# Patient Record
Sex: Female | Born: 1955 | Race: White | Hispanic: No | Marital: Married | Smoking: Never smoker
Health system: Southern US, Community
[De-identification: ages and names within clinical notes are randomized; demographics above are authoritative.]

## PROBLEM LIST (undated history)

## (undated) DIAGNOSIS — N390 Urinary tract infection, site not specified: Secondary | ICD-10-CM

## (undated) DIAGNOSIS — G629 Polyneuropathy, unspecified: Secondary | ICD-10-CM

## (undated) DIAGNOSIS — Z9889 Other specified postprocedural states: Secondary | ICD-10-CM

## (undated) DIAGNOSIS — I499 Cardiac arrhythmia, unspecified: Secondary | ICD-10-CM

## (undated) DIAGNOSIS — N1370 Vesicoureteral-reflux, unspecified: Secondary | ICD-10-CM

## (undated) DIAGNOSIS — R5383 Other fatigue: Secondary | ICD-10-CM

## (undated) DIAGNOSIS — M1990 Unspecified osteoarthritis, unspecified site: Secondary | ICD-10-CM

## (undated) DIAGNOSIS — R000 Tachycardia, unspecified: Secondary | ICD-10-CM

## (undated) DIAGNOSIS — E039 Hypothyroidism, unspecified: Secondary | ICD-10-CM

## (undated) DIAGNOSIS — R0602 Shortness of breath: Secondary | ICD-10-CM

## (undated) DIAGNOSIS — E663 Overweight: Secondary | ICD-10-CM

## (undated) DIAGNOSIS — I10 Essential (primary) hypertension: Secondary | ICD-10-CM

## (undated) DIAGNOSIS — T4145XA Adverse effect of unspecified anesthetic, initial encounter: Secondary | ICD-10-CM

## (undated) DIAGNOSIS — M79606 Pain in leg, unspecified: Secondary | ICD-10-CM

## (undated) DIAGNOSIS — K219 Gastro-esophageal reflux disease without esophagitis: Secondary | ICD-10-CM

## (undated) DIAGNOSIS — T8859XA Other complications of anesthesia, initial encounter: Secondary | ICD-10-CM

## (undated) DIAGNOSIS — R079 Chest pain, unspecified: Secondary | ICD-10-CM

## (undated) DIAGNOSIS — R112 Nausea with vomiting, unspecified: Secondary | ICD-10-CM

## (undated) DIAGNOSIS — R05 Cough: Secondary | ICD-10-CM

## (undated) DIAGNOSIS — E079 Disorder of thyroid, unspecified: Secondary | ICD-10-CM

## (undated) DIAGNOSIS — G479 Sleep disorder, unspecified: Secondary | ICD-10-CM

## (undated) DIAGNOSIS — Z87442 Personal history of urinary calculi: Secondary | ICD-10-CM

## (undated) DIAGNOSIS — G8929 Other chronic pain: Secondary | ICD-10-CM

## (undated) HISTORY — DX: Polyneuropathy, unspecified: G62.9

## (undated) HISTORY — DX: Tachycardia, unspecified: R00.0

## (undated) HISTORY — PX: KNEE SURGERY: SHX244

## (undated) HISTORY — DX: Overweight: E66.3

## (undated) HISTORY — DX: Vesicoureteral-reflux, unspecified: N13.70

## (undated) HISTORY — DX: Cough: R05

## (undated) HISTORY — DX: Chest pain, unspecified: R07.9

## (undated) HISTORY — DX: Other chronic pain: G89.29

## (undated) HISTORY — PX: TONSILLECTOMY: SUR1361

## (undated) HISTORY — DX: Shortness of breath: R06.02

## (undated) HISTORY — DX: Unspecified osteoarthritis, unspecified site: M19.90

## (undated) HISTORY — DX: Urinary tract infection, site not specified: N39.0

## (undated) HISTORY — DX: Other fatigue: R53.83

## (undated) HISTORY — DX: Pain in leg, unspecified: M79.606

## (undated) HISTORY — DX: Sleep disorder, unspecified: G47.9

---

## 1991-01-07 HISTORY — PX: KIDNEY SURGERY: SHX687

## 1992-01-07 HISTORY — PX: URETEROSCOPY VIA URETEROSTOMY: SUR447

## 1992-01-07 HISTORY — PX: OOPHORECTOMY: SHX86

## 1992-01-07 HISTORY — PX: ABDOMINAL SURGERY: SHX537

## 2007-01-07 HISTORY — PX: FOOT SURGERY: SHX648

## 2012-09-30 ENCOUNTER — Ambulatory Visit: Payer: Self-pay | Admitting: Internal Medicine

## 2012-11-09 ENCOUNTER — Ambulatory Visit: Payer: Self-pay | Admitting: Internal Medicine

## 2013-06-06 DIAGNOSIS — R000 Tachycardia, unspecified: Secondary | ICD-10-CM

## 2013-06-06 DIAGNOSIS — R0602 Shortness of breath: Secondary | ICD-10-CM

## 2013-06-06 DIAGNOSIS — G8929 Other chronic pain: Secondary | ICD-10-CM

## 2013-06-06 DIAGNOSIS — R079 Chest pain, unspecified: Secondary | ICD-10-CM

## 2013-06-06 DIAGNOSIS — R058 Other specified cough: Secondary | ICD-10-CM

## 2013-06-06 HISTORY — DX: Other chronic pain: G89.29

## 2013-06-06 HISTORY — DX: Other specified cough: R05.8

## 2013-06-06 HISTORY — DX: Tachycardia, unspecified: R00.0

## 2013-06-06 HISTORY — DX: Shortness of breath: R06.02

## 2013-06-06 HISTORY — DX: Chest pain, unspecified: R07.9

## 2013-06-17 ENCOUNTER — Emergency Department (HOSPITAL_COMMUNITY): Payer: BC Managed Care – PPO

## 2013-06-17 ENCOUNTER — Encounter (HOSPITAL_COMMUNITY): Payer: Self-pay | Admitting: Emergency Medicine

## 2013-06-17 ENCOUNTER — Emergency Department (HOSPITAL_COMMUNITY)
Admission: EM | Admit: 2013-06-17 | Discharge: 2013-06-17 | Disposition: A | Discharge status: Home / Self Care | Payer: BC Managed Care – PPO | Attending: Emergency Medicine | Admitting: Emergency Medicine

## 2013-06-17 DIAGNOSIS — R002 Palpitations: Secondary | ICD-10-CM | POA: Insufficient documentation

## 2013-06-17 DIAGNOSIS — R000 Tachycardia, unspecified: Secondary | ICD-10-CM

## 2013-06-17 DIAGNOSIS — E039 Hypothyroidism, unspecified: Secondary | ICD-10-CM | POA: Insufficient documentation

## 2013-06-17 DIAGNOSIS — Z880 Allergy status to penicillin: Secondary | ICD-10-CM | POA: Insufficient documentation

## 2013-06-17 DIAGNOSIS — Z79899 Other long term (current) drug therapy: Secondary | ICD-10-CM | POA: Insufficient documentation

## 2013-06-17 HISTORY — DX: Disorder of thyroid, unspecified: E07.9

## 2013-06-17 LAB — CBC
HEMATOCRIT: 43 % (ref 36.0–46.0)
HEMOGLOBIN: 14.6 g/dL (ref 12.0–15.0)
MCH: 31.5 pg (ref 26.0–34.0)
MCHC: 34 g/dL (ref 30.0–36.0)
MCV: 92.7 fL (ref 78.0–100.0)
Platelets: 254 10*3/uL (ref 150–400)
RBC: 4.64 MIL/uL (ref 3.87–5.11)
RDW: 12.9 % (ref 11.5–15.5)
WBC: 9.4 10*3/uL (ref 4.0–10.5)

## 2013-06-17 LAB — COMPREHENSIVE METABOLIC PANEL
ALBUMIN: 3.8 g/dL (ref 3.5–5.2)
ALK PHOS: 82 U/L (ref 39–117)
ALT: 26 U/L (ref 0–35)
AST: 25 U/L (ref 0–37)
BUN: 16 mg/dL (ref 6–23)
CHLORIDE: 104 meq/L (ref 96–112)
CO2: 26 mEq/L (ref 19–32)
Calcium: 9.9 mg/dL (ref 8.4–10.5)
Creatinine, Ser: 0.55 mg/dL (ref 0.50–1.10)
GFR calc non Af Amer: >90 mL/min (ref >90)
Glucose, Bld: 99 mg/dL (ref 70–99)
Potassium: 4.2 mEq/L (ref 3.7–5.3)
Sodium: 144 mEq/L (ref 137–147)
Total Bilirubin: 0.3 mg/dL (ref 0.3–1.2)
Total Protein: 7 g/dL (ref 6.0–8.3)

## 2013-06-17 LAB — I-STAT TROPONIN, ED
TROPONIN I, POC: 0 ng/mL (ref 0.00–0.08)
Troponin i, poc: 0 ng/mL (ref 0.00–0.08)

## 2013-06-17 LAB — D-DIMER, QUANTITATIVE: D-Dimer, Quant: <0.27 ug/mL-FEU (ref 0.00–0.48)

## 2013-06-17 MED ORDER — ASPIRIN 81 MG PO CHEW
324.0000 mg | CHEWABLE_TABLET | Freq: Once | ORAL | Status: AC
  Administered 2013-06-17: 324 mg via ORAL
  Filled 2013-06-17: qty 4

## 2013-06-17 NOTE — ED Provider Notes (Addendum)
CSN: [id number]     Arrival date & time 06/17/13  0800 History   First MD Initiated Contact with Patient 06/17/13 [phone]     Chief Complaint  Patient presents with  . Chest Pain     (Consider location/radiation/quality/duration/timing/severity/associated sxs/prior Treatment) HPI  Patient presents to ED for left sided chest pain.  She states she awoke this morning, got up to feed her dog, then went back to sleep around 5:30 am.  After 10-30 minutes of rest, she felt her heart beat very fast with palpitations.  She states it felt uncomfortable, like she just did vigorous exercise.  She then had sharp pain in her left chest lasting 5-10 minutes.  After the pain resolved, she felt continued chest pressure, and shooting left arm pain.  She had some shortness of breath, and non-productive cough.  No diaphoresis or dizziness.  No pain with inspiration.   She has been chronically on estrogen therapy x 6 years, and states at one point her levels were 3 x normal.  She is still currently on treatment.  She notes no unilateral leg swelling, but has chronic leg pain and does not exercise often.  She has no history of HTN, hyperlipidemia.  Her family history is significant for heart disease in her mother and maternal grandfather.  She also notes multiple cousins that had heart attacks before 56 years old.   Past Medical History  Diagnosis Date  . Thyroid disease     hypo   Past Surgical History  Procedure Laterality Date  . Knee surgery    . Foot surgery    . Oophorectomy      left   No family history on file. History  Substance Use Topics  . Smoking status: Never Smoker   . Smokeless tobacco: Not on file  . Alcohol Use: 1.8 oz/week    3 Glasses of wine per week   OB History   Grav Para Term Preterm Abortions TAB SAB Ect Mult Living                 Review of Systems  Ten systems reviewed and are negative for acute change, except as noted in the HPI.    Allergies  Penicillins and Sulfa  antibiotics  Home Medications   Prior to Admission medications   Medication Sig Start Date End Date Taking? Authorizing Provider  cholecalciferol (VITAMIN D) 1000 UNITS tablet Take 2,000 Units by mouth daily.   Yes Historical Provider, MD  ergocalciferol (VITAMIN D2) 50000 UNITS capsule Take 50,000 Units by mouth once a week.   Yes Historical Provider, MD  Estradiol-Estriol-Progesterone (BIEST/PROGESTERONE) CREA Place 1 application onto the skin 2 (two) times daily.   Yes Historical Provider, MD  Flaxseed, Linseed, (FLAXSEED OIL PO) Take 3 capsules by mouth daily.   Yes Historical Provider, MD  ibuprofen (ADVIL,MOTRIN) 200 MG tablet Take 400 mg by mouth every 6 (six) hours as needed for moderate pain.   Yes Historical Provider, MD  levothyroxine (SYNTHROID, LEVOTHROID) 112 MCG tablet Take 112 mcg by mouth daily before breakfast.   Yes Historical Provider, MD  magnesium gluconate (MAGONATE) 500 MG tablet Take 1,000 mg by mouth daily.   Yes Historical Provider, MD  Omega-3 Fatty Acids (FISH OIL PO) Take 2 capsules by mouth daily.   Yes Historical Provider, MD  Progesterone Micronized (PROGESTERONE PO) Take 150 mg by mouth daily.   Yes Historical Provider, MD  Progesterone Micronized 10 % CREA Place 1 application onto the skin daily.  Yes Historical Provider, MD   BP 158/82  Pulse 84  Temp(Src) 97.8 F (36.6 C) (Oral)  Resp 13  SpO2 99% Physical Exam Physical Exam  Nursing note and vitals reviewed. Constitutional: She is oriented to person, place, and time. She appears well-developed and well-nourished. No distress.  HENT:  Head: Normocephalic and atraumatic.  Eyes: Conjunctivae normal and EOM are normal. Pupils are equal, round, and reactive to light. No scleral icterus.  Neck: Normal range of motion.  Cardiovascular: Normal rate, regular rhythm and normal heart sounds.  Exam reveals no gallop and no friction rub. DP/PT pulses intact. No calf swelling or tenderness. Negative  Homan's. No murmur heard. Pulmonary/Chest: Effort normal and breath sounds normal. No respiratory distress.  Abdominal: Soft. Bowel sounds are normal. She exhibits no distension and no mass. There is no tenderness. There is no guarding.  Neurological: She is alert and oriented to person, place, and time.  Skin: Skin is warm and dry. She is not diaphoretic.    ED Course  Procedures (including critical care time) Labs Review Labs Reviewed  CBC  COMPREHENSIVE METABOLIC PANEL  Rosezena SensorI-STAT TROPOININ, ED    Imaging Review Dg Chest 2 View  06/17/2013   CLINICAL DATA:  Left-sided chest pain radiating into the left upper extremity. Tachycardia. Former smoker who quit approximately 30 years ago.  EXAM: CHEST  2 VIEW  COMPARISON:  None.  FINDINGS: Cardiac silhouette normal in size. Thoracic aorta mildly tortuous and elongated. Hilar and mediastinal contours otherwise unremarkable. Mild to moderate central peribronchial thickening. Lungs otherwise clear. No localized airspace consolidation. No pleural effusions. No pneumothorax. Normal pulmonary vascularity. Mild degenerative changes involving the thoracic spine.  IMPRESSION: Mild to moderate changes of bronchitis and/or asthma (which could be acute or chronic) without localized airspace pneumonia.   Electronically Signed   By: Hulan Saashomas  Lawrence M.D.   On: 06/17/2013 09:11     EKG Interpretation   Date/Time:  Friday June 17 2013 08:09:42 EDT Ventricular Rate:  82 PR Interval:  190 QRS Duration: 79 QT Interval:  348 QTC Calculation: 406 R Axis:     Text Interpretation:  Sinus rhythm Baseline wander in lead(s) II III aVR  aVF V6 Confirmed by WARD,  DO, KRISTEN ([id number]) on 06/17/2013 8:23:39 AM      MDM   Final diagnoses:  Racing heart beat    10:15 AM Patient troponin negative. Labs unremarkable. ? SVT. Wells low risk but cannot PERC. D-dimer is pending.   12:12 PM BP 153/52  Pulse 73  Temp(Src) 97.8 F (36.6 C) (Oral)  Resp 11   SpO2 97% Patient with negative d-dimer. 2 negative troponins. Feel that patient may have had a run of svt or other tachyarrhythmia.   Patient is to be discharged with recommendation to follow up with PCP in regards to today's hospital visit. D-dimer negative, VSS, no tracheal deviation, no JVD or new murmur, RRR, breath sounds equal bilaterally, EKG without acute abnormalities, negative troponin, and negative CXR. Return to the ED if CP becomes exertional, associated with diaphoresis or nausea, radiates to left jaw/arm, worsens or becomes concerning in any way. Pt appears reliable for follow up and is agreeable to discharge.   Case has been discussed with and seen by Dr. Elesa MassedWard who agrees with the above plan to discharge.       Arthor Captainbigail Cheyanne Lamison, PA-C 06/17/13 2128  Arthor CaptainAbigail Duron Meister, PA-C 07/12/13 [phone]

## 2013-06-17 NOTE — Discharge Instructions (Signed)
Nonspecific Tachycardia Tachycardia is a faster than normal heartbeat (more than 100 beats per minute). In adults, the heart normally beats between 60 and 100 times a minute. A fast heartbeat may be a normal response to exercise or stress. It does not necessarily mean that something is wrong. However, sometimes when your heart beats too fast it may not be able to pump enough blood to the rest of your body. This can result in chest pain, shortness of breath, dizziness, and even fainting. Nonspecific tachycardia means that the specific cause or pattern of your tachycardia is unknown. CAUSES  Tachycardia may be harmless or it may be due to a more serious underlying cause. Possible causes of tachycardia include:  Exercise or exertion.  Fever.  Pain or injury.  Infection.  Loss of body fluids (dehydration).  Overactive thyroid.  Lack of red blood cells (anemia).  Anxiety and stress.  Alcohol.  Caffeine.  Tobacco products.  Diet pills.  Illegal drugs.  Heart disease. SYMPTOMS  Rapid or irregular heartbeat (palpitations).  Suddenly feeling your heart beating (cardiac awareness).  Dizziness.  Tiredness (fatigue).  Shortness of breath.  Chest pain.  Nausea.  Fainting. DIAGNOSIS  Your caregiver will perform a physical exam and take your medical history. In some cases, a heart specialist (cardiologist) may be consulted. Your caregiver may also order:  Blood tests.  Electrocardiography. This test records the electrical activity of your heart.  A heart monitoring test. TREATMENT  Treatment will depend on the likely cause of your tachycardia. The goal is to treat the underlying cause of your tachycardia. Treatment methods may include:  Replacement of fluids or blood through an intravenous (IV) tube for moderate to severe dehydration or anemia.  New medicines or changes in your current medicines.  Diet and lifestyle changes.  Treatment for certain  infections.  Stress relief or relaxation methods. HOME CARE INSTRUCTIONS   Rest.  Drink enough fluids to keep your urine clear or pale yellow.  Do not smoke.  Avoid:  Caffeine.  Tobacco.  Alcohol.  Chocolate.  Stimulants such as over-the-counter diet pills or pills that help you stay awake.  Situations that cause anxiety or stress.  Illegal drugs such as marijuana, phencyclidine (PCP), and cocaine.  Only take medicine as directed by your caregiver.  Keep all follow-up appointments as directed by your caregiver. SEEK IMMEDIATE MEDICAL CARE IF:   You have pain in your chest, upper arms, jaw, or neck.  You become weak, dizzy, or feel faint.  You have palpitations that will not go away.  You vomit, have diarrhea, or pass blood in your stool.  Your skin is cool, pale, and wet.  You have a fever that will not go away with rest, fluids, and medicine. MAKE SURE YOU:   Understand these instructions.  Will watch your condition.  Will get help right away if you are not doing well or get worse. Document Released: 01/31/2004 Document Revised: 03/17/2011 Document Reviewed: 12/03/2010 [facility] Patient Information 2014 [place]ExitCare, [place].   Holter Monitoring A Holter monitor is a small device with electrodes (small sticky patches) that attach to your chest. It records the electrical activity of your heart and is worn continuously for 24-48 hours.  A HOLTER MONITOR IS USED TO  Detect heart problems such as:  Heart arrhythmia. Is an abnormal or irregular heartbeat. With some heart arrhythmias, you may not feel or know that you have an irregular heart rhythm.  Palpitations, such as feeling your heart racing or fluttering. It is  possible to have heart palpitations and not have a heart arrhythmia.  A heart rhythm that is too slow or too fast.  If you have problems fainting, near fainting or feeling light-headed, a Holter monitor may be worn to see if your heart is the  cause. HOLTER MONITOR PREPARATION   Electrodes will be attached to the skin on your chest.  If you have hair on your chest, small areas may have to be shaved. This is done to help the patches stick better and make the recording more accurate.  The electrodes are attached by wires to the Holter monitor. The Holter monitor clips to your clothing. You will wear the monitor at all times, even while exercising and sleeping. HOME CARE INSTRUCTIONS   Wear your monitor at all times.  The wires and the monitor must stay dry. Do not get the monitor wet.  Do not bathe, swim or use a hot tub with it on.  You may do a "sponge" bath while you have the monitor on.  Keep your skin clean, do not put body lotion or moisturizer on your chest.  It's possible that your skin under the electrodes could become irritated. To keep this from happening, you may put the electrodes in slightly different places on your chest.  Your caregiver will also ask you to keep a diary of your activities, such as walking or doing chores. Be sure to note what you are doing if you experience heart symptoms such as palpitations. This will help your caregiver determine what might be contributing to your symptoms. The information stored in your monitor will be reviewed by your caregiver alongside your diary entries.  Make sure the monitor is safely clipped to your clothing or in a location close to your body that your caregiver recommends.  The monitor and electrodes are removed when the test is over. Return the monitor as directed.  Be sure to follow up with your caregiver and discuss your Holter monitor results. SEEK IMMEDIATE MEDICAL CARE IF:  You faint or feel lightheaded.  You have trouble breathing.  You get pain in your chest, upper arm or jaw.  You feel sick to your stomach and your skin is pale, cool, or damp.  You think something is wrong with the way your heart is beating. MAKE SURE YOU:   Understand these  instructions.  Will watch your condition.  Will get help right away if you are not doing well or get worse. Document Released: 09/21/2003 Document Revised: 03/17/2011 Document Reviewed: 02/03/2008 [facility] Patient Information 2014 [place], [place].

## 2013-06-17 NOTE — ED Notes (Signed)
Pt states that she began having an "arhythmia" while laying in bed this morning at approx 6am. Pt states that she also had sharp pains under her left breast and felt like she could not catch her breath. . Pt states that she was feeling nauseated yesterday. Pt states that symptoms have decreased and now just feels " a presence" of the pain left. Pt also reports having a dry cough since yesterday.

## 2013-06-17 NOTE — ED Notes (Signed)
X-Ray notified pt available for transport.

## 2013-06-20 NOTE — ED Provider Notes (Signed)
Medical screening examination/treatment/procedure(s) were performed by non-physician practitioner and as supervising physician I was immediately available for consultation/collaboration.   EKG Interpretation   Date/Time:  Friday June 17 2013 08:09:42 EDT Ventricular Rate:  82 PR Interval:  190 QRS Duration: 79 QT Interval:  348 QTC Calculation: 406 R Axis:     Text Interpretation:  Sinus rhythm Baseline wander in lead(s) II III aVR  aVF V6 Confirmed by WARD,  DO, KRISTEN ([id number]) on 06/17/2013 8:23:39 AM        Layla MawKristen N Ward, DO 06/20/13 1507

## 2013-07-12 NOTE — ED Provider Notes (Signed)
Medical screening examination/treatment/procedure(s) were performed by non-physician practitioner and as supervising physician I was immediately available for consultation/collaboration.   EKG Interpretation   Date/Time:  Friday June 17 2013 08:09:42 EDT Ventricular Rate:  82 PR Interval:  190 QRS Duration: 79 QT Interval:  348 QTC Calculation: 406 R Axis:     Text Interpretation:  Sinus rhythm Baseline wander in lead(s) II III aVR  aVF V6 Confirmed by WARD,  DO, KRISTEN ([id number]) on 06/17/2013 8:23:39 AM        Layla MawKristen N Ward, DO 07/12/13 [phone]

## 2013-07-19 ENCOUNTER — Ambulatory Visit (INDEPENDENT_AMBULATORY_CARE_PROVIDER_SITE_OTHER): Payer: BC Managed Care – PPO | Admitting: Internal Medicine

## 2013-07-19 ENCOUNTER — Encounter: Payer: Self-pay | Admitting: Internal Medicine

## 2013-07-19 VITALS — BP 150/74 | HR 94 | Ht 67.0 in | Wt 200.0 lb

## 2013-07-19 DIAGNOSIS — R079 Chest pain, unspecified: Secondary | ICD-10-CM

## 2013-07-19 DIAGNOSIS — R0602 Shortness of breath: Secondary | ICD-10-CM

## 2013-07-19 DIAGNOSIS — R002 Palpitations: Secondary | ICD-10-CM

## 2013-07-19 NOTE — Assessment & Plan Note (Signed)
The most likely etiology of her symptoms is PAF. We will have her undergo a 30 day monitor and hopefully will be able to diagnose the mechanism of her symptoms. Because of associated c/p and sob with her symptoms, she will be scheduled for a 2D echo. I will see her back in 6-8 weeks. I have outlined the possible etiology of her symptoms.

## 2013-07-19 NOTE — Patient Instructions (Signed)
Your physician recommends that you schedule a follow-up appointment in: 6-8 weeks with Dr Ladona Ridgelaylor  Your physician has requested that you have an echocardiogram. Echocardiography is a painless test that uses sound waves to create images of your heart. It provides your doctor with information about the size and shape of your heart and how well your heart's chambers and valves are working. This procedure takes approximately one hour. There are no restrictions for this procedure.   Your physician has recommended that you wear an event monitor. Event monitors are medical devices that record the heart's electrical activity. Doctors most often us these monitors to diagnose arrhythmias. Arrhythmias are problems with the speed or rhythm of the heartbeat. The monitor is a small, portable device. You can wear one while you do your normal daily activities. This is usually used to diagnose what is causing palpitations/syncope (passing out).

## 2013-07-19 NOTE — Progress Notes (Signed)
HPI Marissa Larson is referred today for ongoing evaluation of palpitations. She is a pleasant 58 yo woman with a h/o thyroid dysfunction and obesity who has developed symptomatic tachypalpitations. The patient states that she has had a couple of episodes remotely but was in her usual state of health when she awoke with palpitations which she describes as being very irregular, associated with chest pressure and sob. Also arm pain was present. She presented to the ER where her palpitations but not her chest pain were resolved. She was discharge home. Since then she has had a few very brief episodes. Her initial evaluation was negative. She has had no cardiac evaluation recently Her family history is notable for atrial fibrillation and strokes. No syncope. No edema. Her health has otherwise been good. Allergies  Allergen Reactions  . Penicillins Hives and Itching  . Sulfa Antibiotics Hives and Itching    Bactrim     Current Outpatient Prescriptions  Medication Sig Dispense Refill  . cholecalciferol (VITAMIN D) 1000 UNITS tablet Take 2,000 Units by mouth daily.      . ergocalciferol (VITAMIN D2) 50000 UNITS capsule Take 50,000 Units by mouth once a week.      . Estradiol-Estriol-Progesterone (BIEST/PROGESTERONE) CREA Place 1 application onto the skin 2 (two) times daily.      . eszopiclone (LUNESTA) 2 MG TABS tablet as directed.      . Flaxseed, Linseed, (FLAXSEED OIL PO) Take 3 capsules by mouth daily.      Marland Kitchen ibuprofen (ADVIL,MOTRIN) 200 MG tablet Take 400 mg by mouth every 6 (six) hours as needed for moderate pain.      Marland Kitchen levothyroxine (SYNTHROID, LEVOTHROID) 112 MCG tablet Take 112 mcg by mouth daily before breakfast.      . magnesium gluconate (MAGONATE) 500 MG tablet Take 1,000 mg by mouth daily.      . Omega-3 Fatty Acids (FISH OIL PO) Take 2 capsules by mouth daily.      . Progesterone Micronized (PROGESTERONE PO) Take 150 mg by mouth daily.      . Progesterone Micronized 10 % CREA  Place 1 application onto the skin daily.       No current facility-administered medications for this visit.     Past Medical History  Diagnosis Date  . Thyroid disease     hypo  . Hiatal hernia   . UTI (lower urinary tract infection)   . Arthritis   . Fatigue   . Sleep difficulties     Change in sleep patterns  . Tachycardia 06/2013  . Chest pain 06/2013    Left sided  . SOB (shortness of breath) 06/2013  . Chronic leg pain 06/2013  . Nonproductive cough 06/2013    ROS:   All systems reviewed and negative except as noted in the HPI.   Past Surgical History  Procedure Laterality Date  . Knee surgery      Multiple knee surgeries  . Foot surgery Right 2009  . Oophorectomy Left 1994  . Abdominal surgery  1994  . Kidney surgery Left 1993     Family History  Problem Relation Age of Onset  . Migraines Other     Fam Hx   . Heart disease Other     Fam Hx  . Thyroid disease Other     Fam Hx - She also has thyroid disease  . Diabetes Other     Fam Hx  . Arthritis Other     Fam Hx  .  Gallbladder disease Other     Fam Hx  . Cancer Other     Fam Hx - Pancreatic  . Hypertension Other     Fam Hx  . Heart attack Cousin     Multiple cousins with MI's before 56 years of age  . Heart disease Mother   . Heart disease Maternal Grandmother      History   Social History  . Marital Status: Married    Spouse Name: N/A    Number of Children: N/A  . Years of Education: N/A   Occupational History  . Not on file.   Social History Main Topics  . Smoking status: Never Smoker   . Smokeless tobacco: Not on file  . Alcohol Use: 1.8 oz/week    3 Glasses of wine per week  . Drug Use: No  . Sexual Activity: Not on file   Other Topics Concern  . Not on file   Social History Narrative  . No narrative on file     BP 150/74  Pulse 94  Ht 5\' 7"  (1.702 m)  Wt 200 lb (90.719 kg)  BMI 31.32 kg/m2  Physical Exam:  Well appearing obese, middle aged woman, NAD HEENT:  Unremarkable Neck:  No JVD, no thyromegally Back:  No CVA tenderness Lungs:  Clear with no wheezes, rales, or rhonchi. HEART:  Regular rate rhythm, no murmurs, no rubs, no clicks Abd:  soft, positive bowel sounds, no organomegally, no rebound, no guarding Ext:  2 plus pulses, no edema, no cyanosis, no clubbing Skin:  No rashes no nodules Neuro:  CN II through XII intact, motor grossly intact  EKG - nsr  Assess/Plan:

## 2013-07-20 ENCOUNTER — Telehealth: Payer: Self-pay | Admitting: Internal Medicine

## 2013-07-20 ENCOUNTER — Encounter: Payer: Self-pay | Admitting: *Deleted

## 2013-07-20 ENCOUNTER — Encounter (INDEPENDENT_AMBULATORY_CARE_PROVIDER_SITE_OTHER): Payer: BC Managed Care – PPO

## 2013-07-20 DIAGNOSIS — R002 Palpitations: Secondary | ICD-10-CM

## 2013-07-20 NOTE — Telephone Encounter (Signed)
Walk In pt Form " Sealed Envelope" [facility]

## 2013-07-20 NOTE — Progress Notes (Signed)
Patient ID: Marissa Larson, female   DOB: 12-13-1955, 58 y.o.   MRN: [id number] E-Cardio verite 30 day cardiac event monitor applied to patient.

## 2013-07-21 ENCOUNTER — Ambulatory Visit (HOSPITAL_COMMUNITY)
Admission: RE | Admit: 2013-07-21 | Discharge: 2013-07-21 | Disposition: A | Discharge status: Home / Self Care | Payer: BC Managed Care – PPO | Source: Ambulatory Visit | Attending: Cardiology | Admitting: Cardiology

## 2013-07-21 DIAGNOSIS — R002 Palpitations: Secondary | ICD-10-CM | POA: Insufficient documentation

## 2013-07-21 DIAGNOSIS — I059 Rheumatic mitral valve disease, unspecified: Secondary | ICD-10-CM

## 2013-07-21 DIAGNOSIS — R079 Chest pain, unspecified: Secondary | ICD-10-CM

## 2013-07-21 DIAGNOSIS — R0602 Shortness of breath: Secondary | ICD-10-CM

## 2013-07-21 NOTE — Progress Notes (Signed)
2D Echo Performed 07/21/2013    Loyce Klasen, RCS  

## 2013-08-12 ENCOUNTER — Encounter: Payer: BC Managed Care – PPO | Admitting: Cardiology

## 2013-08-30 ENCOUNTER — Encounter: Payer: Self-pay | Admitting: Internal Medicine

## 2013-08-30 ENCOUNTER — Ambulatory Visit (INDEPENDENT_AMBULATORY_CARE_PROVIDER_SITE_OTHER): Payer: BC Managed Care – PPO | Admitting: Internal Medicine

## 2013-08-30 VITALS — BP 132/78 | HR 81 | Ht 67.0 in | Wt 200.4 lb

## 2013-08-30 DIAGNOSIS — R002 Palpitations: Secondary | ICD-10-CM

## 2013-08-30 NOTE — Patient Instructions (Signed)
Your physician recommends that you schedule a follow-up appointment in: 3 months with Dr Ladona Ridgel  Will call us back at [phone] in 4 weeks to report how you are doing  Will start Flecainide  twice daily if symptoms are still persistent

## 2013-08-30 NOTE — Assessment & Plan Note (Signed)
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 We discussed the treatment options in detail. She does note a lack of sleep, and increase stress. We discussed initiation of calcium channel blockers, beta blockers, or flecainide. After careful review of the side effects of each medication, she will undergo a period of watchful waiting while she is trying to adjust first child life, and if she has recurrent palpitations, she will initiate flecainide therapy, initially at 50 mg twice daily, with up titration thereafter.

## 2013-08-30 NOTE — Progress Notes (Signed)
HPI Marissa Larson returns today for followup. She is a very pleasant 58 year old woman with a history of fatigue and malaise and palpitations. When I saw the patient several weeks ago, I recommended that she undergo 2-D echocardiography, as well as cardiac monitoring. She was found to have preserved left ventricular systolic function. On cardiac monitoring, she was found to have frequent PVCs, as well as nonsustained ventricular tachycardia. Her symptoms persist. We discussed the possible contributing factors, she notes that she has poor sleep hygiene, and will frequently have very little if any sleep. She has frequent palpitations. She denies anginal symptoms. She states that her thyroid functions have been evaluated, and found to be unremarkable. Allergies  Allergen Reactions  . Penicillins Hives and Itching  . Sulfa Antibiotics Hives and Itching    Bactrim     Current Outpatient Prescriptions  Medication Sig Dispense Refill  . cholecalciferol (VITAMIN D) 1000 UNITS tablet Take 2,000 Units by mouth daily.      . ergocalciferol (VITAMIN D2) 50000 UNITS capsule Take 50,000 Units by mouth once a week. Takes on Sunday      . Estradiol-Estriol-Progesterone (BIEST/PROGESTERONE) CREA Place 1 application onto the skin 2 (two) times daily.      . eszopiclone (LUNESTA) 2 MG TABS tablet Take 2 mg by mouth at bedtime as needed for sleep.       . Flaxseed, Linseed, (FLAXSEED OIL PO) Take 3 capsules by mouth daily.      Marland Kitchen ibuprofen (ADVIL,MOTRIN) 200 MG tablet Take 400 mg by mouth every 6 (six) hours as needed for moderate pain.      Marland Kitchen levothyroxine (SYNTHROID, LEVOTHROID) 112 MCG tablet Take 112 mcg by mouth daily before breakfast.      . magnesium gluconate (MAGONATE) 500 MG tablet Take 1,000 mg by mouth daily.      . Omega-3 Fatty Acids (FISH OIL PO) Take 2 capsules by mouth daily.      . Progesterone Micronized (PROGESTERONE PO) Take 150 mg by mouth daily.      . Progesterone Micronized 10 %  CREA Place 1 application onto the skin daily.       No current facility-administered medications for this visit.     Past Medical History  Diagnosis Date  . Thyroid disease     hypo  . Hiatal hernia   . UTI (lower urinary tract infection)   . Arthritis   . Fatigue   . Sleep difficulties     Change in sleep patterns  . Tachycardia 06/2013  . Chest pain 06/2013    Left sided  . SOB (shortness of breath) 06/2013  . Chronic leg pain 06/2013  . Nonproductive cough 06/2013    ROS:   All systems reviewed and negative except as noted in the HPI.   Past Surgical History  Procedure Laterality Date  . Knee surgery      Multiple knee surgeries  . Foot surgery Right 2009  . Oophorectomy Left 1994  . Abdominal surgery  1994  . Kidney surgery Left 1993     Family History  Problem Relation Age of Onset  . Migraines Other     Fam Hx   . Heart disease Other     Fam Hx  . Thyroid disease Other     Fam Hx - She also has thyroid disease  . Diabetes Other     Fam Hx  . Arthritis Other     Fam Hx  . Gallbladder disease  Other     Fam Hx  . Cancer Other     Fam Hx - Pancreatic  . Hypertension Other     Fam Hx  . Heart attack Cousin     Multiple cousins with MI's before 37 years of age  . Heart disease Mother   . Heart disease Maternal Grandmother      History   Social History  . Marital Status: Married    Spouse Name: N/A    Number of Children: N/A  . Years of Education: N/A   Occupational History  . Not on file.   Social History Main Topics  . Smoking status: Never Smoker   . Smokeless tobacco: Not on file  . Alcohol Use: 1.8 oz/week    3 Glasses of wine per week  . Drug Use: No  . Sexual Activity: Not on file   Other Topics Concern  . Not on file   Social History Narrative  . No narrative on file     BP 132/78  Pulse 81  Ht  (1.702 m)  Wt 200 lb 6.4 oz (90.901 kg)  BMI 31.38 kg/m2  Physical Exam:  Well appearing 58 year old woman,  NAD HEENT: Unremarkable Neck:  No JVD, no thyromegally Back:  No CVA tenderness Lungs:  Clear with no wheezes, rales, or rhonchi. HEART:  Regular rate rhythm, no murmurs, no rubs, no clicks Abd:  soft, positive bowel sounds, no organomegally, no rebound, no guarding Ext:  2 plus pulses, no edema, no cyanosis, no clubbing Skin:  No rashes no nodules Neuro:  CN II through XII intact, motor grossly intact  Cardiac monitoring - normal sinus rhythm with PVCs, and nonsustained VT.  DEVICE  Normal device function.  See PaceArt for details.   Assess/Plan:

## 2013-08-31 ENCOUNTER — Telehealth: Payer: Self-pay | Admitting: Internal Medicine

## 2013-08-31 NOTE — Telephone Encounter (Signed)
New message     Talk to a nurse regarding yesterday's appointment

## 2013-08-31 NOTE — Telephone Encounter (Addendum)
Spoke with patient and she still has a lot of questions for Dr Ladona Ridgel.  I will set up an appointment with Dr Ladona Ridgel so she and her husband can come and have their questions answered

## 2013-09-15 ENCOUNTER — Ambulatory Visit: Payer: BC Managed Care – PPO | Admitting: Internal Medicine

## 2013-09-20 ENCOUNTER — Encounter: Payer: Self-pay | Admitting: Internal Medicine

## 2013-09-20 ENCOUNTER — Ambulatory Visit: Payer: BC Managed Care – PPO | Admitting: Internal Medicine

## 2013-09-20 ENCOUNTER — Ambulatory Visit (INDEPENDENT_AMBULATORY_CARE_PROVIDER_SITE_OTHER): Payer: BC Managed Care – PPO | Admitting: Internal Medicine

## 2013-09-20 VITALS — BP 148/84 | HR 70 | Ht 67.0 in | Wt 202.4 lb

## 2013-09-20 DIAGNOSIS — R002 Palpitations: Secondary | ICD-10-CM

## 2013-09-20 MED ORDER — METOPROLOL TARTRATE 25 MG PO TABS: 25.0000 mg | ORAL_TABLET | Freq: Two times a day (BID) | ORAL | Status: DC

## 2013-09-20 NOTE — Progress Notes (Signed)
HPI Marissa Larson returns today to discuss treatment of her palpitations and medical therapy. She has symptomatic PVC's and obesity in the setting of normal LV function. She is quite anxious and has had trouble sleeping.   Allergies  Allergen Reactions  . Penicillins Hives and Itching  . Sulfa Antibiotics Hives and Itching    Bactrim     Current Outpatient Prescriptions  Medication Sig Dispense Refill  . cholecalciferol (VITAMIN D) 1000 UNITS tablet Take 2,000 Units by mouth daily.      . ergocalciferol (VITAMIN D2) 50000 UNITS capsule Take 50,000 Units by mouth once a week. Takes on Sunday      . Estradiol-Estriol-Progesterone (BIEST/PROGESTERONE) CREA Place 1 application onto the skin 2 (two) times daily.      . eszopiclone (LUNESTA) 2 MG TABS tablet Take 2 mg by mouth at bedtime as needed for sleep.       . Flaxseed, Linseed, (FLAXSEED OIL PO) Take 3 capsules by mouth daily.      Marland Kitchen ibuprofen (ADVIL,MOTRIN) 200 MG tablet Take 400 mg by mouth every 6 (six) hours as needed for moderate pain.      Marland Kitchen levothyroxine (SYNTHROID, LEVOTHROID) 112 MCG tablet Take 112 mcg by mouth daily before breakfast.      . magnesium gluconate (MAGONATE) 500 MG tablet Take 1,000 mg by mouth daily.      . Melatonin 10 MG CAPS Take 1 capsule by mouth at bedtime.      . Omega-3 Fatty Acids (FISH OIL PO) Take 2 capsules by mouth daily.      . Progesterone Micronized (PROGESTERONE PO) Take 150 mg by mouth daily.      . Progesterone Micronized 10 % CREA Place 1 application onto the skin daily.       No current facility-administered medications for this visit.     Past Medical History  Diagnosis Date  . Thyroid disease     hypo  . Hiatal hernia   . UTI (lower urinary tract infection)   . Arthritis   . Fatigue   . Sleep difficulties     Change in sleep patterns  . Tachycardia 06/2013  . Chest pain 06/2013    Left sided  . SOB (shortness of breath) 06/2013  . Chronic leg pain 06/2013  .  Nonproductive cough 06/2013    ROS:   All systems reviewed and negative except as noted in the HPI.   Past Surgical History  Procedure Laterality Date  . Knee surgery      Multiple knee surgeries  . Foot surgery Right 2009  . Oophorectomy Left 1994  . Abdominal surgery  1994  . Kidney surgery Left 1993     Family History  Problem Relation Age of Onset  . Migraines Other     Fam Hx   . Heart disease Other     Fam Hx  . Thyroid disease Other     Fam Hx - She also has thyroid disease  . Diabetes Other     Fam Hx  . Arthritis Other     Fam Hx  . Gallbladder disease Other     Fam Hx  . Cancer Other     Fam Hx - Pancreatic  . Hypertension Other     Fam Hx  . Heart attack Cousin     Multiple cousins with MI's before 21 years of age  . Heart disease Mother   . Heart disease Maternal Grandmother  History   Social History  . Marital Status: Married    Spouse Name: N/A    Number of Children: N/A  . Years of Education: N/A   Occupational History  . Not on file.   Social History Main Topics  . Smoking status: Never Smoker   . Smokeless tobacco: Not on file  . Alcohol Use: 1.8 oz/week    3 Glasses of wine per week  . Drug Use: No  . Sexual Activity: Not on file   Other Topics Concern  . Not on file   Social History Narrative  . No narrative on file     BP 148/84  Pulse 70  Ht  (1.702 m)  Wt 202 lb 6.4 oz (91.808 kg)  BMI 31.69 kg/m2  Physical Exam:  Well appearing but anxious, NAD HEENT: Unremarkable Neck:  No JVD, no thyromegally Back:  No CVA tenderness Lungs:  Clear with no wheezes HEART:  Regular rate rhythm, no murmurs, no rubs, no clicks Abd:  soft, positive bowel sounds, no organomegally, no rebound, no guarding Ext:  2 plus pulses, no edema, no cyanosis, no clubbing Skin:  No rashes no nodules Neuro:  CN II through XII intact, motor grossly intact  EKG - nsr   Assess/Plan:

## 2013-09-20 NOTE — Patient Instructions (Signed)
Your physician recommends that you schedule a follow-up appointment in: 6 weeks with Dr Ladona Ridgel  Your physician has recommended you make the following change in your medication:  1) Start Metoprolol  twice daily

## 2013-09-21 ENCOUNTER — Encounter: Payer: Self-pay | Admitting: Internal Medicine

## 2013-09-21 NOTE — Assessment & Plan Note (Addendum)
The patient and her husband have had many questions regarding the treatment of her palpitations. I have carefully outlined the options. One, do nothing. Two, start a beta blocker, and three anti-arrhythmic drug therapy with flecainide. When I saw the patient last, I recommended flecainide but she was unsure. At some point, we will need to quantify her total number of PVC's in a 24 hour period. After much reflection, she would like to try flecainide.

## 2013-11-07 ENCOUNTER — Ambulatory Visit: Payer: BC Managed Care – PPO | Admitting: Internal Medicine

## 2013-11-30 ENCOUNTER — Ambulatory Visit: Payer: BC Managed Care – PPO | Admitting: Internal Medicine

## 2013-12-05 ENCOUNTER — Ambulatory Visit: Payer: BC Managed Care – PPO | Admitting: Internal Medicine

## 2014-01-18 ENCOUNTER — Encounter: Payer: Self-pay | Admitting: Internal Medicine

## 2014-01-18 ENCOUNTER — Ambulatory Visit (INDEPENDENT_AMBULATORY_CARE_PROVIDER_SITE_OTHER): Payer: BLUE CROSS/BLUE SHIELD | Admitting: Internal Medicine

## 2014-01-18 VITALS — BP 136/90 | HR 76 | Ht 67.0 in | Wt 198.2 lb

## 2014-01-18 DIAGNOSIS — R002 Palpitations: Secondary | ICD-10-CM

## 2014-01-18 DIAGNOSIS — E663 Overweight: Secondary | ICD-10-CM

## 2014-01-18 HISTORY — DX: Overweight: E66.3

## 2014-01-18 NOTE — Progress Notes (Signed)
HPI Mrs. Marissa Larson returns today to discuss treatment of her palpitations and medical therapy. She has symptomatic PVC's and obesity in the setting of normal LV function. She is quite anxious and has had trouble sleeping.  Since I last saw her she has done well. She denies chest pain or sob. Her palpitations have nearly resolved. Allergies  Allergen Reactions  . Penicillins Hives and Itching  . Sulfa Antibiotics Hives and Itching    Bactrim     Current Outpatient Prescriptions  Medication Sig Dispense Refill  . cholecalciferol (VITAMIN D) 1000 UNITS tablet Take 2,000 Units by mouth daily.    . ergocalciferol (VITAMIN D2) 50000 UNITS capsule Take 50,000 Units by mouth once a week. Takes on Sunday    . Estradiol-Estriol-Progesterone (BIEST/PROGESTERONE) CREA Place 1 application onto the skin 2 (two) times daily.    . eszopiclone (LUNESTA) 2 MG TABS tablet Take 2 mg by mouth at bedtime as needed for sleep.     . Flaxseed, Linseed, (FLAXSEED OIL PO) Take 3 capsules by mouth daily.    Marland Kitchen ibuprofen (ADVIL,MOTRIN) 200 MG tablet Take 400 mg by mouth every 6 (six) hours as needed for moderate pain.    Marland Kitchen levothyroxine (SYNTHROID, LEVOTHROID) 112 MCG tablet Take 112 mcg by mouth daily before breakfast.    . magnesium gluconate (MAGONATE) 500 MG tablet Take 1,000 mg by mouth daily.    . Melatonin 10 MG CAPS Take 1 capsule by mouth at bedtime.    . metoprolol tartrate (LOPRESSOR) 25 MG tablet Take 1 tablet (25 mg total) by mouth 2 (two) times daily. 180 tablet 3  . Omega-3 Fatty Acids (FISH OIL PO) Take 2 capsules by mouth daily.    . Progesterone Micronized (PROGESTERONE PO) Take 150 mg by mouth daily.    . Progesterone Micronized 10 % CREA Place 1 application onto the skin daily.     No current facility-administered medications for this visit.     Past Medical History  Diagnosis Date  . Thyroid disease     hypo  . Hiatal hernia   . UTI (lower urinary tract infection)   . Arthritis   .  Fatigue   . Sleep difficulties     Change in sleep patterns  . Tachycardia 06/2013  . Chest pain 06/2013    Left sided  . SOB (shortness of breath) 06/2013  . Chronic leg pain 06/2013  . Nonproductive cough 06/2013    ROS:   All systems reviewed and negative except as noted in the HPI.   Past Surgical History  Procedure Laterality Date  . Knee surgery      Multiple knee surgeries  . Foot surgery Right 2009  . Oophorectomy Left 1994  . Abdominal surgery  1994  . Kidney surgery Left 1993     Family History  Problem Relation Age of Onset  . Migraines Other     Fam Hx   . Heart disease Other     Fam Hx  . Thyroid disease Other     Fam Hx - She also has thyroid disease  . Diabetes Other     Fam Hx  . Arthritis Other     Fam Hx  . Gallbladder disease Other     Fam Hx  . Cancer Other     Fam Hx - Pancreatic  . Hypertension Other     Fam Hx  . Heart attack Cousin     Multiple cousins with MI's before 67  years of age  . Heart disease Mother   . Heart disease Maternal Grandmother      History   Social History  . Marital Status: Married    Spouse Name: N/A    Number of Children: N/A  . Years of Education: N/A   Occupational History  . Not on file.   Social History Main Topics  . Smoking status: Never Smoker   . Smokeless tobacco: Not on file  . Alcohol Use: 1.8 oz/week    3 Glasses of wine per week  . Drug Use: No  . Sexual Activity: Not on file   Other Topics Concern  . Not on file   Social History Narrative  . No narrative on file     There were no vitals taken for this visit.  Physical Exam:  Well appearing but less anxious, middle aged woman, NAD HEENT: Unremarkable Neck:  No JVD, no thyromegally Back:  No CVA tenderness Lungs:  Clear with no wheezes HEART:  Regular rate rhythm, no murmurs, no rubs, no clicks Abd:  soft, positive bowel sounds, no organomegally, no rebound, no guarding Ext:  2 plus pulses, no edema, no cyanosis, no  clubbing Skin:  No rashes no nodules Neuro:  CN II through XII intact, motor grossly intact  EKG - nsr   Assess/Plan:

## 2014-01-18 NOTE — Patient Instructions (Signed)
Your physician recommends that you continue on your current medications as directed. Please refer to the Current Medication list given to you today.  Follow up with Dr. Taylor as needed. 

## 2014-01-18 NOTE — Assessment & Plan Note (Signed)
She is encouraged to exercise and eat less.

## 2014-01-18 NOTE — Assessment & Plan Note (Signed)
She is much improved despite not taking any medications. She has tried to reduce her stress and consume less caffeine. Will plan to see her back on an as needed basis. We discussed the indications for taking beta blockers or flecainide.

## 2014-06-06 ENCOUNTER — Ambulatory Visit: Payer: Self-pay | Admitting: Podiatry

## 2014-06-15 ENCOUNTER — Ambulatory Visit: Payer: Self-pay | Admitting: Podiatry

## 2014-12-06 LAB — HM PAP SMEAR: HM PAP: NEGATIVE

## 2014-12-12 LAB — HM MAMMOGRAPHY

## 2014-12-29 ENCOUNTER — Ambulatory Visit: Payer: BLUE CROSS/BLUE SHIELD | Admitting: Internal Medicine

## 2015-01-25 DIAGNOSIS — H2513 Age-related nuclear cataract, bilateral: Secondary | ICD-10-CM | POA: Insufficient documentation

## 2015-01-25 DIAGNOSIS — H43812 Vitreous degeneration, left eye: Secondary | ICD-10-CM | POA: Insufficient documentation

## 2015-02-08 ENCOUNTER — Other Ambulatory Visit: Payer: Self-pay | Admitting: Gastroenterology

## 2015-02-08 DIAGNOSIS — R1011 Right upper quadrant pain: Secondary | ICD-10-CM

## 2015-02-08 DIAGNOSIS — R1012 Left upper quadrant pain: Secondary | ICD-10-CM

## 2015-02-12 LAB — HM COLONOSCOPY

## 2015-02-13 ENCOUNTER — Ambulatory Visit
Admission: RE | Admit: 2015-02-13 | Discharge: 2015-02-13 | Disposition: A | Discharge status: Home / Self Care | Payer: BLUE CROSS/BLUE SHIELD | Source: Ambulatory Visit | Attending: Gastroenterology | Admitting: Gastroenterology

## 2015-02-13 DIAGNOSIS — R1011 Right upper quadrant pain: Secondary | ICD-10-CM

## 2015-02-13 DIAGNOSIS — R1012 Left upper quadrant pain: Secondary | ICD-10-CM

## 2015-04-27 LAB — TSH: TSH: 0.75 u[IU]/mL (ref <=5.90)

## 2015-04-27 LAB — T4, FREE: Free T4: 1.55

## 2015-05-04 LAB — HEPATIC FUNCTION PANEL
ALT: 29 U/L (ref 7–35)
AST: 30 U/L (ref 13–35)
Alkaline Phosphatase: 88 U/L (ref 25–125)
Bilirubin, Total: 0.3 mg/dL

## 2015-05-04 LAB — BILIRUBIN, DIRECT: Bilirubin, Direct: 0.09 mg/dL (ref 0.01–0.4)

## 2015-05-04 LAB — PROTEIN, TOTAL: Total Protein: 6.5 g/dL

## 2015-05-04 LAB — ALBUMIN: Albumin: 4.3

## 2015-09-19 ENCOUNTER — Ambulatory Visit (HOSPITAL_COMMUNITY)
Admission: EM | Admit: 2015-09-19 | Discharge: 2015-09-19 | Disposition: A | Discharge status: Home / Self Care | Payer: BLUE CROSS/BLUE SHIELD | Attending: Internal Medicine | Admitting: Internal Medicine

## 2015-09-19 ENCOUNTER — Encounter (HOSPITAL_COMMUNITY): Payer: Self-pay | Admitting: Emergency Medicine

## 2015-09-19 DIAGNOSIS — J069 Acute upper respiratory infection, unspecified: Secondary | ICD-10-CM | POA: Diagnosis not present

## 2015-09-19 MED ORDER — AZITHROMYCIN 250 MG PO TABS: ORAL_TABLET | ORAL | 0 refills | Status: DC

## 2015-09-19 MED ORDER — HYDROCODONE-ACETAMINOPHEN 7.5-325 MG/15ML PO SOLN
10.0000 mL | Freq: Four times a day (QID) | ORAL | 0 refills | Status: DC | PRN
Start: 2015-09-19 — End: 2015-10-09

## 2015-09-19 NOTE — ED Provider Notes (Signed)
CSN: [id number]     Arrival date & time 09/19/15  1451 History   First MD Initiated Contact with Patient 09/19/15 1642     No chief complaint on file.  (Consider location/radiation/quality/duration/timing/severity/associated sxs/prior Treatment) HPI 60 y/o female with 10 day hx of dry hacking cough without improvement. Keeping her awake at night despite using OTC cough meds.  Past Medical History:  Diagnosis Date  . Arthritis   . Chest pain 06/2013   Left sided  . Chronic leg pain 06/2013  . Fatigue   . Hiatal hernia   . Nonproductive cough 06/2013  . Sleep difficulties    Change in sleep patterns  . SOB (shortness of breath) 06/2013  . Tachycardia 06/2013  . Thyroid disease    hypo  . UTI (lower urinary tract infection)    Past Surgical History:  Procedure Laterality Date  . ABDOMINAL SURGERY  1994  . FOOT SURGERY Right 2009  . KIDNEY SURGERY Left 1993  . KNEE SURGERY     Multiple knee surgeries  . OOPHORECTOMY Left 1994   Family History  Problem Relation Age of Onset  . Migraines Other     Fam Hx   . Heart disease Other     Fam Hx  . Thyroid disease Other     Fam Hx - She also has thyroid disease  . Diabetes Other     Fam Hx  . Arthritis Other     Fam Hx  . Gallbladder disease Other     Fam Hx  . Cancer Other     Fam Hx - Pancreatic  . Hypertension Other     Fam Hx  . Heart attack Cousin     Multiple cousins with MI's before 54 years of age  . Heart disease Mother   . Heart disease Maternal Grandmother    Social History  Substance Use Topics  . Smoking status: Never Smoker  . Smokeless tobacco: Not on file  . Alcohol use 1.8 oz/week    3 Glasses of wine per week   OB History    No data available     Review of Systems  Denies: HEADACHE, NAUSEA, ABDOMINAL PAIN, CHEST PAIN, CONGESTION, DYSURIA, SHORTNESS OF BREATH  Allergies  Penicillins and Sulfa antibiotics  Home Medications   Prior to Admission medications   Medication Sig Start Date End Date  Taking? Authorizing Provider  allopurinol (ZYLOPRIM) 100 MG tablet Take 1 tablet by mouth daily. 12/13/13   Historical Provider, MD  ergocalciferol (VITAMIN D2) 50000 UNITS capsule Take 50,000 Units by mouth once a week. Takes on Sunday    Historical Provider, MD  Estradiol-Estriol-Progesterone (BIEST/PROGESTERONE) CREA Place 1 application onto the skin 2 (two) times daily.    Historical Provider, MD  eszopiclone (LUNESTA) 2 MG TABS tablet Take 2 mg by mouth at bedtime as needed for sleep.  06/09/13   Historical Provider, MD  Flaxseed, Linseed, (FLAXSEED OIL PO) Take 3 capsules by mouth daily.    Historical Provider, MD  hydrochlorothiazide (HYDRODIURIL) 25 MG tablet Take 12.5 mg by mouth daily. 12/13/13   Historical Provider, MD  ibuprofen (ADVIL,MOTRIN) 200 MG tablet Take 400 mg by mouth every 6 (six) hours as needed for moderate pain.    Historical Provider, MD  levothyroxine (SYNTHROID, LEVOTHROID) 112 MCG tablet Take 112 mcg by mouth daily before breakfast.    Historical Provider, MD  magnesium gluconate (MAGONATE) 500 MG tablet Take 1,000 mg by mouth daily.    Historical Provider, MD  Melatonin 10 MG CAPS Take 1 capsule by mouth at bedtime.    Historical Provider, MD  metoprolol tartrate (LOPRESSOR) 25 MG tablet Take 1 tablet (25 mg total) by mouth 2 (two) times daily. Patient not taking: Reported on 01/18/2014 09/20/13   Marinus MawGregg W Taylor, MD  Omega-3 Fatty Acids (FISH OIL PO) Take 2 capsules by mouth daily.    Historical Provider, MD  Progesterone Micronized (PROGESTERONE PO) Take 150 mg by mouth daily.    Historical Provider, MD  Progesterone Micronized 10 % CREA Place 1 application onto the skin daily.    Historical Provider, MD   Meds Ordered and Administered this Visit  Medications - No data to display  BP 159/79 (BP Location: Right Arm)   Pulse 77   Temp 98.7 F (37.1 C) (Oral)   Resp 18   Ht 5\' 6"  (1.676 m)   Wt 195 lb (88.5 kg)   SpO2 98%   BMI 31.47 kg/m  No data  found.   Physical Exam NURSES NOTES AND VITAL SIGNS REVIEWED. CONSTITUTIONAL: Well developed, well nourished, no acute distress HEENT: normocephalic, atraumatic EYES: Conjunctiva normal NECK:normal ROM, supple, no adenopathy PULMONARY:No respiratory distress, normal effort ABDOMINAL: Soft, ND, NT BS+, No CVAT MUSCULOSKELETAL: Normal ROM of all extremities,  SKIN: warm and dry without rash PSYCHIATRIC: Mood and affect, behavior are normal  Urgent Care Course   Clinical Course    Procedures (including critical care time)  Labs Review Labs Reviewed - No data to display  Imaging Review No results found.   Visual Acuity Review  Right Eye Distance:   Left Eye Distance:   Bilateral Distance:    Right Eye Near:   Left Eye Near:    Bilateral Near:         MDM   1. Acute upper respiratory infection    NURSES NOTES AND VITAL SIGNS REVIEWED. CONSTITUTIONAL: Well developed, well nourished, no acute distress HEENT: normocephalic, atraumatic, THROAT IS CLEAR EYES: Conjunctiva normal NECK:normal ROM, supple, no adenopathy PULMONARY:No respiratory distress, normal effort, NO WHEEZES NOTED, NO CONSOLIDATION ABDOMINAL: Soft, ND, NT BS+, No CVAT MUSCULOSKELETAL: Normal ROM of all extremities,  SKIN: warm and dry without rash PSYCHIATRIC: Mood and affect, behavior are normal     Tharon AquasFrank C Artis Beggs, [place] 09/19/15 1722

## 2015-09-19 NOTE — ED Triage Notes (Signed)
[facility] a [facility] with chest soreness and fatigue x 10 days.

## 2015-10-09 ENCOUNTER — Ambulatory Visit (INDEPENDENT_AMBULATORY_CARE_PROVIDER_SITE_OTHER): Payer: BLUE CROSS/BLUE SHIELD | Admitting: Family Medicine

## 2015-10-09 ENCOUNTER — Encounter: Payer: Self-pay | Admitting: Family Medicine

## 2015-10-09 VITALS — BP 144/84 | HR 73 | Ht 67.0 in | Wt 192.4 lb

## 2015-10-09 DIAGNOSIS — E559 Vitamin D deficiency, unspecified: Secondary | ICD-10-CM

## 2015-10-09 DIAGNOSIS — I10 Essential (primary) hypertension: Secondary | ICD-10-CM

## 2015-10-09 DIAGNOSIS — E663 Overweight: Secondary | ICD-10-CM

## 2015-10-09 DIAGNOSIS — Z7189 Other specified counseling: Secondary | ICD-10-CM | POA: Diagnosis not present

## 2015-10-09 DIAGNOSIS — G479 Sleep disorder, unspecified: Secondary | ICD-10-CM

## 2015-10-09 DIAGNOSIS — K259 Gastric ulcer, unspecified as acute or chronic, without hemorrhage or perforation: Secondary | ICD-10-CM | POA: Insufficient documentation

## 2015-10-09 DIAGNOSIS — M159 Polyosteoarthritis, unspecified: Secondary | ICD-10-CM | POA: Diagnosis not present

## 2015-10-09 DIAGNOSIS — R5383 Other fatigue: Secondary | ICD-10-CM | POA: Diagnosis not present

## 2015-10-09 DIAGNOSIS — M1189 Other specified crystal arthropathies, multiple sites: Secondary | ICD-10-CM

## 2015-10-09 DIAGNOSIS — E038 Other specified hypothyroidism: Secondary | ICD-10-CM

## 2015-10-09 NOTE — Progress Notes (Signed)
New patient office visit note:  Impression and Recommendations:    1. Overweight   2. Counseling on health promotion and disease prevention   3. Generalized OA   4. Other fatigue   5. Other specified hypothyroidism   6. Pseudogout involving multiple joints   7. Vitamin D deficiency   8. Essential hypertension   9. Sleep difficulties    Explained to patient what BMI refers to, and what it means medically.    Told patient to think about it as a "medical risk stratification measurement" and how increasing BMI is associated with increasing risk/ or worsening state of various diseases such as hypertension, hyperlipidemia, diabetes, premature OA, depression etc.   American Heart Association guidelines for healthy diet, basically Mediterranean diet, and exercise guidelines of 30 minutes 5 days per week or more discussed in detail.  Mediterranean diet in good healthy clean diets discussed. Patient is interested in weight loss; low salt diet, increase water intake.  HTN:  Blood pressure suboptimally controlled today.  GOAL: < 140/90.  Patient denies that she is on hydrochlorothiazide for blood pressure, and only for a little "swelling in her legs.  Education provided re: dash diet/ lowering BP  Sleep hygiene briefly discussed  Health counseling performed.  All questions answered.    Orders Placed This Encounter  Procedures  . CBC with Differential/Platelet  . COMPLETE METABOLIC PANEL WITH GFR  . Hemoglobin A1c  . Lipid panel  . T3, free  . T4, free  . TSH  . VITAMIN D 25 Hydroxy (Vit-D Deficiency, Fractures)  . Vitamin B12    New Prescriptions   No medications on file    Modified Medications   No medications on file    Discontinued Medications   AZITHROMYCIN (ZITHROMAX) 250 MG TABLET    Take first 2 tablets together, then 1 every day until finished.   ESZOPICLONE (LUNESTA) 2 MG TABS TABLET    Take 2 mg by mouth at bedtime as needed for sleep.    HYDROCODONE-ACETAMINOPHEN (HYCET) 7.5-325 MG/15 ML SOLUTION    Take 10 mLs by mouth 4 (four) times daily as needed for moderate pain.   IBUPROFEN (ADVIL,MOTRIN) 200 MG TABLET    Take 400 mg by mouth every 6 (six) hours as needed for moderate pain.   METOPROLOL TARTRATE (LOPRESSOR) 25 MG TABLET    Take 1 tablet (25 mg total) by mouth 2 (two) times daily.    Return in about 4 weeks (around 11/06/2015) for Fasting blood work- near future thern OV with Dr Val Eagle couple weeks later.  The patient was counseled, risk factors were discussed, anticipatory guidance given.  Gross side effects, risk and benefits, and alternatives of medications discussed with patient.  Patient is aware that all medications have potential side effects and we are unable to predict every side effect or drug-drug interaction that may occur.  Expresses verbal understanding and consents to current therapy plan and treatment regimen.  Please see AVS handed out to patient at the end of our visit for further patient instructions/ counseling done pertaining to today's office visit.    Note: This document was prepared using Dragon voice recognition software and may include unintentional dictation errors.  ----------------------------------------------------------------------------------------------------------------------    Subjective:    Chief Complaint  Patient presents with  . Establish Care    HPI: Marissa Larson is a pleasant 60 y.o. female who presents to [facility] today to review their medical history  with me and establish care.   I asked the patient to review their chronic problem list with me to ensure everything was updated and accurate.     Prior PCP Dr Mertha Finders.  Last bldwrk from her was months ago--> last year.     Came from [place]- moved here 4 yrs ago.  Had DO doc in [place], per patient, she saught me out.  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  She is too busy to exercise, and usually too tired  Married- Marissa Larson-  60yrs-   her husband is retired; no children  GYn- Marissa Larson. Is who she gets her yearly examinations and female care from- they are not on the Epic system however     Wt Readings from Last 3 Encounters:  10/09/15 192 lb 6.4 oz (87.3 kg)  09/19/15 195 lb (88.5 kg)  01/18/14 198 lb 3.2 oz (89.9 kg)   BP Readings from Last 3 Encounters:  10/09/15 (!) 144/84  09/19/15 159/79  01/18/14 136/90   Pulse Readings from Last 3 Encounters:  10/09/15 73  09/19/15 77  01/18/14 76   BMI Readings from Last 3 Encounters:  10/09/15 30.13 kg/m  09/19/15 31.47 kg/m  01/18/14 31.04 kg/m    Patient Active Problem List   Diagnosis Date Noted  . Hypothyroidism 10/14/2015  . Pseudogout involving multiple joints 10/14/2015  . Vitamin D deficiency 10/14/2015  . Hormone replacement therapy- per GYN 10/14/2015  . HTN (hypertension) 10/14/2015  . Fatigue 10/14/2015  . h/o Hiatal hernia 10/14/2015  . Sleep difficulties 10/14/2015  . Generalized OA 10/14/2015  . Counseling on health promotion and disease prevention 10/14/2015  . 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 Overweight 01/18/2014  . chronic Palpitations 07/19/2013     Past Medical History:  Diagnosis Date  . Arthritis   . Chest pain 06/2013   Left sided  . Chronic leg pain 06/2013  . Fatigue   . Hiatal hernia   . Nonproductive cough 06/2013  . Sleep difficulties    Change in sleep patterns  . SOB (shortness of breath) 06/2013  . Tachycardia 06/2013  . Thyroid disease    hypo  . UTI (lower urinary tract infection)      Past Surgical History:  Procedure Laterality Date  . ABDOMINAL SURGERY  1994  . FOOT SURGERY Right 2009  . KIDNEY SURGERY Left 1993  . KNEE SURGERY     Multiple knee surgeries  . OOPHORECTOMY Left 1994  . URETEROSCOPY VIA URETEROSTOMY Left 1994     Family History  Problem Relation Age of Onset  . Migraines Other      Fam Hx   . Heart disease Other     Fam Hx  . Thyroid disease Other     Fam Hx - She also has thyroid disease  . Diabetes Other     Fam Hx  . Arthritis Other     Fam Hx  . Gallbladder disease Other     Fam Hx  . Cancer Other     Fam Hx - Pancreatic  . Hypertension Other     Fam Hx  . Heart attack Cousin     Multiple cousins with MI's before 34 years of age  . Heart disease Mother 57  . Hyperlipidemia Mother 57  . Hypertension Mother 66  . Stroke Mother 48  . Heart disease Maternal Grandmother   . Cancer Father 84    pancreatic  .  Diabetes Brother 50  . Heart attack Maternal Uncle   . Heart attack Paternal Aunt      History  Drug Use No    History  Alcohol Use  . 1.2 oz/week  . 2 Glasses of wine per week    History  Smoking Status  . Never Smoker  Smokeless Tobacco  . Never Used    Patient's Medications  New Prescriptions   No medications on file  Previous Medications   ALLOPURINOL (ZYLOPRIM) 100 MG TABLET    Take 1 tablet by mouth daily.   ERGOCALCIFEROL (VITAMIN D2) 50000 UNITS CAPSULE    Take 50,000 Units by mouth once a week. Takes on Sunday   ESTRADIOL-ESTRIOL-PROGESTERONE (BIEST/PROGESTERONE) CREA    Place 1 application onto the skin 2 (two) times daily.   FLAXSEED, LINSEED, (FLAXSEED OIL PO)    Take 3 capsules by mouth daily.   HYDROCHLOROTHIAZIDE (HYDRODIURIL) 25 MG TABLET    Take 12.5 mg by mouth every other day.    LEVOTHYROXINE (SYNTHROID, LEVOTHROID) 112 MCG TABLET    Take 112 mcg by mouth daily before breakfast.   MAGNESIUM GLUCONATE (MAGONATE) 500 MG TABLET    Take 1,000 mg by mouth daily.   MELATONIN 10 MG CAPS    Take 1 capsule by mouth at bedtime.   OMEGA-3 FATTY ACIDS (FISH OIL PO)    Take 2 capsules by mouth daily.   PROGESTERONE MICRONIZED (PROGESTERONE PO)    Take 150 mg by mouth daily.   PROGESTERONE MICRONIZED 10 % CREA    Place 1 application onto the skin daily.   TURMERIC PO    Take 100 mg by mouth daily.  Modified Medications    No medications on file  Discontinued Medications   AZITHROMYCIN (ZITHROMAX) 250 MG TABLET    Take first 2 tablets together, then 1 every day until finished.   ESZOPICLONE (LUNESTA) 2 MG TABS TABLET    Take 2 mg by mouth at bedtime as needed for sleep.    HYDROCODONE-ACETAMINOPHEN (HYCET) 7.5-325 MG/15 ML SOLUTION    Take 10 mLs by mouth 4 (four) times daily as needed for moderate pain.   IBUPROFEN (ADVIL,MOTRIN) 200 MG TABLET    Take 400 mg by mouth every 6 (six) hours as needed for moderate pain.   METOPROLOL TARTRATE (LOPRESSOR) 25 MG TABLET    Take 1 tablet (25 mg total) by mouth 2 (two) times daily.    Allergies: Advil [ibuprofen]; Penicillins; and Sulfa antibiotics  Review of Systems  Constitutional: Negative.  Negative for chills, diaphoresis, fever, malaise/fatigue and weight loss.  HENT: Negative.  Negative for congestion, sore throat and tinnitus.   Eyes: Negative.  Negative for blurred vision, double vision and photophobia.  Respiratory: Positive for cough. Negative for wheezing.        Recently had bronchitis, on the mend now  Cardiovascular: Positive for palpitations. Negative for chest pain.       Chronic, related to stress and anxiety. Negative workup. Much improved past 2 years.  Gastrointestinal: Positive for nausea. Negative for blood in stool, diarrhea and vomiting.       Only when she uses Advil  Genitourinary: Negative.  Negative for dysuria, frequency and urgency.  Musculoskeletal: Positive for joint pain and myalgias.       OA multiple joints History pseudogout  Skin: Negative.  Negative for itching and rash.  Neurological: Negative.  Negative for dizziness, focal weakness, weakness and headaches.  Endo/Heme/Allergies: Negative.  Negative for environmental allergies and polydipsia. Does  not bruise/bleed easily.  Psychiatric/Behavioral: Negative.  Negative for depression and memory loss. The patient is not nervous/anxious and does not have insomnia.       Objective:   Blood pressure (!) 144/84, pulse 73, height 5\' 7"  (1.702 m), weight 192 lb 6.4 oz (87.3 kg). Body mass index is 30.13 kg/m. General: Well Developed, well nourished, and in no acute distress.  Neuro: Alert and oriented x3, extra-ocular muscles intact, sensation grossly intact.  HEENT: Normocephalic, atraumatic, pupils equal round reactive to light, neck supple Skin: no gross suspicious lesions or rashes  Cardiac: Regular rate and rhythm, no murmurs rubs or gallops.  Respiratory: Essentially clear to auscultation bilaterally. Not using accessory muscles, speaking in full sentences.  Abdominal: Soft, not grossly distended Musculoskeletal: Ambulates w/o diff, FROM * 4 ext.  Vasc: less 2 sec cap RF, warm and pink  Psych:  No HI/SI, judgement and insight good, Euthymic mood. Full Affect.

## 2015-10-09 NOTE — Patient Instructions (Addendum)
Please realize, EXERCISE IS MEDICINE!  -  American Heart Association ( AHA) guidelines for exercise : If you are in good health, without any medical conditions, you should engage in 150 minutes of moderate intensity aerobic activity per week.  This means you should be huffing and puffing throughout your workout.   Engaging in regular exercise will improve brain function and memory, as well as improve mood, boost immune system and help with weight management.  As well as the other, more well-known effects of exercise such as decreasing blood sugar levels, decreasing blood pressure,  and decreasing bad cholesterol levels/ increasing good cholesterol levels.     -  The AHA strongly endorses consumption of a diet that contains a variety of foods from all the food categories with an emphasis on fruits and vegetables; fat-free and low-fat dairy products; cereal and grain products; legumes and nuts; and fish, poultry, and/or extra lean meats.    Excessive food intake, especially of foods high in saturated and trans fats, sugar, and salt, should be avoided.    Adequate water intake of roughly 1/2 of your weight in pounds, should equal the ounces of water per day you should drink.  So for instance, if you're 200 pounds, that would be 100 ounces of water per day.         Mediterranean Diet  Why follow it? Research shows. . Those who follow the Mediterranean diet have a reduced risk of heart disease  . The diet is associated with a reduced incidence of Parkinson's and Alzheimer's diseases . People following the diet may have longer life expectancies and lower rates of chronic diseases  . The Dietary Guidelines for Americans recommends the Mediterranean diet as an eating plan to promote health and prevent disease  What Is the Mediterranean Diet?  . Healthy eating plan based on typical foods and recipes of Mediterranean-style cooking . The diet is primarily a plant based diet; these foods should make up a  majority of meals   Starches - Plant based foods should make up a majority of meals - They are an important sources of vitamins, minerals, energy, antioxidants, and fiber - Choose whole grains, foods high in fiber and minimally processed items  - Typical grain sources include wheat, oats, barley, corn, brown rice, bulgar, farro, millet, polenta, couscous  - Various types of beans include chickpeas, lentils, fava beans, black beans, white beans   Fruits  Veggies - Large quantities of antioxidant rich fruits & veggies; 6 or more servings  - Vegetables can be eaten raw or lightly drizzled with oil and cooked  - Vegetables common to the traditional Mediterranean Diet include: artichokes, arugula, beets, broccoli, brussel sprouts, cabbage, carrots, celery, collard greens, cucumbers, eggplant, kale, leeks, lemons, lettuce, mushrooms, okra, onions, peas, peppers, potatoes, pumpkin, radishes, rutabaga, shallots, spinach, sweet potatoes, turnips, zucchini - Fruits common to the Mediterranean Diet include: apples, apricots, avocados, cherries, clementines, dates, figs, grapefruits, grapes, melons, nectarines, oranges, peaches, pears, pomegranates, strawberries, tangerines  Fats - Replace butter and margarine with healthy oils, such as olive oil, canola oil, and tahini  - Limit nuts to no more than a handful a day  - Nuts include walnuts, almonds, pecans, pistachios, pine nuts  - Limit or avoid candied, honey roasted or heavily salted nuts - Olives are central to the Mediterranean diet - can be eaten whole or used in a variety of dishes   Meats Protein - Limiting red meat: no more than a few times a month -   When eating red meat: choose lean cuts and keep the portion to the size of deck of cards - Eggs: approx. 0 to 4 times a week  - Fish and lean poultry: at least 2 a week  - Healthy protein sources include, chicken, turkey, lean beef, lamb - Increase intake of seafood such as tuna, salmon, trout,  mackerel, shrimp, scallops - Avoid or limit high fat processed meats such as sausage and bacon  Dairy - Include moderate amounts of low fat dairy products  - Focus on healthy dairy such as fat free yogurt, skim milk, low or reduced fat cheese - Limit dairy products higher in fat such as whole or 2% milk, cheese, ice cream  Alcohol - Moderate amounts of red wine is ok  - No more than 5 oz daily for women (all ages) and men older than age 65  - No more than 10 oz of wine daily for men younger than 65  Other - Limit sweets and other desserts  - Use herbs and spices instead of salt to flavor foods  - Herbs and spices common to the traditional Mediterranean Diet include: basil, bay leaves, chives, cloves, cumin, fennel, garlic, lavender, marjoram, mint, oregano, parsley, pepper, rosemary, sage, savory, sumac, tarragon, thyme   It's not just a diet, it's a lifestyle:  . The Mediterranean diet includes lifestyle factors typical of those in the region  . Foods, drinks and meals are best eaten with others and savored . Daily physical activity is important for overall good health . This could be strenuous exercise like running and aerobics . This could also be more leisurely activities such as walking, housework, yard-work, or taking the stairs . Moderation is the key; a balanced and healthy diet accommodates most foods and drinks . Consider portion sizes and frequency of consumption of certain foods   Meal Ideas & Options:  . Breakfast:  o Whole wheat toast or whole wheat English muffins with peanut butter & hard boiled egg o Steel cut oats topped with apples & cinnamon and skim milk  o Fresh fruit: banana, strawberries, melon, berries, peaches  o Smoothies: strawberries, bananas, greek yogurt, peanut butter o Low fat greek yogurt with blueberries and granola  o Egg white omelet with spinach and mushrooms o Breakfast couscous: whole wheat couscous, apricots, skim milk, cranberries  . Sandwiches:   o Hummus and grilled vegetables (peppers, zucchini, squash) on whole wheat bread   o Grilled chicken on whole wheat pita with lettuce, tomatoes, cucumbers or tzatziki  o Tuna salad on whole wheat bread: tuna salad made with greek yogurt, olives, red peppers, capers, green onions o Garlic rosemary lamb pita: lamb sauted with garlic, rosemary, salt & pepper; add lettuce, cucumber, greek yogurt to pita - flavor with lemon juice and black pepper  . Seafood:  o Mediterranean grilled salmon, seasoned with garlic, basil, parsley, lemon juice and black pepper o Shrimp, lemon, and spinach whole-grain pasta salad made with low fat greek yogurt  o Seared scallops with lemon orzo  o Seared tuna steaks seasoned salt, pepper, coriander topped with tomato mixture of olives, tomatoes, olive oil, minced garlic, parsley, green onions and cappers  . Meats:  o Herbed greek chicken salad with kalamata olives, cucumber, feta  o Red bell peppers stuffed with spinach, bulgur, lean ground beef (or lentils) & topped with feta   o Kebabs: skewers of chicken, tomatoes, onions, zucchini, squash  o Turkey burgers: made with red onions, mint, dill, lemon juice, feta   cheese topped with roasted red peppers . Vegetarian o Cucumber salad: cucumbers, artichoke hearts, celery, red onion, feta cheese, tossed in olive oil & lemon juice  o Hummus and whole grain pita points with a greek salad (lettuce, tomato, feta, olives, cucumbers, red onion) o Lentil soup with celery, carrots made with vegetable broth, garlic, salt and pepper  o Tabouli salad: parsley, bulgur, mint, scallions, cucumbers, tomato, radishes, lemon juice, olive oil, salt and pepper.    Top Ten Foods for Health  1. Water Drink at least 8 to 12 cups of water daily. Consume half of your body weight in pounds, is the amount of water in ounces to drink daily.  Ie: a 200lb person = 100 oz water daily  2. Dark Green Vegetables Eat dark green vegetables at least  three to four times a week. Good options include broccoli, peppers, brussel sprouts and leafy greens like kale and spinach.  3. Whole Grains Whole grains should be included in your diet at least two to three times daily. Look for whole wheat flour, rye, oatmeal, barley, amaranth, quinoa or a multigrain. A good source of fiber includes 3 to 4 grams of fiber per serving. A great source has 5 or more grams of fiber per serving.  4. Beans and Lentils Try to eat a bean-based meal at least once a week. Try to add legumes, including beans and lentils, to soups, stews, casseroles, salads and dips or eat them plain.  5. Fish Try to eat two to three serving of fish a week. A serving consists of 3 to 4 ounces of cooked fish. Good choices are salmon, trout, herring, bluefish, sardines and tuna.  6. Berries Include two to four servings of fruit in your diet each day. Try to eat berries such as raspberries, blueberries, blackberries and strawberries.  7. Winter Squash Eat butternut and acorn squash as well as other richly pigmented dark orange and green colored vegetables like sweet potato, cantaloupe and mango.  8. Soy 25 grams of soy protein a day is recommended as part of a low-fat diet to help lower cholesterol levels. Try tofu, soymilk, edamame soybeans, tempeh and texturized vegetable protein (TVP).  9. Flaxseed, Nuts and Seeds Add 1 to 2 tablespoons of ground flaxseed or other seeds to food each day or include a moderate amount of nuts - 1/4 cup - in your daily diet.  10. Organic Yogurt Men and women between 19 and 50 years of age need 1000 milligrams of calcium a day and 1200 milligrams if 50 or older. Eat calcium-rich foods such as nonfat or low-fat dairy products three to four times a day. Include organic choices. 

## 2015-10-10 ENCOUNTER — Other Ambulatory Visit (INDEPENDENT_AMBULATORY_CARE_PROVIDER_SITE_OTHER): Payer: BLUE CROSS/BLUE SHIELD

## 2015-10-10 DIAGNOSIS — M159 Polyosteoarthritis, unspecified: Secondary | ICD-10-CM

## 2015-10-10 DIAGNOSIS — E663 Overweight: Secondary | ICD-10-CM | POA: Diagnosis not present

## 2015-10-10 DIAGNOSIS — Z7189 Other specified counseling: Secondary | ICD-10-CM

## 2015-10-10 DIAGNOSIS — Z1389 Encounter for screening for other disorder: Secondary | ICD-10-CM

## 2015-10-10 DIAGNOSIS — R5383 Other fatigue: Secondary | ICD-10-CM

## 2015-10-11 LAB — LIPID PANEL
Cholesterol: 238 mg/dL — ABNORMAL HIGH (ref 125–200)
HDL: 109 mg/dL (ref >=46)
LDL CALC: 108 mg/dL (ref <130)
TRIGLYCERIDES: 105 mg/dL (ref <150)
Total CHOL/HDL Ratio: 2.2 Ratio (ref <=5.0)
VLDL: 21 mg/dL (ref <30)

## 2015-10-11 LAB — HEMOGLOBIN A1C
Hgb A1c MFr Bld: 5.5 % (ref <5.7)
Mean Plasma Glucose: 111 mg/dL

## 2015-10-11 LAB — VITAMIN B12: Vitamin B-12: 447 pg/mL (ref 200–1100)

## 2015-10-11 LAB — CBC WITH DIFFERENTIAL/PLATELET
BASOS ABS: 0 {cells}/uL (ref 0–200)
Basophils Relative: 0 %
EOS PCT: 2 %
Eosinophils Absolute: 180 cells/uL (ref 15–500)
HCT: 43.9 % (ref 35.0–45.0)
HEMOGLOBIN: 14.7 g/dL (ref 11.7–15.5)
LYMPHS PCT: 34 %
Lymphs Abs: 3060 cells/uL (ref 850–3900)
MCH: 31.7 pg (ref 27.0–33.0)
MCHC: 33.5 g/dL (ref 32.0–36.0)
MCV: 94.6 fL (ref 80.0–100.0)
MONOS PCT: 7 %
MPV: 9.6 fL (ref 7.5–12.5)
Monocytes Absolute: 630 cells/uL (ref 200–950)
NEUTROS PCT: 57 %
Neutro Abs: 5130 cells/uL (ref 1500–7800)
PLATELETS: 282 10*3/uL (ref 140–400)
RBC: 4.64 MIL/uL (ref 3.80–5.10)
RDW: 13.5 % (ref 11.0–15.0)
WBC: 9 10*3/uL (ref 3.8–10.8)

## 2015-10-11 LAB — COMPLETE METABOLIC PANEL WITH GFR
ALBUMIN: 4.6 g/dL (ref 3.6–5.1)
ALK PHOS: 79 U/L (ref 33–130)
ALT: 36 U/L — ABNORMAL HIGH (ref 6–29)
AST: 44 U/L — ABNORMAL HIGH (ref 10–35)
BUN: 12 mg/dL (ref 7–25)
CO2: 28 mmol/L (ref 20–31)
Calcium: 9.8 mg/dL (ref 8.6–10.4)
Chloride: 101 mmol/L (ref 98–110)
Creat: 0.62 mg/dL (ref 0.50–1.05)
GFR, Est African American: >89 mL/min (ref >=60)
GLUCOSE: 97 mg/dL (ref 65–99)
POTASSIUM: 4.1 mmol/L (ref 3.5–5.3)
SODIUM: 140 mmol/L (ref 135–146)
Total Bilirubin: 0.6 mg/dL (ref 0.2–1.2)
Total Protein: 7.1 g/dL (ref 6.1–8.1)

## 2015-10-11 LAB — VITAMIN D 25 HYDROXY (VIT D DEFICIENCY, FRACTURES): Vit D, 25-Hydroxy: 23 ng/mL — ABNORMAL LOW (ref 30–100)

## 2015-10-11 LAB — T3, FREE: T3, Free: 3.2 pg/mL (ref 2.3–4.2)

## 2015-10-11 LAB — T4, FREE: Free T4: 1.4 ng/dL (ref 0.8–1.8)

## 2015-10-11 LAB — TSH: TSH: 1.1 m[IU]/L

## 2015-10-14 DIAGNOSIS — R5383 Other fatigue: Secondary | ICD-10-CM | POA: Insufficient documentation

## 2015-10-14 DIAGNOSIS — G479 Sleep disorder, unspecified: Secondary | ICD-10-CM | POA: Insufficient documentation

## 2015-10-14 DIAGNOSIS — M159 Polyosteoarthritis, unspecified: Secondary | ICD-10-CM | POA: Insufficient documentation

## 2015-10-14 DIAGNOSIS — Z79890 Hormone replacement therapy (postmenopausal): Secondary | ICD-10-CM | POA: Insufficient documentation

## 2015-10-14 DIAGNOSIS — Z7189 Other specified counseling: Secondary | ICD-10-CM | POA: Insufficient documentation

## 2015-10-14 DIAGNOSIS — M1189 Other specified crystal arthropathies, multiple sites: Secondary | ICD-10-CM | POA: Insufficient documentation

## 2015-10-14 DIAGNOSIS — E039 Hypothyroidism, unspecified: Secondary | ICD-10-CM | POA: Insufficient documentation

## 2015-10-14 DIAGNOSIS — K449 Diaphragmatic hernia without obstruction or gangrene: Secondary | ICD-10-CM | POA: Insufficient documentation

## 2015-10-14 DIAGNOSIS — I10 Essential (primary) hypertension: Secondary | ICD-10-CM | POA: Insufficient documentation

## 2015-10-14 DIAGNOSIS — E559 Vitamin D deficiency, unspecified: Secondary | ICD-10-CM | POA: Insufficient documentation

## 2015-10-25 ENCOUNTER — Encounter: Payer: Self-pay | Admitting: Family Medicine

## 2015-10-25 ENCOUNTER — Ambulatory Visit (INDEPENDENT_AMBULATORY_CARE_PROVIDER_SITE_OTHER): Payer: BLUE CROSS/BLUE SHIELD | Admitting: Family Medicine

## 2015-10-25 VITALS — BP 136/83 | HR 83 | Ht 67.0 in | Wt 190.8 lb

## 2015-10-25 DIAGNOSIS — E663 Overweight: Secondary | ICD-10-CM

## 2015-10-25 DIAGNOSIS — R300 Dysuria: Secondary | ICD-10-CM

## 2015-10-25 DIAGNOSIS — E038 Other specified hypothyroidism: Secondary | ICD-10-CM | POA: Diagnosis not present

## 2015-10-25 DIAGNOSIS — R7989 Other specified abnormal findings of blood chemistry: Secondary | ICD-10-CM

## 2015-10-25 DIAGNOSIS — F4323 Adjustment disorder with mixed anxiety and depressed mood: Secondary | ICD-10-CM

## 2015-10-25 DIAGNOSIS — E559 Vitamin D deficiency, unspecified: Secondary | ICD-10-CM | POA: Diagnosis not present

## 2015-10-25 DIAGNOSIS — I10 Essential (primary) hypertension: Secondary | ICD-10-CM

## 2015-10-25 DIAGNOSIS — E7889 Other lipoprotein metabolism disorders: Secondary | ICD-10-CM

## 2015-10-25 DIAGNOSIS — Z7189 Other specified counseling: Secondary | ICD-10-CM

## 2015-10-25 DIAGNOSIS — R945 Abnormal results of liver function studies: Secondary | ICD-10-CM

## 2015-10-25 LAB — POCT URINALYSIS DIPSTICK
Bilirubin, UA: NEGATIVE
GLUCOSE UA: NEGATIVE
Ketones, UA: NEGATIVE
Leukocytes, UA: NEGATIVE
Nitrite, UA: NEGATIVE
PH UA: 6
Protein, UA: NEGATIVE
SPEC GRAV UA: 1.01
UROBILINOGEN UA: 0.2

## 2015-10-25 MED ORDER — VITAMIN D (ERGOCALCIFEROL) 1.25 MG (50000 UNIT) PO CAPS: 50000.0000 [IU] | ORAL_CAPSULE | ORAL | 20 refills | Status: DC

## 2015-10-25 NOTE — Progress Notes (Signed)
Assessment and plan:  1. Dysuria   2. Overweight   3. Other specified hypothyroidism   4. Vitamin D deficiency   5. Essential hypertension   6. Counseling on health promotion and disease prevention   7. Elevated HDL   8. Elevated LFTs   9. Adjustment disorder with mixed anxiety and depressed mood     HTN (hypertension) Essentially at goal. Cont HCTZ  Lifestyle changes such as dash diet and engaging in a regular exercise program discussed with patient.  Educational handouts provided  Ambulatory BP monitoring encouraged. Keep log and bring in next OV  Continue current medication(s).   Also, risks and benefits of medications discussed with patient, including alternative treatments.   Encouraged patient to read drug information handouts to further educate self about the medicine prior to starting it.   Contact us prior with any Q's/ concerns.  Adjustment disorder with mixed anxiety and depressed mood Cont Lexapro. Stable Mood  Overweight -- Encouraged weight loss - Counseling done  Hypothyroidism Well controlled, continue current Synthroid dose.  Dysuria No evidence urinary tract infection. -Reassured patient -Follow-up as needed if symptoms do not abate    New Prescriptions   VITAMIN D, ERGOCALCIFEROL, (DRISDOL) 50000 UNITS CAPS CAPSULE    Take 1 capsule (50,000 Units total) by mouth every 7 (seven) days.    Modified Medications   No medications on file    Discontinued Medications   ERGOCALCIFEROL (VITAMIN D2) 50000 UNITS CAPSULE    Take 50,000 Units by mouth once a week. Takes on Sunday    Return in about 4 months (around 02/25/2016) for htn, wt.  Anticipatory guidance and routine counseling done re: condition, txmnt options and need for follow up. All questions of patient's were answered.   Gross side effects, risk and benefits, and alternatives of medications discussed with patient.  Patient  is aware that all medications have potential side effects and we are unable to predict every sideeffect or drug-drug interaction that may occur.  Expresses verbal understanding and consents to current therapy plan and treatment regiment.  Please see AVS handed out to patient at the end of our visit for additional patient instructions/ counseling done pertaining to today's office visit.  Note: This document was prepared using Dragon voice recognition software and may include unintentional dictation errors.   ----------------------------------------------------------------------------------------------------------------------  Subjective:   CC: Marissa Larson is a 60 y.o. female who presents to [facility] today for review and discussion of recent bloodwork that was done.   Patient with acute complaints of 2-3 days dysuria, increased frequency, urgency, some lower abdominal pain, mild nausea-no vomiting, flank\back pain. She is worried she has a urinary tract infection.  All recent blood work that we ordered was reviewed with patient today.  Patient was counseled on all abnormalities and we discussed dietary and lifestyle changes that could help those values (also medications when appropriate).  Extensive health counseling performed and all patient's concerns/ questions were addressed.    Wt Readings from Last 3 Encounters:  10/25/15 190 lb 12.8 oz (86.5 kg)  10/09/15 192 lb 6.4 oz (87.3 kg)  09/19/15 195 lb (88.5 kg)   BP Readings from Last 3 Encounters:  10/25/15 136/83  10/09/15 (!) 144/84  09/19/15 159/79   Pulse Readings from Last 3 Encounters:  10/25/15 83  10/09/15 73  09/19/15 77   BMI Readings from Last 3 Encounters:  10/25/15 29.88 kg/m  10/09/15 30.13 kg/m  09/19/15  31.47 kg/m     Patient Care Team    Relationship Specialty Notifications Start End  Mellody Dance, DO PCP - General Family Medicine  10/09/15   Evans Lance, MD  Consulting Physician Cardiology  10/09/15   Elsie Saas, MD Consulting Physician Orthopedic Surgery  10/09/15   Bo Merino, MD Consulting Physician Rheumatology  10/09/15   Carolan Clines, MD Consulting Physician Urology  10/09/15   Juanda Chance, NP Nurse Practitioner Obstetrics and Gynecology  10/09/15   Lorne Skeens, MD Consulting Physician Endocrinology  10/09/15   Richmond Campbell, MD Consulting Physician Gastroenterology  10/09/15      Full medical history updated and reviewed in the office today  Patient Active Problem List   Diagnosis Date Noted  . Elevated HDL 11/11/2015  . Elevated LFTs 11/11/2015  . Adjustment disorder with mixed anxiety and depressed mood 11/11/2015  . Dysuria 11/11/2015  . Hypothyroidism 10/14/2015  . Pseudogout involving multiple joints 10/14/2015  . Vitamin D deficiency 10/14/2015  . Hormone replacement therapy- per GYN 10/14/2015  . HTN (hypertension) 10/14/2015  . Fatigue 10/14/2015  . h/o Hiatal hernia 10/14/2015  . Sleep difficulties 10/14/2015  . Generalized OA 10/14/2015  . Counseling on health promotion and disease prevention 10/14/2015  . 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 Age-related nuclear cataract of both eyes 01/25/2015  . Posterior vitreous detachment of left eye 01/25/2015  . Overweight 01/18/2014  . chronic Palpitations 07/19/2013    Past Medical History:  Diagnosis Date  . Arthritis   . Chest pain 06/2013   Left sided  . Chronic leg pain 06/2013  . Fatigue   . Hiatal hernia   . Nonproductive cough 06/2013  . Sleep difficulties    Change in sleep patterns  . SOB (shortness of breath) 06/2013  . Tachycardia 06/2013  . Thyroid disease    hypo  . UTI (lower urinary tract infection)     Past Surgical History:  Procedure Laterality Date  . ABDOMINAL SURGERY  1994  . FOOT SURGERY Right 2009  . KIDNEY SURGERY Left 1993  . KNEE SURGERY     Multiple knee surgeries  . OOPHORECTOMY Left 1994  . URETEROSCOPY VIA  URETEROSTOMY Left 1994    Social History  Substance Use Topics  . Smoking status: Never Smoker  . Smokeless tobacco: Never Used  . Alcohol use 1.2 oz/week    2 Glasses of wine per week    Family History  Problem Relation Age of Onset  . Migraines Other     Fam Hx   . Heart disease Other     Fam Hx  . Thyroid disease Other     Fam Hx - She also has thyroid disease  . Diabetes Other     Fam Hx  . Arthritis Other     Fam Hx  . Gallbladder disease Other     Fam Hx  . Cancer Other     Fam Hx - Pancreatic  . Hypertension Other     Fam Hx  . Heart attack Cousin     Multiple cousins with MI's before 74 years of age  . Heart disease Mother 46  . Hyperlipidemia Mother 46  . Hypertension Mother 66  . Stroke Mother 16  . Heart disease Maternal Grandmother   . Cancer Father 1    pancreatic  . Diabetes Brother 68  . Heart attack Maternal Uncle   . Heart attack Paternal Aunt      Medications: Current Outpatient  Prescriptions  Medication Sig Dispense Refill  . allopurinol (ZYLOPRIM) 100 MG tablet Take 1 tablet by mouth daily.    Marland Kitchen escitalopram (LEXAPRO) 10 MG tablet Take 10 mg by mouth daily.    . Estradiol-Estriol-Progesterone (BIEST/PROGESTERONE) CREA Place 1 application onto the skin 2 (two) times daily.    . Flaxseed, Linseed, (FLAXSEED OIL PO) Take 3 capsules by mouth daily.    . hydrochlorothiazide (HYDRODIURIL) 25 MG tablet Take 12.5 mg by mouth every other day.     . levothyroxine (SYNTHROID, LEVOTHROID) 112 MCG tablet Take 112 mcg by mouth daily before breakfast.    . magnesium gluconate (MAGONATE) 500 MG tablet Take 1,000 mg by mouth daily.    . Melatonin 10 MG CAPS Take 1 capsule by mouth at bedtime.    . Omega-3 Fatty Acids (FISH OIL PO) Take 2 capsules by mouth daily.    . Progesterone Micronized (PROGESTERONE PO) Take 150 mg by mouth daily.    . Progesterone Micronized 10 % CREA Place 1 application onto the skin daily.    . TURMERIC PO Take 100 mg by mouth  daily.    . Vitamin D, Cholecalciferol, 1000 units CAPS Take 2,000 Units by mouth daily.    . Vitamin D, Ergocalciferol, (DRISDOL) 50000 units CAPS capsule Take 1 capsule (50,000 Units total) by mouth every 7 (seven) days. 12 capsule 20   No current facility-administered medications for this visit.     Allergies:  Allergies  Allergen Reactions  . Advil [Ibuprofen] Nausea Only    History of gastric ulcer with hemorrhage secondary to ibuprofen use  . Penicillins Hives and Itching  . Sulfa Antibiotics Hives and Itching    Bactrim    ROS: Review of Systems  Constitutional: Negative.  Negative for chills, diaphoresis, fever, malaise/fatigue and weight loss.  HENT: Positive for sore throat. Negative for congestion and tinnitus.   Eyes: Negative.  Negative for blurred vision, double vision and photophobia.  Respiratory: Negative.  Negative for cough and wheezing.   Cardiovascular: Negative.  Negative for chest pain and palpitations.  Gastrointestinal: Positive for nausea. Negative for blood in stool, diarrhea and vomiting.  Genitourinary: Positive for dysuria, flank pain, frequency and urgency.  Musculoskeletal: Positive for joint pain. Negative for myalgias.       Chronic problem  Skin: Negative.  Negative for itching and rash.  Neurological: Positive for dizziness. Negative for focal weakness, weakness and headaches.  Endo/Heme/Allergies: Negative.  Negative for environmental allergies and polydipsia. Does not bruise/bleed easily.  Psychiatric/Behavioral: Negative.  Negative for depression and memory loss. The patient is not nervous/anxious and does not have insomnia.    Objective:  Blood pressure 136/83, pulse 83, height '5\' 7"'  (1.702 m), weight 190 lb 12.8 oz (86.5 kg). Body mass index is 29.88 kg/m. Gen:   Well NAD, A and O *3 HEENT:    [place]/AT, EOMI,  MMM, OP- clr Lungs:   Normal work of breathing. CTA B/L, no Wh, rhonchi Heart:   RRR, S1, S2 WNL's, no MRG Abd:   No gross  distention, NO G,R,R, + BS, No OM, No SPTTP, no flank TTP Exts:    warm, pink,  Brisk capillary refill, warm and well perfused.  Psych:    No HI/SI, judgement and insight good, Euthymic mood. Full Affect.   Recent Results (from the past 2160 hour(s))  CBC with Differential/Platelet     Status: None   Collection Time: 10/10/15  8:19 AM  Result Value Ref Range   WBC 9.0  3.8 - 10.8 K/uL   RBC 4.64 3.80 - 5.10 MIL/uL   Hemoglobin 14.7 11.7 - 15.5 g/dL   HCT 43.9 35.0 - 45.0 %   MCV 94.6 80.0 - 100.0 fL   MCH 31.7 27.0 - 33.0 pg   MCHC 33.5 32.0 - 36.0 g/dL   RDW 13.5 11.0 - 15.0 %   Platelets 282 140 - 400 K/uL   MPV 9.6 7.5 - 12.5 fL   Neutro Abs 5,130 1,500 - 7,800 cells/uL   Lymphs Abs 3,060 850 - 3,900 cells/uL   Monocytes Absolute 630 200 - 950 cells/uL   Eosinophils Absolute 180 15 - 500 cells/uL   Basophils Absolute 0 0 - 200 cells/uL   Neutrophils Relative % 57 %   Lymphocytes Relative 34 %   Monocytes Relative 7 %   Eosinophils Relative 2 %   Basophils Relative 0 %   Smear Review Criteria for review not met   COMPLETE METABOLIC PANEL WITH GFR     Status: Abnormal   Collection Time: 10/10/15  8:19 AM  Result Value Ref Range   Sodium 140 135 - 146 mmol/L   Potassium 4.1 3.5 - 5.3 mmol/L   Chloride 101 98 - 110 mmol/L   CO2 28 20 - 31 mmol/L   Glucose, Bld 97 65 - 99 mg/dL   BUN 12 7 - 25 mg/dL   Creat 0.62 0.50 - 1.05 mg/dL    Comment:   For patients > or = 58 years of age: The upper reference limit for Creatinine is approximately 13% higher for people identified as African-American.      Total Bilirubin 0.6 0.2 - 1.2 mg/dL   Alkaline Phosphatase 79 33 - 130 U/L   AST 44 (H) 10 - 35 U/L   ALT 36 (H) 6 - 29 U/L   Total Protein 7.1 6.1 - 8.1 g/dL   Albumin 4.6 3.6 - 5.1 g/dL   Calcium 9.8 8.6 - 10.4 mg/dL   GFR, Est African American >89 >=60 mL/min   GFR, Est Non African American >89 >=60 mL/min  Hemoglobin A1c     Status: None   Collection Time: 10/10/15   8:19 AM  Result Value Ref Range   Hgb A1c MFr Bld 5.5 <5.7 %    Comment:   For the purpose of screening for the presence of diabetes:   <5.7%       Consistent with the absence of diabetes 5.7-6.4 %   Consistent with increased risk for diabetes (prediabetes) >=6.5 %     Consistent with diabetes   This assay result is consistent with a decreased risk of diabetes.   Currently, no consensus exists regarding use of hemoglobin A1c for diagnosis of diabetes in children.   According to American Diabetes Association (ADA) guidelines, hemoglobin A1c <7.0% represents optimal control in non-pregnant diabetic patients. Different metrics may apply to specific patient populations. Standards of Medical Care in Diabetes (ADA).      Mean Plasma Glucose 111 mg/dL  Lipid panel     Status: Abnormal   Collection Time: 10/10/15  8:19 AM  Result Value Ref Range   Cholesterol 238 (H) 125 - 200 mg/dL   Triglycerides 105 <150 mg/dL   HDL 109 >=46 mg/dL   Total CHOL/HDL Ratio 2.2 <=5.0 Ratio   VLDL 21 <30 mg/dL   LDL Cholesterol 108 <130 mg/dL    Comment:   Total Cholesterol/HDL Ratio:CHD Risk  Coronary Heart Disease Risk Table                                        Men       Women          1/2 Average Risk              3.4        3.3              Average Risk              5.0        4.4           2X Average Risk              9.6        7.1           3X Average Risk             23.4       11.0 Use the calculated Patient Ratio above and the CHD Risk table  to determine the patient's CHD Risk.   T3, free     Status: None   Collection Time: 10/10/15  8:19 AM  Result Value Ref Range   T3, Free 3.2 2.3 - 4.2 pg/mL  T4, free     Status: None   Collection Time: 10/10/15  8:19 AM  Result Value Ref Range   Free T4 1.4 0.8 - 1.8 ng/dL  TSH     Status: None   Collection Time: 10/10/15  8:19 AM  Result Value Ref Range   TSH 1.10 mIU/L    Comment:   Reference Range   > or = 20  Years  0.40-4.50   Pregnancy Range First trimester  0.26-2.66 Second trimester 0.55-2.73 Third trimester  0.43-2.91     VITAMIN D 25 Hydroxy (Vit-D Deficiency, Fractures)     Status: Abnormal   Collection Time: 10/10/15  8:19 AM  Result Value Ref Range   Vit D, 25-Hydroxy 23 (L) 30 - 100 ng/mL    Comment: Vitamin D Status           25-OH Vitamin D        Deficiency                <20 ng/mL        Insufficiency         20 - 29 ng/mL        Optimal             > or = 30 ng/mL   For 25-OH Vitamin D testing on patients on D2-supplementation and patients for whom quantitation of D2 and D3 fractions is required, the QuestAssureD 25-OH VIT D, (D2,D3), LC/MS/MS is recommended: order code [id number] (patients > 2 yrs).   Vitamin B12     Status: None   Collection Time: 10/10/15  8:19 AM  Result Value Ref Range   Vitamin B-12 447 200 - 1,100 pg/mL  POCT urinalysis dipstick     Status: Abnormal   Collection Time: 10/25/15  2:04 PM  Result Value Ref Range   Color, UA yellow    Clarity, UA clear    Glucose, UA negative    Bilirubin, UA negative    Ketones, UA negative    Spec Grav, UA 1.010    Blood, UA small  pH, UA 6.0    Protein, UA negative    Urobilinogen, UA 0.2    Nitrite, UA negative    Leukocytes, UA Negative Negative

## 2015-10-25 NOTE — Patient Instructions (Signed)
   Vegetarian Eating and Nutrition Vegetarian diets are designed for those people who prefer vegetarian eating for religious, ecologic, or personal reasons. These diets, which are often lower in cholesterol and saturated fats, can provide significant health benefits. They result in lower rates of obesity, diabetes, breast and colon cancers, and cardiovascular and gallbladder diseases.  There are several types of vegetarian diets, but all restrict animal proteins to some degree. Because animal proteins have important nutrients, vegetarians should make sure to get these nutrients from other types of foods. The main purpose of healthy vegetarian eating is to provide the energy, vitamins, and minerals for proper growth and health maintenance at any age.  WHAT DO I NEED TO KNOW ABOUT VEGETARIAN EATING AND NUTRITION? The following nutrients are found in animal products. It is important to make sure you get enough of these nutrients from your diet. If you think you are not getting the right nutrients or if you do not eat any animal products, talk with your health care provider or dietitian about taking supplements. Protein Your dietitian can help you determine your individual protein needs. Sources of protein include:  Beans, such as black beans or kidney beans, or other legumes, such as lentils and split peas. Soy products. Nuts, such as almonds, Brazil nut[place]stonias, and pecans. Seeds, such as sunflower seeds. Tofu, tempeh, and hummus. Eggs, milk, and cheese. Vitamin B12 This vitamin is only found in:  Cheeses, fish, and eggs. It can also be found in some breakfast cereals and other prepared products that have added vitamin B12. If you eat no animal products, you should discuss taking a supplement with your health care provider or dietitian. Vitamin D Good sources of vitamin D include:  Cod liver oil. Fish, such as swordfish, salmon, and tuna. Dairy products. Orange juice. Fortified mushrooms. Cereals with added  vitamin D. Iron Good sources of iron include:  Dark, leafy greens. Nuts. Beans. Grain products that have added iron, such as cereals. Tofu, tempeh, soybeans, and quinoa. Plant-based iron is better absorbed when eaten with vitamin C. For example, squeeze lemon juice over cooked greens like kale, chard, or spinach, or have a glass of orange juice with your meals. Omega-3 Fatty Acids Good sources of omega-3 fatty acids include:  Walnuts. Foods with added omega-3 fatty acids, such as eggs, milk, and juices. Flax seeds, canola oil, soybean oil, and tofu. Fish (cold water fatty fish such as salmon, sardines, mackerel, herring, lake trout, and albacore tuna). Calcium Good sources of calcium include:  Dark, leafy greens, such as kale, bok choy, Chinese cabbage, collard greens and mustard greens. Broccoli. Okra. Dairy products. Soy products with added calcium. Calcium-fortified breakfast cereals, calcium-fortified fruit juices, and figs. Zinc Good sources of zinc include:  Legumes. Dairy products. Wheat germ, cereals, and breads that have added zinc. Baked beans. Legumes, such as cashews, chickpeas, kidney beans, and green peas. Almonds and nut butters. Tofu and other soy products.   This information is not intended to replace advice given to you by your health care provider. Make sure you discuss any questions you have with your health care provider.   Document Released: 12/26/2002 Document Revised: 01/13/2014 Document Reviewed: 10/28/2012 Elsevier Interactive Patient Education [employer]2016 Elsevier Inc.

## 2015-11-11 DIAGNOSIS — E7889 Other lipoprotein metabolism disorders: Secondary | ICD-10-CM | POA: Insufficient documentation

## 2015-11-11 DIAGNOSIS — R300 Dysuria: Secondary | ICD-10-CM | POA: Insufficient documentation

## 2015-11-11 DIAGNOSIS — R945 Abnormal results of liver function studies: Secondary | ICD-10-CM

## 2015-11-11 DIAGNOSIS — R7989 Other specified abnormal findings of blood chemistry: Secondary | ICD-10-CM | POA: Insufficient documentation

## 2015-11-11 DIAGNOSIS — F4323 Adjustment disorder with mixed anxiety and depressed mood: Secondary | ICD-10-CM | POA: Insufficient documentation

## 2015-11-11 NOTE — Assessment & Plan Note (Signed)
No evidence urinary tract infection. -Reassured patient -Follow-up as needed if symptoms do not abate

## 2015-11-11 NOTE — Assessment & Plan Note (Signed)
Cont Lexapro. Stable Mood

## 2015-11-11 NOTE — Assessment & Plan Note (Signed)
--   Encouraged weight loss - Counseling done

## 2015-11-11 NOTE — Assessment & Plan Note (Signed)
Well controlled, continue current Synthroid dose.

## 2015-11-11 NOTE — Assessment & Plan Note (Signed)
Essentially at goal. Cont HCTZ  Lifestyle changes such as dash diet and engaging in a regular exercise program discussed with patient.  Educational handouts provided  Ambulatory BP monitoring encouraged. Keep log and bring in next OV  Continue current medication(s).   Also, risks and benefits of medications discussed with patient, including alternative treatments.   Encouraged patient to read drug information handouts to further educate self about the medicine prior to starting it.   Contact us prior with any Q's/ concerns.

## 2016-02-21 ENCOUNTER — Ambulatory Visit: Payer: BLUE CROSS/BLUE SHIELD | Admitting: Family Medicine

## 2016-03-27 ENCOUNTER — Telehealth: Payer: Self-pay | Admitting: Family Medicine

## 2016-03-27 NOTE — Telephone Encounter (Signed)
Pt called state she has been suffering w/ severe  Sinus congestion, Cough & sneezing for the last 3-5 dys-- advised no available appts.-- She wanted to know if something could be called into or if nurse / Provider could advise her / [phone]-- she is taking OTC meds (not working). --glh

## 2016-03-27 NOTE — Telephone Encounter (Signed)
Pt states that she has green nasal drainage x 3 days and is having some coughing now.  Pt states that she has tried OTC Claritin, Nyquil and Dayquil.  Advised pt to increase rest, take Vitamin C supplement with her multivitamin, OTC Mucinex and saline nasal rinses with distilled water and the "packets" that come with the sinus rinse kits.  Advised pt that if she develops fever over the weekend, proceed to UC.  If no improvement by 03/31/16 pt is to call us back.  Pt expressed understanding and is agreeable.  Tiajuana Amass. Nelson, CMA

## 2016-03-31 ENCOUNTER — Ambulatory Visit: Payer: BLUE CROSS/BLUE SHIELD | Admitting: Family Medicine

## 2016-04-02 ENCOUNTER — Ambulatory Visit (INDEPENDENT_AMBULATORY_CARE_PROVIDER_SITE_OTHER): Payer: BLUE CROSS/BLUE SHIELD | Admitting: Adult Health

## 2016-04-02 ENCOUNTER — Encounter: Payer: Self-pay | Admitting: Adult Health

## 2016-04-02 ENCOUNTER — Telehealth: Payer: Self-pay

## 2016-04-02 VITALS — BP 137/86 | HR 70 | Temp 98.3°F | Ht 67.0 in | Wt 196.6 lb

## 2016-04-02 DIAGNOSIS — J329 Chronic sinusitis, unspecified: Secondary | ICD-10-CM | POA: Insufficient documentation

## 2016-04-02 DIAGNOSIS — J0110 Acute frontal sinusitis, unspecified: Secondary | ICD-10-CM

## 2016-04-02 DIAGNOSIS — R109 Unspecified abdominal pain: Secondary | ICD-10-CM | POA: Diagnosis not present

## 2016-04-02 LAB — POCT URINALYSIS DIPSTICK
Bilirubin, UA: NEGATIVE
Glucose, UA: NEGATIVE
Ketones, UA: NEGATIVE
NITRITE UA: NEGATIVE
PH UA: 5.5 (ref 5.0–8.0)
Protein, UA: NEGATIVE
RBC UA: NEGATIVE
SPEC GRAV UA: 1.025 (ref 1.030–1.035)
UROBILINOGEN UA: 0.2 (ref ?–2.0)

## 2016-04-02 MED ORDER — DOXYCYCLINE HYCLATE 100 MG PO TABS: 100.0000 mg | ORAL_TABLET | Freq: Two times a day (BID) | ORAL | 0 refills | Status: DC

## 2016-04-02 MED ORDER — ONDANSETRON HCL 4 MG PO TABS: 4.0000 mg | ORAL_TABLET | Freq: Three times a day (TID) | ORAL | 0 refills | Status: DC | PRN

## 2016-04-02 NOTE — Assessment & Plan Note (Signed)
Doxycycline x 10 d Ondansetron as needed for N/V. Increase fluids/rest/vit c. If sx's persist after ABX completed, then please call clinic.

## 2016-04-02 NOTE — Assessment & Plan Note (Signed)
UA negative for UTI

## 2016-04-02 NOTE — Patient Instructions (Signed)

## 2016-04-02 NOTE — Progress Notes (Signed)
Subjective:    Patient ID: Marissa Larson, female    DOB: 1955-05-31, 61 y.o.   MRN: [id number]  HPI:  Marissa Larson presents with clear nasal drainage, facial pressure, sore throat, fever, productive cough (thick yellow/green), and fatigue that began >9 days ago.  She also reports diarrhea and nausea over the weekend that has slowly improved.  Reports only one loose stool this am and denies blood in stool/urine.  She denies abdominal pain, however has mild left flank pain that began yesterday. She has been using OTC Claritan and Nasal Sprays with some sx relief.  Patient Care Team    Relationship Specialty Notifications Start End  Thomasene Lot, DO PCP - General Family Medicine  10/09/15   Marinus Maw, MD Consulting Physician Cardiology  10/09/15   Salvatore Marvel, MD Consulting Physician Orthopedic Surgery  10/09/15   Pollyann Savoy, MD Consulting Physician Rheumatology  10/09/15   Jethro Bolus, MD Consulting Physician Urology  10/09/15   [name], NP Nurse Practitioner Obstetrics and Gynecology  10/09/15   Hali Marry, MD Consulting Physician Endocrinology  10/09/15   Sharrell Ku, MD Consulting Physician Gastroenterology  10/09/15     Patient Active Problem List   Diagnosis Date Noted  . Flank pain 04/02/2016  . Sinusitis 04/02/2016  . Elevated HDL 11/11/2015  . Elevated LFTs 11/11/2015  . Adjustment disorder with mixed anxiety and depressed mood 11/11/2015  . Dysuria 11/11/2015  . Hypothyroidism 10/14/2015  . Pseudogout involving multiple joints 10/14/2015  . Vitamin D deficiency 10/14/2015  . Hormone replacement therapy- per GYN 10/14/2015  . HTN (hypertension) 10/14/2015  . Fatigue 10/14/2015  . h/o Hiatal hernia 10/14/2015  . Sleep difficulties 10/14/2015  . Generalized OA 10/14/2015  . Counseling on health promotion and disease prevention 10/14/2015  . 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 Age-related nuclear cataract of both eyes 01/25/2015  .  Posterior vitreous detachment of left eye 01/25/2015  . Overweight 01/18/2014  . chronic Palpitations 07/19/2013     Past Medical History:  Diagnosis Date  . Arthritis   . Chest pain 06/2013   Left sided  . Chronic leg pain 06/2013  . Fatigue   . Hiatal hernia   . Nonproductive cough 06/2013  . Sleep difficulties    Change in sleep patterns  . SOB (shortness of breath) 06/2013  . Tachycardia 06/2013  . Thyroid disease    hypo  . UTI (lower urinary tract infection)      Past Surgical History:  Procedure Laterality Date  . ABDOMINAL SURGERY  1994  . FOOT SURGERY Right 2009  . KIDNEY SURGERY Left 1993  . KNEE SURGERY     Multiple knee surgeries  . OOPHORECTOMY Left 1994  . URETEROSCOPY VIA URETEROSTOMY Left 1994     Family History  Problem Relation Age of Onset  . Migraines Other     Fam Hx   . Heart disease Other     Fam Hx  . Thyroid disease Other     Fam Hx - She also has thyroid disease  . Diabetes Other     Fam Hx  . Arthritis Other     Fam Hx  . Gallbladder disease Other     Fam Hx  . Cancer Other     Fam Hx - Pancreatic  . Hypertension Other     Fam Hx  . Heart attack Cousin     Multiple cousins with MI's before 78 years of age  . Heart disease  Mother 40  . Hyperlipidemia Mother 5740  . Hypertension Mother 2140  . S22troke Mother 7665  . Heart disease Maternal Grandmother   . Cancer Father [id number]    pancreatic  . Diabetes Brother 50  . Heart attack Maternal Uncle   . Heart attack Paternal Aunt      History  Drug Use No     History  Alcohol Use  . 1.2 oz/week  . 2 Glasses of wine per week     History  Smoking Status  . Never Smoker  Smokeless Tobacco  . Never Used     Outpatient Encounter Prescriptions as of 04/02/2016  Medication Sig Note  . allopurinol (ZYLOPRIM) 100 MG tablet Take 1 tablet by mouth daily. 01/18/2014: Received from: External Pharmacy Received Sig:   . Ascorbic Acid (VITAMIN C) 1000 MG tablet Take 1,000 mg by mouth daily.    Marland Kitchen. escitalopram (LEXAPRO) 10 MG tablet Take 10 mg by mouth daily.   . Flaxseed, Linseed, (FLAXSEED OIL PO) Take 3 capsules by mouth daily.   . hydrochlorothiazide (MICROZIDE) 12.5 MG capsule Take 1 capsule by mouth daily.   Marland Kitchen. levothyroxine (SYNTHROID, LEVOTHROID) 112 MCG tablet Take 112 mcg by mouth daily before breakfast.   . magnesium gluconate (MAGONATE) 500 MG tablet Take 1,000 mg by mouth daily.   . Melatonin 10 MG CAPS Take 1 capsule by mouth at bedtime.   . Omega-3 Fatty Acids (FISH OIL PO) Take 2 capsules by mouth daily.   . Progesterone Micronized (PROGESTERONE PO) Take 150 mg by mouth daily. 06/17/2013: Custom Compounded Dose   . TURMERIC PO Take 100 mg by mouth daily.   . Vitamin D, Ergocalciferol, (DRISDOL) 50000 units CAPS capsule Take 1 capsule (50,000 Units total) by mouth every 7 (seven) days.   . [DISCONTINUED] Estradiol-Estriol-Progesterone (BIEST/PROGESTERONE) CREA Place 1 application onto the skin 2 (two) times daily.   . [DISCONTINUED] hydrochlorothiazide (HYDRODIURIL) 25 MG tablet Take 12.5 mg by mouth every other day.  01/18/2014: Received from: External Pharmacy Received Sig:   . [DISCONTINUED] Progesterone Micronized 10 % CREA Place 1 application onto the skin daily.   . [DISCONTINUED] Vitamin D, Cholecalciferol, 1000 units CAPS Take 2,000 Units by mouth daily.   Marland Kitchen. doxycycline (VIBRA-TABS) 100 MG tablet Take 1 tablet (100 mg total) by mouth 2 (two) times daily.   . ondansetron (ZOFRAN) 4 MG tablet Take 1 tablet (4 mg total) by mouth every 8 (eight) hours as needed for nausea or vomiting.    No facility-administered encounter medications on file as of 04/02/2016.     Allergies: Advil [ibuprofen]; Penicillins; and Sulfa antibiotics  Body mass index is 30.79 kg/m.  Blood pressure 137/86, pulse 70, temperature 98.3 F (36.8 C), temperature source Oral, height 5\' 7"  (1.702 m), weight 196 lb 9.6 oz (89.2 kg).     Review of Systems  Constitutional: Positive for  activity change, appetite change, chills, diaphoresis, fatigue and fever. Negative for unexpected weight change.  HENT: Positive for congestion, ear pain, postnasal drip, rhinorrhea, sinus pain, sinus pressure, sneezing and sore throat.   Eyes: Negative for visual disturbance.  Respiratory: Positive for cough. Negative for chest tightness, shortness of breath, wheezing and stridor.   Cardiovascular: Negative for chest pain, palpitations and leg swelling.  Gastrointestinal: Positive for diarrhea. Negative for abdominal distention, abdominal pain, blood in stool, constipation, nausea and vomiting.  Endocrine: Negative for cold intolerance, heat intolerance, polydipsia, polyphagia and polyuria.  Genitourinary: Positive for flank pain. Negative for difficulty urinating, hematuria and  vaginal bleeding.  Skin: Negative for color change, pallor, rash and wound.  Neurological: Positive for headaches. Negative for dizziness, tremors, weakness and light-headedness.       Objective:   Physical Exam  Constitutional: She is oriented to person, place, and time. She appears well-developed and well-nourished. No distress.  HENT:  Head: Normocephalic and atraumatic.  Right Ear: Hearing and external ear normal. Tympanic membrane is erythematous and bulging.  Left Ear: Hearing and external ear normal. Tympanic membrane is erythematous and bulging.  Nose: Mucosal edema and rhinorrhea present. Right sinus exhibits maxillary sinus tenderness and frontal sinus tenderness. Left sinus exhibits maxillary sinus tenderness and frontal sinus tenderness.  Mouth/Throat: Posterior oropharyngeal edema and posterior oropharyngeal erythema present. No oropharyngeal exudate or tonsillar abscesses.  Eyes: Conjunctivae are normal. Pupils are equal, round, and reactive to light.  Neck: Normal range of motion.  Cardiovascular: Normal rate, regular rhythm, normal heart sounds and intact distal pulses.   Pulmonary/Chest: Effort  normal and breath sounds normal. No respiratory distress. She has no wheezes. She has no rales. She exhibits no tenderness.  Lymphadenopathy:    She has no cervical adenopathy.  Neurological: She is alert and oriented to person, place, and time.  Skin: Skin is warm. No rash noted. She is diaphoretic. No erythema. No pallor.  Psychiatric: She has a normal mood and affect. Her behavior is normal. Judgment and thought content normal.  Nursing note and vitals reviewed.         Assessment & Plan:   1. Flank pain   2. Acute frontal sinusitis, recurrence not specified     Flank pain UA negative for UTI.  Sinusitis Doxycycline x 10 d Ondansetron as needed for N/V. Increase fluids/rest/vit c. If sx's persist after ABX completed, then please call clinic.    FOLLOW-UP:  Return if symptoms worsen or fail to improve.

## 2016-04-02 NOTE — Telephone Encounter (Signed)
Pt called stating that she is still having problems with nasal drainage and URI type symptoms.  Pt states that she is now having some diarrhea as well.  Advised pt that she needs OV to evaluate for possible need for antibiotics, given that it has been approximately 10 days that she has been having symptoms.  Pt transferred to front desk to schedule appt.  Tiajuana Amass. Yaritzel Stange, CMA

## 2016-04-11 DIAGNOSIS — M47816 Spondylosis without myelopathy or radiculopathy, lumbar region: Secondary | ICD-10-CM | POA: Insufficient documentation

## 2016-04-11 DIAGNOSIS — M170 Bilateral primary osteoarthritis of knee: Secondary | ICD-10-CM | POA: Insufficient documentation

## 2016-04-11 DIAGNOSIS — E790 Hyperuricemia without signs of inflammatory arthritis and tophaceous disease: Secondary | ICD-10-CM | POA: Insufficient documentation

## 2016-04-11 DIAGNOSIS — M19042 Primary osteoarthritis, left hand: Secondary | ICD-10-CM

## 2016-04-11 DIAGNOSIS — M19041 Primary osteoarthritis, right hand: Secondary | ICD-10-CM | POA: Insufficient documentation

## 2016-04-11 DIAGNOSIS — M160 Bilateral primary osteoarthritis of hip: Secondary | ICD-10-CM | POA: Insufficient documentation

## 2016-04-11 DIAGNOSIS — F5101 Primary insomnia: Secondary | ICD-10-CM | POA: Insufficient documentation

## 2016-04-11 NOTE — Progress Notes (Signed)
Office Visit Note  Patient: Marissa Larson             Date of Birth: 08-05-1955           MRN: [id number]             PCP: Mellody Dance, DO Referring: Mellody Dance, DO Visit Date: 04/16/2016 Occupation: '@GUAROCC' @    Subjective:  Pain of the Left Knee; Pain of the Lower Back; Pain of the Left Foot; Pain of the Right Foot; and Follow-up   History of Present Illness: Marissa Larson is a 61 y.o. female with history of pseudogout and osteoarthritis. She returns today after her last visit in 12/07/2014. According to her she continues to have some discomfort in her bilateral knee joints. She had Visco supplement injections by Dr. Noemi Chapel which lasted only for short period. She's been having pain and discomfort in her hands wrist joints and her left knee joint. She's been also having episodes of severe pain in her bilateral feet which she describes in the first second and third toes simultaneously. She's been having lower back pain. She states she sits at the desk all day neck the computer.  Activities of Daily Living:  Patient reports morning stiffness for 30 minutes.   Patient Reports nocturnal pain.  Difficulty dressing/grooming: Denies Difficulty climbing stairs: Reports Difficulty getting out of chair: Reports Difficulty using hands for taps, buttons, cutlery, and/or writing: Reports   Review of Systems  Constitutional: Positive for fatigue. Negative for night sweats, weight gain, weight loss and weakness.  HENT: Negative for mouth sores, trouble swallowing, trouble swallowing, mouth dryness and nose dryness.   Eyes: Negative for pain, redness, visual disturbance and dryness.  Respiratory: Negative for cough, shortness of breath and difficulty breathing.   Cardiovascular: Negative for chest pain, palpitations, hypertension, irregular heartbeat and swelling in legs/feet.  Gastrointestinal: Negative for blood in stool, constipation and diarrhea.  Endocrine: Negative for increased  urination.  Genitourinary: Negative for vaginal dryness.  Musculoskeletal: Positive for arthralgias, joint pain, joint swelling and morning stiffness. Negative for myalgias, muscle weakness, muscle tenderness and myalgias.  Skin: Negative for color change, rash, hair loss, skin tightness, ulcers and sensitivity to sunlight.  Allergic/Immunologic: Negative for susceptible to infections.  Neurological: Negative for dizziness, memory loss and night sweats.  Hematological: Negative for swollen glands.  Psychiatric/Behavioral: Negative for depressed mood and sleep disturbance. The patient is not nervous/anxious.     PMFS History:  Patient Active Problem List   Diagnosis Date Noted  . Primary osteoarthritis of both feet 04/12/2016  . History of kidney stones 04/12/2016  . Primary osteoarthritis of both hands 04/11/2016  . Bilateral primary osteoarthritis of hip 04/11/2016  . Primary osteoarthritis of both knees 04/11/2016  . Spondylosis of lumbar region without myelopathy or radiculopathy 04/11/2016  . Hyperuricemia 04/11/2016  . Primary insomnia 04/11/2016  . Flank pain 04/02/2016  . Sinusitis 04/02/2016  . Elevated HDL 11/11/2015  . Elevated LFTs 11/11/2015  . Adjustment disorder with mixed anxiety and depressed mood 11/11/2015  . Dysuria 11/11/2015  . Hypothyroidism 10/14/2015  . Pseudogout involving multiple joints 10/14/2015  . Vitamin D deficiency 10/14/2015  . Hormone replacement therapy- per GYN 10/14/2015  . HTN (hypertension) 10/14/2015  . Fatigue 10/14/2015  . h/o Hiatal hernia 10/14/2015  . Sleep difficulties 10/14/2015  . Generalized OA 10/14/2015  . Counseling on health promotion and disease prevention 10/14/2015  . 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 Age-related nuclear cataract of both eyes 01/25/2015  .  Posterior vitreous detachment of left eye 01/25/2015  . Overweight 01/18/2014  . chronic Palpitations 07/19/2013    Past Medical History:  Diagnosis Date    . Arthritis   . Chest pain 06/2013   Left sided  . Chronic leg pain 06/2013  . Fatigue   . Hiatal hernia   . Nonproductive cough 06/2013  . Sleep difficulties    Change in sleep patterns  . SOB (shortness of breath) 06/2013  . Tachycardia 06/2013  . Thyroid disease    hypo  . UTI (lower urinary tract infection)     Family History  Problem Relation Age of Onset  . Migraines Other     Fam Hx   . Heart disease Other     Fam Hx  . Thyroid disease Other     Fam Hx - She also has thyroid disease  . Diabetes Other     Fam Hx  . Arthritis Other     Fam Hx  . Gallbladder disease Other     Fam Hx  . Cancer Other     Fam Hx - Pancreatic  . Hypertension Other     Fam Hx  . Heart attack Cousin     Multiple cousins with MI's before 80 years of age  . Heart disease Mother 50  . Hyperlipidemia Mother 63  . Hypertension Mother 31  . Stroke Mother 51  . Heart disease Maternal Grandmother   . Cancer Father 60    pancreatic  . Diabetes Brother 71  . Heart attack Maternal Uncle   . Heart attack Paternal Aunt    Past Surgical History:  Procedure Laterality Date  . ABDOMINAL SURGERY  1994  . FOOT SURGERY Right 2009  . KIDNEY SURGERY Left 1993  . KNEE SURGERY     Multiple knee surgeries  . OOPHORECTOMY Left 1994  . URETEROSCOPY VIA URETEROSTOMY Left 1994   Social History   Social History Narrative  . No narrative on file     Objective: Vital Signs: BP 126/70   Pulse 78   Resp 14   Ht '5\' 6"'  (1.676 m)   Wt 202 lb (91.6 kg)   BMI 32.60 kg/m    Physical Exam  Constitutional: She is oriented to person, place, and time. She appears well-developed and well-nourished.  HENT:  Head: Normocephalic and atraumatic.  Eyes: Conjunctivae and EOM are normal.  Neck: Normal range of motion.  Cardiovascular: Normal rate, regular rhythm, normal heart sounds and intact distal pulses.   Pulmonary/Chest: Effort normal and breath sounds normal.  Abdominal: Soft. Bowel sounds are normal.   Lymphadenopathy:    She has no cervical adenopathy.  Neurological: She is alert and oriented to person, place, and time.  Skin: Skin is warm and dry. Capillary refill takes less than 2 seconds.  Psychiatric: She has a normal mood and affect. Her behavior is normal.  Nursing note and vitals reviewed.    Musculoskeletal Exam:  FROM of all joints Grip strength equal and strong bilaterally  Both knees w/ post surgical scar bilaterally No warmth, or effusion.  Feet with interdigital space between 2nd and 3rd toe on right foot (hx of reconstruction or right foot). Left foot is normal.  Limited ROM of bilateral hip joints.  c/o pain to sacrum on lower back on palpation (hx of disc disease of lower back).    CDAI Exam: No CDAI exam completed.    Investigation: Findings:  Labs from 10/18/2013 show CBC is normal.  CMP  is normal.  Sed rate is normal.  CK is normal.  TSH is normal.  Hepatitis panel is negative.  G6PD is normal.  Urinalysis is normal at 4.8.  Angiotensin 1 converting enzyme is negative.  Rheumatoid factor, CCP and ANA are negative.  HLA-B27 is negative.    10/18/2013 We obtained x-rays of bilateral hip joints today, which showed normal SI joints, but bilateral hip joints inferomedial moderate narrowing consistent with osteoarthritis of the hip joints.  Bilateral knee joints, 2 views, x-rays were also obtained, which showed bilateral moderate medial compartment narrowing without chondrocalcinosis.  She has moderate patellofemoral narrowing.  These findings are consistent with osteoarthritis of the knee joints.  Lumbar spine x-rays show scoliosis, SI joints were normal, and multilevel spondylosis was noted with facet joint arthropathy.  Bilateral hand x-rays show minimal PIP/DIP narrowing and right 3rd MCP minimal narrowing consistent with osteoarthritis.     12/07/2014 iron studies normal, magnesium normal, uric acid 3.0, CBC normal, CMP AST 38, ALT 59, PTH normal at 45,  calcium 9.8 normal  10/17 CBC normal, CMP AST/ALT 44/36     Imaging: Xr Hips Bilat W Or W/o Pelvis 2v  Result Date: 04/16/2016 She has bilateral inferior medial narrowing of the hip joint. More prominent on the left than the right side. Impression: Mild osteoarthritis of the right hip joint and moderate osteoarthritis of the left hip joint.  Xr Lumbar Spine 2-3 Views  Result Date: 04/16/2016 Mild scoliosis was noted. Multilevel spondylosis in the lumbar spine was noted significant narrowing was noted between L1-L2 and L2-3. Impression these findings are consistent with scoliosis and this disease of lumbar spine.   Speciality Comments: No specialty comments available.    Procedures:  No procedures performed Allergies: Advil [ibuprofen]; Penicillins; and Sulfa antibiotics   Assessment / Plan:     Visit Diagnoses: Pseudogout involving multiple joints - Crystal proven on arthroscopy. Patient gives history of intermittent swelling of her joints. She had no synovitis on examination today. We discussed use of colchicine on when necessary basis. We will fill her prescription today. Indications Contraindications were discussed today.  Primary osteoarthritis of both hands: Joint protection and muscle strengthening discussed.  Bilateral primary osteoarthritis of hip -patient is very limited range of motion. Plan: XR HIPS BILAT W OR W/O PELVIS 2V. X-rays today show only mild osteoarthritis in the right hip and moderate osteoarthritis in the left hip joint. Weight loss diet and exercise will be helpful.  Primary osteoarthritis of both knees - History of right knee joint reconstruction, left knee joint arthroscopic surgery: She had recent Visco supplement injections by Dr. Para March with inadequate  results. She may benefit by using colchicine on when necessary basis.  Primary osteoarthritis of both feet - History of right foot reconstruction, she's chronic pain  DDD lumbar spine -she's been  having increased lower back pain. Plan: XR Lumbar Spine 2-3 Views. Her x-rays were consistent with disc disease of lumbar spine. I'll refer to physical therapy for this.  Hyperuricemia  History of kidney stones - On allopurinol per urologist  Primary insomnia  Vitamin D deficiency   She also has chronic Palpitations; Overweight; Gastric ulcer- due to Mobic; Hypothyroidism;  Hormone replacement therapy- per GYN; HTN (hypertension); h/o Hiatal hernia; Elevated HDL; Elevated LFTs; Age-related nuclear cataract of both eyes; Posterior vitreous detachment of left eye; Adjustment disorder with mixed anxiety and depressed mood; Dysuria; Flank pain; and Sinusitis on her problem list.   PLAN ==>  Restart colchicine (pt had stopped since  she was taking it for a " long time")    Orders: Orders Placed This Encounter  Procedures  . XR HIPS BILAT W OR W/O PELVIS 2V  . XR Lumbar Spine 2-3 Views  . Ambulatory referral to Physical Therapy   Meds ordered this encounter  Medications  . colchicine (COLCRYS) 0.6 MG tablet    Sig: Take 1 tablet (0.6 mg total) by mouth daily.    Dispense:  90 tablet    Refill:  1    Face-to-face time spent with patient was 30 minutes. 50% of time was spent in counseling and coordination of care.  Follow-Up Instructions: Return in about 3 months (around 07/16/2016) for Pseudogout, osteoarthritis, DDD.   Bo Merino, MD  Note - This record has been created using Editor, commissioning.  Chart creation errors have been sought, but may not always  have been located. Such creation errors do not reflect on  the standard of medical care.

## 2016-04-12 DIAGNOSIS — M19072 Primary osteoarthritis, left ankle and foot: Secondary | ICD-10-CM

## 2016-04-12 DIAGNOSIS — Z87442 Personal history of urinary calculi: Secondary | ICD-10-CM | POA: Insufficient documentation

## 2016-04-12 DIAGNOSIS — M19071 Primary osteoarthritis, right ankle and foot: Secondary | ICD-10-CM | POA: Insufficient documentation

## 2016-04-16 ENCOUNTER — Ambulatory Visit (INDEPENDENT_AMBULATORY_CARE_PROVIDER_SITE_OTHER): Payer: BLUE CROSS/BLUE SHIELD | Admitting: Rheumatology

## 2016-04-16 ENCOUNTER — Encounter: Payer: Self-pay | Admitting: Rheumatology

## 2016-04-16 ENCOUNTER — Ambulatory Visit (INDEPENDENT_AMBULATORY_CARE_PROVIDER_SITE_OTHER): Payer: Self-pay

## 2016-04-16 VITALS — BP 126/70 | HR 78 | Resp 14 | Ht 66.0 in | Wt 202.0 lb

## 2016-04-16 DIAGNOSIS — M19041 Primary osteoarthritis, right hand: Secondary | ICD-10-CM | POA: Diagnosis not present

## 2016-04-16 DIAGNOSIS — M1189 Other specified crystal arthropathies, multiple sites: Secondary | ICD-10-CM

## 2016-04-16 DIAGNOSIS — Z87442 Personal history of urinary calculi: Secondary | ICD-10-CM

## 2016-04-16 DIAGNOSIS — M19072 Primary osteoarthritis, left ankle and foot: Secondary | ICD-10-CM | POA: Diagnosis not present

## 2016-04-16 DIAGNOSIS — M170 Bilateral primary osteoarthritis of knee: Secondary | ICD-10-CM | POA: Diagnosis not present

## 2016-04-16 DIAGNOSIS — M19042 Primary osteoarthritis, left hand: Secondary | ICD-10-CM

## 2016-04-16 DIAGNOSIS — M47816 Spondylosis without myelopathy or radiculopathy, lumbar region: Secondary | ICD-10-CM

## 2016-04-16 DIAGNOSIS — F5101 Primary insomnia: Secondary | ICD-10-CM

## 2016-04-16 DIAGNOSIS — E559 Vitamin D deficiency, unspecified: Secondary | ICD-10-CM | POA: Diagnosis not present

## 2016-04-16 DIAGNOSIS — M19071 Primary osteoarthritis, right ankle and foot: Secondary | ICD-10-CM | POA: Diagnosis not present

## 2016-04-16 DIAGNOSIS — M160 Bilateral primary osteoarthritis of hip: Secondary | ICD-10-CM

## 2016-04-16 DIAGNOSIS — E790 Hyperuricemia without signs of inflammatory arthritis and tophaceous disease: Secondary | ICD-10-CM

## 2016-04-16 MED ORDER — COLCHICINE 0.6 MG PO TABS: 0.6000 mg | ORAL_TABLET | Freq: Every day | ORAL | 1 refills | Status: DC

## 2016-05-12 ENCOUNTER — Ambulatory Visit: Payer: BLUE CROSS/BLUE SHIELD | Admitting: Family Medicine

## 2016-05-13 ENCOUNTER — Telehealth: Payer: Self-pay | Admitting: Rheumatology

## 2016-05-13 NOTE — Telephone Encounter (Signed)
Patient states she was restarted on Colcrys at her last appointment. Patient she was informed at that appointment to restart on Colcrys 2 po daily and then drop it back to one daily. Patient states her prescription reads for 1 daily and she started with the two daily and is now taking one daily. Patient states she is not getting the relief with one per day as she was getting with the two per day. Patient would like to know if we could increase her Colcrys prescription to 2 po daily.

## 2016-05-13 NOTE — Telephone Encounter (Signed)
Patient has questions about colcrys rx. She states she first started taking 2 a day and received relief and her new rx was written for 1 a day and she is not getting any relief by only taking 1 per day. Please call patient.

## 2016-05-13 NOTE — Telephone Encounter (Signed)
Okay to take colchicine twice a day. Please call in new prescription if needed

## 2016-05-14 MED ORDER — COLCHICINE 0.6 MG PO TABS: 0.6000 mg | ORAL_TABLET | Freq: Two times a day (BID) | ORAL | 1 refills | Status: DC

## 2016-05-14 NOTE — Telephone Encounter (Signed)
Patient advised she may take the Colchicine twice a day. New prescription has been sent to the pharmacy.

## 2016-05-29 ENCOUNTER — Ambulatory Visit (INDEPENDENT_AMBULATORY_CARE_PROVIDER_SITE_OTHER): Payer: BLUE CROSS/BLUE SHIELD | Admitting: Family Medicine

## 2016-05-29 ENCOUNTER — Encounter: Payer: Self-pay | Admitting: Family Medicine

## 2016-05-29 VITALS — BP 132/80 | HR 77 | Temp 98.3°F | Ht 63.5 in | Wt 198.0 lb

## 2016-05-29 DIAGNOSIS — G479 Sleep disorder, unspecified: Secondary | ICD-10-CM | POA: Diagnosis not present

## 2016-05-29 DIAGNOSIS — E038 Other specified hypothyroidism: Secondary | ICD-10-CM

## 2016-05-29 DIAGNOSIS — Z79890 Hormone replacement therapy (postmenopausal): Secondary | ICD-10-CM | POA: Diagnosis not present

## 2016-05-29 DIAGNOSIS — F5101 Primary insomnia: Secondary | ICD-10-CM

## 2016-05-29 DIAGNOSIS — E663 Overweight: Secondary | ICD-10-CM | POA: Diagnosis not present

## 2016-05-29 DIAGNOSIS — M1189 Other specified crystal arthropathies, multiple sites: Secondary | ICD-10-CM | POA: Diagnosis not present

## 2016-05-29 DIAGNOSIS — I10 Essential (primary) hypertension: Secondary | ICD-10-CM | POA: Diagnosis not present

## 2016-05-29 DIAGNOSIS — E559 Vitamin D deficiency, unspecified: Secondary | ICD-10-CM

## 2016-05-29 MED ORDER — AMITRIPTYLINE HCL 25 MG PO TABS: 50.0000 mg | ORAL_TABLET | Freq: Every day | ORAL | 0 refills | Status: DC

## 2016-05-29 NOTE — Patient Instructions (Signed)
Please take one half tablet of your 25 mg amitriptyline nightly.  If this works well to desired effect of helping you sleep and deal with the pain, then only take the lowest possible dose.  Prescription is written for 50 mg and that is a safe dose to take nightly if needed.  In addition you can take melatonin if you wish to on top of that.    If you have insomnia or difficulty sleeping, this information is for you:  - Avoid caffeinated beverages after lunch,  no alcoholic beverages,  no eating within 2-3 hours of lying down,  avoid exposure to blue light before bed,  avoid daytime naps, and  needs to maintain a regular sleep schedule- go to sleep and wake up around the same time every night.   - Resolve concerns or worries before entering bedroom:  Discussed relaxation techniques with patient and to keep a journal to write down fears\ worries.  I suggested seeing a counselor for CBT.   - Recommend patient meditate or do deep breathing exercises to help relax.   Incorporate the use of white noise machines or listen to "sleep meditation music", or recordings of guided meditations for sleep from YouTube which are free, such as  "guided meditation for detachment from over thinking"  by Ina KickMichael Sealey.

## 2016-05-29 NOTE — Assessment & Plan Note (Signed)
Encourage prudent diet and regular exercise.;  Patient will consider waking up early every morning for speedwalking.

## 2016-05-29 NOTE — Assessment & Plan Note (Signed)
Energy levels are good, mood stable.  Continue current dose.  Labs in October.

## 2016-05-29 NOTE — Assessment & Plan Note (Signed)
Follow-up with rheumatology-Dr. Titus Dubinevashwar and orthopedics-Dr. Thurston HoleWainer

## 2016-05-29 NOTE — Progress Notes (Signed)
Impression and Recommendations:    1. Primary insomnia   2. Overweight   3. Vitamin D deficiency   4. Essential hypertension   5. Other specified hypothyroidism   6. Pseudogout involving multiple joints   7. Hormone replacement therapy- per GYN   8. Sleep difficulties      Overweight Encourage prudent diet and regular exercise.;  Patient will consider waking up early every morning for speedwalking.  Hypothyroidism Energy levels are good, mood stable.  Continue current dose.  Labs in October.  Pseudogout involving multiple joints Follow-up with rheumatology-Dr. Titus Dubin and orthopedics-Dr. Thurston Hole  Vitamin D deficiency Continue supplementation.  Patient feels this helps with her energy levels and somewhat with mood.  Hormone replacement therapy- per GYN Per [name], [facility] at [facility].  [facility].  HTN (hypertension) Well controlled on recheck, continue current medications of hydrochlorothiazide 12.5 mg daily.  Sleep difficulties Trial of Elavil since pain is what is keeping her from sleeping well.  Risks and benefits discussed with patient.  Follow-up sooner than planned if symptoms do not continue to improve or you have additional concerns/questions.   The patient was counseled, risk factors were discussed, anticipatory guidance given.    Meds ordered this encounter  Medications  . Estradiol-Estriol-Progesterone (BIEST/PROGESTERONE) CREA    Sig: Place onto the skin daily.  Marland Kitchen amitriptyline (ELAVIL) 25 MG tablet    Sig: Take 2 tablets (50 mg total) by mouth at bedtime. For sleep as needed.    Dispense:  180 tablet    Refill:  0     Discontinued Medications   No medications on file    Gross side effects, risk and benefits, and alternatives of medications and treatment plan in general discussed with patient.  Patient is aware that all medications have potential side effects and we  are unable to predict every side effect or drug-drug interaction that may occur.   Patient will call with any questions prior to using medication if they have concerns.  Expresses verbal understanding and consents to current therapy and treatment regimen.  No barriers to understanding were identified.  Red flag symptoms and signs discussed in detail.  Patient expressed understanding regarding what to do in case of emergency\urgent symptoms  Please see AVS handed out to patient at the end of our visit for further patient instructions/ counseling done pertaining to today's office visit.   Return for Due for blood work around October.  Follow-up 4-6 months for hypertension, mood, sleep, wt mgt.     Note: This document was prepared using Dragon voice recognition software and may include unintentional dictation errors.  Thomasene Lot 10:11 AM --------------------------------------------------------------------------------------------------------------------------------------------------------------------------------------------------------------------------------------------    Subjective:    CC:  Chief Complaint  Patient presents with  . Weight Check  . Follow-up  . Fatigue    HPI: Marissa Larson is a 61 y.o. female who presents to [facility] today for issues as discussed below.   Patient's primary complaint today is her sleep.  She has not been able to sleep due to back, hip and various joint pains.  She is seeing Dr. Titus Dubin of rheumatology and was recently put on hold [facility] for her arthritic problems approximate 3-4 weeks ago.  Her joint pain is somewhat improved but still sleep is a problem.  She is taking 10 mg of melatonin nightly and still unable to fall asleep many nights.   She is also seeing  Dr. Thurston HoleWainer of [facility] in [place] for evaluation of these various joint pains and to see if physical therapy would be appropriate or any other  treatment options.   She is following up with her GYN miss Julio Sicksarol Curtis, of [facility] [place].  next week for her annual female exam.  They will do mammogram, bone marrow density test etc.  She is on [facility]ical [facility] who does compounding.  This is worked very well for her perimenopausal symptoms.   Wt Readings from Last 3 Encounters:  05/29/16 198 lb (89.8 kg)  04/16/16 202 lb (91.6 kg)  04/02/16 196 lb 9.6 oz (89.2 kg)   BP Readings from Last 3 Encounters:  05/29/16 132/80  04/16/16 126/70  04/02/16 137/86   Pulse Readings from Last 3 Encounters:  05/29/16 77  04/16/16 78  04/02/16 70   BMI Readings from Last 3 Encounters:  05/29/16 34.52 kg/m  04/16/16 32.60 kg/m  04/02/16 30.79 kg/m     Patient Care Team    Relationship Specialty Notifications Start End  Thomasene Lotpalski, Shahida Schnackenberg, DO PCP - General Family Medicine  10/09/15   Marinus Mawaylor, Gregg W, MD Consulting Physician Cardiology  10/09/15   Salvatore MarvelWainer, Robert, MD Consulting Physician Orthopedic Surgery  10/09/15   Pollyann Savoyeveshwar, Shaili, MD Consulting Physician Rheumatology  10/09/15   Jethro Bolusannenbaum, Sigmund, MD Consulting Physician Urology  10/09/15   Julio Sicksurtis, Carol, NP Nurse Practitioner Obstetrics and Gynecology  10/09/15   Altheimer, Casimiro NeedleMichael, MD Consulting Physician Endocrinology  10/09/15   Sharrell KuMedoff, Jeffrey, MD Consulting Physician Gastroenterology  10/09/15      Patient Active Problem List   Diagnosis Date Noted  . Primary osteoarthritis of both feet 04/12/2016  . History of kidney stones 04/12/2016  . Primary osteoarthritis of both hands 04/11/2016  . Bilateral primary osteoarthritis of hip 04/11/2016  . Primary osteoarthritis of both knees 04/11/2016  . Spondylosis of lumbar region without myelopathy or radiculopathy 04/11/2016  . Hyperuricemia 04/11/2016  . Primary insomnia 04/11/2016  . Flank pain 04/02/2016  . Sinusitis 04/02/2016  . Elevated HDL 11/11/2015  . Elevated LFTs  11/11/2015  . Adjustment disorder with mixed anxiety and depressed mood 11/11/2015  . Dysuria 11/11/2015  . Hypothyroidism 10/14/2015  . Pseudogout involving multiple joints 10/14/2015  . Vitamin D deficiency 10/14/2015  . Hormone replacement therapy- per GYN 10/14/2015  . HTN (hypertension) 10/14/2015  . Fatigue 10/14/2015  . h/o Hiatal hernia 10/14/2015  . Sleep difficulties 10/14/2015  . Generalized OA 10/14/2015  . Counseling on health promotion and disease prevention 10/14/2015  . 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 Age-related nuclear cataract of both eyes 01/25/2015  . Posterior vitreous detachment of left eye 01/25/2015  . Overweight 01/18/2014  . chronic Palpitations 07/19/2013    Past Medical history, Surgical history, Family history, Social history, Allergies and Medications have been entered into the medical record, reviewed and changed as needed.    Current Meds  Medication Sig  . allopurinol (ZYLOPRIM) 100 MG tablet Take 1 tablet by mouth daily.  . Ascorbic Acid (VITAMIN C) 1000 MG tablet Take 1,000 mg by mouth daily.  . colchicine (COLCRYS) 0.6 MG tablet Take 1 tablet (0.6 mg total) by mouth 2 (two) times daily.  Marland Kitchen. escitalopram (LEXAPRO) 10 MG tablet Take 10 mg by mouth daily.  . Estradiol-Estriol-Progesterone (BIEST/PROGESTERONE) CREA Place onto the skin daily.  . Flaxseed, Linseed, (FLAXSEED OIL PO) Take 3 capsules by mouth daily.  . hydrochlorothiazide (MICROZIDE) 12.5 MG capsule Take 1  capsule by mouth daily.  Marland Kitchen levothyroxine (SYNTHROID, LEVOTHROID) 112 MCG tablet Take 112 mcg by mouth daily before breakfast.  . magnesium gluconate (MAGONATE) 500 MG tablet Take 1,000 mg by mouth daily.  . Melatonin 10 MG CAPS Take 1 capsule by mouth at bedtime.  . Omega-3 Fatty Acids (FISH OIL PO) Take 2 capsules by mouth daily.  . Progesterone Micronized (PROGESTERONE PO) Take 150 mg by mouth daily.  . TURMERIC PO Take 100 mg by mouth daily.  . Vitamin D,  Ergocalciferol, (DRISDOL) 50000 units CAPS capsule Take 1 capsule (50,000 Units total) by mouth every 7 (seven) days.    Allergies:  Allergies  Allergen Reactions  . Advil [Ibuprofen] Nausea Only    History of gastric ulcer with hemorrhage secondary to ibuprofen use  . Penicillins Hives and Itching  . Sulfa Antibiotics Hives and Itching    Bactrim     Review of Systems: General:   Denies fever, chills, unexplained weight loss.  Optho/Auditory:   Denies visual changes, blurred vision/LOV Respiratory:   Denies wheeze, DOE more than baseline levels.  Cardiovascular:   Denies chest pain, palpitations, new onset peripheral edema  Gastrointestinal:   Denies nausea, vomiting, diarrhea, abd pain.  Genitourinary: Denies dysuria, freq/ urgency, flank pain or discharge from genitals.  Endocrine:     Denies hot or cold intolerance, polyuria, polydipsia. Musculoskeletal:   Denies unexplained myalgias, joint swelling, unexplained arthralgias, gait problems.  Skin:  Denies new onset rash, suspicious lesions Neurological:     Denies dizziness, unexplained weakness, numbness  Psychiatric/Behavioral:   Denies mood changes, suicidal or homicidal ideations, hallucinations    Objective:   Blood pressure 132/80, pulse 77, temperature 98.3 F (36.8 C), temperature source Oral, height 5' 3.5" (1.613 m), weight 198 lb (89.8 kg), SpO2 93 %. Body mass index is 34.52 kg/m. General:  Well Developed, well nourished, appropriate for stated age.  Neuro:  Alert and oriented,  extra-ocular muscles intact  HEENT:  Normocephalic, atraumatic, neck supple, no carotid bruits appreciated  Skin:  no gross rash, warm, pink. Cardiac:  RRR, S1 S2 Respiratory:  ECTA B/L and A/P, Not using accessory muscles, speaking in full sentences- unlabored. Vascular:  Ext warm, no cyanosis apprec.; cap RF less 2 sec. Psych:  No HI/SI, judgement and insight good, Euthymic mood. Full Affect.

## 2016-05-29 NOTE — Assessment & Plan Note (Signed)
Continue supplementation.  Patient feels this helps with her energy levels and somewhat with mood.

## 2016-05-29 NOTE — Assessment & Plan Note (Addendum)
Per Julio Sicksarol Curtis, NP at [facility].  Gets [facility]identical [facility] at [facility].

## 2016-05-29 NOTE — Assessment & Plan Note (Signed)
Well controlled on recheck, continue current medications of hydrochlorothiazide 12.5 mg daily.

## 2016-05-29 NOTE — Assessment & Plan Note (Signed)
Trial of Elavil since pain is what is keeping her from sleeping well.  Risks and benefits discussed with patient.  Follow-up sooner than planned if symptoms do not continue to improve or you have additional concerns/questions.

## 2016-06-03 DIAGNOSIS — N200 Calculus of kidney: Secondary | ICD-10-CM | POA: Diagnosis not present

## 2016-06-03 DIAGNOSIS — Q625 Duplication of ureter: Secondary | ICD-10-CM | POA: Diagnosis not present

## 2016-06-24 DIAGNOSIS — R5382 Chronic fatigue, unspecified: Secondary | ICD-10-CM | POA: Diagnosis not present

## 2016-06-24 DIAGNOSIS — E039 Hypothyroidism, unspecified: Secondary | ICD-10-CM | POA: Diagnosis not present

## 2016-07-10 ENCOUNTER — Telehealth: Payer: Self-pay | Admitting: Rheumatology

## 2016-07-10 NOTE — Telephone Encounter (Signed)
Patient advised she can her lab work performed at her appointment on next Friday.

## 2016-07-10 NOTE — Telephone Encounter (Signed)
Patient called wanting to know if she needs to get her lab work done before her appointment next Friday with Dr. Corliss Skainseveshwar.  CB#[phone].  Thank you

## 2016-07-15 NOTE — Progress Notes (Signed)
Office Visit Note  Patient: Marissa Larson             Date of Birth: 1955/05/12           MRN: [id number]             PCP: Thomasene Lot, DO Referring: Thomasene Lot, DO Visit Date: 07/18/2016 Occupation: @GUAROCC @    Subjective:  Pain knee joints.   History of Present Illness: Marissa Larson is a 61 y.o. female with history of pseudogout and osteoarthritis. She has noticed 80% improvement in her symptoms since she started taking colchicine. She states she's able to walk and do routine activities without much discomfort now. She still continues to have some pain in her bilateral knee joints in her hands. She still have some discomfort in her right hip in her lower back.   Activities of Daily Living:  Patient reports morning stiffness for 10 minutes.   Patient Reports nocturnal pain.  Difficulty dressing/grooming: Denies Difficulty climbing stairs: Reports Difficulty getting out of chair: Reports Difficulty using hands for taps, buttons, cutlery, and/or writing: Reports   Review of Systems  Constitutional: Positive for fatigue. Negative for night sweats, weight gain, weight loss and weakness.  HENT: Positive for mouth dryness. Negative for mouth sores, trouble swallowing, trouble swallowing and nose dryness.   Eyes: Negative for pain, redness and visual disturbance.  Respiratory: Negative for cough, shortness of breath and difficulty breathing.   Cardiovascular: Negative for chest pain, palpitations, hypertension, irregular heartbeat and swelling in legs/feet.  Gastrointestinal: Negative for blood in stool, constipation and diarrhea.  Endocrine: Negative for increased urination.  Genitourinary: Negative for vaginal dryness.  Musculoskeletal: Positive for arthralgias, joint pain and morning stiffness. Negative for joint swelling, myalgias, muscle weakness, muscle tenderness and myalgias.  Skin: Negative for color change, rash, hair loss, skin tightness, ulcers and  sensitivity to sunlight.  Allergic/Immunologic: Negative for susceptible to infections.  Neurological: Negative for dizziness, memory loss and night sweats.  Hematological: Negative for swollen glands.  Psychiatric/Behavioral: Positive for sleep disturbance. Negative for depressed mood. The patient is nervous/anxious.     PMFS History:  Patient Active Problem List   Diagnosis Date Noted  . Primary osteoarthritis of both feet 04/12/2016  . History of kidney stones 04/12/2016  . Primary osteoarthritis of both hands 04/11/2016  . Bilateral primary osteoarthritis of hip 04/11/2016  . Primary osteoarthritis of both knees 04/11/2016  . Spondylosis of lumbar region without myelopathy or radiculopathy 04/11/2016  . Hyperuricemia 04/11/2016  . Primary insomnia 04/11/2016  . Flank pain 04/02/2016  . Sinusitis 04/02/2016  . Elevated HDL 11/11/2015  . Elevated LFTs 11/11/2015  . Adjustment disorder with mixed anxiety and depressed mood 11/11/2015  . Dysuria 11/11/2015  . Hypothyroidism 10/14/2015  . Pseudogout involving multiple joints 10/14/2015  . Vitamin D deficiency 10/14/2015  . Hormone replacement therapy- per GYN 10/14/2015  . HTN (hypertension) 10/14/2015  . Fatigue 10/14/2015  . h/o Hiatal hernia 10/14/2015  . Sleep difficulties 10/14/2015  . Generalized OA 10/14/2015  . Counseling on health promotion and disease prevention 10/14/2015  . 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 Age-related nuclear cataract of both eyes 01/25/2015  . Posterior vitreous detachment of left eye 01/25/2015  . Overweight 01/18/2014  . chronic Palpitations 07/19/2013    Past Medical History:  Diagnosis Date  . Arthritis   . Chest pain 06/2013   Left sided  . Chronic leg pain 06/2013  . Fatigue   . Nonproductive cough 06/2013  .  Sleep difficulties    Change in sleep patterns  . SOB (shortness of breath) 06/2013  . Tachycardia 06/2013  . Thyroid disease    hypo  . UTI (lower urinary tract  infection)     Family History  Problem Relation Age of Onset  . Migraines Other        Fam Hx   . Heart disease Other        Fam Hx  . Thyroid disease Other        Fam Hx - She also has thyroid disease  . Diabetes Other        Fam Hx  . Arthritis Other        Fam Hx  . Gallbladder disease Other        Fam Hx  . Cancer Other        Fam Hx - Pancreatic  . Hypertension Other        Fam Hx  . Heart attack Cousin        Multiple cousins with MI's before 9 years of age  . Heart disease Mother 80  . Hyperlipidemia Mother 10  . Hypertension Mother 1  . Stroke Mother 70  . Heart disease Maternal Grandmother   . Cancer Father 67       pancreatic  . Diabetes Brother 50  . Heart attack Maternal Uncle   . Heart attack Paternal Aunt    Past Surgical History:  Procedure Laterality Date  . ABDOMINAL SURGERY  1994  . FOOT SURGERY Right 2009  . KIDNEY SURGERY Left 1993  . KNEE SURGERY     Multiple knee surgeries  . OOPHORECTOMY Left 1994  . URETEROSCOPY VIA URETEROSTOMY Left 1994   Social History   Social History Narrative  . No narrative on file     Objective: Vital Signs: BP (!) 148/88 (BP Location: Left Arm, Patient Position: Sitting, Cuff Size: Normal)   Pulse 83   Resp 16   Ht 5\' 6"  (1.676 m)   Wt 198 lb (89.8 kg)   BMI 31.96 kg/m    Physical Exam  Constitutional: She is oriented to person, place, and time. She appears well-developed and well-nourished.  HENT:  Head: Normocephalic and atraumatic.  Eyes: Conjunctivae and EOM are normal.  Neck: Normal range of motion.  Cardiovascular: Normal rate, regular rhythm, normal heart sounds and intact distal pulses.   Pulmonary/Chest: Effort normal and breath sounds normal.  Abdominal: Soft. Bowel sounds are normal.  Lymphadenopathy:    She has no cervical adenopathy.  Neurological: She is alert and oriented to person, place, and time.  Skin: Skin is warm and dry. Capillary refill takes less than 2 seconds.    Psychiatric: She has a normal mood and affect. Her behavior is normal.  Nursing note and vitals reviewed.    Musculoskeletal Exam: C-spine and thoracic spine good range of motion. She has discomfort range of motion of her lumbar spine. Shoulder joints elbow joints wrist joints are good range of motion. She has DIP PIP thickening in her hands consistent with osteoarthritis without any synovitis. She has limited range of motion of bilateral hip joints with some discomfort. She is also discomfort range of motion of her bilateral knee joints. No warmth swelling or effusion was noted. Ankle joints with good range of motion. She has some feet changes consistent with osteoarthritis.  CDAI Exam: No CDAI exam completed.    Investigation: No additional findings. CBC Latest Ref Rng & Units 10/10/2015 06/17/2013  WBC 3.8 - 10.8 K/uL 9.0 9.4  Hemoglobin 11.7 - 15.5 g/dL 16.114.7 09.614.6  Hematocrit 04.535.0 - 45.0 % 43.9 43.0  Platelets 140 - 400 K/uL 282 254   CMP Latest Ref Rng & Units 10/10/2015 05/04/2015 06/17/2013  Glucose 65 - 99 mg/dL 97 - 99  BUN 7 - 25 mg/dL 12 - 16  Creatinine 4.090.50 - 1.05 mg/dL 8.110.62 - 9.140.55  Sodium 782135 - 146 mmol/L 140 - 144  Potassium 3.5 - 5.3 mmol/L 4.1 - 4.2  Chloride 98 - 110 mmol/L 101 - 104  CO2 20 - 31 mmol/L 28 - 26  Calcium 8.6 - 10.4 mg/dL 9.8 - 9.9  Total Protein 6.1 - 8.1 g/dL 7.1 6.5 7.0  Total Bilirubin 0.2 - 1.2 mg/dL 0.6 - 0.3  Alkaline Phos 33 - 130 U/L 79 88 82  AST 10 - 35 U/L 44(H) 30 25  ALT 6 - 29 U/L 36(H) 29 26     Imaging: No results found.  Speciality Comments: No specialty comments available.    Procedures:  No procedures performed Allergies: Advil [ibuprofen]; Penicillins; and Sulfa antibiotics   Assessment / Plan:     Visit Diagnoses: Pseudogout involving multiple joints - crystal proven, on  Colchicine. She's doing much better on colchicine. She states her joint discomfort has improved about 80%. Her mobility has improved. I'll check her  labs today.  Primary osteoarthritis of both hands: Joint protection and muscle strengthening was discussed. Exercises were demonstrated in the office today. A list of natural and temperature is was given.  Primary osteoarthritis of both hips - Right hip joint mild osteoarthritis, left hip joint and moderate osteoarthritis. I've encouraged water aerobics and weight loss.  Primary osteoarthritis of both knees - Status post right knee joint reconstruction, status post left knee joint arthroscopic surgery. She is getting Visco supplement injections through Dr. Thurston HoleWainer. Lower extremity muscle strengthening exercises were discussed. A handout was given.  Primary osteoarthritis of both feet - Status post right foot reconstruction  DDD (degenerative disc disease), lumbar: She continues to have some lower back pain. I believe weight loss and exercises will be helpful. Handout on lower back exercises was given.  Hyperuricemia: I will add uric acid to her lab work per request.  History of kidney stones - On allopurinol  Other fatigue  Primary insomnia  Vitamin D deficiency: We'll check vitamin D levels today.  Elevated LFTs: Uncertain etiology we'll check her labs again today.  History of peptic ulcer disease  History of hypertension    Orders: No orders of the defined types were placed in this encounter.  No orders of the defined types were placed in this encounter.   Face-to-face time spent with patient was 30 minutes. 50% of time was spent in counseling and coordination of care.  Follow-Up Instructions: Return for CPPD OA .   Pollyann SavoyShaili Deveshwar, MD  Note - This record has been created using Animal nutritionistDragon software.  Chart creation errors have been sought, but may not always  have been located. Such creation errors do not reflect on  the standard of medical care.

## 2016-07-18 ENCOUNTER — Ambulatory Visit (INDEPENDENT_AMBULATORY_CARE_PROVIDER_SITE_OTHER): Payer: BLUE CROSS/BLUE SHIELD | Admitting: Rheumatology

## 2016-07-18 ENCOUNTER — Encounter: Payer: Self-pay | Admitting: Rheumatology

## 2016-07-18 VITALS — BP 148/88 | HR 83 | Resp 16 | Ht 66.0 in | Wt 198.0 lb

## 2016-07-18 DIAGNOSIS — M160 Bilateral primary osteoarthritis of hip: Secondary | ICD-10-CM

## 2016-07-18 DIAGNOSIS — E790 Hyperuricemia without signs of inflammatory arthritis and tophaceous disease: Secondary | ICD-10-CM

## 2016-07-18 DIAGNOSIS — R7989 Other specified abnormal findings of blood chemistry: Secondary | ICD-10-CM | POA: Diagnosis not present

## 2016-07-18 DIAGNOSIS — E559 Vitamin D deficiency, unspecified: Secondary | ICD-10-CM

## 2016-07-18 DIAGNOSIS — M5136 Other intervertebral disc degeneration, lumbar region: Secondary | ICD-10-CM | POA: Diagnosis not present

## 2016-07-18 DIAGNOSIS — M19042 Primary osteoarthritis, left hand: Secondary | ICD-10-CM

## 2016-07-18 DIAGNOSIS — F5101 Primary insomnia: Secondary | ICD-10-CM | POA: Diagnosis not present

## 2016-07-18 DIAGNOSIS — Z5181 Encounter for therapeutic drug level monitoring: Secondary | ICD-10-CM

## 2016-07-18 DIAGNOSIS — M170 Bilateral primary osteoarthritis of knee: Secondary | ICD-10-CM | POA: Diagnosis not present

## 2016-07-18 DIAGNOSIS — R945 Abnormal results of liver function studies: Secondary | ICD-10-CM

## 2016-07-18 DIAGNOSIS — Z87442 Personal history of urinary calculi: Secondary | ICD-10-CM

## 2016-07-18 DIAGNOSIS — M19041 Primary osteoarthritis, right hand: Secondary | ICD-10-CM

## 2016-07-18 DIAGNOSIS — M19071 Primary osteoarthritis, right ankle and foot: Secondary | ICD-10-CM

## 2016-07-18 DIAGNOSIS — M1189 Other specified crystal arthropathies, multiple sites: Secondary | ICD-10-CM

## 2016-07-18 DIAGNOSIS — Z8679 Personal history of other diseases of the circulatory system: Secondary | ICD-10-CM

## 2016-07-18 DIAGNOSIS — R5383 Other fatigue: Secondary | ICD-10-CM | POA: Diagnosis not present

## 2016-07-18 DIAGNOSIS — Z8711 Personal history of peptic ulcer disease: Secondary | ICD-10-CM

## 2016-07-18 DIAGNOSIS — M19072 Primary osteoarthritis, left ankle and foot: Secondary | ICD-10-CM

## 2016-07-18 LAB — CBC WITH DIFFERENTIAL/PLATELET
Basophils Absolute: 76 cells/uL (ref 0–200)
Basophils Relative: 1 %
EOS PCT: 2 %
Eosinophils Absolute: 152 cells/uL (ref 15–500)
HCT: 45.3 % — ABNORMAL HIGH (ref 35.0–45.0)
HEMOGLOBIN: 15.5 g/dL (ref 11.7–15.5)
LYMPHS PCT: 37 %
Lymphs Abs: 2812 cells/uL (ref 850–3900)
MCH: 32.3 pg (ref 27.0–33.0)
MCHC: 34.2 g/dL (ref 32.0–36.0)
MCV: 94.4 fL (ref 80.0–100.0)
MONO ABS: 456 {cells}/uL (ref 200–950)
MPV: 9.9 fL (ref 7.5–12.5)
Monocytes Relative: 6 %
NEUTROS ABS: 4104 {cells}/uL (ref 1500–7800)
Neutrophils Relative %: 54 %
Platelets: 263 10*3/uL (ref 140–400)
RBC: 4.8 MIL/uL (ref 3.80–5.10)
RDW: 13.5 % (ref 11.0–15.0)
WBC: 7.6 10*3/uL (ref 3.8–10.8)

## 2016-07-18 LAB — COMPLETE METABOLIC PANEL WITH GFR
ALBUMIN: 4.5 g/dL (ref 3.6–5.1)
ALK PHOS: 70 U/L (ref 33–130)
ALT: 67 U/L — ABNORMAL HIGH (ref 6–29)
AST: 59 U/L — ABNORMAL HIGH (ref 10–35)
BILIRUBIN TOTAL: 0.4 mg/dL (ref 0.2–1.2)
BUN: 11 mg/dL (ref 7–25)
CO2: 30 mmol/L (ref 20–31)
Calcium: 9.4 mg/dL (ref 8.6–10.4)
Chloride: 101 mmol/L (ref 98–110)
Creat: 0.63 mg/dL (ref 0.50–1.05)
Glucose, Bld: 88 mg/dL (ref 65–99)
POTASSIUM: 3.7 mmol/L (ref 3.5–5.3)
Sodium: 140 mmol/L (ref 135–146)
TOTAL PROTEIN: 6.9 g/dL (ref 6.1–8.1)

## 2016-07-18 NOTE — Patient Instructions (Signed)
Knee Exercises Ask your health care provider which exercises are safe for you. Do exercises exactly as told by your health care provider and adjust them as directed. It is normal to feel mild stretching, pulling, tightness, or discomfort as you do these exercises, but you should stop right away if you feel sudden pain or your pain gets worse.Do not begin these exercises until told by your health care provider. STRETCHING AND RANGE OF MOTION EXERCISES These exercises warm up your muscles and joints and improve the movement and flexibility of your knee. These exercises also help to relieve pain, numbness, and tingling. Exercise A: Knee Extension, Prone 1. Lie on your abdomen on a bed. 2. Place your left / right knee just beyond the edge of the surface so your knee is not on the bed. You can put a towel under your left / right thigh just above your knee for comfort. 3. Relax your leg muscles and allow gravity to straighten your knee. You should feel a stretch behind your left / right knee. 4. Hold this position for __________ seconds. 5. Scoot up so your knee is supported between repetitions. Repeat __________ times. Complete this stretch __________ times a day. Exercise B: Knee Flexion, Active  1. Lie on your back with both knees straight. If this causes back discomfort, bend your left / right knee so your foot is flat on the floor. 2. Slowly slide your left / right heel back toward your buttocks until you feel a gentle stretch in the front of your knee or thigh. 3. Hold this position for __________ seconds. 4. Slowly slide your left / right heel back to the starting position. Repeat __________ times. Complete this exercise __________ times a day. Exercise C: Quadriceps, Prone  1. Lie on your abdomen on a firm surface, such as a bed or padded floor. 2. Bend your left / right knee and hold your ankle. If you cannot reach your ankle or pant leg, loop a belt around your foot and grab the belt  instead. 3. Gently pull your heel toward your buttocks. Your knee should not slide out to the side. You should feel a stretch in the front of your thigh and knee. 4. Hold this position for __________ seconds. Repeat __________ times. Complete this stretch __________ times a day. Exercise D: Hamstring, Supine 1. Lie on your back. 2. Loop a belt or towel over the ball of your left / right foot. The ball of your foot is on the walking surface, right under your toes. 3. Straighten your left / right knee and slowly pull on the belt to raise your leg until you feel a gentle stretch behind your knee. ? Do not let your left / right knee bend while you do this. ? Keep your other leg flat on the floor. 4. Hold this position for __________ seconds. Repeat __________ times. Complete this stretch __________ times a day. STRENGTHENING EXERCISES These exercises build strength and endurance in your knee. Endurance is the ability to use your muscles for a long time, even after they get tired. Exercise E: Quadriceps, Isometric  1. Lie on your back with your left / right leg extended and your other knee bent. Put a rolled towel or small pillow under your knee if told by your health care provider. 2. Slowly tense the muscles in the front of your left / right thigh. You should see your kneecap slide up toward your hip or see increased dimpling just above the knee. This   motion will push the back of the knee toward the floor. 3. For __________ seconds, keep the muscle as tight as you can without increasing your pain. 4. Relax the muscles slowly and completely. Repeat __________ times. Complete this exercise __________ times a day. Exercise F: Straight Leg Raises - Quadriceps 1. Lie on your back with your left / right leg extended and your other knee bent. 2. Tense the muscles in the front of your left / right thigh. You should see your kneecap slide up or see increased dimpling just above the knee. Your thigh may  even shake a bit. 3. Keep these muscles tight as you raise your leg 4-6 inches (10-15 cm) off the floor. Do not let your knee bend. 4. Hold this position for __________ seconds. 5. Keep these muscles tense as you lower your leg. 6. Relax your muscles slowly and completely after each repetition. Repeat __________ times. Complete this exercise __________ times a day. Exercise G: Hamstring, Isometric 1. Lie on your back on a firm surface. 2. Bend your left / right knee approximately __________ degrees. 3. Dig your left / right heel into the surface as if you are trying to pull it toward your buttocks. Tighten the muscles in the back of your thighs to dig as hard as you can without increasing any pain. 4. Hold this position for __________ seconds. 5. Release the tension gradually and allow your muscles to relax completely for __________ seconds after each repetition. Repeat __________ times. Complete this exercise __________ times a day. Exercise H: Hamstring Curls  If told by your health care provider, do this exercise while wearing ankle weights. Begin with __________ weights. Then increase the weight by 1 lb (0.5 kg) increments. Do not wear ankle weights that are more than __________. 1. Lie on your abdomen with your legs straight. 2. Bend your left / right knee as far as you can without feeling pain. Keep your hips flat against the floor. 3. Hold this position for __________ seconds. 4. Slowly lower your leg to the starting position.  Repeat __________ times. Complete this exercise __________ times a day. Exercise I: Squats (Quadriceps) 1. Stand in front of a table, with your feet and knees pointing straight ahead. You may rest your hands on the table for balance but not for support. 2. Slowly bend your knees and lower your hips like you are going to sit in a chair. ? Keep your weight over your heels, not over your toes. ? Keep your lower legs upright so they are parallel with the table  legs. ? Do not let your hips go lower than your knees. ? Do not bend lower than told by your health care provider. ? If your knee pain increases, do not bend as low. 3. Hold the squat position for __________ seconds. 4. Slowly push with your legs to return to standing. Do not use your hands to pull yourself to standing. Repeat __________ times. Complete this exercise __________ times a day. Exercise J: Wall Slides (Quadriceps)  1. Lean your back against a smooth wall or door while you walk your feet out 18-24 inches (46-61 cm) from it. 2. Place your feet hip-width apart. 3. Slowly slide down the wall or door until your knees bend __________ degrees. Keep your knees over your heels, not over your toes. Keep your knees in line with your hips. 4. Hold for __________ seconds. Repeat __________ times. Complete this exercise __________ times a day. Exercise K: Straight Leg Raises -   Hip Abductors 1. Lie on your side with your left / right leg in the top position. Lie so your head, shoulder, knee, and hip line up. You may bend your bottom knee to help you keep your balance. 2. Roll your hips slightly forward so your hips are stacked directly over each other and your left / right knee is facing forward. 3. Leading with your heel, lift your top leg 4-6 inches (10-15 cm). You should feel the muscles in your outer hip lifting. ? Do not let your foot drift forward. ? Do not let your knee roll toward the ceiling. 4. Hold this position for __________ seconds. 5. Slowly return your leg to the starting position. 6. Let your muscles relax completely after each repetition. Repeat __________ times. Complete this exercise __________ times a day. Exercise L: Straight Leg Raises - Hip Extensors 1. Lie on your abdomen on a firm surface. You can put a pillow under your hips if that is more comfortable. 2. Tense the muscles in your buttocks and lift your left / right leg about 4-6 inches (10-15 cm). Keep your knee  straight as you lift your leg. 3. Hold this position for __________ seconds. 4. Slowly lower your leg to the starting position. 5. Let your leg relax completely after each repetition. Repeat __________ times. Complete this exercise __________ times a day. This information is not intended to replace advice given to you by your health care provider. Make sure you discuss any questions you have with your health care provider. Document Released: 11/06/2004 Document Revised: 09/17/2015 Document Reviewed: 10/29/2014 [employer] Interactive Patient Education  2018 [employer] Back Exercises The following exercises strengthen the muscles that help to support the back. They also help to keep the lower back flexible. Doing these exercises can help to prevent back pain or lessen existing pain. If you have back pain or discomfort, try doing these exercises 2-3 times each day or as told by your health care provider. When the pain goes away, do them once each day, but increase the number of times that you repeat the steps for each exercise (do more repetitions). If you do not have back pain or discomfort, do these exercises once each day or as told by your health care provider. Exercises Single Knee to Chest  Repeat these steps 3-5 times for each leg: 6. Lie on your back on a firm bed or the floor with your legs extended. 7. Bring one knee to your chest. Your other leg should stay extended and in contact with the floor. 8. Hold your knee in place by grabbing your knee or thigh. 9. Pull on your knee until you feel a gentle stretch in your lower back. 10. Hold the stretch for 10-30 seconds. 11. Slowly release and straighten your leg.  Pelvic Tilt  Repeat these steps 5-10 times: 5. Lie on your back on a firm bed or the floor with your legs extended. 6. Bend your knees so they are pointing toward the ceiling and your feet are flat on the floor. 7. Tighten your lower abdominal muscles to press your lower  back against the floor. This motion will tilt your pelvis so your tailbone points up toward the ceiling instead of pointing to your feet or the floor. 8. With gentle tension and even breathing, hold this position for 5-10 seconds.  Cat-Cow  Repeat these steps until your lower back becomes more flexible: 5. Get into a hands-and-knees position on a firm surface. Keep your hands under   your shoulders, and keep your knees under your hips. You may place padding under your knees for comfort. 6. Let your head hang down, and point your tailbone toward the floor so your lower back becomes rounded like the back of a cat. 7. Hold this position for 5 seconds. 8. Slowly lift your head and point your tailbone up toward the ceiling so your back forms a sagging arch like the back of a cow. 9. Hold this position for 5 seconds.  Press-Ups  Repeat these steps 5-10 times: 1. Lie on your abdomen (face-down) on the floor. 2. Place your palms near your head, about shoulder-width apart. 3. While you keep your back as relaxed as possible and keep your hips on the floor, slowly straighten your arms to raise the top half of your body and lift your shoulders. Do not use your back muscles to raise your upper torso. You may adjust the placement of your hands to make yourself more comfortable. 4. Hold this position for 5 seconds while you keep your back relaxed. 5. Slowly return to lying flat on the floor.  Bridges  Repeat these steps 10 times: 5. Lie on your back on a firm surface. 6. Bend your knees so they are pointing toward the ceiling and your feet are flat on the floor. 7. Tighten your buttocks muscles and lift your buttocks off of the floor until your waist is at almost the same height as your knees. You should feel the muscles working in your buttocks and the back of your thighs. If you do not feel these muscles, slide your feet 1-2 inches farther away from your buttocks. 8. Hold this position for 3-5  seconds. 9. Slowly lower your hips to the starting position, and allow your buttocks muscles to relax completely.  If this exercise is too easy, try doing it with your arms crossed over your chest. Abdominal Crunches  Repeat these steps 5-10 times: 7. Lie on your back on a firm bed or the floor with your legs extended. 8. Bend your knees so they are pointing toward the ceiling and your feet are flat on the floor. 9. Cross your arms over your chest. 10. Tip your chin slightly toward your chest without bending your neck. 11. Tighten your abdominal muscles and slowly raise your trunk (torso) high enough to lift your shoulder blades a tiny bit off of the floor. Avoid raising your torso higher than that, because it can put too much stress on your low back and it does not help to strengthen your abdominal muscles. 12. Slowly return to your starting position.  Back Lifts Repeat these steps 5-10 times: 6. Lie on your abdomen (face-down) with your arms at your sides, and rest your forehead on the floor. 7. Tighten the muscles in your legs and your buttocks. 8. Slowly lift your chest off of the floor while you keep your hips pressed to the floor. Keep the back of your head in line with the curve in your back. Your eyes should be looking at the floor. 9. Hold this position for 3-5 seconds. 10. Slowly return to your starting position.  Contact a health care provider if:  Your back pain or discomfort gets much worse when you do an exercise.  Your back pain or discomfort does not lessen within 2 hours after you exercise. If you have any of these problems, stop doing these exercises right away. Do not do them again unless your health care provider says that you can.   Get help right away if:  You develop sudden, severe back pain. If this happens, stop doing the exercises right away. Do not do them again unless your health care provider says that you can. This information is not intended to replace  advice given to you by your health care provider. Make sure you discuss any questions you have with your health care provider. Document Released: 01/31/2004 Document Revised: 05/02/2015 Document Reviewed: 02/16/2014 [employer] Interactive Patient Education  2017 [employer] Natural anti-inflammatories  You can purchase these at [employer], [employer] or online.  . Turmeric (capsules)  . Ginger (ginger root or capsules)  . Omega 3 (Fish, flax seeds, chia seeds, walnuts, almonds)  . Tart cherry (dried or extract)   Patient should be under the care of a physician while taking these supplements. This may not be reproduced without the permission of Dr. Pollyann SavoyShaili Miller [place].

## 2016-07-19 LAB — VITAMIN D 25 HYDROXY (VIT D DEFICIENCY, FRACTURES): VIT D 25 HYDROXY: 30 ng/mL (ref 30–100)

## 2016-07-19 LAB — URIC ACID: Uric Acid, Serum: 4.4 mg/dL (ref 2.5–7.0)

## 2016-07-19 NOTE — Progress Notes (Signed)
Vit D is low. Call in Vit D 50,000 U q week#90d. Recheck Vit D level in 3 months. LFTs are higher. Please advice patient to see PCP for evaluation of elevated LFTs. Fax labs to PCP. Advice no NSAIDS or alcohol intake.

## 2016-07-23 ENCOUNTER — Telehealth: Payer: Self-pay | Admitting: *Deleted

## 2016-07-23 NOTE — Telephone Encounter (Signed)
Patient advised of lab results and recommendations. Patient verbalized understanding. Patient states she already has a prescription for the Vitamin D 1610950000 IU and does not need a prescription at this time.

## 2016-07-23 NOTE — Telephone Encounter (Signed)
Patient was returning Andrea's call.  Cb# is [phone].

## 2016-07-23 NOTE — Telephone Encounter (Signed)
Attempted to contact the patient and left message for patient to call the office.  

## 2016-07-23 NOTE — Telephone Encounter (Signed)
-----   Message from Pollyann SavoyShaili Deveshwar, MD sent at 07/19/2016  9:13 PM EDT ----- Vit D is low. Call in Vit D 50,000 U q week#90d. Recheck Vit D level in 3 months. LFTs are higher. Please advice patient to see PCP for evaluation of elevated LFTs. Fax labs to PCP. Advice no NSAIDS or alcohol intake.

## 2016-07-25 DIAGNOSIS — H524 Presbyopia: Secondary | ICD-10-CM | POA: Diagnosis not present

## 2016-07-25 DIAGNOSIS — H43813 Vitreous degeneration, bilateral: Secondary | ICD-10-CM | POA: Diagnosis not present

## 2016-07-25 DIAGNOSIS — H52203 Unspecified astigmatism, bilateral: Secondary | ICD-10-CM | POA: Diagnosis not present

## 2016-07-28 ENCOUNTER — Telehealth: Payer: Self-pay | Admitting: Rheumatology

## 2016-07-28 NOTE — Telephone Encounter (Signed)
Patient would like her lab results faxed to Dr. Kinnie ScalesMedoff. fax# [phone].

## 2016-07-28 NOTE — Telephone Encounter (Signed)
Labs faxed to Dr. Kinnie ScalesMedoff as requested.

## 2016-07-30 ENCOUNTER — Ambulatory Visit (INDEPENDENT_AMBULATORY_CARE_PROVIDER_SITE_OTHER): Payer: BLUE CROSS/BLUE SHIELD | Admitting: Family Medicine

## 2016-07-30 ENCOUNTER — Encounter: Payer: Self-pay | Admitting: Family Medicine

## 2016-07-30 DIAGNOSIS — K760 Fatty (change of) liver, not elsewhere classified: Secondary | ICD-10-CM

## 2016-07-30 DIAGNOSIS — R945 Abnormal results of liver function studies: Secondary | ICD-10-CM

## 2016-07-30 DIAGNOSIS — Z79899 Other long term (current) drug therapy: Secondary | ICD-10-CM

## 2016-07-30 DIAGNOSIS — R7989 Other specified abnormal findings of blood chemistry: Secondary | ICD-10-CM

## 2016-07-30 DIAGNOSIS — R748 Abnormal levels of other serum enzymes: Secondary | ICD-10-CM

## 2016-07-30 NOTE — Patient Instructions (Addendum)
She has appt with Madoff in couple of weeks.  --> Since you've had abdominal ultrasound hepatic panel etc. and in a workup for elevated liver enzymes with your gastroenterologist Dr. Dot BeenMiddaugh in the past many years ago, there is nothing from a primary care standpoint that I feel we need to do right now for you.  I would recommend you talk to Dr. Kinnie ScalesMedoff and let him know about this and see if there is anything he recommends and I recommend we monitor your LFTs in about 3-4 months  Avoid alcohol, Tylenol or any other substances you put in your body that could be metabolized by the liver.  This could likely be additive effect from the allopurinol plus colchicine together on a regular basis.  Also consider milk thistle to help your liver.

## 2016-07-30 NOTE — Progress Notes (Signed)
Impression and Recommendations:    1. Elevated liver enzymes   2. High risk medication use   3. Fatty liver disease, nonalcoholic   4. Elevated LFTs    She has appt with Madoff in couple of weeks.  --> Since you've had abdominal ultrasound hepatic panel etc. and in a workup for elevated liver enzymes with your gastroenterologist Dr. Dot BeenMiddaugh in the past many years ago, there is nothing from a primary care standpoint that I feel we need to do right now for you.  I would recommend you talk to Dr. Kinnie ScalesMedoff and let him know about this and see if there is anything he recommends and I recommend we monitor your LFTs in about 3-4 months  Avoid alcohol, Tylenol or any other substances you put in your body that could be metabolized by the liver.  This could likely be additive effect from the allopurinol plus colchicine together on a regular basis.  Also consider milk thistle to help your liver.  The patient was counseled, risk factors were discussed, anticipatory guidance given.  Gross side effects, risk and benefits, and alternatives of medications and treatment plan in general discussed with patient.  Patient is aware that all medications have potential side effects and we are unable to predict every side effect or drug-drug interaction that may occur.   Patient will call with any questions prior to using medication if they have concerns.  Expresses verbal understanding and consents to current therapy and treatment regimen.  No barriers to understanding were identified.  Red flag symptoms and signs discussed in detail.  Patient expressed understanding regarding what to do in case of emergency\urgent symptoms  Please see AVS handed out to patient at the end of our visit for further patient instructions/ counseling done pertaining to today's office visit.   Return in about 3 months (around 10/30/2016) for 3-4 mo- rtc for f/up with me, bldwrk - LFT's,.     Note: This document was prepared  using Dragon voice recognition software and may include unintentional dictation errors.  Thomasene Loteborah Itai Barbian 3:31 PM --------------------------------------------------------------------------------------------------------------------------------------------------------------------------------------------------------------------------------------------    Subjective:    CC:  Chief Complaint  Patient presents with  . Elevated Hepatic Enzymes    HPI: Marissa Larson is a 61 y.o. female who [facility] today for issues as discussed below.  Colchicine daily for her pain by Devnshar--> feeling 80% improvement with pain- now can stand up straight.     -  Dr Kinnie ScalesMedoff of GI  - checked Hep panel, Abd US, Celiac dx etc in past--> all negative    Problem  Fatty Liver Disease, Nonalcoholic  Elevated Lfts  Adjustment Disorder With Mixed Anxiety and Depressed Mood  Pseudogout Involving Multiple Joints  Htn (Hypertension)  Elevated Hdl  Hypothyroidism  Generalized Oa  Gastric ulcer- due to Mobic  Overweight  Hyperuricemia  Primary Insomnia  Vitamin D Deficiency  Fatigue  Sleep Difficulties   Change in sleep patterns   High Risk Medication Use     Wt Readings from Last 3 Encounters:  07/18/16 198 lb (89.8 kg)  05/29/16 198 lb (89.8 kg)  04/16/16 202 lb (91.6 kg)   BP Readings from Last 3 Encounters:  07/18/16 (!) 148/88  05/29/16 132/80  04/16/16 126/70   Pulse Readings from Last 3 Encounters:  07/18/16 83  05/29/16 77  04/16/16 78   BMI Readings from Last 3 Encounters:  07/18/16 31.96 kg/m  05/29/16 34.52 kg/m  04/16/16  32.60 kg/m     Patient Care Team    Relationship Specialty Notifications Start End  Thomasene Lotpalski, Dewie Ahart, DO PCP - General Family Medicine  10/09/15   Marinus Mawaylor, Gregg W, MD Consulting Physician Cardiology  10/09/15   Salvatore MarvelWainer, Robert, MD Consulting Physician Orthopedic Surgery  10/09/15   Pollyann Savoyeveshwar, Shaili, MD Consulting  Physician Rheumatology  10/09/15   Jethro Bolusannenbaum, Sigmund, MD Consulting Physician Urology  10/09/15   Julio Sicksurtis, Carol, NP Nurse Practitioner Obstetrics and Gynecology  10/09/15   Altheimer, Casimiro NeedleMichael, MD Consulting Physician Endocrinology  10/09/15   Sharrell KuMedoff, Jeffrey, MD Consulting Physician Gastroenterology  10/09/15      Patient Active Problem List   Diagnosis Date Noted  . Fatty liver disease, nonalcoholic 07/30/2016    Priority: High  . Elevated LFTs 11/11/2015    Priority: High  . Adjustment disorder with mixed anxiety and depressed mood 11/11/2015    Priority: High  . Pseudogout involving multiple joints 10/14/2015    Priority: High  . HTN (hypertension) 10/14/2015    Priority: High  . Elevated HDL 11/11/2015    Priority: Medium  . Hypothyroidism 10/14/2015    Priority: Medium  . Generalized OA 10/14/2015    Priority: Medium  . 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 Overweight 01/18/2014    Priority: Medium  . Hyperuricemia 04/11/2016    Priority: Low  . Primary insomnia 04/11/2016    Priority: Low  . Vitamin D deficiency 10/14/2015    Priority: Low  . Fatigue 10/14/2015    Priority: Low  . Sleep difficulties 10/14/2015    Priority: Low  . High risk medication use 07/30/2016  . Primary osteoarthritis of both feet 04/12/2016  . History of kidney stones 04/12/2016  . Primary osteoarthritis of both hands 04/11/2016  . Bilateral primary osteoarthritis of hip 04/11/2016  . Primary osteoarthritis of both knees 04/11/2016  . Spondylosis of lumbar region without myelopathy or radiculopathy 04/11/2016  . Flank pain 04/02/2016  . Sinusitis 04/02/2016  . Dysuria 11/11/2015  . Hormone replacement therapy- per GYN 10/14/2015  . h/o Hiatal hernia 10/14/2015  . Counseling on health promotion and disease prevention 10/14/2015  . Age-related nuclear cataract of both eyes 01/25/2015  . Posterior vitreous detachment of left eye 01/25/2015  . chronic Palpitations  07/19/2013    Past Medical history, Surgical history, Family history, Social history, Allergies and Medications have been entered into the medical record, reviewed and changed as needed.    Current Meds  Medication Sig  . allopurinol (ZYLOPRIM) 100 MG tablet Take 1 tablet by mouth daily.  Marland Kitchen. amitriptyline (ELAVIL) 25 MG tablet Take 2 tablets (50 mg total) by mouth at bedtime. For sleep as needed.  . Ascorbic Acid (VITAMIN C) 1000 MG tablet Take 1,000 mg by mouth daily.  . colchicine (COLCRYS) 0.6 MG tablet Take 1 tablet (0.6 mg total) by mouth 2 (two) times daily.  Marland Kitchen. escitalopram (LEXAPRO) 10 MG tablet Take 10 mg by mouth daily.  . Estradiol-Estriol-Progesterone (BIEST/PROGESTERONE) CREA Place onto the skin daily.  . Flaxseed, Linseed, (FLAXSEED OIL PO) Take 3 capsules by mouth daily.  . hydrochlorothiazide (MICROZIDE) 12.5 MG capsule Take 1 capsule by mouth daily.  Marland Kitchen. levothyroxine (SYNTHROID, LEVOTHROID) 112 MCG tablet Take 112 mcg by mouth daily before breakfast.  . magnesium gluconate (MAGONATE) 500 MG tablet Take 1,000 mg by mouth daily.  . Melatonin 10 MG CAPS Take 1 capsule by mouth at bedtime.  . Omega-3 Fatty Acids (FISH OIL PO) Take 2  capsules by mouth daily.  . Progesterone Micronized (PROGESTERONE PO) Take 150 mg by mouth daily.  . TURMERIC PO Take 100 mg by mouth daily.  . Vitamin D, Ergocalciferol, (DRISDOL) 50000 units CAPS capsule Take 1 capsule (50,000 Units total) by mouth every 7 (seven) days.    Allergies:  Allergies  Allergen Reactions  . Advil [Ibuprofen] Nausea Only    History of gastric ulcer with hemorrhage secondary to ibuprofen use  . Penicillins Hives and Itching  . Sulfa Antibiotics Hives and Itching    Bactrim     Review of Systems: General:   Denies fever, chills, unexplained weight loss.  Optho/Auditory:   Denies visual changes, blurred vision/LOV Respiratory:   Denies wheeze, DOE more than baseline levels.  Cardiovascular:   Denies chest pain,  palpitations, new onset peripheral edema  Gastrointestinal:   Denies nausea, vomiting, diarrhea, abd pain.  Genitourinary: Denies dysuria, freq/ urgency, flank pain or discharge from genitals.  Endocrine:     Denies hot or cold intolerance, polyuria, polydipsia. Musculoskeletal:   Denies unexplained myalgias, joint swelling, unexplained arthralgias, gait problems.  Skin:  Denies new onset rash, suspicious lesions Neurological:     Denies dizziness, unexplained weakness, numbness  Psychiatric/Behavioral:   Denies mood changes, suicidal or homicidal ideations, hallucinations    Objective:   There were no vitals taken for this visit. There is no height or weight on file to calculate BMI. General:  Well Developed, well nourished, appropriate for stated age.  Neuro:  Alert and oriented,  extra-ocular muscles intact  HEENT:  Normocephalic, atraumatic, neck supple, no carotid bruits appreciated  Skin:  no gross rash, warm, pink. Cardiac:  RRR, S1 S2 Respiratory:  ECTA B/L and A/P, Not using accessory muscles, speaking in full sentences- unlabored. Vascular:  Ext warm, no cyanosis apprec.; cap RF less 2 sec. Psych:  No HI/SI, judgement and insight good, Euthymic mood. Full Affect.

## 2016-09-12 ENCOUNTER — Other Ambulatory Visit: Payer: Self-pay | Admitting: Family Medicine

## 2016-09-12 DIAGNOSIS — R110 Nausea: Secondary | ICD-10-CM | POA: Insufficient documentation

## 2016-09-12 DIAGNOSIS — K760 Fatty (change of) liver, not elsewhere classified: Secondary | ICD-10-CM | POA: Diagnosis not present

## 2016-09-12 DIAGNOSIS — F5101 Primary insomnia: Secondary | ICD-10-CM

## 2016-09-12 DIAGNOSIS — N200 Calculus of kidney: Secondary | ICD-10-CM

## 2016-09-12 HISTORY — DX: Calculus of kidney: N20.0

## 2016-09-22 NOTE — Progress Notes (Signed)
Office Visit Note  Patient: Marissa Larson             Date of Birth: Aug 12, 1955           MRN: [id number]             PCP: Thomasene Lot, DO Referring: Thomasene Lot, DO Visit Date: 10/02/2016 Occupation: @    Subjective:  Chest Pain (has tenderness of sternum and ribs ); Chest Pain; and Medication Management (wants to discuss d/c Allopurinol )   History of Present Illness: Marissa Larson is a 61 y.o. female with history of pseudogout and osteoarthritis she continues to have some arthralgias especially in her knee joints. She states that the colchicine twice a day dosing she does quite well. She's been having some abdominal discomfort and reflux symptoms. She also has elevated LFTs. She went to see Dr. Kinnie Scales and he placed her on Pepcid. He also felt that her symptoms may be related to allopurinol. She's been taking allopurinol for hyperuricemia and renal calcinosis. Her last uric acid was 4.4. She states during her examination Dr. Kinnie Scales palpated her costochondral area and her rib cage which was quite tender.  Activities of Daily Living:  Patient reports morning stiffness for 15 minutes.   Patient Reports nocturnal pain.  Difficulty dressing/grooming: Reports Difficulty climbing stairs: Reports Difficulty getting out of chair: Reports Difficulty using hands for taps, buttons, cutlery, and/or writing: Reports   Review of Systems  Constitutional: Positive for fatigue. Negative for night sweats, weight gain, weight loss and weakness.  HENT: Negative.  Negative for mouth sores, trouble swallowing, trouble swallowing, mouth dryness and nose dryness.   Eyes: Negative.  Negative for pain, redness, visual disturbance and dryness.  Respiratory: Negative.  Negative for cough, shortness of breath and difficulty breathing.   Cardiovascular: Negative for chest pain, palpitations, hypertension, irregular heartbeat and swelling in legs/feet.       Sternal and rib margins has  tenderness  Gastrointestinal: Positive for heartburn and nausea. Negative for abdominal pain, blood in stool, constipation and diarrhea.       Dr Kinnie Scales / has treated with Pepcid, has history of gastritis  Endocrine: Negative.  Negative for increased urination.  Genitourinary: Negative.  Negative for vaginal dryness.  Musculoskeletal: Positive for arthralgias, joint pain and morning stiffness. Negative for joint swelling, myalgias, muscle weakness, muscle tenderness and myalgias.  Skin: Negative.  Negative for color change, rash, hair loss, skin tightness, ulcers and sensitivity to sunlight.  Allergic/Immunologic: Negative.  Negative for susceptible to infections.  Neurological: Negative.  Negative for dizziness, memory loss and night sweats.  Hematological: Negative.  Negative for swollen glands.  Psychiatric/Behavioral: Positive for sleep disturbance. Negative for depressed mood. The patient is nervous/anxious.        Lexapro     PMFS History:  Patient Active Problem List   Diagnosis Date Noted  . High risk medication use 07/30/2016  . Fatty liver disease, nonalcoholic 07/30/2016  . Primary osteoarthritis of both feet 04/12/2016  . History of kidney stones 04/12/2016  . Primary osteoarthritis of both hands 04/11/2016  . Bilateral primary osteoarthritis of hip 04/11/2016  . Primary osteoarthritis of both knees 04/11/2016  . Spondylosis of lumbar region without myelopathy or radiculopathy 04/11/2016  . Hyperuricemia 04/11/2016  . Primary insomnia 04/11/2016  . Flank pain 04/02/2016  . Sinusitis 04/02/2016  . Elevated HDL 11/11/2015  . Elevated LFTs 11/11/2015  . Adjustment disorder with mixed anxiety and depressed mood 11/11/2015  . Dysuria 11/11/2015  . Hypothyroidism  10/14/2015  . Pseudogout involving multiple joints 10/14/2015  . Vitamin D deficiency 10/14/2015  . Hormone replacement therapy- per GYN 10/14/2015  . HTN (hypertension) 10/14/2015  . Fatigue 10/14/2015  . h/o  Hiatal hernia 10/14/2015  . Sleep difficulties 10/14/2015  . Generalized OA 10/14/2015  . Counseling on health promotion and disease prevention 10/14/2015  . 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 Age-related nuclear cataract of both eyes 01/25/2015  . Posterior vitreous detachment of left eye 01/25/2015  . Overweight 01/18/2014  . chronic Palpitations 07/19/2013    Past Medical History:  Diagnosis Date  . Arthritis   . Chest pain 06/2013   Left sided  . Chronic leg pain 06/2013  . Fatigue   . Nonproductive cough 06/2013  . Sleep difficulties    Change in sleep patterns  . SOB (shortness of breath) 06/2013  . Tachycardia 06/2013  . Thyroid disease    hypo  . UTI (lower urinary tract infection)     Family History  Problem Relation Age of Onset  . Migraines Other        Fam Hx   . Heart disease Other        Fam Hx  . Thyroid disease Other        Fam Hx - She also has thyroid disease  . Diabetes Other        Fam Hx  . Arthritis Other        Fam Hx  . Gallbladder disease Other        Fam Hx  . Cancer Other        Fam Hx - Pancreatic  . Hypertension Other        Fam Hx  . Heart attack Cousin        Multiple cousins with MI's before 69 years of age  . Heart disease Mother 64  . Hyperlipidemia Mother 59  . Hypertension Mother 64  . Stroke Mother 1  . Heart disease Maternal Grandmother   . Cancer Father 42       pancreatic  . Diabetes Brother 50  . Heart attack Maternal Uncle   . Heart attack Paternal Aunt    Past Surgical History:  Procedure Laterality Date  . ABDOMINAL SURGERY  1994  . FOOT SURGERY Right 2009  . KIDNEY SURGERY Left 1993  . KNEE SURGERY     Multiple knee surgeries  . OOPHORECTOMY Left 1994  . URETEROSCOPY VIA URETEROSTOMY Left 1994   Social History   Social History Narrative  . No narrative on file     Objective: Vital Signs: BP 120/74   Pulse 74   Resp 16   Ht  (1.651 m)   Wt 196 lb (88.9 kg)   BMI 32.62 kg/m     Physical Exam  Constitutional: She is oriented to person, place, and time. She appears well-developed and well-nourished.  HENT:  Head: Normocephalic and atraumatic.  Eyes: Conjunctivae and EOM are normal.  Neck: Normal range of motion.  Cardiovascular: Normal rate, regular rhythm, normal heart sounds and intact distal pulses.   Pulmonary/Chest: Effort normal and breath sounds normal.  Abdominal: Soft. Bowel sounds are normal.  Lymphadenopathy:    She has no cervical adenopathy.  Neurological: She is alert and oriented to person, place, and time.  Skin: Skin is warm and dry. Capillary refill takes less than 2 seconds.  Psychiatric: She has a normal mood and affect. Her behavior is normal.  Nursing note and vitals  reviewed.    Musculoskeletal Exam: C-spine and thoracic lumbar spine good range of motion. She is some discomfort range of motion of her lumbar spine. She tenderness over trapezius area and gluteal area. She also had some tenderness across costochondral area and rib cage. Shoulder joints elbow joints wrist joints MCPs PIPs DIPs are good range of motion with no synovitis. Hip joints knee joints ankles MTPs PIPs DIPs are good range of motion with no synovitis.  CDAI Exam: No CDAI exam completed.    Investigation: Findings:  Patients GI doctor wants evaluation for costochondral tenderness   CBC Latest Ref Rng & Units 07/18/2016 10/10/2015 06/17/2013  WBC 3.8 - 10.8 K/uL 7.6 9.0 9.4  Hemoglobin 11.7 - 15.5 g/dL 16.1 09.6 04.5  Hematocrit 35.0 - 45.0 % 45.3(H) 43.9 43.0  Platelets 140 - 400 K/uL 263 282 254   CMP Latest Ref Rng & Units 07/18/2016 10/10/2015 05/04/2015  Glucose 65 - 99 mg/dL 88 97 -  BUN 7 - 25 mg/dL 11 12 -  Creatinine 4.09 - 1.05 mg/dL 8.11 9.14 -  Sodium 782 - 146 mmol/L 140 140 -  Potassium 3.5 - 5.3 mmol/L 3.7 4.1 -  Chloride 98 - 110 mmol/L 101 101 -  CO2 20 - 31 mmol/L 30 28 -  Calcium 8.6 - 10.4 mg/dL 9.4 9.8 -  Total Protein 6.1 - 8.1 g/dL 6.9 7.1  6.5  Total Bilirubin 0.2 - 1.2 mg/dL 0.4 0.6 -  Alkaline Phos 33 - 130 U/L 70 79 88  AST 10 - 35 U/L 59(H) 44(H) 30  ALT 6 - 29 U/L 67(H) 36(H) 29   Uric acid 4.4 Imaging: No results found.  Speciality Comments: No specialty comments available.    Procedures:  No procedures performed Allergies: Advil [ibuprofen]; Penicillins; and Sulfa antibiotics   Assessment / Plan:     Visit Diagnoses: Pseudogout involving multiple joints - crystal proven, on  Colchicine 0.6 mg by mouth twice a day. She's doing much better on colchicine. She has occasional arthralgias but no joint swelling.  Primary osteoarthritis of both hands: Minimal discomfort  Bilateral primary osteoarthritis of hip - Mild in the right and moderate in the left. Weight loss diet and exercise was discussed.  Primary osteoarthritis of both knees: She's been trying to exercise.  Primary osteoarthritis of both feet - Status post right foot reconstruction: Some discomfort.  DDD Lumbar spine: Chronic pain.  Myofascial pain: She had tenderness in the costochondral region, trapezius area, gluteal area and trochanteric area. Need for exercise was discussed.  History of kidney stones and hyperuricemia - On allopurinol 100 mg by mouth daily. Patient has not had any kidney stones in a while. Her last uric acid was 4.4. She believes that allopurinol is causing her GI symptoms. She wants to stop the allopurinol. We will recheck her uric acid in December after stopping allopurinol. I made her aware that uric acid levels more than 6 can cause precipitation.  Other fatigue: Secondary to insomnia  Primary insomnia: She has chronic insomnia  Fatty liver disease, nonalcoholic: She is elevated LFTs and has been followed by Dr. Dot Been.  History of peptic ulcer disease: She recently had to Pepcid.  History of hypertension  History of anxiety    Orders: No orders of the defined types were placed in this encounter.  No orders of  the defined types were placed in this encounter.     Follow-Up Instructions: Return in about 6 months (around 04/01/2017) for Pseudogout.   Janalyn Rouse  Estanislado Pandy, MD  Note - This record has been created using Editor, commissioning.  Chart creation errors have been sought, but may not always  have been located. Such creation errors do not reflect on  the standard of medical care.

## 2016-10-02 ENCOUNTER — Encounter: Payer: Self-pay | Admitting: Rheumatology

## 2016-10-02 ENCOUNTER — Ambulatory Visit (INDEPENDENT_AMBULATORY_CARE_PROVIDER_SITE_OTHER): Payer: BLUE CROSS/BLUE SHIELD | Admitting: Rheumatology

## 2016-10-02 VITALS — BP 120/74 | HR 74 | Resp 16 | Ht 65.0 in | Wt 196.0 lb

## 2016-10-02 DIAGNOSIS — Z87442 Personal history of urinary calculi: Secondary | ICD-10-CM

## 2016-10-02 DIAGNOSIS — M19041 Primary osteoarthritis, right hand: Secondary | ICD-10-CM

## 2016-10-02 DIAGNOSIS — F5101 Primary insomnia: Secondary | ICD-10-CM

## 2016-10-02 DIAGNOSIS — Z8711 Personal history of peptic ulcer disease: Secondary | ICD-10-CM

## 2016-10-02 DIAGNOSIS — M47816 Spondylosis without myelopathy or radiculopathy, lumbar region: Secondary | ICD-10-CM

## 2016-10-02 DIAGNOSIS — M170 Bilateral primary osteoarthritis of knee: Secondary | ICD-10-CM

## 2016-10-02 DIAGNOSIS — K760 Fatty (change of) liver, not elsewhere classified: Secondary | ICD-10-CM | POA: Diagnosis not present

## 2016-10-02 DIAGNOSIS — Z8659 Personal history of other mental and behavioral disorders: Secondary | ICD-10-CM

## 2016-10-02 DIAGNOSIS — M160 Bilateral primary osteoarthritis of hip: Secondary | ICD-10-CM

## 2016-10-02 DIAGNOSIS — M1189 Other specified crystal arthropathies, multiple sites: Secondary | ICD-10-CM

## 2016-10-02 DIAGNOSIS — R5383 Other fatigue: Secondary | ICD-10-CM | POA: Diagnosis not present

## 2016-10-02 DIAGNOSIS — M19042 Primary osteoarthritis, left hand: Secondary | ICD-10-CM

## 2016-10-02 DIAGNOSIS — M19071 Primary osteoarthritis, right ankle and foot: Secondary | ICD-10-CM

## 2016-10-02 DIAGNOSIS — Z79899 Other long term (current) drug therapy: Secondary | ICD-10-CM

## 2016-10-02 DIAGNOSIS — Z8679 Personal history of other diseases of the circulatory system: Secondary | ICD-10-CM | POA: Diagnosis not present

## 2016-10-02 DIAGNOSIS — Z1231 Encounter for screening mammogram for malignant neoplasm of breast: Secondary | ICD-10-CM | POA: Diagnosis not present

## 2016-10-02 DIAGNOSIS — M19072 Primary osteoarthritis, left ankle and foot: Secondary | ICD-10-CM

## 2016-10-02 NOTE — Patient Instructions (Signed)
Labs are due in December CBC, CMP and uric acid

## 2016-10-06 ENCOUNTER — Other Ambulatory Visit: Payer: Self-pay | Admitting: Obstetrics & Gynecology

## 2016-10-06 DIAGNOSIS — R928 Other abnormal and inconclusive findings on diagnostic imaging of breast: Secondary | ICD-10-CM

## 2016-10-08 ENCOUNTER — Other Ambulatory Visit: Payer: Self-pay | Admitting: Obstetrics & Gynecology

## 2016-10-08 ENCOUNTER — Ambulatory Visit
Admission: RE | Admit: 2016-10-08 | Discharge: 2016-10-08 | Disposition: A | Discharge status: Home / Self Care | Payer: BLUE CROSS/BLUE SHIELD | Source: Ambulatory Visit | Attending: Obstetrics & Gynecology | Admitting: Obstetrics & Gynecology

## 2016-10-08 DIAGNOSIS — R921 Mammographic calcification found on diagnostic imaging of breast: Secondary | ICD-10-CM | POA: Diagnosis not present

## 2016-10-08 DIAGNOSIS — R928 Other abnormal and inconclusive findings on diagnostic imaging of breast: Secondary | ICD-10-CM

## 2016-10-08 DIAGNOSIS — N6320 Unspecified lump in the left breast, unspecified quadrant: Secondary | ICD-10-CM

## 2016-10-08 DIAGNOSIS — N6489 Other specified disorders of breast: Secondary | ICD-10-CM | POA: Diagnosis not present

## 2016-10-09 ENCOUNTER — Telehealth: Payer: Self-pay | Admitting: Family Medicine

## 2016-10-09 NOTE — Telephone Encounter (Signed)
LVM for pt to call to discuss.  T. Nelson, CMA  

## 2016-10-09 NOTE — Telephone Encounter (Signed)
Patient would like to speak with someone clinical about an upcoming biopsy.

## 2016-10-09 NOTE — Telephone Encounter (Signed)
Patient returned medical assistant call several time they unfortunately were playing phone tag.. Each one busy when the other returned the call--glh

## 2016-10-10 NOTE — Telephone Encounter (Signed)
Pt called stating that she had an abnormal mammogram last week and is having a biopsy.  She is very nervous about this situation and is requesting a short RX for something to "calm her down".   Please advise.  Tiajuana Amass, CMA

## 2016-10-13 ENCOUNTER — Ambulatory Visit
Admission: RE | Admit: 2016-10-13 | Discharge: 2016-10-13 | Disposition: A | Discharge status: Home / Self Care | Payer: BLUE CROSS/BLUE SHIELD | Source: Ambulatory Visit | Attending: Obstetrics & Gynecology | Admitting: Obstetrics & Gynecology

## 2016-10-13 ENCOUNTER — Other Ambulatory Visit: Payer: Self-pay | Admitting: Obstetrics & Gynecology

## 2016-10-13 DIAGNOSIS — R921 Mammographic calcification found on diagnostic imaging of breast: Secondary | ICD-10-CM

## 2016-10-13 DIAGNOSIS — N6032 Fibrosclerosis of left breast: Secondary | ICD-10-CM | POA: Diagnosis not present

## 2016-10-13 DIAGNOSIS — N6342 Unspecified lump in left breast, subareolar: Secondary | ICD-10-CM | POA: Diagnosis not present

## 2016-10-13 DIAGNOSIS — N6320 Unspecified lump in the left breast, unspecified quadrant: Secondary | ICD-10-CM

## 2016-10-13 DIAGNOSIS — D242 Benign neoplasm of left breast: Secondary | ICD-10-CM | POA: Diagnosis not present

## 2016-10-14 ENCOUNTER — Telehealth: Payer: Self-pay | Admitting: Family Medicine

## 2016-10-14 NOTE — Telephone Encounter (Signed)
Patient cld for 2nd time stating medical assistant left message for her to call office--glh

## 2016-10-16 NOTE — Telephone Encounter (Signed)
Attempted to contact pt to advise we have not called her in the last couple of days.  No answer.  Tiajuana Amass, CMA

## 2016-10-22 DIAGNOSIS — Z01419 Encounter for gynecological examination (general) (routine) without abnormal findings: Secondary | ICD-10-CM | POA: Diagnosis not present

## 2016-10-22 DIAGNOSIS — Z6833 Body mass index (BMI) 33.0-33.9, adult: Secondary | ICD-10-CM | POA: Diagnosis not present

## 2016-10-30 ENCOUNTER — Ambulatory Visit: Payer: BLUE CROSS/BLUE SHIELD | Admitting: Family Medicine

## 2016-11-03 DIAGNOSIS — K7581 Nonalcoholic steatohepatitis (NASH): Secondary | ICD-10-CM | POA: Diagnosis not present

## 2016-11-03 DIAGNOSIS — K760 Fatty (change of) liver, not elsewhere classified: Secondary | ICD-10-CM | POA: Diagnosis not present

## 2016-11-11 ENCOUNTER — Other Ambulatory Visit: Payer: Self-pay

## 2016-11-11 DIAGNOSIS — Z87442 Personal history of urinary calculi: Secondary | ICD-10-CM

## 2016-11-11 DIAGNOSIS — Z79899 Other long term (current) drug therapy: Secondary | ICD-10-CM

## 2016-11-11 LAB — CBC WITH DIFFERENTIAL/PLATELET
BASOS PCT: 0.7 %
Basophils Absolute: 67 cells/uL (ref 0–200)
EOS ABS: 143 {cells}/uL (ref 15–500)
Eosinophils Relative: 1.5 %
HEMATOCRIT: 42.7 % (ref 35.0–45.0)
HEMOGLOBIN: 14.9 g/dL (ref 11.7–15.5)
LYMPHS ABS: 3800 {cells}/uL (ref 850–3900)
MCH: 32.1 pg (ref 27.0–33.0)
MCHC: 34.9 g/dL (ref 32.0–36.0)
MCV: 92 fL (ref 80.0–100.0)
MPV: 10.9 fL (ref 7.5–12.5)
Monocytes Relative: 7.6 %
NEUTROS ABS: 4769 {cells}/uL (ref 1500–7800)
Neutrophils Relative %: 50.2 %
Platelets: 240 10*3/uL (ref 140–400)
RBC: 4.64 10*6/uL (ref 3.80–5.10)
RDW: 12.6 % (ref 11.0–15.0)
Total Lymphocyte: 40 %
WBC: 9.5 10*3/uL (ref 3.8–10.8)
WBCMIX: 722 {cells}/uL (ref 200–950)

## 2016-11-11 LAB — COMPLETE METABOLIC PANEL WITH GFR
AG Ratio: 1.9 (calc) (ref 1.0–2.5)
ALT: 77 U/L — AB (ref 6–29)
AST: 69 U/L — AB (ref 10–35)
Albumin: 4.4 g/dL (ref 3.6–5.1)
Alkaline phosphatase (APISO): 81 U/L (ref 33–130)
BUN: 15 mg/dL (ref 7–25)
CALCIUM: 9.7 mg/dL (ref 8.6–10.4)
CO2: 28 mmol/L (ref 20–32)
CREATININE: 0.59 mg/dL (ref 0.50–1.05)
Chloride: 100 mmol/L (ref 98–110)
GFR, EST AFRICAN AMERICAN: 116 mL/min/{1.73_m2} (ref >=60)
GFR, EST NON AFRICAN AMERICAN: 100 mL/min/{1.73_m2} (ref >=60)
Globulin: 2.3 g/dL (calc) (ref 1.9–3.7)
Glucose, Bld: 142 mg/dL — ABNORMAL HIGH (ref 65–99)
Potassium: 3.6 mmol/L (ref 3.5–5.3)
Sodium: 139 mmol/L (ref 135–146)
TOTAL PROTEIN: 6.7 g/dL (ref 6.1–8.1)
Total Bilirubin: 0.5 mg/dL (ref 0.2–1.2)

## 2016-11-11 LAB — URIC ACID: URIC ACID, SERUM: 5.6 mg/dL (ref 2.5–7.0)

## 2016-11-12 NOTE — Progress Notes (Signed)
Uric acid isn't desirable range. Her LFTs are still elevated. Not related to current medications. Giving. Please advise her to follow up with her PCP for evaluation of elevated LFTs. Please fax results to her PCP.

## 2016-11-14 ENCOUNTER — Other Ambulatory Visit: Payer: Self-pay | Admitting: Family Medicine

## 2016-11-17 ENCOUNTER — Telehealth: Payer: Self-pay | Admitting: Rheumatology

## 2016-11-17 NOTE — Telephone Encounter (Signed)
Lab results faxed to Dr. Kinnie ScalesMedoff.

## 2016-11-17 NOTE — Telephone Encounter (Signed)
Patient called requesting that her lab results be faxed to Dr. Jennye BoroughsMedoff's office at [phone].  Thank you.

## 2016-11-19 ENCOUNTER — Ambulatory Visit: Payer: BLUE CROSS/BLUE SHIELD | Admitting: Family Medicine

## 2016-12-01 ENCOUNTER — Telehealth (INDEPENDENT_AMBULATORY_CARE_PROVIDER_SITE_OTHER): Payer: Self-pay

## 2016-12-01 ENCOUNTER — Other Ambulatory Visit: Payer: Self-pay | Admitting: Rheumatology

## 2016-12-01 NOTE — Telephone Encounter (Signed)
Patient called stating that Rx was incorrect.  Stated that she takes 2 tablets a day.  Would like a call back concerning Rx for Colcrys.  Cb# is [phone].  Please advise.  Thank you.

## 2016-12-01 NOTE — Telephone Encounter (Signed)
Patient advised we have not filled a prescription for the colchicine since May 2018. Patient advised that it may be a mistake made on her pharmacy's part. Patient will contact the pharmacy and have them submit the proper refill request.

## 2016-12-02 DIAGNOSIS — K760 Fatty (change of) liver, not elsewhere classified: Secondary | ICD-10-CM | POA: Diagnosis not present

## 2016-12-02 DIAGNOSIS — R945 Abnormal results of liver function studies: Secondary | ICD-10-CM | POA: Diagnosis not present

## 2016-12-02 NOTE — Telephone Encounter (Signed)
Last Visit: 10/02/16 Next Visit: 01/20/17 Labs: 11/11/16 Uric acid isn't desirable range. Her LFTs are still elevated. Not related to current medications we give  Okay to refill per Dr. Corliss Skainseveshwar

## 2016-12-03 ENCOUNTER — Encounter: Payer: Self-pay | Admitting: Family Medicine

## 2016-12-03 ENCOUNTER — Ambulatory Visit (INDEPENDENT_AMBULATORY_CARE_PROVIDER_SITE_OTHER): Payer: BLUE CROSS/BLUE SHIELD | Admitting: Family Medicine

## 2016-12-03 VITALS — BP 130/83 | HR 80 | Ht 65.0 in | Wt 192.8 lb

## 2016-12-03 DIAGNOSIS — E669 Obesity, unspecified: Secondary | ICD-10-CM | POA: Insufficient documentation

## 2016-12-03 DIAGNOSIS — M1189 Other specified crystal arthropathies, multiple sites: Secondary | ICD-10-CM | POA: Diagnosis not present

## 2016-12-03 DIAGNOSIS — R7989 Other specified abnormal findings of blood chemistry: Secondary | ICD-10-CM

## 2016-12-03 DIAGNOSIS — K760 Fatty (change of) liver, not elsewhere classified: Secondary | ICD-10-CM

## 2016-12-03 DIAGNOSIS — E66811 Obesity, class 1: Secondary | ICD-10-CM

## 2016-12-03 DIAGNOSIS — F5101 Primary insomnia: Secondary | ICD-10-CM

## 2016-12-03 DIAGNOSIS — E559 Vitamin D deficiency, unspecified: Secondary | ICD-10-CM

## 2016-12-03 DIAGNOSIS — R5383 Other fatigue: Secondary | ICD-10-CM

## 2016-12-03 DIAGNOSIS — R945 Abnormal results of liver function studies: Secondary | ICD-10-CM | POA: Diagnosis not present

## 2016-12-03 NOTE — Patient Instructions (Signed)
Please make appointment for fasting insulin level, fasting lipid profile, T4, T3, TSH, B12, vitamin D in the near future.   Please use the lose it app to track all of your intake.  Continue to use the fit bit to track activity levels.   If you need help on a regular basis I am here for you and here to support you in any way I can.

## 2016-12-03 NOTE — Progress Notes (Signed)
Wt loss OV note   Impression and Recommendations:    1. Obesity, Class I, BMI 30-34.9   2. NAFLD (nonalcoholic fatty liver disease)   3. Pseudogout involving multiple joints   4. Elevated LFTs   5. Vitamin D deficiency   6. Primary insomnia   7. Other fatigue    - Please make appointment for fasting insulin level, fasting lipid profile, T4, T3, TSH, B12, vitamin D in the near future.   - Please use the lose it app to track all of your intake.  Continue to use the fit bit to track activity levels.   If you need help on a regular basis I am here for you and here to support you in any way I can.    Education and routine counseling performed. Handouts provided.  The patient was counseled, risk factors were discussed, anticipatory guidance given.  - Weight Mgt: Explained to patient what BMI refers to, and what it means medically.    Told patient to think about it as a "medical risk stratification measurement" and how increasing BMI is associated with increasing risk/ or worsening state of various diseases such as hypertension, hyperlipidemia, diabetes, premature OA, depression etc.  American Heart Association guidelines for healthy diet, basically Mediterranean diet, and exercise guidelines of 30 minutes 5 days per week or more discussed in detail.  -Reminded patient the need for yearly complete physical exam office visits in addition to office visits for management of the chronic diseases  -Gross side effects, risk and benefits, and alternatives of medications discussed with patient.  Patient is aware that all medications have potential side effects and we are unable to predict every side effect or drug-drug interaction that may occur.  Expresses verbal understanding and consents to current therapy plan and treatment regimen.   Return for Future fasting labs then office visit with me in 1-4 months as needed.   Please see AVS handed out to patient at the end of our visit for  further patient instructions/ counseling done pertaining to today's office visit.    Note: This document was prepared using Dragon voice recognition software and may include unintentional dictation errors.  Thomasene Loteborah Juana Haralson, DO 03/09/2018 3:32 PM   ______________________________________________________________________    Subjective:  HPI: Dell PontoMichele Fowler61 y.o. female  presents for 3 month follow up for multiple medical problems.  No problems updated.  Recently seen by her gastroneurologist Dr. Dot BeenMiddaugh was diagnosed with nonalcoholic fatty liver disease.  York SpanielSaid is very important for her to lose weight, avoid alcohol etc. etc.  She recently bought a fit bit and would like to discuss weight loss.    Per recent note with GI: Dr Kinnie ScalesMedoff:  "HPI: Tilden DomeMichele Waibel is a 61 y.o. female, seen 3 months ago to evaluate chronic abdominal pain, nausea, and elevated LFT's. She has since seen Dr. Corliss Skainseveshwar who treats her for pseudogout and hyperuricemia. They agreed to hold her Allopurinol, which has significantly ameliorated her nausea, though not fully resolved. She continues to have sporadic abdominal pain that is unchanged. She has not discontinued NSAID use, as recommended. She does not want to pursue further evaluation at present, preferring to hold NSAIDs, continue Pepcid 20 mg BID, focus on weight loss and increasing activity. She has yet to try tart cherry extract for her arthralgias.  She completed hepatic fibroscan with finding of F2 fibrosis and severe steatosis. Transaminitis continues, albeit low level, though continuing to rise. All alcohol consumption has been curtailed, although this was  not a major issue. She has successfully lost 8 lbs thus far.   Imaging: Hepatic fibroscan 2018 - F2, severe steatosis  Assessment: NAFLD with moderate fibrosis. Transaminitis likely due to this, but possible contribution from ongoing chronic NSAID use. Abdominal pain likely due to NSAID gastropathy. Nausea  improved off Allopurinol. Obesity to be addressed with ongoing weight loss, calorie restriction, and increased physical activity. She will discuss sleep disturbance with her primary MD to pursue further evaluation if indicated.  Plan: Weight loss Stop NSAIDs Continue to hold Allopurinol as long as uric acid levels allow Tart cherry extract Lab: Hepatic profile in 3 months Consider EGD, abdominal CT if pain worsens. Consider evaluation for possible sleep disorder. Return in 3 months."           Weight:  Wt Readings from Last 3 Encounters:  12/15/17 190 lb 12.8 oz (86.5 kg)  12/09/17 188 lb (85.3 kg)  11/24/17 189 lb (85.7 kg)   BMI Readings from Last 3 Encounters:  12/15/17 30.80 kg/m  12/09/17 30.34 kg/m  11/24/17 30.51 kg/m   Lab Results  Component Value Date   HGBA1C 5.7 (H) 06/10/2017   HGBA1C 5.5 12/08/2016   HGBA1C 5.5 10/10/2015    Review of Systems: General:   No F/C, wt loss Pulm:   No DIB, SOB, pleuritic chest pain Card:  No CP, palpitations Abd:  No n/v/d or pain Ext:  No inc edema from baseline   Objective: Physical Exam: BP 130/83   Pulse 80   Ht 5\' 5"  (1.651 m)   Wt 192 lb 12.8 oz (87.5 kg)   BMI 32.08 kg/m  Body mass index is 32.08 kg/m. General: Well nourished, in no apparent distress. Eyes: PERRLA, EOMs, conjunctiva clr no swelling or erythema ENT/Mouth: Hearing appears normal.  Mucus Membranes Moist  Neck: Supple, no masses Resp: Respiratory effort- normal, ECTA B/L w/o W/R/R  Cardio: RRR w/o MRGs. Abdomen: no gross distention. Lymphatics:  Brisk peripheral pulses, less 2 sec cap RF, no gross edema  M-sk: Full ROM, 5/5 strength, normal gait.  Skin: Warm, dry without rashes, lesions, ecchymosis.  Neuro: Alert, Oriented Psych: Normal affect, Insight and Judgment appropriate.    Current Medications:  Current Outpatient Medications on File Prior to Visit  Medication Sig Dispense Refill  . hydrochlorothiazide (MICROZIDE) 12.5 MG  capsule Take 12.5 mg by mouth daily.     Marland Kitchen. levothyroxine (SYNTHROID, LEVOTHROID) 112 MCG tablet Take 112 mcg by mouth daily before breakfast.     No current facility-administered medications on file prior to visit.     Medical History:  Patient Active Problem List   Diagnosis Date Noted  . NAFLD (nonalcoholic fatty liver disease) 40/98/119107/25/2018    Priority: High  . Elevated LFTs 11/11/2015    Priority: High  . Adjustment disorder with mixed anxiety and depressed mood 11/11/2015    Priority: High  . Pseudogout involving multiple joints 10/14/2015    Priority: High  . HTN (hypertension) 10/14/2015    Priority: High  . Elevated HDL 11/11/2015    Priority: Medium  . Hypothyroidism 10/14/2015    Priority: Medium  . Generalized OA 10/14/2015    Priority: Medium  . 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 Hyperuricemia 04/11/2016    Priority: Low  . Primary insomnia 04/11/2016    Priority: Low  . Vitamin D deficiency 10/14/2015    Priority: Low  . Fatigue 10/14/2015    Priority: Low  . Sleep difficulties 10/14/2015  Priority: Low  . Paresthesia 12/09/2017  . Chronic insomnia 11/24/2017  . Insomnia secondary to chronic pain 11/24/2017  . Complaint related to dreams 11/24/2017  . 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 Neuropathy 08/18/2017  . 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 Obesity, Class I, BMI 30-34.9 12/03/2016  . Kidney stones 09/12/2016  . Nausea 09/12/2016  . High risk medication use 07/30/2016  . Primary osteoarthritis of both feet 04/12/2016  . History of kidney stones 04/12/2016  . Primary osteoarthritis of both hands 04/11/2016  . Bilateral primary osteoarthritis of hip 04/11/2016  . Primary osteoarthritis of both knees 04/11/2016  . Spondylosis of lumbar region without myelopathy or radiculopathy 04/11/2016  . Flank pain 04/02/2016  .  Sinusitis 04/02/2016  . Dysuria 11/11/2015  . Hormone replacement therapy- per GYN 10/14/2015  . h/o Hiatal hernia 10/14/2015  . Counseling on health promotion and disease prevention 10/14/2015  . Age-related nuclear cataract of both eyes 01/25/2015  . Posterior vitreous detachment of left eye 01/25/2015  . chronic Palpitations 07/19/2013    Allergies:  Allergies  Allergen Reactions  . Penicillins Hives and Itching    Has patient had a PCN reaction causing immediate rash, facial/tongue/throat swelling, SOB or lightheadedness with hypotension: No Has patient had a PCN reaction causing severe rash involving mucus membranes or skin necrosis: No Has patient had a PCN reaction that required hospitalization: No Has patient had a PCN reaction occurring within the last 10 years: No If all of the above answers are "NO", then may proceed with Cephalosporin use.   . Sulfa Antibiotics Hives and Itching    Bactrim     Family history-  Reviewed; changed as appropriate  Social history-  Reviewed; changed as appropriate

## 2016-12-04 ENCOUNTER — Other Ambulatory Visit: Payer: BLUE CROSS/BLUE SHIELD

## 2016-12-08 ENCOUNTER — Other Ambulatory Visit: Payer: BLUE CROSS/BLUE SHIELD

## 2016-12-08 DIAGNOSIS — R945 Abnormal results of liver function studies: Secondary | ICD-10-CM

## 2016-12-08 DIAGNOSIS — E559 Vitamin D deficiency, unspecified: Secondary | ICD-10-CM

## 2016-12-08 DIAGNOSIS — I10 Essential (primary) hypertension: Secondary | ICD-10-CM | POA: Diagnosis not present

## 2016-12-08 DIAGNOSIS — Z0000 Encounter for general adult medical examination without abnormal findings: Secondary | ICD-10-CM

## 2016-12-08 DIAGNOSIS — R7989 Other specified abnormal findings of blood chemistry: Secondary | ICD-10-CM

## 2016-12-08 DIAGNOSIS — E039 Hypothyroidism, unspecified: Secondary | ICD-10-CM | POA: Diagnosis not present

## 2016-12-08 DIAGNOSIS — E7889 Other lipoprotein metabolism disorders: Secondary | ICD-10-CM | POA: Diagnosis not present

## 2016-12-09 LAB — COMPREHENSIVE METABOLIC PANEL
ALBUMIN: 4.5 g/dL (ref 3.5–5.5)
ALT: 43 IU/L — ABNORMAL HIGH (ref 0–32)
AST: 35 IU/L (ref 0–40)
Albumin/Globulin Ratio: 2.1 (ref 1.2–2.2)
Alkaline Phosphatase: 77 IU/L (ref 39–117)
BUN / CREAT RATIO: 26 — AB (ref 9–23)
BUN: 13 mg/dL (ref 6–24)
Bilirubin Total: 0.4 mg/dL (ref 0.0–1.2)
CALCIUM: 9.5 mg/dL (ref 8.7–10.2)
CO2: 27 mmol/L (ref 20–29)
CREATININE: 0.5 mg/dL — AB (ref 0.57–1.00)
Chloride: 100 mmol/L (ref 96–106)
GFR calc non Af Amer: 106 mL/min/{1.73_m2} (ref >59)
GFR, EST AFRICAN AMERICAN: 123 mL/min/{1.73_m2} (ref >59)
GLUCOSE: 99 mg/dL (ref 65–99)
Globulin, Total: 2.1 g/dL (ref 1.5–4.5)
Potassium: 4.2 mmol/L (ref 3.5–5.2)
Sodium: 146 mmol/L — ABNORMAL HIGH (ref 134–144)
TOTAL PROTEIN: 6.6 g/dL (ref 6.0–8.5)

## 2016-12-09 LAB — CBC WITH DIFFERENTIAL/PLATELET
BASOS ABS: 0 10*3/uL (ref 0.0–0.2)
Basos: 1 %
EOS (ABSOLUTE): 0.1 10*3/uL (ref 0.0–0.4)
Eos: 2 %
HEMOGLOBIN: 14.9 g/dL (ref 11.1–15.9)
Hematocrit: 44 % (ref 34.0–46.6)
IMMATURE GRANS (ABS): 0 10*3/uL (ref 0.0–0.1)
Immature Granulocytes: 0 %
Lymphocytes Absolute: 2.4 10*3/uL (ref 0.7–3.1)
Lymphs: 41 %
MCH: 32.1 pg (ref 26.6–33.0)
MCHC: 33.9 g/dL (ref 31.5–35.7)
MCV: 95 fL (ref 79–97)
MONOCYTES: 7 %
Monocytes Absolute: 0.4 10*3/uL (ref 0.1–0.9)
NEUTROS ABS: 2.9 10*3/uL (ref 1.4–7.0)
Neutrophils: 49 %
Platelets: 248 10*3/uL (ref 150–379)
RBC: 4.64 x10E6/uL (ref 3.77–5.28)
RDW: 13.5 % (ref 12.3–15.4)
WBC: 5.9 10*3/uL (ref 3.4–10.8)

## 2016-12-09 LAB — LIPID PANEL
CHOLESTEROL TOTAL: 183 mg/dL (ref 100–199)
Chol/HDL Ratio: 2.2 ratio (ref 0.0–4.4)
HDL: 82 mg/dL (ref >39)
LDL Calculated: 86 mg/dL (ref 0–99)
Triglycerides: 74 mg/dL (ref 0–149)
VLDL CHOLESTEROL CAL: 15 mg/dL (ref 5–40)

## 2016-12-09 LAB — TSH: TSH: 0.409 u[IU]/mL — AB (ref 0.450–4.500)

## 2016-12-09 LAB — VITAMIN D 25 HYDROXY (VIT D DEFICIENCY, FRACTURES): Vit D, 25-Hydroxy: 43 ng/mL (ref 30.0–100.0)

## 2016-12-09 LAB — HEMOGLOBIN A1C
Est. average glucose Bld gHb Est-mCnc: 111 mg/dL
Hgb A1c MFr Bld: 5.5 % (ref 4.8–5.6)

## 2016-12-11 ENCOUNTER — Encounter: Payer: Self-pay | Admitting: Family Medicine

## 2017-01-06 NOTE — Progress Notes (Deleted)
Office Visit Note  Patient: Marissa Larson             Date of Birth: February 28, 1955           MRN: [id number]             PCP: Thomasene Lot, DO Referring: Thomasene Lot, DO Visit Date: 01/20/2017 Occupation: @GUAROCC @    Subjective:  No chief complaint on file.   History of Present Illness: Marissa Larson is a 62 y.o. female ***   Activities of Daily Living:  Patient reports morning stiffness for *** {minute/hour:19697}.   Patient {ACTIONS;DENIES/REPORTS:21021675::"Denies"} nocturnal pain.  Difficulty dressing/grooming: {ACTIONS;DENIES/REPORTS:21021675::"Denies"} Difficulty climbing stairs: {ACTIONS;DENIES/REPORTS:21021675::"Denies"} Difficulty getting out of chair: {ACTIONS;DENIES/REPORTS:21021675::"Denies"} Difficulty using hands for taps, buttons, cutlery, and/or writing: {ACTIONS;DENIES/REPORTS:21021675::"Denies"}   No Rheumatology ROS completed.   PMFS History:  Patient Active Problem List   Diagnosis Date Noted  . Obesity, Class I, BMI 30-34.9 12/03/2016  . Kidney stones 09/12/2016  . Nausea 09/12/2016  . High risk medication use 07/30/2016  . NAFLD (nonalcoholic fatty liver disease) 21/30/8657  . Primary osteoarthritis of both feet 04/12/2016  . History of kidney stones 04/12/2016  . Primary osteoarthritis of both hands 04/11/2016  . Bilateral primary osteoarthritis of hip 04/11/2016  . Primary osteoarthritis of both knees 04/11/2016  . Spondylosis of lumbar region without myelopathy or radiculopathy 04/11/2016  . Hyperuricemia 04/11/2016  . Primary insomnia 04/11/2016  . Flank pain 04/02/2016  . Sinusitis 04/02/2016  . Elevated HDL 11/11/2015  . Elevated LFTs 11/11/2015  . Adjustment disorder with mixed anxiety and depressed mood 11/11/2015  . Dysuria 11/11/2015  . Hypothyroidism 10/14/2015  . Pseudogout involving multiple joints 10/14/2015  . Vitamin D deficiency 10/14/2015  . Hormone replacement therapy- per GYN 10/14/2015  . HTN (hypertension)  10/14/2015  . Fatigue 10/14/2015  . h/o Hiatal hernia 10/14/2015  . Sleep difficulties 10/14/2015  . Generalized OA 10/14/2015  . Counseling on health promotion and disease prevention 10/14/2015  . 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 Age-related nuclear cataract of both eyes 01/25/2015  . Posterior vitreous detachment of left eye 01/25/2015  . chronic Palpitations 07/19/2013    Past Medical History:  Diagnosis Date  . Arthritis   . Chest pain 06/2013   Left sided  . Chronic leg pain 06/2013  . Fatigue   . Nonproductive cough 06/2013  . Overweight 01/18/2014  . Sleep difficulties    Change in sleep patterns  . SOB (shortness of breath) 06/2013  . Tachycardia 06/2013  . Thyroid disease    hypo  . UTI (lower urinary tract infection)     Family History  Problem Relation Age of Onset  . Migraines Other        Fam Hx   . Heart disease Other        Fam Hx  . Thyroid disease Other        Fam Hx - She also has thyroid disease  . Diabetes Other        Fam Hx  . Arthritis Other        Fam Hx  . Gallbladder disease Other        Fam Hx  . Cancer Other        Fam Hx - Pancreatic  . Hypertension Other        Fam Hx  . Heart attack Cousin        Multiple cousins with MI's before 67 years of age  . Heart disease Mother 13  .  Hyperlipidemia Mother 4340  . Hypertension Mother 4540  . Stroke Mother 1165  . Heart disease Maternal Grandmother   . Cancer Father 7358       pancreatic  . Diabetes Brother 50  . Heart attack Maternal Uncle   . Heart attack Paternal Aunt    Past Surgical History:  Procedure Laterality Date  . ABDOMINAL SURGERY  1994  . FOOT SURGERY Right 2009  . KIDNEY SURGERY Left 1993  . KNEE SURGERY     Multiple knee surgeries  . OOPHORECTOMY Left 1994  . URETEROSCOPY VIA URETEROSTOMY Left 1994   Social History   Social History Narrative  . Not on file     Objective: Vital Signs: There were no vitals taken for this visit.   Physical Exam    Musculoskeletal Exam: ***  CDAI Exam: No CDAI exam completed.    Investigation: No additional findings.Uric acid: 11/11/2016 5.6 CBC Latest Ref Rng & Units 12/08/2016 11/11/2016 07/18/2016  WBC 3.4 - 10.8 x10E3/uL 5.9 9.5 7.6  Hemoglobin 11.1 - 15.9 g/dL 16.114.9 09.614.9 04.515.5  Hematocrit 34.0 - 46.6 % 44.0 42.7 45.3(H)  Platelets 150 - 379 x10E3/uL 248 240 263   CMP Latest Ref Rng & Units 12/08/2016 11/11/2016 07/18/2016  Glucose 65 - 99 mg/dL 99 409(W142(H) 88  BUN 6 - 24 mg/dL 13 15 11   Creatinine 0.57 - 1.00 mg/dL 1.19(J0.50(L) 4.780.59 2.950.63  Sodium 134 - 144 mmol/L 146(H) 139 140  Potassium 3.5 - 5.2 mmol/L 4.2 3.6 3.7  Chloride 96 - 106 mmol/L 100 100 101  CO2 20 - 29 mmol/L 27 28 30   Calcium 8.7 - 10.2 mg/dL 9.5 9.7 9.4  Total Protein 6.0 - 8.5 g/dL 6.6 6.7 6.9  Total Bilirubin 0.0 - 1.2 mg/dL 0.4 0.5 0.4  Alkaline Phos 39 - 117 IU/L 77 - 70  AST 0 - 40 IU/L 35 69(H) 59(H)  ALT 0 - 32 IU/L 43(H) 77(H) 67(H)    Imaging: No results found.  Speciality Comments: No specialty comments available.    Procedures:  No procedures performed Allergies: Advil [ibuprofen]; Penicillins; and Sulfa antibiotics   Assessment / Plan:     Visit Diagnoses: No diagnosis found.    Orders: No orders of the defined types were placed in this encounter.  No orders of the defined types were placed in this encounter.   Face-to-face time spent with patient was *** minutes. 50% of time was spent in counseling and coordination of care.  Follow-Up Instructions: No Follow-up on file.   Ellen HenriMarissa C Kelty Szafran, CMA  Note - This record has been created using Animal nutritionistDragon software.  Chart creation errors have been sought, but may not always  have been located. Such creation errors do not reflect on  the standard of medical care.

## 2017-01-20 ENCOUNTER — Ambulatory Visit: Payer: BLUE CROSS/BLUE SHIELD | Admitting: Rheumatology

## 2017-01-21 ENCOUNTER — Encounter: Payer: Self-pay | Admitting: Family Medicine

## 2017-01-21 ENCOUNTER — Ambulatory Visit (INDEPENDENT_AMBULATORY_CARE_PROVIDER_SITE_OTHER): Payer: BLUE CROSS/BLUE SHIELD | Admitting: Family Medicine

## 2017-01-21 VITALS — BP 142/89 | HR 90 | Temp 98.0°F | Ht 65.0 in | Wt 184.8 lb

## 2017-01-21 DIAGNOSIS — R059 Cough, unspecified: Secondary | ICD-10-CM

## 2017-01-21 DIAGNOSIS — M940 Chondrocostal junction syndrome [Tietze]: Secondary | ICD-10-CM

## 2017-01-21 DIAGNOSIS — B349 Viral infection, unspecified: Secondary | ICD-10-CM

## 2017-01-21 DIAGNOSIS — R05 Cough: Secondary | ICD-10-CM | POA: Diagnosis not present

## 2017-01-21 MED ORDER — ACETAMINOPHEN 325 MG PO TABS: 650.0000 mg | ORAL_TABLET | Freq: Four times a day (QID) | ORAL | Status: DC | PRN

## 2017-01-21 MED ORDER — HYDROCOD POLST-CPM POLST ER 10-8 MG/5ML PO SUER: 5.0000 mL | Freq: Two times a day (BID) | ORAL | 0 refills | Status: DC | PRN

## 2017-01-21 NOTE — Patient Instructions (Signed)
You most likely have a viral infection that should resolve on its own over time (or this could be a flare of seasonal allergies as well).   Symptoms for a viral upper respiratory tract infection usually last 3-7 days but can stretch out to 2-3 weeks before you're feeling back to normal.  Your symptoms should not worsen after 7-10 days and if they truely do, please notify our office, as you may need antibiotics.  You can use over-the-counter afrin nasal spray for up to 3 days (NO longer than that) which will help acutely with nasal drainage/ congestion short term.   Also, sterile saline nasal rinses, such as Neil med or AYR sinus rinses, can be very helpful and should be done twice daily- especially throughout the allergy season.   Remember you should use distilled water or previously boiled water to do this.   You can also use an over the counter cold and flu medication such as Tylenol Severe Cold and Sinus/Flu or Dayquil, Nyquil and the like, which will help with cough, congestion, headache/ pain, fevers/chills etc.  Please note, if you being treated for hypertension or have high blood pressure, you should be using the cold meds designated "HBP".    Unfortunately, antibiotics are not helpful for viral infections.  Wash your hands frequently, as you did not want to get those around you sick as well. Never sneeze or cough on others.  And you should not be going to school or work if you are running a temperature of 100.5 or more on two separate occasions.   Drink plenty of fluids and stay hydrated, especially if you are running fevers.  We don't know why, but chicken soup also helps, try it! :)     Upper respiratory viral infection, Adult Adenoviruses are common viruses that cause many different types of infections. The viruses usually affect the lungs, but they can also affect other parts of the body, including the eyes, stomach, bowels, bladder, and brain. The most common type of adenovirus  infection is the common cold. Usually, adenovirus infections are not severe unless you have another health problem that makes it hard for your body to fight off infection. What are the causes? You can get this condition if you:  Touch a surface or object that has an adenovirus on it and then touch your mouth, nose, or eyes with unwashed hands.  Come into close physical contact with an infected person, such as by hugging or shaking hands.  Breathe in droplets that fly through the air when an infected person talks, coughs, or sneezes.  Have contact with infected stool.  Swim in a pool that does not have enough chlorine.  Adenoviruses can live outside the body for many weeks. They spread easily from person to person (are contagious). What increases the risk? This condition is more likely to develop in:  People who spend a lot of time in places where there are many people, such as schools, summer camps, daycare centers, community centers, and military recruit training centers.  Elderly adults.  People with a weak body defense system (immune system).  People with a lung disease.  People with a heart condition.  What are the signs or symptoms? Adenovirus infections usually cause flu-like symptoms. Once the virus gets into the body, symptoms of this condition can take up to 14 days to develop. Symptoms may include:  Headache.  Stiff neck.  Sleepiness or fatigue.  Confusion or disorientation.  Fever.  Sore throat.  Cough.    Trouble breathing.  Runny nose or congestion.  Pink eye (conjunctivitis).  Bleeding into the covering of the eye.  Stomachache or diarrhea.  Nausea or vomiting.  Blood in the urine or pain while urinating.  Ear pain or fullness.  How is this diagnosed? This condition may be diagnosed based on your symptoms and a physical exam. Your health care provider may order tests to make sure your symptoms are not caused by another type of problem. Tests  can include:  Blood tests.  Urine tests.  Stool tests.  Chest X-ray.  Tissue or throat culture.  How is this treated? This condition goes away on its own with time. Treatment for this condition involves managing symptoms until the condition goes away. Your health care provider may recommend:  Rest.  Drinking more fluids.  Taking over-the-counter medicine to help relieve a sore throat, fever, or headache.  Follow these instructions at home:  Rest at home until your symptoms go away.  Drink enough fluid to keep your urine clear or pale yellow.  Take over-the-counter and prescription medicines only as told by your health care provider.  Keep all follow-up visits as told by your health care provider. This is important. How is this prevented? Adenoviruses are resistant to many cleaning products and can remain on surfaces for long periods of time. To help prevent infection:  Wash your hands often with soap and water.  Cover your nose or mouth when you sneeze or cough.  Do not touch your eyes, nose, or mouth with unwashed hands.  Clean commonly used objects often.  Do not swim in a pool that is not properly chlorinated.  Avoid close contact with people who are sick.  Do not go to school or work when you are sick.  Contact a health care provider if:  Your symptoms do not improve after 10 days.  Your symptoms get worse.  You cannot eat or drink without vomiting. Get help right away if:  You have trouble breathing or you are breathing rapidly.  Your skin, lips, or fingernails look blue (cyanosis).  You have a rapid heart rate.  You become confused.  You lose consciousness. This information is not intended to replace advice given to you by your health care provider. Make sure you discuss any questions you have with your health care provider. Document Released: 03/15/2002 Document Revised: 08/20/2015 Document Reviewed: 08/20/2015 Elsevier Interactive Patient  Education  2018 [employer]   

## 2017-01-21 NOTE — Progress Notes (Signed)
Acute Care Office visit  Assessment and plan:  1. Cough   2. Costochondritis, acute   3. Viral illness- likley influenza       1. Cough-  Take tussionex as prescribed below.  2. Costochondritis-  3. Viral infection- Pt instructed to take this before bed as this medication can make you sleepy. Take tylenol as needed for pain. Drink fluids and get plenty of rest. Eat healthy, including warm liquids. Highly recommended to not leave your house and go to work if running a fever. Pt must be fever free for 24 hours before returning to work. Neti pot and sinus rinses recommended for additional decongestant relief. Recommended to pt steroids as a possible symptom treatment to which pt declined.   Call the office if symptoms do not improve and we will prescribe Abx at that time.   - Viral vs Allergic vs Bacterial causes for pt's symptoms reveiwed.    - Supportive care and various OTC medications discussed in addition to any prescribed. - Call or RTC if new symptoms, or if no improvement or worse over next several days.   - Will consider ABX if sx continue past 10 days and worsening.    Meds ordered this encounter  Medications  . chlorpheniramine-HYDROcodone (TUSSIONEX) 10-8 MG/5ML SUER    Sig: Take 5 mLs by mouth every 12 (twelve) hours as needed for cough (cough, will cause drowsiness.).    Dispense:  200 mL    Refill:  0  . acetaminophen (TYLENOL) 325 MG tablet    Sig: Take 2 tablets (650 mg total) by mouth every 6 (six) hours as needed for moderate pain, fever or headache (costochondritis pain).     Gross side effects, risk and benefits, and alternatives of medications discussed with patient.  Patient is aware that all medications have potential side effects and we are unable to predict every sideeffect or drug-drug interaction that may occur.  Expresses verbal understanding and consents to current therapy plan and treatment regiment.   Education and routine counseling performed.  Handouts provided.  Anticipatory guidance and routine counseling done re: condition, txmnt options and need for follow up. All questions of patient's were answered.  Return if symptoms worsen or fail to improve.  Please see AVS handed out to patient at the end of our visit for additional patient instructions/ counseling done pertaining to today's office visit.  Note: This document was partially repared using Dragon voice recognition software and may include unintentional dictation errors.   This document serves as a record of services personally performed by Marissa Loteborah Alexia Dinger, DO. It was created on her behalf by Thelma BargeNick Cochran, a trained medical scribe. The creation of this record is based on the scribe's personal observations and the provider's statements to them.   I have reviewed the above medical documentation for accuracy and completeness and I concur.  Marissa Larson 01/21/17 6:44 PM   Subjective:    Chief Complaint  Patient presents with  . Cough    non productive cough and chest congestion x 3 days     HPI:  Pt presents with Sx for 4 days   C/o: Dry cough with associated rhinorrhea, chest feeling "on fire" and painful when she coughs, and decreased energy. She had associated mild wheezing but this improved, and subjective fever and chills, but this subsided 1 day ago. She states her husband has had a recent "head cold" for 10 days. She also states she had difficulty getting out of bed today.  Denies: SOB and chest tightness.    For symptoms patient has tried:  Advil, daytime/nighttime cough suppressants  Overall getting:   Mildly better.   Patient Care Team    Relationship Specialty Notifications Start End  Marissa Lot, DO PCP - General Family Medicine  10/09/15   Marinus Maw, MD Consulting Physician Cardiology  10/09/15   Salvatore Marvel, MD Consulting Physician Orthopedic Surgery  10/09/15   Pollyann Savoy, MD Consulting Physician Rheumatology  10/09/15     Jethro Bolus, MD Consulting Physician Urology  10/09/15   [name], NP Nurse Practitioner Obstetrics and Gynecology  10/09/15   Altheimer, Casimiro Needle, MD Consulting Physician Endocrinology  10/09/15   Sharrell Ku, MD Consulting Physician Gastroenterology  10/09/15     Past medical history, Surgical history, Family history reviewed and noted below, Social history, Allergies, and Medications have been entered into the medical record, reviewed and changed as needed.   Allergies  Allergen Reactions  . Advil [Ibuprofen] Nausea Only    History of gastric ulcer with hemorrhage secondary to ibuprofen use  . Penicillins Hives and Itching  . Sulfa Antibiotics Hives and Itching    Bactrim    Review of Systems: - see above HPI for pertinent positives General:  No wt loss Pulm:   No DIB, pleuritic chest pain Card:  No CP, palpitations Abd:  No n/v/d or pain Ext:  No inc edema from baseline   Objective:   Blood pressure (!) 142/89, pulse 90, temperature 98 F (36.7 C), height 5\' 5"  (1.651 m), weight 184 lb 12.8 oz (83.8 kg), SpO2 97 %. Body mass index is 30.75 kg/m. General: Well Developed, well nourished, appropriate for stated age.  Neuro: Alert and oriented x3, extra-ocular muscles intact, sensation grossly intact.  HEENT: Normocephalic, atraumatic, pupils equal round reactive to light, neck supple, no masses, no painful lymphadenopathy, TM's intact B/L, no acute findings. Nares- patent, clear d/c, OP- clear, mild erythema, No TTP sinuses Skin: Warm and dry, no gross rash. Cardiac: RRR, S1 S2,  no murmurs rubs or gallops.  Respiratory: ECTA B/L and A/P, Not using accessory muscles, speaking in full sentences- unlabored. Vascular:  No gross lower ext edema, cap RF less 2 sec. Psych: No HI/SI, judgement and insight good, Euthymic mood. Full Affect. Musculoskeletal: TTP along the costo-margins, especially along inferior aspect of manubrium.

## 2017-01-23 ENCOUNTER — Telehealth: Payer: Self-pay | Admitting: Family Medicine

## 2017-01-23 NOTE — Telephone Encounter (Signed)
Patient called back as advised by provider if not feeling better in 2 days--Patient states now has head & chest congestion along w/ continued cough--   ---Patient request provider call in a (antibiotic) to pharmacy if possible before the weekend comes.   Patient uses:    Pharmacy:  [facility] [place] - [id number]  --glh

## 2017-01-23 NOTE — Telephone Encounter (Signed)
Notified patient that Dr. Sharee Holsterpalski is out of the office until Monday.  Patient denies any fevers.  States that she is having increased congestion.  Advised patient to continue OTC Mucinex and to use hummidifier or steam from hot shower.  If temp reaches 101 or higher to go to an urgent care and to follow up with our office by phone on Monday if no improvement. MPulliam, CMA/RT(R)

## 2017-02-09 ENCOUNTER — Telehealth: Payer: Self-pay | Admitting: Family Medicine

## 2017-02-09 DIAGNOSIS — M25571 Pain in right ankle and joints of right foot: Secondary | ICD-10-CM | POA: Diagnosis not present

## 2017-02-09 NOTE — Telephone Encounter (Signed)
Pt would like to get a referral to a vascular doctor. She stated that she seen her ortho doctor and they think she needs to see a vasular doctor for her foot and just needs a referral put in. Thanks

## 2017-02-10 DIAGNOSIS — M79604 Pain in right leg: Secondary | ICD-10-CM | POA: Diagnosis not present

## 2017-02-10 DIAGNOSIS — M79605 Pain in left leg: Secondary | ICD-10-CM | POA: Diagnosis not present

## 2017-02-10 NOTE — Telephone Encounter (Signed)
Please let patient know it would be best to see her for this and evaluate her so I can better understand why I am sending her to vascular.  Also, if or so knows why they want to send her to vascular, they certainly can put that in without her having to come in here if there is something they saw concerning on her physical exam.

## 2017-02-10 NOTE — Telephone Encounter (Signed)
Pt informed.  Pt expressed understanding and is agreeable.  Pt transferred to front desk to schedule OV.  T. Merrel Crabbe, CMA  

## 2017-02-12 ENCOUNTER — Ambulatory Visit: Payer: BLUE CROSS/BLUE SHIELD | Admitting: Family Medicine

## 2017-02-12 ENCOUNTER — Other Ambulatory Visit: Payer: Self-pay

## 2017-02-12 DIAGNOSIS — I75029 Atheroembolism of unspecified lower extremity: Secondary | ICD-10-CM

## 2017-02-13 ENCOUNTER — Ambulatory Visit (HOSPITAL_COMMUNITY)
Admission: RE | Admit: 2017-02-13 | Discharge: 2017-02-13 | Disposition: A | Discharge status: Home / Self Care | Payer: BLUE CROSS/BLUE SHIELD | Source: Ambulatory Visit | Attending: Vascular Surgery | Admitting: Vascular Surgery

## 2017-02-13 ENCOUNTER — Encounter: Payer: Self-pay | Admitting: Vascular Surgery

## 2017-02-13 ENCOUNTER — Ambulatory Visit (INDEPENDENT_AMBULATORY_CARE_PROVIDER_SITE_OTHER): Payer: BLUE CROSS/BLUE SHIELD | Admitting: Vascular Surgery

## 2017-02-13 ENCOUNTER — Other Ambulatory Visit: Payer: Self-pay

## 2017-02-13 VITALS — BP 139/80 | HR 76 | Temp 98.2°F | Resp 16 | Ht 66.0 in | Wt 181.0 lb

## 2017-02-13 DIAGNOSIS — R230 Cyanosis: Secondary | ICD-10-CM

## 2017-02-13 DIAGNOSIS — I75029 Atheroembolism of unspecified lower extremity: Secondary | ICD-10-CM | POA: Insufficient documentation

## 2017-02-13 NOTE — Progress Notes (Signed)
Vitals:   02/13/17 0849  BP: (!) 167/95  Pulse: 73  Resp: 16  Temp: 98.2 F (36.8 C)  TempSrc: Oral  SpO2: 96%  Weight: 181 lb (82.1 kg)  Height: 5\' 6"  (1.676 m)

## 2017-02-13 NOTE — Progress Notes (Signed)
Office Visit Note  Patient: Marissa Larson             Date of Birth: [birth year]           MRN: [id number]             PCP: Thomasene Lot, DO Referring: Thomasene Lot, DO Visit Date: 02/16/2017 Occupation: @GUAROCC @    Subjective:  Right toe discoloration and pain    History of Present Illness: Marissa Larson is a 62 y.o. female with history of pseudogout and osteoarthritis of hands, hips, knees, and feet.  Patient states that she has been off of Allopurinol 100 mg daily since October due to elevation in liver enzymes.  She was diagnosed with NAFLD by Dr. Kinnie Scales and is following up in 2 weeks.  She denies any recent kidney stones since discontinuing allopurinol.  She denies any pseudogout flares.  She continues to take Colchicine 0.6 mg BID. She continues to have pain in her hands, hips, knees, and feet.  She states that her right hip is worse than the left hip.  Her lower back also causes discomfort daily and wakes her up at night with positional changes.   She states that her main concern is pain in all of the toes of her right foot and left 2nd toe.  She states that she thought the pain was related to pseudogout, but the pain has been worsening and her toes have been discolored.  She describes the pain as a burning sensation and a throbbing pain.  She states that she was seen by Dr. Thurston Hole (orthopedist) and by Dr. Gilmer Mor (vascular).  She had a ABI performed last week, which was normal per patient.  She states that Dr. Gilmer Mor ordered a abdominal CT scan, which she is going to have done in 3-4 weeks.      Activities of Daily Living:  Patient reports morning stiffness for 30  minutes.   Patient Reports nocturnal pain.  Difficulty dressing/grooming: Denies Difficulty climbing stairs: Reports Difficulty getting out of chair: Reports Difficulty using hands for taps, buttons, cutlery, and/or writing: Reports   Review of Systems  Constitutional: Positive for fatigue and weight loss.  Negative for weakness.  HENT: Negative for mouth sores, mouth dryness and nose dryness.   Eyes: Negative for redness, visual disturbance and dryness.  Respiratory: Negative for cough, hemoptysis, shortness of breath and difficulty breathing.   Cardiovascular: Positive for hypertension. Negative for chest pain, palpitations, irregular heartbeat and swelling in legs/feet.  Gastrointestinal: Negative for blood in stool, constipation and diarrhea.  Endocrine: Negative for increased urination.  Genitourinary: Negative for painful urination.  Musculoskeletal: Positive for arthralgias, joint pain, joint swelling, muscle weakness and morning stiffness. Negative for myalgias, muscle tenderness and myalgias.  Skin: Positive for color change and redness (Toes). Negative for pallor, rash, hair loss, nodules/bumps, skin tightness, ulcers and sensitivity to sunlight.  Allergic/Immunologic: Negative for susceptible to infections.  Neurological: Positive for numbness and headaches. Negative for dizziness.  Hematological: Negative for swollen glands.  Psychiatric/Behavioral: Positive for sleep disturbance (Lunesta). Negative for depressed mood. The patient is nervous/anxious (Lexapro).     PMFS History:  Patient Active Problem List   Diagnosis Date Noted  . Obesity, Class I, BMI 30-34.9 12/03/2016  . Kidney stones 09/12/2016  . Nausea 09/12/2016  . High risk medication use 07/30/2016  . NAFLD (nonalcoholic fatty liver disease) 40/98/1191  . Primary osteoarthritis of both feet 04/12/2016  . History of kidney stones 04/12/2016  . Primary osteoarthritis of both  hands 04/11/2016  . Bilateral primary osteoarthritis of hip 04/11/2016  . Primary osteoarthritis of both knees 04/11/2016  . Spondylosis of lumbar region without myelopathy or radiculopathy 04/11/2016  . Hyperuricemia 04/11/2016  . Primary insomnia 04/11/2016  . Flank pain 04/02/2016  . Sinusitis 04/02/2016  . Elevated HDL 11/11/2015  .  Elevated LFTs 11/11/2015  . Adjustment disorder with mixed anxiety and depressed mood 11/11/2015  . Dysuria 11/11/2015  . Hypothyroidism 10/14/2015  . Pseudogout involving multiple joints 10/14/2015  . Vitamin D deficiency 10/14/2015  . Hormone replacement therapy- per GYN 10/14/2015  . HTN (hypertension) 10/14/2015  . Fatigue 10/14/2015  . h/o Hiatal hernia 10/14/2015  . Sleep difficulties 10/14/2015  . Generalized OA 10/14/2015  . Counseling on health promotion and disease prevention 10/14/2015  . 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 Age-related nuclear cataract of both eyes 01/25/2015  . Posterior vitreous detachment of left eye 01/25/2015  . chronic Palpitations 07/19/2013    Past Medical History:  Diagnosis Date  . Arthritis   . Chest pain 06/2013   Left sided  . Chronic leg pain 06/2013  . Fatigue   . Nonproductive cough 06/2013  . Overweight 01/18/2014  . Sleep difficulties    Change in sleep patterns  . SOB (shortness of breath) 06/2013  . Tachycardia 06/2013  . Thyroid disease    hypo  . UTI (lower urinary tract infection)     Family History  Problem Relation Age of Onset  . Migraines Other        Fam Hx   . Heart disease Other        Fam Hx  . Thyroid disease Other        Fam Hx - She also has thyroid disease  . Diabetes Other        Fam Hx  . Arthritis Other        Fam Hx  . Gallbladder disease Other        Fam Hx  . Cancer Other        Fam Hx - Pancreatic  . Hypertension Other        Fam Hx  . Heart attack Cousin        Multiple cousins with MI's before 48 years of age  . Heart disease Mother 75  . Hyperlipidemia Mother 52  . Hypertension Mother 65  . Stroke Mother 82  . Heart disease Maternal Grandmother   . Cancer Father 29       pancreatic  . Diabetes Brother 50  . Heart attack Maternal Uncle   . Heart attack Paternal Aunt    Past Surgical History:  Procedure Laterality Date  . ABDOMINAL SURGERY  1994  . FOOT SURGERY Right 2009  .  KIDNEY SURGERY Left 1993  . KNEE SURGERY     Multiple knee surgeries  . OOPHORECTOMY Left 1994  . URETEROSCOPY VIA URETEROSTOMY Left 1994   Social History   Social History Narrative  . Not on file     Objective: Vital Signs: BP (!) 157/81 (BP Location: Left Arm, Patient Position: Sitting, Cuff Size: Normal)   Pulse 77   Resp 17   Ht 5\' 6"  (1.676 m)   Wt 185 lb (83.9 kg)   BMI 29.86 kg/m    Physical Exam  Constitutional: She is oriented to person, place, and time. She appears well-developed and well-nourished.  HENT:  Head: Normocephalic and atraumatic.  Eyes: Conjunctivae and EOM are normal.  Neck: Normal range  of motion.  Cardiovascular: Normal rate, regular rhythm, normal heart sounds and intact distal pulses.  Pulmonary/Chest: Effort normal and breath sounds normal.  Abdominal: Soft. Bowel sounds are normal.  Lymphadenopathy:    She has no cervical adenopathy.  Neurological: She is alert and oriented to person, place, and time.  Skin: Skin is warm and dry. Capillary refill takes less than 2 seconds.  Discoloration of all toes on right foot. No digital ulcers present.  Cool to the touch.  Petechiae on distal aspect of several toes. Palpable dorsalis pedis pulse.    Psychiatric: She has a normal mood and affect. Her behavior is normal.  Nursing note and vitals reviewed.    Musculoskeletal Exam: C-spine, thoracic, and lumbar spine good ROM.  Shoulder joints, elbow joints, wrist joints, MCPs, PIPs, and DIPs good ROM with no synovitis.  She has DIP synovial thickening consistent with osteoarthritis.  Hip joints very limited external and internal ROM with discomfort.  No tenderness of trochanteric bursa.  Knee joints, ankle joints, MTPs good ROM.   No warmth or effusion of knees.  Knee crepitus bilaterally.  She has no tenderness of MTPs.  She has mild osteoarthritis changes in her bilateral feet.  She has petechiae present distally on her right great toe.  She has discoloration  of several toes.  No digital ulcers present.  She has decreased sensation on the plantar aspect of all right toes.    CDAI Exam: No CDAI exam completed.    Investigation: No additional findings. CBC Latest Ref Rng & Units 12/08/2016 11/11/2016 07/18/2016  WBC 3.4 - 10.8 x10E3/uL 5.9 9.5 7.6  Hemoglobin 11.1 - 15.9 g/dL 16.1 09.6 04.5  Hematocrit 34.0 - 46.6 % 44.0 42.7 45.3(H)  Platelets 150 - 379 x10E3/uL 248 240 263   CMP Latest Ref Rng & Units 12/08/2016 11/11/2016 07/18/2016  Glucose 65 - 99 mg/dL 99 409(W) 88  BUN 6 - 24 mg/dL 13 15 11   Creatinine 0.57 - 1.00 mg/dL 1.19(J) 4.78 2.95  Sodium 134 - 144 mmol/L 146(H) 139 140  Potassium 3.5 - 5.2 mmol/L 4.2 3.6 3.7  Chloride 96 - 106 mmol/L 100 100 101  CO2 20 - 29 mmol/L 27 28 30   Calcium 8.7 - 10.2 mg/dL 9.5 9.7 9.4  Total Protein 6.0 - 8.5 g/dL 6.6 6.7 6.9  Total Bilirubin 0.0 - 1.2 mg/dL 0.4 0.5 0.4  Alkaline Phos 39 - 117 IU/L 77 - 70  AST 0 - 40 IU/L 35 69(H) 59(H)  ALT 0 - 32 IU/L 43(H) 77(H) 67(H)    Imaging: Vas [place] Abi With/wo Tbi  Result Date: 02/13/2017 LOWER EXTREMITY DOPPLER STUDY Indications: Blue toe. Examination Guidelines: A complete evaluation includes B-mode imaging, spectral doppler, color doppler, and power doppler as needed of all accessible portions of each vessel. Bilateral testing is considered an integral part of a complete examination. Limited examinations for reoccurring indications may be performed as noted.  ABI Findings: +---------+------------------+-----+---------+--------+ Right    Rt Pressure (mmHg)IndexWaveform Comment  +---------+------------------+-----+---------+--------+ Brachial 171                                      +---------+------------------+-----+---------+--------+ ATA      189               1.11 triphasic         +---------+------------------+-----+---------+--------+ PTA      194  1.13 triphasic          +---------+------------------+-----+---------+--------+ Great Toe138               0.81                   +---------+------------------+-----+---------+--------+ +---------+------------------+-----+---------+-------+ Left     Lt Pressure (mmHg)IndexWaveform Comment +---------+------------------+-----+---------+-------+ Brachial 170                                     +---------+------------------+-----+---------+-------+ ATA      200               1.17 triphasic        +---------+------------------+-----+---------+-------+ PTA      200               1.17 triphasic        +---------+------------------+-----+---------+-------+ Great Toe152               0.89                  +---------+------------------+-----+---------+-------+ +-------+-----------+-----------+------------+------------+ ABI/TBIToday's ABIToday's TBIPrevious ABIPrevious TBI +-------+-----------+-----------+------------+------------+ Right  1.13       0.81       NA          NA           +-------+-----------+-----------+------------+------------+ Left   0.17       0.89       NA          NA           +-------+-----------+-----------+------------+------------+  Final Interpretation: Right: Resting right ankle-brachial index is within normal range. No evidence of significant right lower extremity arterial disease. RT great toe pressure = 138 mmHg. Left: Resting left ankle-brachial index is within normal range. No evidence of significant left lower extremity arterial disease. LT Great toe pressure = 152 mmHg.  *See table(s) above for measurements and observations.  Electronically signed by Lemar Livings on 02/13/2017 at 8:53:43 AM.  Final    Speciality Comments: No specialty comments available.    Procedures:  No procedures performed Allergies: Advil [ibuprofen]; Penicillins; and Sulfa antibiotics   Assessment / Plan:     Visit Diagnoses: Pseudogout involving multiple joints - No recent flares.   She takes Colchicine 0.6 mg BID.  She discontinued allopurinol in October due to elevation of LFTs.  She will continue on current treatment regimen.    Hyperuricemia -Discontinued Allopurinol 100 mg daily due to elevation of LFTs. Uric acid 5.6 on 11/11/16.    Primary osteoarthritis of both hands: She has DIP synovial thickening consistent with osteoarthritis.  Joint protection and muscle strengthening were discussed.   Bilateral primary osteoarthritis of hip: She has very limited internal and external ROM on exam with discomfort.    Primary osteoarthritis of both knees: She has no warmth or effusion.  She has crepitus of bilateral knees.    Primary osteoarthritis of both feet - s/p right foot reconstruction: She continues to have chronic pain in her feet and osteoarthritis changes.   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    NAFLD (nonalcoholic fatty liver disease): She is followed by Dr. Kinnie Scales.  She was started on Pepcid and discontinued Allopurinol.  She will be following up with him in 2 weeks and having lab work done in 1 week to monitor her LFTs.   Elevated LFTs: CMP was ordered today.  She will  follow up with Dr. Kinnie ScalesMedoff in 2 weeks.   Discoloration of skin of all toes on right foot -  She states her symptoms started about 6 months ago and have been intermittent but worsening. No exacerbating factors noted. No digital ulcers present.  She has decreased sensation and discoloration of all of her toes of the right foot.  Her toes are cool to palpation but she has palpable pulses and capillary refill is <2 seconds. She has recently been seen by Orthopedics who recommended she see vascular.  Dr. Randie Heinzain performed an ankle brachial index 1 week ago, which was normal per patient.  Per patient, Dr. Randie Heinzain will be ordering a chest abdominal and pelvis CT in the next 3 weeks to rule out proximal lesions.  She has not yet seen neurology.  A referral was made to  dermatology after discussing findings with Dr. Corliss Skainseveshwar who would like her to have a biopsy to rule out vasculitis.  Lab work was obtained to assess for vasculitis.  Plan: Pan-ANCA, Fluorescent treponemal ab(fta)-IgG-bld, Cryoglobulin, Sedimentation rate, COMPLETE METABOLIC PANEL WITH GFR, Urinalysis, Routine w reflex microscopic, ANA, Beta-2 glycoprotein antibodies, Cardiolipin antibodies, IgG, IgM, IgA  Screening examination for infectious disease - Routine lab screening tests were ordered and obtained today.  If she needs to be started on an immunosuppressant or long term prednisone it will be importance to have these lab tests performed. Plan: CBC with Differential/Platelet, QuantiFERON-TB Gold Plus, Serum protein electrophoresis with reflex, IgG, IgA, IgM, Thiopurine methyltransferase(tpmt)rbc, Hepatitis panel, acute   Other fatigue: Chronic.  Her fatigue has been improving due to her increased level of activity and recent intentional weight loss.    History of kidney stones - She was previously on Allopurinol. She denies any recent kidney stones.   Primary insomnia: Chronic.  She is on Lunesta, which helps.  Good sleep hygiene discussed.   Adjustment disorder with mixed anxiety and depressed mood: She is on Lexapro.   h/o Hiatal hernia  Essential hypertension  Vitamin D deficiency    Orders: Orders Placed This Encounter  Procedures  . Pan-ANCA  . Fluorescent treponemal ab(fta)-IgG-bld  . Cryoglobulin  . Sedimentation rate  . CBC with Differential/Platelet  . COMPLETE METABOLIC PANEL WITH GFR  . Urinalysis, Routine w reflex microscopic  . ANA  . Beta-2 glycoprotein antibodies  . Cardiolipin antibodies, IgG, IgM, IgA  . QuantiFERON-TB Gold Plus  . Serum protein electrophoresis with reflex  . IgG, IgA, IgM  . Thiopurine methyltransferase(tpmt)rbc  . Hepatitis panel, acute   No orders of the defined types were placed in this encounter.   Face-to-face time spent with  patient was 35 minutes. Greater than 50% of time was spent in counseling and coordination of care.  Follow-Up Instructions: Return in about 3 months (around 05/16/2017) for Pseudogout, Osteoarthritis.   Gearldine Bienenstockaylor M Valina Maes, PA-C  Note - This record has been created using Dragon software.  Chart creation errors have been sought, but may not always  have been located. Such creation errors do not reflect on  the standard of medical care.

## 2017-02-13 NOTE — Progress Notes (Signed)
Patient ID: Marissa Larson, female   DOB: 05/15/1955, 62 y.o.   MRN: [id number]  Reason for Consult: New Patient (Initial Visit) (blue toe)   Referred by Coralie Carpen, MD  Subjective:     HPI:  Marissa Larson is a 62 y.o. female presents for evaluation of bilateral blue toes.  States that she has never had this before.  This started 6 months ago and has been intermittent.  She does not know any exacerbating or relieving factors.  She works from home.  She states that it limits her ability to even walk when it is occurring.  It has become more constant.  She does not have any diagnosis of her knowledge does not have issues with her hands.  Initially started with her left foot but has become just as bad in the right foot.  She has a history of bilateral knee surgeries and right foot surgery but timing is unrelated.  She has no tissue loss or ulceration.  She does not take any blood thinners and states that she cannot take aspirin for GI reasons.  She is followed by Dr. Corliss Skains rheumatology.  Past Medical History:  Diagnosis Date  . Arthritis   . Chest pain 06/2013   Left sided  . Chronic leg pain 06/2013  . Fatigue   . Nonproductive cough 06/2013  . Overweight 01/18/2014  . Sleep difficulties    Change in sleep patterns  . SOB (shortness of breath) 06/2013  . Tachycardia 06/2013  . Thyroid disease    hypo  . UTI (lower urinary tract infection)    Family History  Problem Relation Age of Onset  . Migraines Other        Fam Hx   . Heart disease Other        Fam Hx  . Thyroid disease Other        Fam Hx - She also has thyroid disease  . Diabetes Other        Fam Hx  . Arthritis Other        Fam Hx  . Gallbladder disease Other        Fam Hx  . Cancer Other        Fam Hx - Pancreatic  . Hypertension Other        Fam Hx  . Heart attack Cousin        Multiple cousins with MI's before 76 years of age  . Heart disease Mother 82  . Hyperlipidemia Mother 61  . Hypertension  Mother 89  . Stroke Mother 69  . Heart disease Maternal Grandmother   . Cancer Father 56       pancreatic  . Diabetes Brother 50  . Heart attack Maternal Uncle   . Heart attack Paternal Aunt    Past Surgical History:  Procedure Laterality Date  . ABDOMINAL SURGERY  1994  . FOOT SURGERY Right 2009  . KIDNEY SURGERY Left 1993  . KNEE SURGERY     Multiple knee surgeries  . OOPHORECTOMY Left 1994  . URETEROSCOPY VIA URETEROSTOMY Left 1994    Short Social History:  Social History   Tobacco Use  . Smoking status: Never Smoker  . Smokeless tobacco: Never Used  Substance Use Topics  . Alcohol use: Yes    Alcohol/week: 1.2 oz    Types: 2 Glasses of wine per week    Allergies  Allergen Reactions  . Advil [Ibuprofen] Nausea Only    History of gastric ulcer with  hemorrhage secondary to ibuprofen use  . Penicillins Hives and Itching  . Sulfa Antibiotics Hives and Itching    Bactrim    Current Outpatient Medications  Medication Sig Dispense Refill  . acetaminophen (TYLENOL) 325 MG tablet Take 2 tablets (650 mg total) by mouth every 6 (six) hours as needed for moderate pain, fever or headache (costochondritis pain).    . Ascorbic Acid (VITAMIN C) 1000 MG tablet Take 1,000 mg by mouth daily.    . chlorpheniramine-HYDROcodone (TUSSIONEX) 10-8 MG/5ML SUER Take 5 mLs by mouth every 12 (twelve) hours as needed for cough (cough, will cause drowsiness.). 200 mL 0  . COLCRYS 0.6 MG tablet TAKE 1 TABLET BY MOUTH 2 TIMES DAILY. 180 tablet 1  . escitalopram (LEXAPRO) 10 MG tablet Take 10 mg by mouth daily.    . Estradiol-Estriol-Progesterone (BIEST/PROGESTERONE) CREA Place onto the skin daily.    . eszopiclone (LUNESTA) 1 MG TABS tablet Take 1 tablet by mouth at bedtime as needed.    . famotidine (PEPCID) 40 MG tablet Take 40 mg by mouth.    . Flaxseed, Linseed, (FLAXSEED OIL PO) Take 3 capsules by mouth daily.    . hydrochlorothiazide (MICROZIDE) 12.5 MG capsule Take 1 capsule by mouth  daily.    Marland Kitchen levothyroxine (SYNTHROID, LEVOTHROID) 112 MCG tablet Take 112 mcg by mouth daily before breakfast.    . magnesium gluconate (MAGONATE) 500 MG tablet Take 1,000 mg by mouth daily.    . Omega-3 Fatty Acids (FISH OIL PO) Take 2 capsules by mouth daily.    . Progesterone Micronized (PROGESTERONE PO) Take 150 mg by mouth daily.    . TURMERIC PO Take 100 mg by mouth daily.    . Vitamin D, Ergocalciferol, (DRISDOL) 50000 units CAPS capsule TAKE 1 CAPSULE BY MOUTH EVERY 7 DAYS. 12 capsule 20   No current facility-administered medications for this visit.     Review of Systems  Constitutional:  Constitutional negative. HENT: HENT negative.  Respiratory: Respiratory negative.  Cardiovascular: Positive for irregular heartbeat.  Musculoskeletal: Positive for leg pain.  Skin:       Discolored toes Neurological: Positive for numbness.  Psychiatric: Psychiatric negative.        Objective:  Objective   There were no vitals filed for this visit. There is no height or weight on file to calculate BMI.  Physical Exam  Constitutional: She is oriented to person, place, and time. She appears well-developed.  HENT:  Head: Normocephalic.  Eyes: Pupils are equal, round, and reactive to light.  Neck: Normal range of motion.  Cardiovascular: Normal rate.  Pulses:      Radial pulses are 2+ on the right side, and 2+ on the left side.       Femoral pulses are 2+ on the right side, and 2+ on the left side.      Popliteal pulses are 2+ on the right side, and 2+ on the left side.       Dorsalis pedis pulses are 2+ on the left side.       Posterior tibial pulses are 2+ on the right side.  Pulmonary/Chest: Effort normal.  Abdominal: Soft. She exhibits no mass.  Musculoskeletal: Normal range of motion. She exhibits no edema.  Toes bilaterally are purple discolored, cap refill <2 seconds  Neurological: She is alert and oriented to person, place, and time.  Skin: Skin is warm.  Psychiatric: She  has a normal mood and affect. Her behavior is normal. Judgment and thought content normal.  Data: I have independently interpreted her ABIs to be 1.13 on the right and 1.17 on the left with tbi greater than 0.8 bilaterally.     Assessment/Plan:     62 year old female presents for evaluation of bilateral discoloration of her toes.  I think given that she has a rheumatologic history and also that this is bilaterally occurring it is likely nonvascular in origin.  I have asked her to follow-up with her rheumatologist and she will make an appointment on her own.  She is highly worried about this though and from that standpoint think it is reasonable to get a CT scan of her chest abdomen pelvis to rule out any proximal lesions.  She does have a significant family history of coronary artery disease and I have asked her that if she can start taking enteric-coated 81 mg aspirin this would be cardioprotective.  She demonstrates good understanding we will get her CAT scan to see her in 4 weeks.     Maeola HarmanBrandon Christopher Reizy Dunlow MD [facility]

## 2017-02-16 ENCOUNTER — Ambulatory Visit (INDEPENDENT_AMBULATORY_CARE_PROVIDER_SITE_OTHER): Payer: BLUE CROSS/BLUE SHIELD | Admitting: Physician Assistant

## 2017-02-16 ENCOUNTER — Encounter: Payer: Self-pay | Admitting: Physician Assistant

## 2017-02-16 ENCOUNTER — Other Ambulatory Visit: Payer: Self-pay

## 2017-02-16 VITALS — BP 157/81 | HR 77 | Resp 17 | Ht 66.0 in | Wt 185.0 lb

## 2017-02-16 DIAGNOSIS — M170 Bilateral primary osteoarthritis of knee: Secondary | ICD-10-CM

## 2017-02-16 DIAGNOSIS — I10 Essential (primary) hypertension: Secondary | ICD-10-CM

## 2017-02-16 DIAGNOSIS — E790 Hyperuricemia without signs of inflammatory arthritis and tophaceous disease: Secondary | ICD-10-CM

## 2017-02-16 DIAGNOSIS — M47816 Spondylosis without myelopathy or radiculopathy, lumbar region: Secondary | ICD-10-CM

## 2017-02-16 DIAGNOSIS — M160 Bilateral primary osteoarthritis of hip: Secondary | ICD-10-CM | POA: Diagnosis not present

## 2017-02-16 DIAGNOSIS — M1189 Other specified crystal arthropathies, multiple sites: Secondary | ICD-10-CM | POA: Diagnosis not present

## 2017-02-16 DIAGNOSIS — M19041 Primary osteoarthritis, right hand: Secondary | ICD-10-CM | POA: Diagnosis not present

## 2017-02-16 DIAGNOSIS — M19072 Primary osteoarthritis, left ankle and foot: Secondary | ICD-10-CM

## 2017-02-16 DIAGNOSIS — K449 Diaphragmatic hernia without obstruction or gangrene: Secondary | ICD-10-CM

## 2017-02-16 DIAGNOSIS — L819 Disorder of pigmentation, unspecified: Secondary | ICD-10-CM | POA: Diagnosis not present

## 2017-02-16 DIAGNOSIS — I75023 Atheroembolism of bilateral lower extremities: Secondary | ICD-10-CM

## 2017-02-16 DIAGNOSIS — M19071 Primary osteoarthritis, right ankle and foot: Secondary | ICD-10-CM

## 2017-02-16 DIAGNOSIS — R945 Abnormal results of liver function studies: Secondary | ICD-10-CM

## 2017-02-16 DIAGNOSIS — E559 Vitamin D deficiency, unspecified: Secondary | ICD-10-CM

## 2017-02-16 DIAGNOSIS — Z87442 Personal history of urinary calculi: Secondary | ICD-10-CM

## 2017-02-16 DIAGNOSIS — R5383 Other fatigue: Secondary | ICD-10-CM

## 2017-02-16 DIAGNOSIS — R7989 Other specified abnormal findings of blood chemistry: Secondary | ICD-10-CM

## 2017-02-16 DIAGNOSIS — K760 Fatty (change of) liver, not elsewhere classified: Secondary | ICD-10-CM | POA: Diagnosis not present

## 2017-02-16 DIAGNOSIS — T691XXA Chilblains, initial encounter: Secondary | ICD-10-CM | POA: Diagnosis not present

## 2017-02-16 DIAGNOSIS — F4323 Adjustment disorder with mixed anxiety and depressed mood: Secondary | ICD-10-CM

## 2017-02-16 DIAGNOSIS — F5101 Primary insomnia: Secondary | ICD-10-CM

## 2017-02-16 DIAGNOSIS — Z119 Encounter for screening for infectious and parasitic diseases, unspecified: Secondary | ICD-10-CM

## 2017-02-16 DIAGNOSIS — M19042 Primary osteoarthritis, left hand: Secondary | ICD-10-CM

## 2017-02-20 DIAGNOSIS — E039 Hypothyroidism, unspecified: Secondary | ICD-10-CM | POA: Diagnosis not present

## 2017-02-20 DIAGNOSIS — R945 Abnormal results of liver function studies: Secondary | ICD-10-CM | POA: Diagnosis not present

## 2017-02-20 LAB — TSH: TSH: 0.61 (ref 0.41–5.90)

## 2017-02-20 LAB — HEPATIC FUNCTION PANEL
ALK PHOS: 80 (ref 25–125)
ALT: 43 — AB (ref 7–35)
AST: 37 — AB (ref 13–35)
BILIRUBIN DIRECT: 0.1 (ref 0.01–0.4)
Bilirubin, Total: 0.6

## 2017-02-23 ENCOUNTER — Telehealth: Payer: Self-pay | Admitting: Rheumatology

## 2017-02-23 NOTE — Telephone Encounter (Signed)
Patient called to get her blood test results.

## 2017-02-24 ENCOUNTER — Encounter: Payer: Self-pay | Admitting: Physician Assistant

## 2017-02-24 DIAGNOSIS — E8889 Other specified metabolic disorders: Secondary | ICD-10-CM | POA: Insufficient documentation

## 2017-02-24 LAB — CBC WITH DIFFERENTIAL/PLATELET
BASOS PCT: 1 %
Basophils Absolute: 69 cells/uL (ref 0–200)
Eosinophils Absolute: 69 cells/uL (ref 15–500)
Eosinophils Relative: 1 %
HCT: 43.6 % (ref 35.0–45.0)
HEMOGLOBIN: 15 g/dL (ref 11.7–15.5)
Lymphs Abs: 2677 cells/uL (ref 850–3900)
MCH: 31.1 pg (ref 27.0–33.0)
MCHC: 34.4 g/dL (ref 32.0–36.0)
MCV: 90.5 fL (ref 80.0–100.0)
MONOS PCT: 6.8 %
MPV: 10.8 fL (ref 7.5–12.5)
NEUTROS ABS: 3616 {cells}/uL (ref 1500–7800)
Neutrophils Relative %: 52.4 %
PLATELETS: 247 10*3/uL (ref 140–400)
RBC: 4.82 10*6/uL (ref 3.80–5.10)
RDW: 12.7 % (ref 11.0–15.0)
TOTAL LYMPHOCYTE: 38.8 %
WBC: 6.9 10*3/uL (ref 3.8–10.8)
WBCMIX: 469 {cells}/uL (ref 200–950)

## 2017-02-24 LAB — COMPLETE METABOLIC PANEL WITH GFR
AG Ratio: 1.9 (calc) (ref 1.0–2.5)
ALT: 43 U/L — ABNORMAL HIGH (ref 6–29)
AST: 38 U/L — ABNORMAL HIGH (ref 10–35)
Albumin: 4.8 g/dL (ref 3.6–5.1)
Alkaline phosphatase (APISO): 86 U/L (ref 33–130)
BUN: 13 mg/dL (ref 7–25)
CALCIUM: 9.9 mg/dL (ref 8.6–10.4)
CO2: 32 mmol/L (ref 20–32)
CREATININE: 0.54 mg/dL (ref 0.50–1.05)
Chloride: 99 mmol/L (ref 98–110)
GFR, EST AFRICAN AMERICAN: 120 mL/min/{1.73_m2} (ref >=60)
GFR, Est Non African American: 103 mL/min/{1.73_m2} (ref >=60)
GLUCOSE: 86 mg/dL (ref 65–99)
Globulin: 2.5 g/dL (calc) (ref 1.9–3.7)
Potassium: 3.8 mmol/L (ref 3.5–5.3)
Sodium: 139 mmol/L (ref 135–146)
TOTAL PROTEIN: 7.3 g/dL (ref 6.1–8.1)
Total Bilirubin: 0.6 mg/dL (ref 0.2–1.2)

## 2017-02-24 LAB — BETA-2 GLYCOPROTEIN ANTIBODIES: Beta-2 Glyco I IgG: <9 SGU (ref <=20)

## 2017-02-24 LAB — PROTEIN ELECTROPHORESIS, SERUM, WITH REFLEX
ALBUMIN ELP: 4.6 g/dL (ref 3.8–4.8)
ALPHA 1: 0.3 g/dL (ref 0.2–0.3)
Alpha 2: 0.6 g/dL (ref 0.5–0.9)
Beta 2: 0.4 g/dL (ref 0.2–0.5)
Beta Globulin: 0.5 g/dL (ref 0.4–0.6)
Gamma Globulin: 0.8 g/dL (ref 0.8–1.7)
Total Protein: 7.1 g/dL (ref 6.1–8.1)

## 2017-02-24 LAB — URINALYSIS, ROUTINE W REFLEX MICROSCOPIC
Bacteria, UA: NONE SEEN /HPF
Bilirubin Urine: NEGATIVE
GLUCOSE, UA: NEGATIVE
HGB URINE DIPSTICK: NEGATIVE
Hyaline Cast: NONE SEEN /LPF
Ketones, ur: NEGATIVE
Nitrite: NEGATIVE
PROTEIN: NEGATIVE
Specific Gravity, Urine: 1.012 (ref 1.001–1.03)
pH: 7 (ref 5.0–8.0)

## 2017-02-24 LAB — HEPATITIS PANEL, ACUTE
HEP B C IGM: NONREACTIVE
HEP C AB: NONREACTIVE
Hep A IgM: NONREACTIVE
Hepatitis B Surface Ag: NONREACTIVE
SIGNAL TO CUT-OFF: 0.01 (ref <1.00)

## 2017-02-24 LAB — THIOPURINE METHYLTRANSFERASE (TPMT), RBC: THIOPURINE METHYLTRANSFERASE, RBC: 8 nmol/h/mL — AB

## 2017-02-24 LAB — CARDIOLIPIN ANTIBODIES, IGG, IGM, IGA
Anticardiolipin IgA: <11 [APL'U]
Anticardiolipin IgG: <14 [GPL'U]
Anticardiolipin IgM: <12 [MPL'U]

## 2017-02-24 LAB — QUANTIFERON-TB GOLD PLUS
NIL: 0.01 IU/mL
QuantiFERON-TB Gold Plus: NEGATIVE
TB1-NIL: 0.01 [IU]/mL
TB2-NIL: 0.01 [IU]/mL

## 2017-02-24 LAB — FLUORESCENT TREPONEMAL AB(FTA)-IGG-BLD: Fluorescent Treponemal ABS: NONREACTIVE

## 2017-02-24 LAB — SEDIMENTATION RATE: Sed Rate: 2 mm/h (ref 0–30)

## 2017-02-24 LAB — IGG, IGA, IGM
IGG (IMMUNOGLOBIN G), SERUM: 793 mg/dL (ref 694–1618)
IgM, Serum: 55 mg/dL (ref 48–271)
Immunoglobulin A: 205 mg/dL (ref 81–463)

## 2017-02-24 LAB — PAN-ANCA
ANCA Screen: NEGATIVE
Myeloperoxidase Abs: <1 AI
Serine Protease 3: <1 AI

## 2017-02-24 LAB — CRYOGLOBULIN: CRYOGLOBULIN, QUALITATIVE ANALYSIS: NOT DETECTED

## 2017-02-24 LAB — ANA: Anti Nuclear Antibody(ANA): NEGATIVE

## 2017-02-24 NOTE — Telephone Encounter (Signed)
Patient advised we are awaiting the rest of her lab results and will call once they have resulted.

## 2017-02-24 NOTE — Progress Notes (Signed)
AST and ALT elevated.  Please advise patient to avoid NSAIDs, tylenol, and alcohol.  We will continue to monitor LFTs.  UA shows + WBCs and leukocytes.  Please ask patient if she is having any symptoms of a UTI.  If so, please advise her to follow up with PCP.   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    All other labs are WNL.

## 2017-02-27 DIAGNOSIS — K760 Fatty (change of) liver, not elsewhere classified: Secondary | ICD-10-CM | POA: Diagnosis not present

## 2017-02-27 DIAGNOSIS — R5383 Other fatigue: Secondary | ICD-10-CM | POA: Diagnosis not present

## 2017-02-27 DIAGNOSIS — R945 Abnormal results of liver function studies: Secondary | ICD-10-CM | POA: Diagnosis not present

## 2017-02-27 DIAGNOSIS — E039 Hypothyroidism, unspecified: Secondary | ICD-10-CM | POA: Diagnosis not present

## 2017-03-05 DIAGNOSIS — Q625 Duplication of ureter: Secondary | ICD-10-CM | POA: Diagnosis not present

## 2017-03-05 DIAGNOSIS — N200 Calculus of kidney: Secondary | ICD-10-CM | POA: Diagnosis not present

## 2017-03-05 DIAGNOSIS — Z87442 Personal history of urinary calculi: Secondary | ICD-10-CM | POA: Diagnosis not present

## 2017-03-12 ENCOUNTER — Ambulatory Visit
Admission: RE | Admit: 2017-03-12 | Discharge: 2017-03-12 | Disposition: A | Discharge status: Home / Self Care | Payer: BLUE CROSS/BLUE SHIELD | Source: Ambulatory Visit | Attending: Vascular Surgery | Admitting: Vascular Surgery

## 2017-03-12 DIAGNOSIS — I75023 Atheroembolism of bilateral lower extremities: Secondary | ICD-10-CM

## 2017-03-12 DIAGNOSIS — R230 Cyanosis: Secondary | ICD-10-CM | POA: Diagnosis not present

## 2017-03-12 MED ORDER — IOPAMIDOL (ISOVUE-370) INJECTION 76%
100.0000 mL | Freq: Once | INTRAVENOUS | Status: AC | PRN
  Administered 2017-03-12: 100 mL via INTRAVENOUS

## 2017-03-13 ENCOUNTER — Other Ambulatory Visit: Payer: Self-pay

## 2017-03-13 ENCOUNTER — Encounter: Payer: Self-pay | Admitting: Vascular Surgery

## 2017-03-13 ENCOUNTER — Ambulatory Visit (INDEPENDENT_AMBULATORY_CARE_PROVIDER_SITE_OTHER): Payer: BLUE CROSS/BLUE SHIELD | Admitting: Vascular Surgery

## 2017-03-13 VITALS — BP 139/85 | HR 73 | Temp 98.0°F | Ht 66.0 in | Wt 182.0 lb

## 2017-03-13 DIAGNOSIS — R230 Cyanosis: Secondary | ICD-10-CM | POA: Diagnosis not present

## 2017-03-13 NOTE — Progress Notes (Signed)
Patient ID: Marissa Larson, female   DOB: 04/11/1955, 62 y.o.   MRN: [id number]  Reason for Consult: Follow-up (4 wk f/u CTA chest/abd/pel. )   Referred by Thomasene Lot, DO  Subjective:     HPI:  Marissa Larson is a 61 y.o. female follows up after CT angios to evaluate purple toes.  Since that time she is also seen her rheumatologist and dermatologist and has been given a diagnosis.  At last visit we had low suspicion that she had any vascular disease but elected to have CT scan for confirmation.  Her purple toes have been stable since last visit.  Past Medical History:  Diagnosis Date  . Arthritis   . Chest pain 06/2013   Left sided  . Chronic leg pain 06/2013  . Fatigue   . Nonproductive cough 06/2013  . Overweight 01/18/2014  . Sleep difficulties    Change in sleep patterns  . SOB (shortness of breath) 06/2013  . Tachycardia 06/2013  . Thyroid disease    hypo  . UTI (lower urinary tract infection)    Family History  Problem Relation Age of Onset  . Migraines Other        Fam Hx   . Heart disease Other        Fam Hx  . Thyroid disease Other        Fam Hx - She also has thyroid disease  . Diabetes Other        Fam Hx  . Arthritis Other        Fam Hx  . Gallbladder disease Other        Fam Hx  . Cancer Other        Fam Hx - Pancreatic  . Hypertension Other        Fam Hx  . Heart attack Cousin        Multiple cousins with MI's before 57 years of age  . Heart disease Mother 33  . Hyperlipidemia Mother 53  . Hypertension Mother 20  . Stroke Mother 73  . Heart disease Maternal Grandmother   . Cancer Father 72       pancreatic  . Diabetes Brother 50  . Heart attack Maternal Uncle   . Heart attack Paternal Aunt    Past Surgical History:  Procedure Laterality Date  . ABDOMINAL SURGERY  1994  . FOOT SURGERY Right 2009  . KIDNEY SURGERY Left 1993  . KNEE SURGERY     Multiple knee surgeries  . OOPHORECTOMY Left 1994  . URETEROSCOPY VIA URETEROSTOMY Left 1994      Short Social History:  Social History   Tobacco Use  . Smoking status: Never Smoker  . Smokeless tobacco: Never Used  Substance Use Topics  . Alcohol use: No    Frequency: Never    Allergies  Allergen Reactions  . Advil [Ibuprofen] Nausea Only    History of gastric ulcer with hemorrhage secondary to ibuprofen use  . Penicillins Hives and Itching  . Sulfa Antibiotics Hives and Itching    Bactrim    Current Outpatient Medications  Medication Sig Dispense Refill  . Ascorbic Acid (VITAMIN C) 1000 MG tablet Take 1,000 mg by mouth daily.    . chlorpheniramine-HYDROcodone (TUSSIONEX) 10-8 MG/5ML SUER Take 5 mLs by mouth every 12 (twelve) hours as needed for cough (cough, will cause drowsiness.). 200 mL 0  . COLCRYS 0.6 MG tablet TAKE 1 TABLET BY MOUTH 2 TIMES DAILY. 180 tablet 1  . escitalopram (  LEXAPRO) 10 MG tablet Take 10 mg by mouth daily.    . Estradiol-Estriol-Progesterone (BIEST/PROGESTERONE) CREA Place onto the skin daily.    . eszopiclone (LUNESTA) 1 MG TABS tablet Take 1 tablet by mouth at bedtime as needed.    . Flaxseed, Linseed, (FLAXSEED OIL PO) Take 3 capsules by mouth daily.    . hydrochlorothiazide (MICROZIDE) 12.5 MG capsule Take 1 capsule by mouth daily.    Marland Kitchen. levothyroxine (SYNTHROID, LEVOTHROID) 112 MCG tablet Take 112 mcg by mouth daily before breakfast.    . magnesium gluconate (MAGONATE) 500 MG tablet Take 1,000 mg by mouth daily.    . Misc Natural Products (TART CHERRY ADVANCED PO) Take by mouth daily.    . Omega-3 Fatty Acids (FISH OIL PO) Take 2 capsules by mouth daily.    . Progesterone Micronized (PROGESTERONE PO) Take 150 mg by mouth daily.    . TURMERIC PO Take 100 mg by mouth daily.    . Vitamin D, Ergocalciferol, (DRISDOL) 50000 units CAPS capsule TAKE 1 CAPSULE BY MOUTH EVERY 7 DAYS. 12 capsule 20  . famotidine (PEPCID) 40 MG tablet Take 40 mg by mouth.     No current facility-administered medications for this visit.     Review of Systems   Constitutional:  Constitutional negative. HENT: HENT negative.  Eyes: Eyes negative.  Respiratory: Respiratory negative.  Cardiovascular: Cardiovascular negative.  Musculoskeletal: Musculoskeletal negative.  Skin:       Purple toes Hematologic: Hematologic/lymphatic negative.  Psychiatric: Psychiatric negative.        Objective:  Objective   Vitals:   03/13/17 0922  BP: 139/85  Pulse: 73  Temp: 98 F (36.7 C)  TempSrc: Oral  SpO2: 98%  Weight: 182 lb (82.6 kg)  Height: 5\' 6"  (1.676 m)   Body mass index is 29.38 kg/m.  Physical Exam  Constitutional: She is oriented to person, place, and time. She appears well-developed.  HENT:  Head: Normocephalic.  Eyes: Pupils are equal, round, and reactive to light.  Neck: Normal range of motion.  Cardiovascular: Normal rate.  Pulses:      Popliteal pulses are 2+ on the right side, and 2+ on the left side.       Dorsalis pedis pulses are 2+ on the left side.       Posterior tibial pulses are 2+ on the right side.  Abdominal: Soft.  Musculoskeletal: Normal range of motion. She exhibits no edema.  Neurological: She is alert and oriented to person, place, and time.  Skin: Skin is warm and dry.  Psychiatric: She has a normal mood and affect. Her behavior is normal. Judgment and thought content normal.    Data: IMPRESSION: No evidence of thoracic or abdominal aortic dissection or aneurysm is noted. No significant atheromatous disease is noted.  Great vessels in the chest are widely patent.  Mesenteric and renal arteries are widely patent without stenosis.  Probable fatty infiltration of the liver.  No other abnormality seen in the chest, abdomen or pelvis.     Assessment/Plan:     62 year old female with history of purple toes and a negative CT scan suggesting it is other rheumatologic or dermatologic in nature.  We reviewed her CT scan together in the office today.  She has palpable pedal pulses bilaterally which I can  feel readily today.  She does not need vascular intervention at this time and can follow-up on a as needed basis.     Maeola HarmanBrandon Christopher Andree Golphin MD [facility]  [facility]

## 2017-05-15 NOTE — Progress Notes (Deleted)
Office Visit Note  Patient: Marissa Larson             Date of Birth: May 09, 1955           MRN: [id number]             PCP: Thomasene Lot, DO Referring: Thomasene Lot, DO Visit Date: 05/28/2017 Occupation: @    Subjective:  No chief complaint on file.   History of Present Illness: Marissa Larson is a 62 y.o. female ***   Activities of Daily Living:  Patient reports morning stiffness for *** {minute/hour:19697}.   Patient {ACTIONS;DENIES/REPORTS:21021675::"Denies"} nocturnal pain.  Difficulty dressing/grooming: {ACTIONS;DENIES/REPORTS:21021675::"Denies"} Difficulty climbing stairs: {ACTIONS;DENIES/REPORTS:21021675::"Denies"} Difficulty getting out of chair: {ACTIONS;DENIES/REPORTS:21021675::"Denies"} Difficulty using hands for taps, buttons, cutlery, and/or writing: {ACTIONS;DENIES/REPORTS:21021675::"Denies"}   No Rheumatology ROS completed.   PMFS History:  Patient Active Problem List   Diagnosis Date Noted  . 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 Obesity, Class I, BMI 30-34.9 12/03/2016  . Kidney stones 09/12/2016  . Nausea 09/12/2016  . High risk medication use 07/30/2016  . NAFLD (nonalcoholic fatty liver disease) 40/98/1191  . Primary osteoarthritis of both feet 04/12/2016  . History of kidney stones 04/12/2016  . Primary osteoarthritis of both hands 04/11/2016  . Bilateral primary osteoarthritis of hip 04/11/2016  . Primary osteoarthritis of both knees 04/11/2016  . Spondylosis of lumbar region without myelopathy or radiculopathy 04/11/2016  . Hyperuricemia 04/11/2016  . Primary insomnia 04/11/2016  . Flank pain 04/02/2016  . Sinusitis 04/02/2016  . Elevated HDL 11/11/2015  . Elevated LFTs 11/11/2015  . Adjustment disorder with mixed anxiety and depressed mood 11/11/2015  . Dysuria 11/11/2015  . Hypothyroidism 10/14/2015  . Pseudogout involving multiple joints 10/14/2015  . Vitamin D deficiency 10/14/2015  . Hormone replacement  therapy- per GYN 10/14/2015  . HTN (hypertension) 10/14/2015  . Fatigue 10/14/2015  . h/o Hiatal hernia 10/14/2015  . Sleep difficulties 10/14/2015  . Generalized OA 10/14/2015  . Counseling on health promotion and disease prevention 10/14/2015  . 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 Age-related nuclear cataract of both eyes 01/25/2015  . Posterior vitreous detachment of left eye 01/25/2015  . chronic Palpitations 07/19/2013    Past Medical History:  Diagnosis Date  . Arthritis   . Chest pain 06/2013   Left sided  . Chronic leg pain 06/2013  . Fatigue   . Nonproductive cough 06/2013  . Overweight 01/18/2014  . Sleep difficulties    Change in sleep patterns  . SOB (shortness of breath) 06/2013  . Tachycardia 06/2013  . Thyroid disease    hypo  . UTI (lower urinary tract infection)     Family History  Problem Relation Age of Onset  . Migraines Other        Fam Hx   . Heart disease Other        Fam Hx  . Thyroid disease Other        Fam Hx - She also has thyroid disease  . Diabetes Other        Fam Hx  . Arthritis Other        Fam Hx  . Gallbladder disease Other        Fam Hx  . Cancer Other        Fam Hx - Pancreatic  . Hypertension Other        Fam Hx  . Heart attack Cousin        Multiple cousins with MI's before 66 years of age  .  Heart disease Mother 38  . Hyperlipidemia Mother 53  . Hypertension Mother 43  . Stroke Mother 110  . Heart disease Maternal Grandmother   . Cancer Father 28       pancreatic  . Diabetes Brother 50  . Heart attack Maternal Uncle   . Heart attack Paternal Aunt    Past Surgical History:  Procedure Laterality Date  . ABDOMINAL SURGERY  1994  . FOOT SURGERY Right 2009  . KIDNEY SURGERY Left 1993  . KNEE SURGERY     Multiple knee surgeries  . OOPHORECTOMY Left 1994  . URETEROSCOPY VIA URETEROSTOMY Left 1994   Social History   Social History Narrative  . Not on file     Objective: Vital Signs: There were no vitals taken  for this visit.   Physical Exam   Musculoskeletal Exam: ***  CDAI Exam: No CDAI exam completed.    Investigation: No additional findings. CBC Latest Ref Rng & Units 02/16/2017 12/08/2016 11/11/2016  WBC 3.8 - 10.8 Thousand/uL 6.9 5.9 9.5  Hemoglobin 11.7 - 15.5 g/dL 09.8 11.9 14.7  Hematocrit 35.0 - 45.0 % 43.6 44.0 42.7  Platelets 140 - 400 Thousand/uL 247 248 240   CMP Latest Ref Rng & Units 02/20/2017 02/16/2017 02/16/2017  Glucose 65 - 99 mg/dL - - 86  BUN 7 - 25 mg/dL - - 13  Creatinine 8.29 - 1.05 mg/dL - - 5.62  Sodium 130 - 146 mmol/L - - 139  Potassium 3.5 - 5.3 mmol/L - - 3.8  Chloride 98 - 110 mmol/L - - 99  CO2 20 - 32 mmol/L - - 32  Calcium 8.6 - 10.4 mg/dL - - 9.9  Total Protein 6.1 - 8.1 g/dL - 7.1 7.3  Total Bilirubin 0.2 - 1.2 mg/dL - - 0.6  Alkaline Phos 25 - 125 80 - -  AST 13 - 35 37(A) - 38(H)  ALT 7 - 35 43(A) - 43(H)     Imaging: No results found.  Speciality Comments: No specialty comments available.    Procedures:  No procedures performed Allergies: Advil [ibuprofen]; Penicillins; and Sulfa antibiotics   Assessment / Plan:     Visit Diagnoses: No diagnosis found.    Orders: No orders of the defined types were placed in this encounter.  No orders of the defined types were placed in this encounter.   Face-to-face time spent with patient was *** minutes. 50% of time was spent in counseling and coordination of care.  Follow-Up Instructions: No follow-ups on file.   Gearldine Bienenstock, PA-C  Note - This record has been created using Dragon software.  Chart creation errors have been sought, but may not always  have been located. Such creation errors do not reflect on  the standard of medical care.

## 2017-05-18 ENCOUNTER — Other Ambulatory Visit: Payer: Self-pay | Admitting: Rheumatology

## 2017-05-18 NOTE — Telephone Encounter (Signed)
ok 

## 2017-05-18 NOTE — Telephone Encounter (Signed)
Last Visit: 02/16/17 Next Visit: 05/28/17 Labs: 02/16/17 AST 38 ALT 43  Okay to refill Colcrys?

## 2017-05-28 ENCOUNTER — Ambulatory Visit: Payer: BLUE CROSS/BLUE SHIELD | Admitting: Physician Assistant

## 2017-06-03 NOTE — Progress Notes (Signed)
Office Visit Note  Patient: Marissa Larson             Date of Birth: Jan 03, 1956           MRN: [id number]             PCP: Thomasene Lot, DO Referring: Thomasene Lot, DO Visit Date: 06/05/2017 Occupation: @    Subjective:  Discoloration of toes    History of Present Illness: Kambrey Hagger is a 62 y.o. female with history of pseudogout and osteoarthritis.  She states that for the past 1 month she has had increased joint pain, muscle aches, and fatigue.  She states she had a period of time that lasted 2 weeks where she has swollen lymph nodes in the cervical, axillary, and inguinal region.  She states she has also had several episodes of night sweats.  She states she has been aching in her hands, knees, ankles, and lower back. She has been taking Advil PRN for pain relief.  She denies any joint swelling.  She denies any pseudogout flares.  She continues to take Colcrys 0.6 mg BID. Increased numbness in hands and feet, pins and needles  She continues to have discoloration in all toes.  She has also been experiencing numbness in tingling in bilateral hands and feet.  She has not been able to identify a trigger.  She was cleared by a vascular specialist.     Activities of Daily Living:  Patient reports morning stiffness for 15-20 minutes.   Patient Reports nocturnal pain.  Difficulty dressing/grooming: Denies Difficulty climbing stairs: Reports Difficulty getting out of chair: Reports Difficulty using hands for taps, buttons, cutlery, and/or writing: Reports   Review of Systems  Constitutional: Positive for fatigue.  HENT: Negative for mouth sores, mouth dryness and nose dryness.   Eyes: Negative for pain, visual disturbance and dryness.  Respiratory: Negative for cough, hemoptysis, shortness of breath and difficulty breathing.   Cardiovascular: Negative for chest pain, palpitations, hypertension and swelling in legs/feet.  Gastrointestinal: Negative for blood in  stool, constipation and diarrhea.  Endocrine: Negative for increased urination.  Genitourinary: Negative for painful urination.  Musculoskeletal: Positive for arthralgias, joint pain, myalgias, morning stiffness and myalgias. Negative for joint swelling, muscle weakness and muscle tenderness.  Skin: Positive for color change and rash. Negative for pallor, hair loss, nodules/bumps, skin tightness, ulcers and sensitivity to sunlight.  Allergic/Immunologic: Negative for susceptible to infections.  Neurological: Negative for dizziness, numbness, headaches and weakness.  Hematological: Positive for swollen glands.  Psychiatric/Behavioral: Positive for sleep disturbance. Negative for depressed mood. The patient is not nervous/anxious.     PMFS History:  Patient Active Problem List   Diagnosis Date Noted  . 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 Obesity, Class I, BMI 30-34.9 12/03/2016  . Kidney stones 09/12/2016  . Nausea 09/12/2016  . High risk medication use 07/30/2016  . NAFLD (nonalcoholic fatty liver disease) 56/21/3086  . Primary osteoarthritis of both feet 04/12/2016  . History of kidney stones 04/12/2016  . Primary osteoarthritis of both hands 04/11/2016  . Bilateral primary osteoarthritis of hip 04/11/2016  . Primary osteoarthritis of both knees 04/11/2016  . Spondylosis of lumbar region without myelopathy or radiculopathy 04/11/2016  . Hyperuricemia 04/11/2016  . Primary insomnia 04/11/2016  . Flank pain 04/02/2016  . Sinusitis 04/02/2016  . Elevated HDL 11/11/2015  . Elevated LFTs 11/11/2015  . Adjustment disorder with mixed anxiety and depressed mood 11/11/2015  . Dysuria 11/11/2015  . Hypothyroidism 10/14/2015  . Pseudogout  involving multiple joints 10/14/2015  . Vitamin D deficiency 10/14/2015  . Hormone replacement therapy- per GYN 10/14/2015  . HTN (hypertension) 10/14/2015  . Fatigue 10/14/2015  . h/o Hiatal hernia 10/14/2015  . Sleep difficulties  10/14/2015  . Generalized OA 10/14/2015  . Counseling on health promotion and disease prevention 10/14/2015  . 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 Age-related nuclear cataract of both eyes 01/25/2015  . Posterior vitreous detachment of left eye 01/25/2015  . chronic Palpitations 07/19/2013    Past Medical History:  Diagnosis Date  . Arthritis   . Chest pain 06/2013   Left sided  . Chronic leg pain 06/2013  . Fatigue   . Nonproductive cough 06/2013  . Overweight 01/18/2014  . Sleep difficulties    Change in sleep patterns  . SOB (shortness of breath) 06/2013  . Tachycardia 06/2013  . Thyroid disease    hypo  . UTI (lower urinary tract infection)     Family History  Problem Relation Age of Onset  . Migraines Other        Fam Hx   . Heart disease Other        Fam Hx  . Thyroid disease Other        Fam Hx - She also has thyroid disease  . Diabetes Other        Fam Hx  . Arthritis Other        Fam Hx  . Gallbladder disease Other        Fam Hx  . Cancer Other        Fam Hx - Pancreatic  . Hypertension Other        Fam Hx  . Heart attack Cousin        Multiple cousins with MI's before 2 years of age  . Heart disease Mother 32  . Hyperlipidemia Mother 5  . Hypertension Mother 26  . Stroke Mother 22  . Heart disease Maternal Grandmother   . Cancer Father 28       pancreatic  . Diabetes Brother 50  . Heart attack Maternal Uncle   . Heart attack Paternal Aunt    Past Surgical History:  Procedure Laterality Date  . ABDOMINAL SURGERY  1994  . FOOT SURGERY Right 2009  . KIDNEY SURGERY Left 1993  . KNEE SURGERY     Multiple knee surgeries  . OOPHORECTOMY Left 1994  . URETEROSCOPY VIA URETEROSTOMY Left 1994   Social History   Social History Narrative  . Not on file     Objective: Vital Signs: BP (!) 141/81 (BP Location: Left Arm, Patient Position: Sitting, Cuff Size: Normal)   Pulse 76   Resp 16   Ht  (1.651 m)   Wt 182 lb (82.6 kg)   BMI 30.29  kg/m    Physical Exam  Constitutional: She is oriented to person, place, and time. She appears well-developed and well-nourished.  HENT:  Head: Normocephalic and atraumatic.  Eyes: Conjunctivae and EOM are normal.  Neck: Normal range of motion.  Cardiovascular: Normal rate, regular rhythm, normal heart sounds and intact distal pulses.  Pulmonary/Chest: Effort normal and breath sounds normal.  Abdominal: Soft. Bowel sounds are normal.  Lymphadenopathy:    She has no cervical adenopathy.  Neurological: She is alert and oriented to person, place, and time.  Skin: Skin is warm and dry. Capillary refill takes less than 2 seconds.  Psychiatric: She has a normal mood and affect. Her behavior is normal.  Nursing note and vitals reviewed.    Musculoskeletal Exam: C-spine limited range of motion with discomfort.  Thoracic and lumbar spine good range of motion.  No midline spinal tenderness.  She has bilateral SI joint tenderness.  Shoulder joints, elbow joints, wrist joints, MCPs, PIPs, DIPs good range of motion no synovitis.  She has PIP and DIP synovial thickening consistent with osteoarthritis of bilateral hands  Hip joints very limited range of motion with discomfort.  Tenderness of bilateral trochanteric bursa.  Knee joints, ankle joints, MTPs, PIPs, DIPs good range of motion no synovitis.  No warmth or effusion of bilateral knee joints.  CDAI Exam: No CDAI exam completed.    Investigation: No additional findings. CBC Latest Ref Rng & Units 02/16/2017 12/08/2016 11/11/2016  WBC 3.8 - 10.8 Thousand/uL 6.9 5.9 9.5  Hemoglobin 11.7 - 15.5 g/dL 16.1 09.6 04.5  Hematocrit 35.0 - 45.0 % 43.6 44.0 42.7  Platelets 140 - 400 Thousand/uL 247 248 240   CMP Latest Ref Rng & Units 02/20/2017 02/16/2017 02/16/2017  Glucose 65 - 99 mg/dL - - 86  BUN 7 - 25 mg/dL - - 13  Creatinine 4.09 - 1.05 mg/dL - - 8.11  Sodium 914 - 146 mmol/L - - 139  Potassium 3.5 - 5.3 mmol/L - - 3.8  Chloride 98 - 110 mmol/L -  - 99  CO2 20 - 32 mmol/L - - 32  Calcium 8.6 - 10.4 mg/dL - - 9.9  Total Protein 6.1 - 8.1 g/dL - 7.1 7.3  Total Bilirubin 0.2 - 1.2 mg/dL - - 0.6  Alkaline Phos 25 - 125 80 - -  AST 13 - 35 37(A) - 38(H)  ALT 7 - 35 43(A) - 43(H)    Imaging: No results found.  Speciality Comments: No specialty comments available.    Procedures:  No procedures performed Allergies: Advil [ibuprofen]; Penicillins; and Sulfa antibiotics   Assessment / Plan:     Visit Diagnoses: Pseudogout involving multiple joints: She has not had any recent flares of pseudogout.  She continues to take Colcrys 0.6 mg BID.  She does not need any refills at this time.    Hyperuricemia: Uric acid was 5.6 on 11/11/16  Discoloration of skin of toes: She was evaluated by a vascular specialist as well as a dermatologist.  There was no vascular origin.  The dermatologist did not biopsy but felt that the discoloration was due to chilblains.  She has not been able to identify a trigger.  Her toes were cool on exam today.  She has been experiencing numbness and tingling in bilateral hands and feet as well.  She will be started on Norvasc 5 mg po daily.  A prescription was sent to the pharmacy.    Paresthesias: She has been experiencing numbness and tingling in bilateral hands and feet.  She has had intermittent paresthesias over the past few years but her symptoms have been worsening and becoming more frequent.  A referral was placed to neurology for evaluation.   Primary osteoarthritis of both hands: She has PIP and DIP synovial thickening consistent with osteoarthritis of bilateral hands.  Joint protection and muscle strengthening were discussed.  She is been having increased discomfort and stiffness in bilateral hands.  No synovitis was noted.  Bilateral primary osteoarthritis of hip: She has very limited range of motion of bilateral hips.  She has discomfort external rotation.  She has tenderness of bilateral trochanteric  bursa.  Primary osteoarthritis of both knees: No warmth or effusion  of bilateral knee joints.    Primary osteoarthritis of both feet:  PIP and DIP synovial thickening consistent with osteoarthritis of bilateral feet.    Spondylosis of lumbar region without myelopathy or radiculopathy: No midline spinal tenderness.    Other medical conditions are listed as follows:   NAFLD (nonalcoholic fatty liver disease)  h/o Hiatal hernia  Essential hypertension  Vitamin D deficiency  Other fatigue  History of kidney stones  Primary insomnia    Orders: No orders of the defined types were placed in this encounter.  No orders of the defined types were placed in this encounter.   Face-to-face time spent with patient was 30 minutes. >50% of time was spent in counseling and coordination of care.  Follow-Up Instructions: Return for Pseudogout, Osteoarthritis.   Gearldine Bienenstock, PA-C  Note - This record has been created using Dragon software.  Chart creation errors have been sought, but may not always  have been located. Such creation errors do not reflect on  the standard of medical care.

## 2017-06-05 ENCOUNTER — Encounter: Payer: Self-pay | Admitting: Physician Assistant

## 2017-06-05 ENCOUNTER — Ambulatory Visit (INDEPENDENT_AMBULATORY_CARE_PROVIDER_SITE_OTHER): Payer: BLUE CROSS/BLUE SHIELD | Admitting: Physician Assistant

## 2017-06-05 VITALS — BP 141/81 | HR 76 | Resp 16 | Ht 65.0 in | Wt 182.0 lb

## 2017-06-05 DIAGNOSIS — K760 Fatty (change of) liver, not elsewhere classified: Secondary | ICD-10-CM | POA: Diagnosis not present

## 2017-06-05 DIAGNOSIS — Z87442 Personal history of urinary calculi: Secondary | ICD-10-CM

## 2017-06-05 DIAGNOSIS — M1189 Other specified crystal arthropathies, multiple sites: Secondary | ICD-10-CM | POA: Diagnosis not present

## 2017-06-05 DIAGNOSIS — M19041 Primary osteoarthritis, right hand: Secondary | ICD-10-CM | POA: Diagnosis not present

## 2017-06-05 DIAGNOSIS — K449 Diaphragmatic hernia without obstruction or gangrene: Secondary | ICD-10-CM | POA: Diagnosis not present

## 2017-06-05 DIAGNOSIS — L819 Disorder of pigmentation, unspecified: Secondary | ICD-10-CM

## 2017-06-05 DIAGNOSIS — I10 Essential (primary) hypertension: Secondary | ICD-10-CM

## 2017-06-05 DIAGNOSIS — E790 Hyperuricemia without signs of inflammatory arthritis and tophaceous disease: Secondary | ICD-10-CM

## 2017-06-05 DIAGNOSIS — E559 Vitamin D deficiency, unspecified: Secondary | ICD-10-CM

## 2017-06-05 DIAGNOSIS — M170 Bilateral primary osteoarthritis of knee: Secondary | ICD-10-CM | POA: Diagnosis not present

## 2017-06-05 DIAGNOSIS — R5383 Other fatigue: Secondary | ICD-10-CM

## 2017-06-05 DIAGNOSIS — M160 Bilateral primary osteoarthritis of hip: Secondary | ICD-10-CM

## 2017-06-05 DIAGNOSIS — R202 Paresthesia of skin: Secondary | ICD-10-CM

## 2017-06-05 DIAGNOSIS — M47816 Spondylosis without myelopathy or radiculopathy, lumbar region: Secondary | ICD-10-CM

## 2017-06-05 DIAGNOSIS — M19071 Primary osteoarthritis, right ankle and foot: Secondary | ICD-10-CM | POA: Diagnosis not present

## 2017-06-05 DIAGNOSIS — M19072 Primary osteoarthritis, left ankle and foot: Secondary | ICD-10-CM

## 2017-06-05 DIAGNOSIS — F5101 Primary insomnia: Secondary | ICD-10-CM

## 2017-06-05 DIAGNOSIS — M19042 Primary osteoarthritis, left hand: Secondary | ICD-10-CM

## 2017-06-05 MED ORDER — AMLODIPINE BESYLATE 5 MG PO TABS: 5.0000 mg | ORAL_TABLET | Freq: Every day | ORAL | 1 refills | Status: DC

## 2017-06-10 ENCOUNTER — Ambulatory Visit (INDEPENDENT_AMBULATORY_CARE_PROVIDER_SITE_OTHER): Payer: BLUE CROSS/BLUE SHIELD | Admitting: Family Medicine

## 2017-06-10 ENCOUNTER — Other Ambulatory Visit: Payer: BLUE CROSS/BLUE SHIELD

## 2017-06-10 ENCOUNTER — Encounter: Payer: Self-pay | Admitting: Family Medicine

## 2017-06-10 VITALS — BP 149/82 | HR 77 | Ht 65.0 in | Wt 181.7 lb

## 2017-06-10 DIAGNOSIS — E669 Obesity, unspecified: Secondary | ICD-10-CM

## 2017-06-10 DIAGNOSIS — Q625 Duplication of ureter: Secondary | ICD-10-CM | POA: Insufficient documentation

## 2017-06-10 DIAGNOSIS — R748 Abnormal levels of other serum enzymes: Secondary | ICD-10-CM | POA: Diagnosis not present

## 2017-06-10 DIAGNOSIS — Z79890 Hormone replacement therapy (postmenopausal): Secondary | ICD-10-CM

## 2017-06-10 DIAGNOSIS — R3915 Urgency of urination: Secondary | ICD-10-CM

## 2017-06-10 DIAGNOSIS — N200 Calculus of kidney: Secondary | ICD-10-CM | POA: Diagnosis not present

## 2017-06-10 DIAGNOSIS — I10 Essential (primary) hypertension: Secondary | ICD-10-CM | POA: Diagnosis not present

## 2017-06-10 DIAGNOSIS — R5383 Other fatigue: Secondary | ICD-10-CM | POA: Diagnosis not present

## 2017-06-10 DIAGNOSIS — Z79899 Other long term (current) drug therapy: Secondary | ICD-10-CM

## 2017-06-10 DIAGNOSIS — R945 Abnormal results of liver function studies: Secondary | ICD-10-CM | POA: Diagnosis not present

## 2017-06-10 DIAGNOSIS — R109 Unspecified abdominal pain: Secondary | ICD-10-CM | POA: Diagnosis not present

## 2017-06-10 DIAGNOSIS — E559 Vitamin D deficiency, unspecified: Secondary | ICD-10-CM

## 2017-06-10 DIAGNOSIS — E038 Other specified hypothyroidism: Secondary | ICD-10-CM

## 2017-06-10 DIAGNOSIS — R7989 Other specified abnormal findings of blood chemistry: Secondary | ICD-10-CM

## 2017-06-10 DIAGNOSIS — E7889 Other lipoprotein metabolism disorders: Secondary | ICD-10-CM

## 2017-06-10 DIAGNOSIS — M159 Polyosteoarthritis, unspecified: Secondary | ICD-10-CM

## 2017-06-10 DIAGNOSIS — E039 Hypothyroidism, unspecified: Secondary | ICD-10-CM

## 2017-06-10 LAB — POCT URINALYSIS DIPSTICK
Bilirubin, UA: NEGATIVE
GLUCOSE UA: NEGATIVE
KETONES UA: NEGATIVE
Leukocytes, UA: NEGATIVE
NITRITE UA: NEGATIVE
PROTEIN UA: NEGATIVE
SPEC GRAV UA: 1.025 (ref 1.010–1.025)
Urobilinogen, UA: 0.2 E.U./dL
pH, UA: 6 (ref 5.0–8.0)

## 2017-06-10 NOTE — Progress Notes (Signed)
Impression and Recommendations:    1. Flank pain   2. Urinary urgency   3. Kidney stones   4. 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 Flank pain/urinary urgency/Kidney stones/Ureter double on L -UA negative in office today.  -If you develop fever, chills, nausea, vomiting, worsening abdominal pain, contact the office.  -push fluids.     Education and routine counseling performed. Handouts provided.  Orders Placed This Encounter  Procedures  . POCT urinalysis dipstick    No orders of the defined types were placed in this encounter.   The patient was counseled, risk factors were discussed, anticipatory guidance given.  Gross side effects, risk and benefits, and alternatives of medications discussed with patient.  Patient is aware that all medications have potential side effects and we are unable to predict every side effect or drug-drug interaction that may occur.  Expresses verbal understanding and consents to current therapy plan and treatment regimen.   Return if symptoms worsen or fail to improve.    This document serves as a record of services personally performed by Thomasene Lot, DO. It was created on her behalf by Thelma Barge, a trained medical scribe. The creation of this record is based on the scribe's personal observations and the provider's statements to them.   I have reviewed the above medical documentation for accuracy and completeness and I concur.  Thomasene Lot 06/14/17 9:58 PM    Subjective:    HPI: Marissa Larson is a 62 y.o. female who presents to [facility] today for dysuria.  She states she has been "run down for weeks". She traveled a lot recently and has been stressed. She also states she had a "flare up" as a result of this.   Sx for 5 days.     She has a PSHx of ureterectomy and per pt has "a double ureter."  C/O: Dysuria, L flank pain, increased  urgency, and cloudy urine.  She has been pushing fluids.   Denies: Fever, chills, increased frequency beyond baseline, hematuria. She further denies any sex.   She denies vaginal discharge or rash.  She has a h/o cysts on her ovaries before.    Urinalysis    Component Value Date/Time   COLORURINE YELLOW 02/16/2017 0908   APPEARANCEUR CLEAR 02/16/2017 0908   LABSPEC 1.012 02/16/2017 0908   PHURINE 7.0 02/16/2017 0908   GLUCOSEU NEGATIVE 02/16/2017 0908   HGBUR NEGATIVE 02/16/2017 0908   BILIRUBINUR negative 06/10/2017 1426   KETONESUR NEGATIVE 02/16/2017 0908   PROTEINUR Negative 06/10/2017 1426   PROTEINUR NEGATIVE 02/16/2017 0908   UROBILINOGEN 0.2 06/10/2017 1426   NITRITE negative 06/10/2017 1426   NITRITE NEGATIVE 02/16/2017 0908   LEUKOCYTESUR Negative 06/10/2017 1426    Wt Readings from Last 3 Encounters:  06/10/17 181 lb 11.2 oz (82.4 kg)  06/05/17 182 lb (82.6 kg)  03/13/17 182 lb (82.6 kg)   BP Readings from Last 3 Encounters:  06/10/17 (!) 149/82  06/05/17 (!) 141/81  03/13/17 139/85   Pulse Readings from Last 3 Encounters:  06/10/17 77  06/05/17 76  03/13/17 73   BMI Readings from Last 3 Encounters:  06/10/17 30.24 kg/m  06/05/17 30.29 kg/m  03/13/17 29.38 kg/m     Patient Active Problem List   Diagnosis Date Noted  . NAFLD (nonalcoholic fatty liver disease) 40/98/1191    Priority: High  .  Elevated LFTs 11/11/2015    Priority: High  . Adjustment disorder with mixed anxiety and depressed mood 11/11/2015    Priority: High  . Pseudogout involving multiple joints 10/14/2015    Priority: High  . HTN (hypertension) 10/14/2015    Priority: High  . Elevated HDL 11/11/2015    Priority: Medium  . Hypothyroidism 10/14/2015    Priority: Medium  . Generalized OA 10/14/2015    Priority: Medium  . 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 Hyperuricemia 04/11/2016    Priority: Low  . Primary insomnia 04/11/2016    Priority:  Low  . Vitamin D deficiency 10/14/2015    Priority: Low  . Fatigue 10/14/2015    Priority: Low  . Sleep difficulties 10/14/2015    Priority: Low  . 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 Obesity, Class I, BMI 30-34.9 12/03/2016  . Kidney stones 09/12/2016  . Nausea 09/12/2016  . High risk medication use 07/30/2016  . Primary osteoarthritis of both feet 04/12/2016  . History of kidney stones 04/12/2016  . Primary osteoarthritis of both hands 04/11/2016  . Bilateral primary osteoarthritis of hip 04/11/2016  . Primary osteoarthritis of both knees 04/11/2016  . Spondylosis of lumbar region without myelopathy or radiculopathy 04/11/2016  . Flank pain 04/02/2016  . Sinusitis 04/02/2016  . Dysuria 11/11/2015  . Hormone replacement therapy- per GYN 10/14/2015  . h/o Hiatal hernia 10/14/2015  . Counseling on health promotion and disease prevention 10/14/2015  . Age-related nuclear cataract of both eyes 01/25/2015  . Posterior vitreous detachment of left eye 01/25/2015  . chronic Palpitations 07/19/2013    Past Surgical History:  Procedure Laterality Date  . ABDOMINAL SURGERY  1994  . FOOT SURGERY Right 2009  . KIDNEY SURGERY Left 1993  . KNEE SURGERY     Multiple knee surgeries  . OOPHORECTOMY Left 1994  . URETEROSCOPY VIA URETEROSTOMY Left 1994    Family History  Problem Relation Age of Onset  . Migraines Other        Fam Hx   . Heart disease Other        Fam Hx  . Thyroid disease Other        Fam Hx - She also has thyroid disease  . Diabetes Other        Fam Hx  . Arthritis Other        Fam Hx  . Gallbladder disease Other        Fam Hx  . Cancer Other        Fam Hx - Pancreatic  . Hypertension Other        Fam Hx  . Heart attack Cousin        Multiple cousins with MI's before 42 years of age  . Heart disease Mother 67  . Hyperlipidemia Mother 37  . Hypertension  Mother 84  . Stroke Mother 43  . Heart disease Maternal Grandmother   . Cancer Father 51       pancreatic  . Diabetes Brother 50  . Heart attack Maternal Uncle   . Heart attack Paternal Aunt     Social History   Substance and Sexual Activity  Drug Use No  ,  Social History   Substance and Sexual Activity  Alcohol Use No  . Frequency: Never   Comment: rarely   ,  Social History  Tobacco Use  Smoking Status Never Smoker  Smokeless Tobacco Never Used  ,  Social History   Substance and Sexual Activity  Sexual Activity Yes  . Birth control/protection: None    Patient's Medications  New Prescriptions   No medications on file  Previous Medications   AMLODIPINE (NORVASC) 5 MG TABLET    Take 1 tablet (5 mg total) by mouth daily.   ASCORBIC ACID (VITAMIN C) 1000 MG TABLET    Take 1,000 mg by mouth daily.   COLCRYS 0.6 MG TABLET    TAKE 1 TABLET BY MOUTH 2 TIMES DAILY.   ESCITALOPRAM (LEXAPRO) 10 MG TABLET    Take 10 mg by mouth daily.   ESTRADIOL-ESTRIOL-PROGESTERONE (BIEST/PROGESTERONE) CREA    Place onto the skin daily.   ESZOPICLONE (LUNESTA) 1 MG TABS TABLET    Take 1 tablet by mouth at bedtime as needed.   FAMOTIDINE (PEPCID) 40 MG TABLET    Take 40 mg by mouth.   FLAXSEED, LINSEED, (FLAXSEED OIL PO)    Take 3 capsules by mouth daily.   HYDROCHLOROTHIAZIDE (MICROZIDE) 12.5 MG CAPSULE    Take 1 capsule by mouth daily.   LEVOTHYROXINE (SYNTHROID, LEVOTHROID) 112 MCG TABLET    Take 112 mcg by mouth daily before breakfast.   MAGNESIUM GLUCONATE (MAGONATE) 500 MG TABLET    Take 1,000 mg by mouth daily.   MISC NATURAL PRODUCTS (TART CHERRY ADVANCED PO)    Take by mouth daily.   OMEGA-3 FATTY ACIDS (FISH OIL PO)    Take 2 capsules by mouth daily.   PROGESTERONE MICRONIZED (PROGESTERONE PO)    Take 150 mg by mouth daily.   TURMERIC PO    Take 100 mg by mouth daily.   VITAMIN D, ERGOCALCIFEROL, (DRISDOL) 50000 UNITS CAPS CAPSULE    TAKE 1 CAPSULE BY MOUTH EVERY 7 DAYS.    Modified Medications   No medications on file  Discontinued Medications   No medications on file    Advil [ibuprofen]; Penicillins; and Sulfa antibiotics  Current Meds  Medication Sig  . amLODipine (NORVASC) 5 MG tablet Take 1 tablet (5 mg total) by mouth daily.  . Ascorbic Acid (VITAMIN C) 1000 MG tablet Take 1,000 mg by mouth daily.  Marland Kitchen COLCRYS 0.6 MG tablet TAKE 1 TABLET BY MOUTH 2 TIMES DAILY.  Marland Kitchen escitalopram (LEXAPRO) 10 MG tablet Take 10 mg by mouth daily.  . Estradiol-Estriol-Progesterone (BIEST/PROGESTERONE) CREA Place onto the skin daily.  . eszopiclone (LUNESTA) 1 MG TABS tablet Take 1 tablet by mouth at bedtime as needed.  . Flaxseed, Linseed, (FLAXSEED OIL PO) Take 3 capsules by mouth daily.  . hydrochlorothiazide (MICROZIDE) 12.5 MG capsule Take 1 capsule by mouth daily.  Marland Kitchen levothyroxine (SYNTHROID, LEVOTHROID) 112 MCG tablet Take 112 mcg by mouth daily before breakfast.  . magnesium gluconate (MAGONATE) 500 MG tablet Take 1,000 mg by mouth daily.  . Misc Natural Products (TART CHERRY ADVANCED PO) Take by mouth daily.  . Omega-3 Fatty Acids (FISH OIL PO) Take 2 capsules by mouth daily.  . Progesterone Micronized (PROGESTERONE PO) Take 150 mg by mouth daily.  . TURMERIC PO Take 100 mg by mouth daily.  . Vitamin D, Ergocalciferol, (DRISDOL) 50000 units CAPS capsule TAKE 1 CAPSULE BY MOUTH EVERY 7 DAYS.    Review of Systems: General:   No F/C, wt loss Pulm:   No DIB, pleuritic chest pain Card:  No CP, palpitations Abd:  No n/v/d or pain GU:  Dysuria, increased frequency and urgency; no  vaginal discharge Ext:  No inc edema from baseline   Objective:  Blood pressure (!) 149/82, pulse 77, height 5\' 5"  (1.651 m), weight 181 lb 11.2 oz (82.4 kg), SpO2 98 %. Body mass index is 30.24 kg/m.  General: Well Developed, well nourished, and in no acute distress.  HEENT: Normocephalic, atraumatic Skin: Warm and dry, cap RF less 2 sec, good turgor CV: +S1, S2 Respiratory: ECTA  B/L; speaking in full sentences, no conversational dyspnea Abd: Soft, NT, ND, No G/R/R, no SPT, No flank pain NeuroM-Sk: Ambulates w/o assistance, moves * 4 Psych: A and O *3

## 2017-06-10 NOTE — Patient Instructions (Signed)
Patient has follow-up in the very near future for chronic care.  She will make an extended time as she requested this today and has "a lot to talk to me about".

## 2017-06-11 LAB — CBC WITH DIFFERENTIAL/PLATELET
BASOS ABS: 0.1 10*3/uL (ref 0.0–0.2)
Basos: 1 %
EOS (ABSOLUTE): 0.1 10*3/uL (ref 0.0–0.4)
Eos: 2 %
HEMOGLOBIN: 14.7 g/dL (ref 11.1–15.9)
Hematocrit: 43.1 % (ref 34.0–46.6)
IMMATURE GRANS (ABS): 0 10*3/uL (ref 0.0–0.1)
Immature Granulocytes: 0 %
LYMPHS ABS: 3 10*3/uL (ref 0.7–3.1)
LYMPHS: 44 %
MCH: 31.7 pg (ref 26.6–33.0)
MCHC: 34.1 g/dL (ref 31.5–35.7)
MCV: 93 fL (ref 79–97)
MONOCYTES: 5 %
Monocytes Absolute: 0.4 10*3/uL (ref 0.1–0.9)
NEUTROS ABS: 3.3 10*3/uL (ref 1.4–7.0)
Neutrophils: 48 %
PLATELETS: 238 10*3/uL (ref 150–450)
RBC: 4.63 x10E6/uL (ref 3.77–5.28)
RDW: 13.7 % (ref 12.3–15.4)
WBC: 6.9 10*3/uL (ref 3.4–10.8)

## 2017-06-11 LAB — COMPREHENSIVE METABOLIC PANEL
ALK PHOS: 78 IU/L (ref 39–117)
ALT: 41 IU/L — AB (ref 0–32)
AST: 34 IU/L (ref 0–40)
Albumin/Globulin Ratio: 2.3 — ABNORMAL HIGH (ref 1.2–2.2)
Albumin: 4.6 g/dL (ref 3.5–5.5)
BILIRUBIN TOTAL: 0.4 mg/dL (ref 0.0–1.2)
BUN/Creatinine Ratio: 30 — ABNORMAL HIGH (ref 9–23)
BUN: 16 mg/dL (ref 6–24)
CHLORIDE: 101 mmol/L (ref 96–106)
CO2: 25 mmol/L (ref 20–29)
CREATININE: 0.53 mg/dL — AB (ref 0.57–1.00)
Calcium: 9.5 mg/dL (ref 8.7–10.2)
GFR calc Af Amer: 120 mL/min/{1.73_m2} (ref >59)
GFR calc non Af Amer: 104 mL/min/{1.73_m2} (ref >59)
GLUCOSE: 100 mg/dL — AB (ref 65–99)
Globulin, Total: 2 g/dL (ref 1.5–4.5)
Potassium: 3.8 mmol/L (ref 3.5–5.2)
Sodium: 143 mmol/L (ref 134–144)
Total Protein: 6.6 g/dL (ref 6.0–8.5)

## 2017-06-11 LAB — VITAMIN B12: VITAMIN B 12: 431 pg/mL (ref 232–1245)

## 2017-06-11 LAB — HEMOGLOBIN A1C
ESTIMATED AVERAGE GLUCOSE: 117 mg/dL
Hgb A1c MFr Bld: 5.7 % — ABNORMAL HIGH (ref 4.8–5.6)

## 2017-06-11 LAB — LIPID PANEL
CHOLESTEROL TOTAL: 216 mg/dL — AB (ref 100–199)
Chol/HDL Ratio: 2.1 ratio (ref 0.0–4.4)
HDL: 102 mg/dL (ref >39)
LDL CALC: 101 mg/dL — AB (ref 0–99)
TRIGLYCERIDES: 66 mg/dL (ref 0–149)
VLDL Cholesterol Cal: 13 mg/dL (ref 5–40)

## 2017-06-11 LAB — T4, FREE: Free T4: 1.66 ng/dL (ref 0.82–1.77)

## 2017-06-11 LAB — INSULIN, RANDOM: INSULIN: 16.2 u[IU]/mL (ref 2.6–24.9)

## 2017-06-11 LAB — T3, FREE: T3 FREE: 2.9 pg/mL (ref 2.0–4.4)

## 2017-06-11 LAB — VITAMIN D 25 HYDROXY (VIT D DEFICIENCY, FRACTURES): Vit D, 25-Hydroxy: 34.7 ng/mL (ref 30.0–100.0)

## 2017-06-11 LAB — TSH: TSH: 0.486 u[IU]/mL (ref 0.450–4.500)

## 2017-06-15 ENCOUNTER — Ambulatory Visit: Payer: BLUE CROSS/BLUE SHIELD | Admitting: Family Medicine

## 2017-06-16 ENCOUNTER — Encounter: Payer: Self-pay | Admitting: Family Medicine

## 2017-06-16 ENCOUNTER — Ambulatory Visit (INDEPENDENT_AMBULATORY_CARE_PROVIDER_SITE_OTHER): Payer: BLUE CROSS/BLUE SHIELD | Admitting: Family Medicine

## 2017-06-16 VITALS — BP 136/83 | HR 82 | Ht 65.0 in | Wt 181.0 lb

## 2017-06-16 DIAGNOSIS — R5383 Other fatigue: Secondary | ICD-10-CM | POA: Diagnosis not present

## 2017-06-16 DIAGNOSIS — E669 Obesity, unspecified: Secondary | ICD-10-CM

## 2017-06-16 DIAGNOSIS — E7889 Other lipoprotein metabolism disorders: Secondary | ICD-10-CM | POA: Diagnosis not present

## 2017-06-16 DIAGNOSIS — Z79890 Hormone replacement therapy (postmenopausal): Secondary | ICD-10-CM | POA: Diagnosis not present

## 2017-06-16 DIAGNOSIS — R7989 Other specified abnormal findings of blood chemistry: Secondary | ICD-10-CM

## 2017-06-16 DIAGNOSIS — F5101 Primary insomnia: Secondary | ICD-10-CM

## 2017-06-16 DIAGNOSIS — E559 Vitamin D deficiency, unspecified: Secondary | ICD-10-CM | POA: Diagnosis not present

## 2017-06-16 DIAGNOSIS — Z7189 Other specified counseling: Secondary | ICD-10-CM

## 2017-06-16 DIAGNOSIS — F4323 Adjustment disorder with mixed anxiety and depressed mood: Secondary | ICD-10-CM | POA: Diagnosis not present

## 2017-06-16 DIAGNOSIS — E66811 Obesity, class 1: Secondary | ICD-10-CM

## 2017-06-16 DIAGNOSIS — G479 Sleep disorder, unspecified: Secondary | ICD-10-CM | POA: Diagnosis not present

## 2017-06-16 DIAGNOSIS — K760 Fatty (change of) liver, not elsewhere classified: Secondary | ICD-10-CM

## 2017-06-16 DIAGNOSIS — R945 Abnormal results of liver function studies: Secondary | ICD-10-CM | POA: Diagnosis not present

## 2017-06-16 DIAGNOSIS — M1189 Other specified crystal arthropathies, multiple sites: Secondary | ICD-10-CM | POA: Diagnosis not present

## 2017-06-16 MED ORDER — ESCITALOPRAM OXALATE 20 MG PO TABS: 20.0000 mg | ORAL_TABLET | Freq: Every day | ORAL | 1 refills | Status: DC

## 2017-06-16 NOTE — Progress Notes (Signed)
Impression and Recommendations:    1. Obesity, Class I, BMI 30-34.9   2. Hormone replacement therapy- per GYN   3. Counseling on health promotion and disease prevention   4. Sleep difficulties   5. Adjustment disorder with mixed anxiety and depressed mood   6. NAFLD (nonalcoholic fatty liver disease)   7. Elevated LFTs   8. Pseudogout involving multiple joints   9. Elevated HDL   10. Other fatigue   11. Primary insomnia   12. Vitamin D deficiency      Pt was in the office today for 32.5+ minutes, with over 50% time spent in face to face counseling of patients various medical conditions, treatment plans of those medical conditions including medicine management and lifestyle modification, strategies to improve health and well being; and in coordination of care. SEE ABOVE TREATMENT PLAN FOR DETAILS  Labs reviewed with the patient today.   1. Blood Pressure/Vascular Concerns/Raynaud's - Advised patient to start half tablet of amlodipine daily (per Dr. Corliss Skainseveshwar). - Discussed goal BP of less than 130/80. - Will continue to monitor changes with blood pressure; advised patient to follow up with Dr. Corliss Skainseveshwar about continued vascular/Reynaud's concerns.  - Advised patient to monitor changes while taking amlodipine, and provide this information for her specialists.  Emphasized the importance of informing future specialists briefly about her medical journey to this point.  - Advised patient about potential risks, benefits, and S-E of beginning amlodipine, including increased LE swelling.  - Relevant lifestyle changes such as continued regular exercise program discussed with patient.   - Ambulatory BP monitoring encouraged.  Monitor for alarming symptoms such as dizziness or lightheadedness, and call into the clinic ASAP if needed.  2. Fatigue/Mood - Increased Lexapro from 10mg  to 20mg , once daily.  Will continue to monitor. - Patient encouraged to call to make an appointment with  therapy today during her lunch break.  - Advised patient that emotional stress can cause fatigue and feelings of being run-down or overwhelmed.  Encouraged patient to try to avoid sources of stress if at all possible.  - The "spokes of the wheel" of mood/stress management discussed with the patient today.  Encouraged patient to exercise regularly, continue on appropriate medications, seek counseling/life coaches, engage in meditation, consume a nutritious diet, obtain adequate sleep, and seek healthy support networks socially.  - Reviewed and demonstrated Cook's Hook-up and Square Breathing techniques with the patient in office today.  Encouraged patient to perform these exercises 5+ times per day.  - Advised patient to quiet her brain by focusing more intently on managing her general health maintenance, less about the potential diagnoses she could find.  3. Sleep Concerns/Insomnia - Keep Lunesta at 1 mg.  Will continue to monitor use. - Encouraged patient to use pharmacological sleep aids sparingly.  - Heathy sleep hygiene discussed at length with the patient.  Handout provided today.  - Advised patient to listen to meditative sleep aids on youtube to help her to enhance relaxation and help sleep at night.  Encouraged patient to use sound machines or sleep assistance in this fashion instead of medication.  4. BMI Counseling Explained to patient what BMI refers to, and what it means medically.    Told patient to think about it as a "medical risk stratification measurement" and how increasing BMI is associated with increasing risk/ or worsening state of various diseases such as hypertension, hyperlipidemia, diabetes, premature OA, depression etc.  American Heart Association guidelines for healthy diet, basically  Mediterranean diet, and exercise guidelines of 30 minutes 5 days per week or more discussed in detail.  Health counseling performed.  All questions answered.  Dietary & Exercise  Habits - Advised patient to continue working toward exercising to improve health - discussed that increased exercise will help improve patient's mood and fatigue, as well as help reduce her A1c (recently prediabetes).  - Patient should resume with at least 30 minutes of activity daily.  Recommended that the patient eventually strive for at least 150 minutes of moderate cardiovascular activity per week according to guidelines established by the [facility].   - Healthy dietary habits encouraged, including low-carb, high antioxidant, and high amounts of lean protein in diet.   - Handout provided today on pre-DM eating plan.  - Patient should also consume adequate amounts of water - half of body weight in oz of water per day  5. Follow-Up - Continue to follow up with specialists.  - Return for follow-up in 6-8 weeks for mood and chronic health update.   Meds ordered this encounter  Medications  . DISCONTD: escitalopram (LEXAPRO) 20 MG tablet    Sig: Take 1 tablet (20 mg total) by mouth daily.    Dispense:  90 tablet    Refill:  1  . escitalopram (LEXAPRO) 20 MG tablet    Sig: Take 1 tablet (20 mg total) by mouth daily.    Dispense:  90 tablet    Refill:  1    Gross side effects, risk and benefits, and alternatives of medications and treatment plan in general discussed with patient.  Patient is aware that all medications have potential side effects and we are unable to predict every side effect or drug-drug interaction that may occur.   Patient will call with any questions prior to using medication if they have concerns.  Expresses verbal understanding and consents to current therapy and treatment regimen.  No barriers to understanding were identified.  Red flag symptoms and signs discussed in detail.  Patient expressed understanding regarding what to do in case of emergency\urgent symptoms  Please see AVS handed out to patient at the end of our visit for further patient instructions/ counseling  done pertaining to today's office visit.   Return for 6 wks- f/up .    Note: This note was prepared with assistance of Dragon voice recognition software. Occasional wrong-word or sound-a-like substitutions may have occurred due to the inherent limitations of voice recognition software.   I have reviewed the above documentation for accuracy and completeness, and I agree with the above.   This document serves as a record of services personally performed by Thomasene Lot, DO. It was created on her behalf by Peggye Fothergill, a trained medical scribe. The creation of this record is based on the scribe's personal observations and the provider's statements to them.   I have reviewed the above medical documentation for accuracy and completeness and I concur.  Thomasene Lot 06/17/17 8:40 PM   ------------------------------------------------------------------------------------------------------------------------------   Subjective:     HPI: Marissa Larson is a 62 y.o. female who presents to [facility] today for issues as discussed below.  Patient is here today to discuss several concerns. Notes a variety of "weird things" going on in her life.  Patient does have family history of diabetes; her brother has DM, and her sister is pre-diabetic.  NAFLD Diagnosis Was recently diagnosed with non-alcoholic fatty liver disease (late 2018); states that a liver scan was performed with Dr.  Medoff which revealed severe scarring.  Patient notes that Dr. Kinnie Scales has been a wonderful provider.   After this, patient cut out wine and alcohol and started her exercise journey.  Stated she had to "stop everything" with regards to her medications.  Notes discontinued allopurinol, ibuprofen; remarks "it was awful," and that she felt like an 62 year old lady.  Then she finally started feeling better; notes "I felt great, I felt terrific."  Despite the diet beginning during the  holidays, she did her best.  Around Christmas, patient began using her fitbit; tracked her progress walking.  At that time she was working 30 minutes twice daily, and experienced a total weight loss of 23 lbs.  In February, had more follow-up with Dr. Kinnie Scales and test results; her liver enzymes were improved.  Dr. Kinnie Scales advised her that her weight was likely the highest impact on her liver.   At that time, notes that she was told she could drink alcohol at special occassions.  She continues to avoid medications that affect her liver.  Her goal now is to lose another 20 lbs.  Resumed exercising earlier this week.  Per Patient - Chronic "Blue Toes" Started having issues with "blue toes, purple toes."  States that she's always had this, but it got so bad and painful she couldn't walk.  At that time, her husband advised her to get it checked out.  Went in to see a vascular specialist - Dr. Randie Heinz. Did testing to rule things out, including abdominal CT. Ruled out vasculitis, ruled out throwing clots; advised her to visit rheumatology and dermatology.  Patient followed up with Dr. Corliss Skains, and was sent to dermatology after this.  Derm told her that she may be experiencing a reaction of the vascular system to cold and dampness. They told her that there's nothing she can do for this aside from keeping her feet warm.  Raynaud's Diagnosis Patient returned to rheumatology and Dr. Corliss Skains ran blood work on vasculitis; Dr. Corliss Skains stated that she thinks it's Raynaud's syndrome.  Patient was prescribed a blood pressure medication to try (amlodipine).  Patient has not started it yet.  Patient was also referred to neurology by Dr. Corliss Skains, and wishes to wait until this visit.  This appointment was scheduled because the patient has noticed recently that her feet and hands go numb, and sometimes at the same time, her neck and part of her face start to go numb.  This has become more frequent  lately.  Fatigue/Mood Patient has been feeling sick, run down, and tired recently.  Describes this as "severe fatigue."  Notes "sometimes having to go rest at lunch time."  Remarks that she's also not sleeping well again.  Patient is taking Lexapro; notes "I like it, it takes the edge off."  Acknowledges that her health concerns have been adding to her worry and anxiety.  Notes that her husband is a great support system, though she does experience stress at work, home, with friends, and with family.  She is not seeing a counselor at this time.  Patient states that meditation has helped alleviate stress in the past.  Sleep Concerns/Insomnia Was taking 10 mg of melatonin nightly.  In addition, patient was taking magnesium and lavender oil, as well as a sleep aid with tryptophan.  Patient tries not to take Alfonso Patten unless she absolutely needs it; however, she has been taking two Lunesta lately to help her sleep.  Notes that she can't go to sleep at night  because "I can't shut off the monkey mind."  Notes that sometimes she will work until 7-8 PM at night, and used to go outside, chill, and have a glass of wine.  Can no longer drink alcohol due to NAFLD.  Patient tried amitriptyline but it "lasted too long for her."  Patient quit taking melatonin and everything and started taking Lunesta again.  Notes that she's taking two Lunesta to get to sleep, but tries to avoid taking this unless she's "bouncing off the walls."   Wt Readings from Last 3 Encounters:  06/16/17 181 lb (82.1 kg)  06/10/17 181 lb 11.2 oz (82.4 kg)  06/05/17 182 lb (82.6 kg)   BP Readings from Last 3 Encounters:  06/16/17 136/83  06/10/17 (!) 149/82  06/05/17 (!) 141/81   Pulse Readings from Last 3 Encounters:  06/16/17 82  06/10/17 77  06/05/17 76   BMI Readings from Last 3 Encounters:  06/16/17 30.12 kg/m  06/10/17 30.24 kg/m  06/05/17 30.29 kg/m     Patient Care Team    Relationship Specialty  Notifications Start End  Thomasene Lot, DO PCP - General Family Medicine  10/09/15   Marinus Maw, MD Consulting Physician Cardiology  10/09/15   Salvatore Marvel, MD Consulting Physician Orthopedic Surgery  10/09/15   Pollyann Savoy, MD Consulting Physician Rheumatology  10/09/15   Altheimer, Casimiro Needle, MD Consulting Physician Endocrinology  10/09/15   Sharrell Ku, MD Consulting Physician Gastroenterology  10/09/15   Freddy Finner, MD Consulting Physician Obstetrics and Gynecology  03/13/17   Rene Paci, MD Consulting Physician Urology  03/13/17      Patient Active Problem List   Diagnosis Date Noted  . NAFLD (nonalcoholic fatty liver disease) 40/98/1191    Priority: High  . Elevated LFTs 11/11/2015    Priority: High  . Adjustment disorder with mixed anxiety and depressed mood 11/11/2015    Priority: High  . Pseudogout involving multiple joints 10/14/2015    Priority: High  . HTN (hypertension) 10/14/2015    Priority: High  . Elevated HDL 11/11/2015    Priority: Medium  . Hypothyroidism 10/14/2015    Priority: Medium  . Generalized OA 10/14/2015    Priority: Medium  . 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 Hyperuricemia 04/11/2016    Priority: Low  . Primary insomnia 04/11/2016    Priority: Low  . Vitamin D deficiency 10/14/2015    Priority: Low  . Fatigue 10/14/2015    Priority: Low  . Sleep difficulties 10/14/2015    Priority: Low  . 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 Obesity, Class I, BMI 30-34.9 12/03/2016  . Kidney stones 09/12/2016  . Nausea 09/12/2016  . High risk medication use 07/30/2016  . Primary osteoarthritis of both feet 04/12/2016  . History of kidney stones 04/12/2016  . Primary osteoarthritis of both hands 04/11/2016  . Bilateral primary osteoarthritis of hip 04/11/2016  . Primary osteoarthritis of both knees  04/11/2016  . Spondylosis of lumbar region without myelopathy or radiculopathy 04/11/2016  . Flank pain 04/02/2016  . Sinusitis 04/02/2016  . Dysuria 11/11/2015  . Hormone replacement therapy- per GYN 10/14/2015  . h/o Hiatal hernia 10/14/2015  . Counseling on health promotion and disease prevention 10/14/2015  . Age-related nuclear cataract of both eyes 01/25/2015  . Posterior vitreous detachment of left eye 01/25/2015  . chronic Palpitations 07/19/2013  Past Medical history, Surgical history, Family history, Social history, Allergies and Medications have been entered into the medical record, reviewed and changed as needed.    Current Meds  Medication Sig  . amLODipine (NORVASC) 5 MG tablet Take 1 tablet (5 mg total) by mouth daily.  . Ascorbic Acid (VITAMIN C) 1000 MG tablet Take 1,000 mg by mouth daily.  Marland Kitchen COLCRYS 0.6 MG tablet TAKE 1 TABLET BY MOUTH 2 TIMES DAILY.  Marland Kitchen escitalopram (LEXAPRO) 20 MG tablet Take 1 tablet (20 mg total) by mouth daily.  . Estradiol-Estriol-Progesterone (BIEST/PROGESTERONE) CREA Place onto the skin daily.  . eszopiclone (LUNESTA) 1 MG TABS tablet Take 1 tablet by mouth at bedtime as needed.  . Flaxseed, Linseed, (FLAXSEED OIL PO) Take 3 capsules by mouth daily.  . hydrochlorothiazide (MICROZIDE) 12.5 MG capsule Take 1 capsule by mouth daily.  Marland Kitchen levothyroxine (SYNTHROID, LEVOTHROID) 112 MCG tablet Take 112 mcg by mouth daily before breakfast.  . magnesium gluconate (MAGONATE) 500 MG tablet Take 1,000 mg by mouth daily.  . Misc Natural Products (TART CHERRY ADVANCED PO) Take by mouth daily.  . Omega-3 Fatty Acids (FISH OIL PO) Take 2 capsules by mouth daily.  . Progesterone Micronized (PROGESTERONE PO) Take 150 mg by mouth daily.  . TURMERIC PO Take 100 mg by mouth daily.  . Vitamin D, Ergocalciferol, (DRISDOL) 50000 units CAPS capsule TAKE 1 CAPSULE BY MOUTH EVERY 7 DAYS.  . [DISCONTINUED] escitalopram (LEXAPRO) 10 MG tablet Take 20 mg by mouth daily.   . [DISCONTINUED] escitalopram (LEXAPRO) 20 MG tablet Take 1 tablet (20 mg total) by mouth daily.    Allergies:  Allergies  Allergen Reactions  . Advil [Ibuprofen] Nausea Only    History of gastric ulcer with hemorrhage secondary to ibuprofen use  . Penicillins Hives and Itching  . Sulfa Antibiotics Hives and Itching    Bactrim    Review of Systems:  A fourteen system review of systems was performed and found to be positive as per HPI.   Objective:   Blood pressure 136/83, pulse 82, height 5\' 5"  (1.651 m), weight 181 lb (82.1 kg), SpO2 98 %. Body mass index is 30.12 kg/m. General:  Well Developed, well nourished, appropriate for stated age.  Neuro:  Alert and oriented,  extra-ocular muscles intact  HEENT:  Normocephalic, atraumatic, neck supple, no carotid bruits appreciated  Skin:  no gross rash, warm, pink. Cardiac:  RRR, S1 S2 Respiratory:  ECTA B/L and A/P, Not using accessory muscles, speaking in full sentences- unlabored. Vascular:  Ext warm, no cyanosis apprec.; cap RF less 2 sec. Psych:  No HI/SI, judgement and insight good, Euthymic mood. Full Affect. LE: Non-pitting edema, halfway up LE bilateral.

## 2017-06-16 NOTE — Patient Instructions (Addendum)
Please make sure you start the Norvasc\amlodipine per Dr. Tedra Coupe recommendations.  Okay to start at half a tab and see how the blood pressure is and its effect on your bilateral feet.  Make sure you follow-up with Dr. Latanya Maudlin as directed.     Behavioral Health/ Counseling Referrals    Dr. Marvene Staff, PHD Dr. Marvene Staff, PHD is a counselor in [place], [place].  [address] [place], [place] [address] Contact Information [phone]   Francee Nodal, [place]  [phone] [email or web address] ( she does [place] and faith-based coaching and counseling )   [employer]- ( faith-based counseling ) Address: [address] [place], [place] [address] [phone] Office [address] for appointments [phone] Fax Hours: Monday - Thursday 8:00am-6:00pm Closed for lunch 12-1Thursday only Friday: Closed all day   Danae Orleans: [phone] or Meg Swaziland- 336- [phone] -counselors in [place] who are faith based    [facility] Hurley Cisco, [place], ACSW, M.ED.  -Hurley Cisco is a licensed clinical social worker in practice over 35 years and with Dr. Milagros Evener for the last 10 years.  -She sees adults, adolescents, children & families and couples. -Services are provided for mood and anxiety disorders, marital issues, family or parent/child problems, parenting, co-dependency, gender issues, trauma, grief, and stages of life issues. She also provides critical incident stress debriefing.  -Britta Mccreedy accepts many employee assistance programs (EAP), [employer], Chiropractor.  PHONE  [phone]                FAX [phone]   Bethann Berkshire [email or web address] [employer]- gen counseling;  PHD   Corine Shelter, MSW [address] [place] [phone]    Behavioral Medicine Caralyn Guile, PhD [address],  [place] Otto [phone]   [facility]- children [address], [address] [place], [place] [phone]   Heloise Beecham Professional Counselor Counseling and [employer] [phone]   [facility] abuse [facility] Manager [address], [place] [phone]   [facility] [address], [place] [phone] Eliott Nine, PhD [place] Scrape, PhD Hollace Kinnier, LCSW Andrena Mews, PhD-child, adolescent and adults   [facility] [address], [place] [phone] Daun Peacock, MS-child, adolescent and adults Madelaine Etienne, PhD-adolescent and adults   [facility]-grief, terminal illness [address] [place], [place] [phone]   [facility] [address], [place] [phone] [employer] [address] [place] [phone]   [facility] [address] [place] [phone]   [facility] [address] [place] [phone]   [facility] [address], Pura Spice [phone]   [facility] [address], [place] [phone]   [facility] [address], [place] [phone]   Risk factors for prediabetes and type 2 diabetes  Researchers don't fully understand why some people develop prediabetes and type 2 diabetes and others don't.  It's clear that certain factors increase the risk, however, including:  Weight. The more fatty tissue you have, the more resistant your cells become to insulin.  Inactivity. The less active you are, the greater your risk. Physical activity helps you control your weight, uses up glucose as energy and makes your cells more sensitive to  insulin.  Family history. Your risk increases if a parent or sibling has type 2 diabetes.  Race. Although it's unclear why, people of certain races -- including blacks, Hispanics, American Indians and Asian-Americans -- are at higher risk.  Age. Your risk increases as you get older. This may be because you tend to exercise less, lose muscle mass and gain weight as you age. But type 2 diabetes is also increasing dramatically among children, adolescents and younger adults.  Gestational diabetes. If you developed gestational diabetes when you were pregnant, your risk of developing prediabetes and type 2 diabetes later increases. If you gave birth to a baby weighing more than 9 pounds (4 kilograms), you're also at risk of type 2 diabetes.  Polycystic ovary syndrome. For women, having polycystic ovary syndrome -- a common condition characterized by irregular menstrual periods, excess hair growth and obesity -- increases the risk of diabetes.  High blood pressure. Having blood pressure over 140/90 millimeters of mercury (mm Hg) is linked to an increased risk of type 2 diabetes.  Abnormal cholesterol and triglyceride levels. If you have low levels of high-density lipoprotein (HDL), or "good," cholesterol, your risk of type 2 diabetes is higher. Triglycerides are another type of fat carried in the blood. People with high levels of triglycerides have an increased risk of type 2 diabetes. Your doctor can let you know what your cholesterol and triglyceride levels are.  A good guide to good carbs: The glycemic index ---If you have diabetes, or at risk for diabetes, you know all too well that when you eat carbohydrates, your blood sugar goes up. The total amount of carbs you consume at a meal or in a snack mostly determines what your blood sugar will do. But the food itself also plays a role. A serving of white rice has almost the same effect as eating pure table sugar -- a quick, high spike in blood sugar. A serving  of lentils has a slower, smaller effect.  ---Picking good sources of carbs can help you control your blood sugar and your weight. Even if you don't have diabetes, eating healthier carbohydrate-rich foods can help ward off a host of chronic conditions, from heart disease to various cancers to, well, diabetes.  ---One way to choose foods is with the glycemic index (GI). This tool measures how much a food boosts blood sugar.  The glycemic index rates the effect of a specific amount of a food on blood sugar compared with the same amount of pure glucose. A food with a glycemic index of 28 boosts blood sugar only 28% as much as pure glucose. One with a GI of 95 acts like pure glucose.    High glycemic foods result in a quick spike in insulin and blood sugar (also known as blood glucose).  Low glycemic foods have a slower, smaller effect- these are healthier for you.   Using the glycemic index Using the glycemic index is easy: choose foods in the low GI category instead of those in the high GI category (see below), and go easy on those in between. Low glycemic index (GI of 55 or less): Most fruits and vegetables, beans, minimally processed grains, pasta, low-fat dairy foods, and nuts.  Moderate glycemic index (GI 56 to 69): White and sweet potatoes, corn, white rice, couscous, breakfast cereals such as Cream of Wheat and Mini Wheats.  High glycemic index (GI of 70 or higher): White bread, rice cakes, most crackers, bagels, cakes, doughnuts, croissants, most packaged breakfast cereals. You can see the values for 100 commons foods and get links to more at [email or web address].RecordDebt.hu.  Swaps for lowering glycemic index  Instead of this high-glycemic index food Eat this lower-glycemic index food  White rice Brown rice or converted rice  Instant oatmeal Steel-cut oats  Cornflakes Bran flakes  Baked potato Pasta, bulgur  White bread Whole-grain bread  Corn Peas or leafy greens     Prediabetes  Eating Plan  Prediabetes--also called impaired glucose tolerance or impaired fasting glucose--is a condition that causes blood sugar (blood glucose) levels to be higher than normal. Following a healthy diet can help to keep prediabetes under control. It can also help to lower the risk of type 2 diabetes and heart disease, which are increased in people who have prediabetes. Along with regular exercise, a healthy diet:  Promotes weight loss.  Helps to control blood sugar levels.  Helps to improve the way that the body uses insulin.   WHAT DO I NEED TO KNOW ABOUT THIS EATING PLAN?   Use the glycemic index (GI) to plan your meals. The index tells you how quickly a food will raise your blood sugar. Choose low-GI foods. These foods take a longer time to raise blood sugar.  Pay close attention to the amount of carbohydrates in the food that you eat. Carbohydrates increase blood sugar levels.  Keep track of how many calories you take in. Eating the right amount of calories will help you to achieve a healthy weight. Losing about 7 percent of your starting weight can help to prevent type 2 diabetes.  You may want to follow a Mediterranean diet. This diet includes a lot of vegetables, lean meats or fish, whole grains, fruits, and healthy oils and fats.   WHAT FOODS CAN I EAT?  Grains Whole grains, such as whole-wheat or whole-grain breads, crackers, cereals, and pasta. Unsweetened oatmeal. Bulgur. Barley. Quinoa. Brown rice. Corn or whole-wheat flour tortillas or taco shells. Vegetables Lettuce. Spinach. Peas. Beets. Cauliflower. Cabbage. Broccoli. Carrots. Tomatoes. Squash. Eggplant. Herbs. Peppers. Onions. Cucumbers. Brussels sprouts. Fruits Berries. Bananas. Apples. Oranges. Grapes. Papaya. Mango. Pomegranate. Kiwi. Grapefruit. Cherries. Meats and Other Protein Sources Seafood. Lean meats, such as chicken and [place] or lean cuts of pork and beef. Tofu. Eggs. Nuts. Beans. Dairy Low-fat or  fat-free dairy products, such as yogurt, cottage cheese, and cheese. Beverages Water. Tea. Coffee. Sugar-free or diet soda. Seltzer water. Milk. Milk alternatives, such as soy or almond milk. Condiments Mustard. Relish. Low-fat, low-sugar ketchup. Low-fat, low-sugar barbecue sauce. Low-fat or fat-free mayonnaise. Sweets and Desserts Sugar-free or low-fat pudding. Sugar-free or low-fat ice cream and other frozen treats. Fats and Oils Avocado. Walnuts. Olive oil. The items listed above may not be a complete list of recommended foods or beverages. Contact your dietitian for more options.    WHAT FOODS ARE NOT RECOMMENDED?  Grains Refined white flour and flour products, such as bread, pasta, snack foods, and cereals. Beverages Sweetened drinks, such as sweet iced tea and soda. Sweets and Desserts Baked goods, such as cake, cupcakes, pastries, cookies, and cheesecake. The items listed above may not be a complete list of foods and beverages to avoid. Contact your dietitian for more information.   This information is not intended to replace advice given to you by your health care provider. Make sure you discuss any questions you have with your health care provider.   Document Released: 05/09/2014 Document Reviewed: 05/09/2014 Elsevier Interactive Patient Education 2016 [employer].   Sleep Hygiene  If you have insomnia or difficulty sleeping, this information is for you:  - Avoid caffeinated beverages after  lunch,  no alcoholic beverages,  no eating within 2-3 hours of lying down,  avoid exposure to blue light before bed,  avoid daytime naps, and  needs to maintain a regular sleep schedule- go to sleep and wake up around the same time every night.   - Resolve concerns or worries before entering bedroom:  Discussed relaxation techniques with patient and to keep a journal to write down fears\ worries.  I suggested seeing a counselor for CBT.   - Recommend patient meditate or do deep  breathing exercises to help relax.   Incorporate the use of white noise machines or listen to "sleep meditation music", or recordings of guided meditations for sleep from YouTube which are free, such as  "guided meditation for detachment from over thinking"  by Ina KickMichael Sealey.

## 2017-06-25 ENCOUNTER — Encounter: Payer: Self-pay | Admitting: Podiatry

## 2017-06-25 ENCOUNTER — Ambulatory Visit (INDEPENDENT_AMBULATORY_CARE_PROVIDER_SITE_OTHER): Payer: BLUE CROSS/BLUE SHIELD | Admitting: Podiatry

## 2017-06-25 ENCOUNTER — Ambulatory Visit (INDEPENDENT_AMBULATORY_CARE_PROVIDER_SITE_OTHER): Payer: BLUE CROSS/BLUE SHIELD

## 2017-06-25 VITALS — BP 154/87 | HR 72 | Resp 16

## 2017-06-25 DIAGNOSIS — M779 Enthesopathy, unspecified: Secondary | ICD-10-CM

## 2017-06-25 DIAGNOSIS — Q828 Other specified congenital malformations of skin: Secondary | ICD-10-CM | POA: Diagnosis not present

## 2017-06-25 DIAGNOSIS — M722 Plantar fascial fibromatosis: Secondary | ICD-10-CM

## 2017-06-25 DIAGNOSIS — M778 Other enthesopathies, not elsewhere classified: Secondary | ICD-10-CM

## 2017-06-25 NOTE — Progress Notes (Signed)
Subjective:  Patient ID: Marissa Larson, female    DOB: March 14, 1955,  MRN: [id number] HPI Chief Complaint  Patient presents with  . Callouses    Sub 5th MPJ left - small, callused area x few months, tender walking, no treatment  . Foot Pain    Toes 4th and 5th right - interdigital corns x few years, toes 2 and 3 splay, reconstructive foot surgery x 10 years ago  . NOTE    Patient has been having purple toes and there is a question of whether she has raynaud's  . New Patient (Initial Visit)    62 y.o. female presents with the above complaint.  ROS:  ROS: Denies fever chills nausea vomiting muscle aches pains calf pain back pain chest pain shortness of breath and headache.  Past Medical History:  Diagnosis Date  . Arthritis   . Chest pain 06/2013   Left sided  . Chronic leg pain 06/2013  . Fatigue   . Nonproductive cough 06/2013  . Overweight 01/18/2014  . Sleep difficulties    Change in sleep patterns  . SOB (shortness of breath) 06/2013  . Tachycardia 06/2013  . Thyroid disease    hypo  . UTI (lower urinary tract infection)    Past Surgical History:  Procedure Laterality Date  . ABDOMINAL SURGERY  1994  . FOOT SURGERY Right 2009  . KIDNEY SURGERY Left 1993  . KNEE SURGERY     Multiple knee surgeries  . OOPHORECTOMY Left 1994  . URETEROSCOPY VIA URETEROSTOMY Left 1994    Current Outpatient Medications:  .  amLODipine (NORVASC) 5 MG tablet, Take 1 tablet (5 mg total) by mouth daily., Disp: 30 tablet, Rfl: 1 .  Ascorbic Acid (VITAMIN C) 1000 MG tablet, Take 1,000 mg by mouth daily., Disp: , Rfl:  .  COLCRYS 0.6 MG tablet, TAKE 1 TABLET BY MOUTH 2 TIMES DAILY., Disp: 180 tablet, Rfl: 1 .  escitalopram (LEXAPRO) 20 MG tablet, Take 1 tablet (20 mg total) by mouth daily., Disp: 90 tablet, Rfl: 1 .  Estradiol-Estriol-Progesterone (BIEST/PROGESTERONE) CREA, Place onto the skin daily., Disp: , Rfl:  .  eszopiclone (LUNESTA) 1 MG TABS tablet, Take 1 tablet by mouth at bedtime as  needed., Disp: , Rfl:  .  famotidine (PEPCID) 40 MG tablet, Take 40 mg by mouth., Disp: , Rfl:  .  famotidine (PEPCID) 40 MG tablet, , Disp: , Rfl: 2 .  Flaxseed, Linseed, (FLAXSEED OIL PO), Take 3 capsules by mouth daily., Disp: , Rfl:  .  hydrochlorothiazide (MICROZIDE) 12.5 MG capsule, Take 1 capsule by mouth daily., Disp: , Rfl:  .  levothyroxine (SYNTHROID, LEVOTHROID) 112 MCG tablet, Take 112 mcg by mouth daily before breakfast., Disp: , Rfl:  .  magnesium gluconate (MAGONATE) 500 MG tablet, Take 1,000 mg by mouth daily., Disp: , Rfl:  .  Misc Natural Products (TART CHERRY ADVANCED PO), Take by mouth daily., Disp: , Rfl:  .  Omega-3 Fatty Acids (FISH OIL PO), Take 2 capsules by mouth daily., Disp: , Rfl:  .  Progesterone Micronized (PROGESTERONE PO), Take 150 mg by mouth daily., Disp: , Rfl:  .  TURMERIC PO, Take 100 mg by mouth daily., Disp: , Rfl:  .  Vitamin D, Ergocalciferol, (DRISDOL) 50000 units CAPS capsule, TAKE 1 CAPSULE BY MOUTH EVERY 7 DAYS., Disp: 12 capsule, Rfl: 20  Allergies  Allergen Reactions  . Advil [Ibuprofen] Nausea Only    History of gastric ulcer with hemorrhage secondary to ibuprofen use  . Penicillins  Hives and Itching  . Sulfa Antibiotics Hives and Itching    Bactrim   Review of Systems Objective:   Vitals:   06/25/17 0950  BP: (!) 154/87  Pulse: 72  Resp: 16    General: Well developed, nourished, in no acute distress, alert and oriented x3   Dermatological: Skin is warm, dry and supple bilateral. Nails x 10 are well maintained; remaining integument appears unremarkable at this time. There are no open sores, no preulcerative lesions, no rash or signs of infection present.  Reactive hyperkeratosis porokeratosis to the toes and plantar aspect of the foot bilaterally.  Vascular: Dorsalis Pedis artery and Posterior Tibial artery pedal pulses are 2/4 bilateral with immedate capillary fill time. Pedal hair growth present. No varicosities and no lower  extremity edema present bilateral.   Neruologic: Grossly intact via light touch bilateral. Vibratory intact via tuning fork bilateral. Protective threshold with Semmes Wienstein monofilament intact to all pedal sites bilateral. Patellar and Achilles deep tendon reflexes 2+ bilateral. No Babinski or clonus noted bilateral.   Musculoskeletal: No gross boney pedal deformities bilateral. No pain, crepitus, or limitation noted with foot and ankle range of motion bilateral. Muscular strength 5/5 in all groups tested bilateral.  Severe diastases from the second and third digits of the right foot.  Severe hammertoe deformity rigid in nature at the level of the second metatarsal phalangeal joint and the PIPJ and DIPJ flexor contractures.  Gait: Unassisted, Nonantalgic.    Radiographs:  Radiographs taken today of the left and right foot demonstrate calcaneal osteotomy and first metatarsal cuneiform arthrodesis right foot.  There is been some retrograding of the screw from the calcaneus.  Complete arthrodesis of the Lapidus procedure.  She has a diastases between her second and third toes most likely secondary to elongated second and third metatarsals.  Severe hammertoe deformity second metatarsophalangeal joint appears to be rigid in nature.  Assessment & Plan:   Assessment: Reactive hyperkeratosis and hammertoe deformities right foot.  Plan: Debrided all reactive hyperkeratoses for her today discussed appropriate shoe gear discussed the possible need for surgical intervention.     Max T. LockportHyatt, [place] Dakota[place]PM

## 2017-06-30 ENCOUNTER — Ambulatory Visit: Payer: BLUE CROSS/BLUE SHIELD | Admitting: Rheumatology

## 2017-07-08 DIAGNOSIS — R102 Pelvic and perineal pain: Secondary | ICD-10-CM | POA: Diagnosis not present

## 2017-07-08 DIAGNOSIS — R1084 Generalized abdominal pain: Secondary | ICD-10-CM | POA: Diagnosis not present

## 2017-07-08 DIAGNOSIS — N390 Urinary tract infection, site not specified: Secondary | ICD-10-CM | POA: Diagnosis not present

## 2017-07-08 DIAGNOSIS — B9620 Unspecified Escherichia coli [E. coli] as the cause of diseases classified elsewhere: Secondary | ICD-10-CM | POA: Diagnosis not present

## 2017-07-17 DIAGNOSIS — N3000 Acute cystitis without hematuria: Secondary | ICD-10-CM | POA: Diagnosis not present

## 2017-07-17 DIAGNOSIS — R1084 Generalized abdominal pain: Secondary | ICD-10-CM | POA: Diagnosis not present

## 2017-08-03 ENCOUNTER — Ambulatory Visit: Payer: BLUE CROSS/BLUE SHIELD | Admitting: Family Medicine

## 2017-08-05 DIAGNOSIS — N200 Calculus of kidney: Secondary | ICD-10-CM | POA: Diagnosis not present

## 2017-08-05 DIAGNOSIS — B9620 Unspecified Escherichia coli [E. coli] as the cause of diseases classified elsewhere: Secondary | ICD-10-CM | POA: Diagnosis not present

## 2017-08-05 DIAGNOSIS — R1084 Generalized abdominal pain: Secondary | ICD-10-CM | POA: Diagnosis not present

## 2017-08-05 DIAGNOSIS — N390 Urinary tract infection, site not specified: Secondary | ICD-10-CM | POA: Diagnosis not present

## 2017-08-05 DIAGNOSIS — R300 Dysuria: Secondary | ICD-10-CM | POA: Diagnosis not present

## 2017-08-17 ENCOUNTER — Ambulatory Visit (INDEPENDENT_AMBULATORY_CARE_PROVIDER_SITE_OTHER): Payer: BLUE CROSS/BLUE SHIELD | Admitting: Neurology

## 2017-08-17 ENCOUNTER — Encounter: Payer: Self-pay | Admitting: Neurology

## 2017-08-17 ENCOUNTER — Encounter

## 2017-08-17 VITALS — BP 142/83 | HR 69 | Ht 66.0 in | Wt 183.0 lb

## 2017-08-17 DIAGNOSIS — R7303 Prediabetes: Secondary | ICD-10-CM | POA: Diagnosis not present

## 2017-08-17 DIAGNOSIS — E538 Deficiency of other specified B group vitamins: Secondary | ICD-10-CM

## 2017-08-17 DIAGNOSIS — G4761 Periodic limb movement disorder: Secondary | ICD-10-CM

## 2017-08-17 DIAGNOSIS — R202 Paresthesia of skin: Secondary | ICD-10-CM | POA: Diagnosis not present

## 2017-08-17 DIAGNOSIS — G609 Hereditary and idiopathic neuropathy, unspecified: Secondary | ICD-10-CM

## 2017-08-17 DIAGNOSIS — R51 Headache: Secondary | ICD-10-CM

## 2017-08-17 DIAGNOSIS — R519 Headache, unspecified: Secondary | ICD-10-CM

## 2017-08-17 MED ORDER — GABAPENTIN 300 MG PO CAPS: ORAL_CAPSULE | ORAL | 11 refills | Status: DC

## 2017-08-17 NOTE — Patient Instructions (Addendum)
Peripheral Neuropathy Peripheral neuropathy is a type of nerve damage. It affects nerves that carry signals between the spinal cord and other parts of the body. These are called peripheral nerves. With peripheral neuropathy, one nerve or a group of nerves may be damaged. What are the causes? Many things can damage peripheral nerves. For some people with peripheral neuropathy, the cause is unknown. Some causes include:  Diabetes. This is the most common cause of peripheral neuropathy.  Injury to a nerve.  Pressure or stress on a nerve that lasts a long time.  Too little vitamin B. Alcoholism can lead to this.  Infections.  Autoimmune diseases, such as multiple sclerosis and systemic lupus erythematosus.  Inherited nerve diseases.  Some medicines, such as cancer drugs.  Toxic substances, such as lead and mercury.  Too little blood flowing to the legs.  Kidney disease.  Thyroid disease.  What are the signs or symptoms? Different people have different symptoms. The symptoms you have will depend on which of your nerves is damaged. Common symptoms include:  Loss of feeling (numbness) in the feet and hands.  Tingling in the feet and hands.  Pain that burns.  Very sensitive skin.  Weakness.  Not being able to move a part of the body (paralysis).  Muscle twitching.  Clumsiness or poor coordination.  Loss of balance.  Not being able to control your bladder.  Feeling dizzy.  Sexual problems.  How is this diagnosed? Peripheral neuropathy is a symptom, not a disease. Finding the cause of peripheral neuropathy can be hard. To figure that out, your health care provider will take a medical history and do a physical exam. A neurological exam will also be done. This involves checking things affected by your brain, spinal cord, and nerves (nervous system). For example, your health care provider will check your reflexes, how you move, and what you can feel. Other types of tests  may also be ordered, such as:  Blood tests.  A test of the fluid in your spinal cord.  Imaging tests, such as CT scans or an MRI.  Electromyography (EMG). This test checks the nerves that control muscles.  Nerve conduction velocity tests. These tests check how fast messages pass through your nerves.  Nerve biopsy. A small piece of nerve is removed. It is then checked under a microscope.  How is this treated?  Medicine is often used to treat peripheral neuropathy. Medicines may include: ? Pain-relieving medicines. Prescription or over-the-counter medicine may be suggested. ? Antiseizure medicine. This may be used for pain. ? Antidepressants. These also may help ease pain from neuropathy. ? Lidocaine. This is a numbing medicine. You might wear a patch or be given a shot. ? Mexiletine. This medicine is typically used to help control irregular heart rhythms.  Surgery. Surgery may be needed to relieve pressure on a nerve or to destroy a nerve that is causing pain.  Physical therapy to help movement.  Assistive devices to help movement. Follow these instructions at home:  Only take over-the-counter or prescription medicines as directed by your health care provider. Follow the instructions carefully for any given medicines. Do not take any other medicines without first getting approval from your health care provider.  If you have diabetes, work closely with your health care provider to keep your blood sugar under control.  If you have numbness in your feet: ? Check every day for signs of injury or infection. Watch for redness, warmth, and swelling. ? Wear padded socks and comfortable   shoes. These help protect your feet.  Do not do things that put pressure on your damaged nerve.  Do not smoke. Smoking keeps blood from getting to damaged nerves.  Avoid or limit alcohol. Too much alcohol can cause a lack of B vitamins. These vitamins are needed for healthy nerves.  Develop a good  support system. Coping with peripheral neuropathy can be stressful. Talk to a mental health specialist or join a support group if you are struggling.  Follow up with your health care provider as directed. Contact a health care provider if:  You have new signs or symptoms of peripheral neuropathy.  You are struggling emotionally from dealing with peripheral neuropathy.  You have a fever. Get help right away if:  You have an injury or infection that is not healing.  You feel very dizzy or begin vomiting.  You have chest pain.  You have trouble breathing. This information is not intended to replace advice given to you by your health care provider. Make sure you discuss any questions you have with your health care provider. Document Released: 12/13/2001 Document Revised: 05/31/2015 Document Reviewed: 08/30/2012 Elsevier Interactive Patient Education  2017 [place] both hands and right leg.  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 B. burgdorfi Antibody  . Methylmalonic acid, serum  . Vitamin B1  . ANA  . ANA, IFA (with reflex)  . CYCLIC CITRUL PEPTIDE ANTIBODY, IGG/IGA  . Angiotensin converting enzyme  . Sjogren's syndrome antibods(ssa + ssb)  . B12 and Folate Panel  . Hepatitis C antibody  . Tissue transglutaminase, IgA  . Gliadin antibodies, serum  . Heavy metals, blood  . Rheumatoid factor  . Vitamin B6  . Multiple Myeloma Panel (SPEP&IFE w/QIG)  . Glucose tolerance, 2 hours  . Ambulatory referral to Sleep Studies  . NCV with EMG(electromyography)    Gabapentin capsules or tablets What is this  medicine? GABAPENTIN (GA ba pen tin) is used to control partial seizures in adults with epilepsy. It is also used to treat certain types of nerve pain. This medicine may be used for other purposes; ask your health care provider or pharmacist if you have questions. COMMON BRAND NAME(S): Active-PAC with Gabapentin, Gabarone, Neurontin What should I tell my health care provider before I take this medicine? They need to know if you have any of these conditions: -kidney disease -suicidal thoughts, plans, or attempt; a previous suicide attempt by you or a family member -an unusual or allergic reaction to gabapentin, other medicines, foods, dyes, or preservatives -pregnant or trying to get pregnant -breast-feeding How should I use this medicine? Take this medicine by mouth with a glass of water. Follow the directions on the prescription label. You can take it with or without food. If it upsets your stomach, take it with food.Take your medicine at regular intervals. Do not take it more often than directed. Do not stop taking except on your doctor's advice. If you are directed to break the 600 or 800 mg tablets in half as part of your dose, the extra half tablet should be used for the next dose. If you  have not used the extra half tablet within 28 days, it should be thrown away. A special MedGuide will be given to you by the pharmacist with each prescription and refill. Be sure to read this information carefully each time. Talk to your pediatrician regarding the use of this medicine in children. Special care may be needed. Overdosage: If you think you have taken too much of this medicine contact a poison control center or emergency room at once. NOTE: This medicine is only for you. Do not share this medicine with others. What if I miss a dose? If you miss a dose, take it as soon as you can. If it is almost time for your next dose, take only that dose. Do not take double or extra doses. What may interact  with this medicine? Do not take this medicine with any of the following medications: -other gabapentin products This medicine may also interact with the following medications: -alcohol -antacids -antihistamines for allergy, cough and cold -certain medicines for anxiety or sleep -certain medicines for depression or psychotic disturbances -homatropine; hydrocodone -naproxen -narcotic medicines (opiates) for pain -phenothiazines like chlorpromazine, mesoridazine, prochlorperazine, thioridazine This list may not describe all possible interactions. Give your health care provider a list of all the medicines, herbs, non-prescription drugs, or dietary supplements you use. Also tell them if you smoke, drink alcohol, or use illegal drugs. Some items may interact with your medicine. What should I watch for while using this medicine? Visit your doctor or health care professional for regular checks on your progress. You may want to keep a record at home of how you feel your condition is responding to treatment. You may want to share this information with your doctor or health care professional at each visit. You should contact your doctor or health care professional if your seizures get worse or if you have any new types of seizures. Do not stop taking this medicine or any of your seizure medicines unless instructed by your doctor or health care professional. Stopping your medicine suddenly can increase your seizures or their severity. Wear a medical identification bracelet or chain if you are taking this medicine for seizures, and carry a card that lists all your medications. You may get drowsy, dizzy, or have blurred vision. Do not drive, use machinery, or do anything that needs mental alertness until you know how this medicine affects you. To reduce dizzy or fainting spells, do not sit or stand up quickly, especially if you are an older patient. Alcohol can increase drowsiness and dizziness. Avoid alcoholic  drinks. Your mouth may get dry. Chewing sugarless gum or sucking hard candy, and drinking plenty of water will help. The use of this medicine may increase the chance of suicidal thoughts or actions. Pay special attention to how you are responding while on this medicine. Any worsening of mood, or thoughts of suicide or dying should be reported to your health care professional right away. Women who become pregnant while using this medicine may enroll in the [place] Pregnancy Registry by calling [phone]. This registry collects information about the safety of antiepileptic drug use during pregnancy. What side effects may I notice from receiving this medicine? Side effects that you should report to your doctor or health care professional as soon as possible: -allergic reactions like skin rash, itching or hives, swelling of the face, lips, or tongue -worsening of mood, thoughts or actions of suicide or dying Side effects that usually do not require medical attention (report to  your doctor or health care professional if they continue or are bothersome): -constipation -difficulty walking or controlling muscle movements -dizziness -nausea -slurred speech -tiredness -tremors -weight gain This list may not describe all possible side effects. Call your doctor for medical advice about side effects. You may report side effects to FDA at 1-800-FDA-1088. Where should I keep my medicine? Keep out of reach of children. This medicine may cause accidental overdose and death if it taken by other adults, children, or pets. Mix any unused medicine with a substance like cat litter or coffee grounds. Then throw the medicine away in a sealed container like a sealed bag or a coffee can with a lid. Do not use the medicine after the expiration date. Store at room temperature between 15 and 30 degrees C (59 and 86 degrees F). NOTE: This sheet is a summary. It may not cover all possible  information. If you have questions about this medicine, talk to your doctor, pharmacist, or health care provider.  2018 Elsevier/Gold Standard (2013-02-18 15:26:50)

## 2017-08-17 NOTE — Progress Notes (Signed)
[facility]    Provider:  Dr Jaynee Eagles Referring Provider: Mellody Dance, DO Primary Care Physician:  Mellody Dance, DO  CC:  Paresthesias in the feet  HPI:  Marissa Larson is a 62 y.o. female here as a referral from Dr. Raliegh Scarlet for paresthesias in the hands and feet. PMHx HTN, hypothyroid,obesity, osteoarthritis of the feet, pseudogout.  Husband here and provides much information. She has numbness and tingling in the fingers and toes, started a year ago sporadic, then worsened and became purple and painful, wet to rheum, went to vascular. Tops would turn purple and red, saw a dermatology, red and painful, no associated with cold. Just the tips and whole toes little toes more affected than the big toes.  She started having numbness in her feet unrelated to the color changes, and in the face. Also burning and pins and needles, numbness not associated. No known triggers. Numbness during the day, the hands are positional, whole hand, she types 12 hours a day. Sister with neuropathy, lyme's disease. Also itching. Lot of insomnia, not being able to shut mind off, feet are worse in the evenings, she has pain and itching. No falls. No other focal neurologic deficits, associated symptoms, inciting events or modifiable factors.  Reviewed notes, labs and imaging from outside physicians, which showed:  referred by rheumatology. pt has numbness in feet and hands bilaterally. she states more recently she has started having issues with itching in hands and feet bilaterally. pt started about a year ago with purple toes and then this year became more frequent and painful earlier this year. pt also describes she has difficulty with sleeping.   B12 431 hgba1c 5.7 ldl 101 tsh nml Vit d 34.7  Review of Systems: Patient complains of symptoms per HPI as well as the following symptoms: numbness, weakness. Pertinent negatives and positives per HPI. All others negative.   Social History    Socioeconomic History  . Marital status: Married    Spouse name: Not on file  . Number of children: Not on file  . Years of education: Not on file  . Highest education level: Not on file  Occupational History  . Not on file  Social Needs  . Financial resource strain: Not on file  . Food insecurity:    Worry: Not on file    Inability: Not on file  . Transportation needs:    Medical: Not on file    Non-medical: Not on file  Tobacco Use  . Smoking status: Never Smoker  . Smokeless tobacco: Never Used  Substance and Sexual Activity  . Alcohol use: No    Frequency: Never    Comment: rarely   . Drug use: No  . Sexual activity: Yes    Birth control/protection: None  Lifestyle  . Physical activity:    Days per week: Not on file    Minutes per session: Not on file  . Stress: Not on file  Relationships  . Social connections:    Talks on phone: Not on file    Gets together: Not on file    Attends religious service: Not on file    Active member of club or organization: Not on file    Attends meetings of clubs or organizations: Not on file    Relationship status: Not on file  . Intimate partner violence:    Fear of current or ex partner: Not on file    Emotionally abused: Not on file    Physically abused: Not on  file    Forced sexual activity: Not on file  Other Topics Concern  . Not on file  Social History Narrative  . Not on file    Family History  Problem Relation Age of Onset  . Migraines Other        Fam Hx   . Heart disease Other        Fam Hx  . Thyroid disease Other        Fam Hx - She also has thyroid disease  . Diabetes Other        Fam Hx  . Arthritis Other        Fam Hx  . Gallbladder disease Other        Fam Hx  . Cancer Other        Fam Hx - Pancreatic  . Hypertension Other        Fam Hx  . Heart attack Cousin        Multiple cousins with MI's before 46 years of age  . Heart disease Mother 66  . Hyperlipidemia Mother 61  . Hypertension  Mother 56  . Stroke Mother 64  . Heart disease Maternal Grandmother   . Cancer Father 48       pancreatic  . Diabetes Brother 64  . Heart attack Maternal Uncle   . Heart attack Paternal Aunt     Past Medical History:  Diagnosis Date  . Arthritis   . Chest pain 06/2013   Left sided  . Chronic leg pain 06/2013  . Fatigue   . Nonproductive cough 06/2013  . Overweight 01/18/2014  . Peripheral neuropathy   . Sleep difficulties    Change in sleep patterns  . SOB (shortness of breath) 06/2013  . Tachycardia 06/2013  . Thyroid disease    hypo  . UTI (lower urinary tract infection)     Past Surgical History:  Procedure Laterality Date  . ABDOMINAL SURGERY  1994  . FOOT SURGERY Right 2009  . KIDNEY SURGERY Left 1993  . KNEE SURGERY     Multiple knee surgeries  . OOPHORECTOMY Left 1994  . URETEROSCOPY VIA URETEROSTOMY Left 1994    Current Outpatient Medications  Medication Sig Dispense Refill  . Ascorbic Acid (VITAMIN C) 1000 MG tablet Take 1,000 mg by mouth daily.    Marland Kitchen COLCRYS 0.6 MG tablet TAKE 1 TABLET BY MOUTH 2 TIMES DAILY. 180 tablet 1  . escitalopram (LEXAPRO) 20 MG tablet Take 1 tablet (20 mg total) by mouth daily. 90 tablet 1  . Estradiol-Estriol-Progesterone (BIEST/PROGESTERONE) CREA Place onto the skin daily.    . eszopiclone (LUNESTA) 1 MG TABS tablet Take 1 tablet by mouth at bedtime as needed.    . famotidine (PEPCID) 40 MG tablet   2  . Flaxseed, Linseed, (FLAXSEED OIL PO) Take 3 capsules by mouth daily.    . hydrochlorothiazide (MICROZIDE) 12.5 MG capsule Take 1 capsule by mouth daily.    Marland Kitchen levothyroxine (SYNTHROID, LEVOTHROID) 112 MCG tablet Take 112 mcg by mouth daily before breakfast.    . magnesium gluconate (MAGONATE) 500 MG tablet Take 1,000 mg by mouth daily.    . Misc Natural Products (TART CHERRY ADVANCED PO) Take by mouth daily.    . Omega-3 Fatty Acids (FISH OIL PO) Take 2 capsules by mouth daily.    . Progesterone Micronized (PROGESTERONE PO) Take 150  mg by mouth daily.    . TURMERIC PO Take 100 mg by mouth daily.    Marland Kitchen  Vitamin D, Ergocalciferol, (DRISDOL) 50000 units CAPS capsule TAKE 1 CAPSULE BY MOUTH EVERY 7 DAYS. 12 capsule 20  . amLODipine (NORVASC) 5 MG tablet Take 1 tablet (5 mg total) by mouth daily. (Patient not taking: Reported on 08/17/2017) 30 tablet 1  . famotidine (PEPCID) 40 MG tablet Take 40 mg by mouth.    . gabapentin (NEURONTIN) 300 MG capsule 1-3 (327m-900mg) tabs at bedtime 90 capsule 11   No current facility-administered medications for this visit.     Allergies as of 08/17/2017 - Review Complete 08/17/2017  Allergen Reaction Noted  . Advil [ibuprofen] Nausea Only 10/14/2015  . Penicillins Hives and Itching 06/17/2013  . Sulfa antibiotics Hives and Itching 06/17/2013    Vitals: BP (!) 142/83   Pulse 69   Ht '5\' 6"'  (1.676 m)   Wt 183 lb (83 kg)   BMI 29.54 kg/m  Last Weight:  Wt Readings from Last 1 Encounters:  08/17/17 183 lb (83 kg)   Last Height:   Ht Readings from Last 1 Encounters:  08/17/17 '5\' 6"'  (1.676 m)   Physical exam: Exam: Gen: NAD, conversant, well nourised, overniight, well groomed                     CV: RRR, no MRG. No Carotid Bruits. No peripheral edema, warm, nontender Eyes: Conjunctivae clear without exudates or hemorrhage  Neuro: Detailed Neurologic Exam  Speech:    Speech is normal; fluent and spontaneous with normal comprehension.  Cognition:    The patient is oriented to person, place, and time;     recent and remote memory intact;     language fluent;     normal attention, concentration,     fund of knowledge Cranial Nerves:    The pupils are equal, round, and reactive to light. Attempted fundoscopic exam could not visualize. . Visual fields are full to finger confrontation. Extraocular movements are intact. Trigeminal sensation is intact and the muscles of mastication are normal. The face is symmetric. The palate elevates in the midline. Hearing intact. Voice is  normal. Shoulder shrug is normal. The tongue has normal motion without fasciculations.   Coordination:    No dysmetria  Gait:    Heel-toe and tandem gait are normal.   Motor Observation: Flat feet, structural feet abnormalities, hammertoes  Tone:    Normal muscle tone.    Posture:    Posture is normal. normal erect    Strength:    Strength is V/V in the upper and lower limbs.      Sensation: decreased pp to mid calves, intact vibration and proprioception     Reflex Exam:  DTR's:    Absent Ajs. Deep tendon reflexes in the upper and lower extremities are normal bilaterally.   Toes:    The toes are downgoing bilaterally.   Clonus:    Clonus is absent.       Assessment/Plan:  62year old patient with paresthesias in the feet and hands. She has been on Colchicine daily, this may be the cause however need a thorough evaluation. Discussed in detail with patient and husband. She has very flat feet, hammer toes, no tapering legs.  - Emg/ncs both hands and right leg.  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  Orders Placed This Encounter  Procedures  . B. burgdorfi Antibody  . Methylmalonic acid, serum  . Vitamin B1  . ANA  . ANA, IFA (with reflex)  . CYCLIC CITRUL PEPTIDE ANTIBODY, IGG/IGA  . Angiotensin converting enzyme  . Sjogren's syndrome antibods(ssa + ssb)  . B12 and Folate Panel  . Hepatitis C  antibody  . Tissue transglutaminase, IgA  . Gliadin antibodies, serum  . Heavy metals, blood  . Rheumatoid factor  . Vitamin B6  . Multiple Myeloma Panel (SPEP&IFE w/QIG)  . Glucose tolerance, 2 hours  . Ambulatory referral to Sleep Studies  . NCV with EMG(electromyography)   Meds ordered this encounter  Medications  . gabapentin (NEURONTIN) 300 MG capsule    Sig: 1-3 (373m-900mg) tabs at bedtime    Dispense:  90 capsule    Refill:  11    Cc: Dr. [facility]herryle Lis [facility] [address] [address] Phone [phone] [phone]

## 2017-08-18 ENCOUNTER — Telehealth: Payer: Self-pay | Admitting: *Deleted

## 2017-08-18 ENCOUNTER — Encounter: Payer: Self-pay | Admitting: Neurology

## 2017-08-18 DIAGNOSIS — G629 Polyneuropathy, unspecified: Secondary | ICD-10-CM | POA: Insufficient documentation

## 2017-08-18 NOTE — Telephone Encounter (Signed)
Faxed signed [facility] order for genetic testing. Received a receipt of confirmation.

## 2017-08-20 LAB — METHYLMALONIC ACID, SERUM: Methylmalonic Acid: 195 nmol/L (ref 0–378)

## 2017-08-20 LAB — MULTIPLE MYELOMA PANEL, SERUM
Albumin SerPl Elph-Mcnc: 3.8 g/dL (ref 2.9–4.4)
Albumin/Glob SerPl: 1.4 (ref 0.7–1.7)
Alpha 1: 0.3 g/dL (ref 0.0–0.4)
Alpha2 Glob SerPl Elph-Mcnc: 0.7 g/dL (ref 0.4–1.0)
B-Globulin SerPl Elph-Mcnc: 1.1 g/dL (ref 0.7–1.3)
Gamma Glob SerPl Elph-Mcnc: 0.8 g/dL (ref 0.4–1.8)
Globulin, Total: 2.8 g/dL (ref 2.2–3.9)
IgA/Immunoglobulin A, Serum: 163 mg/dL (ref 87–352)
IgG (Immunoglobin G), Serum: 686 mg/dL — ABNORMAL LOW (ref 700–1600)
IgM (Immunoglobulin M), Srm: 40 mg/dL (ref 26–217)
Total Protein: 6.6 g/dL (ref 6.0–8.5)

## 2017-08-20 LAB — VITAMIN B6: Vitamin B6: 12.8 ug/L (ref 2.0–32.8)

## 2017-08-20 LAB — B12 AND FOLATE PANEL
FOLATE: 15.1 ng/mL (ref >3.0)
VITAMIN B 12: 397 pg/mL (ref 232–1245)

## 2017-08-20 LAB — CYCLIC CITRUL PEPTIDE ANTIBODY, IGG/IGA: Cyclic Citrullin Peptide Ab: 4 units (ref 0–19)

## 2017-08-20 LAB — RHEUMATOID FACTOR: Rhuematoid fact SerPl-aCnc: <10 IU/mL (ref 0.0–13.9)

## 2017-08-20 LAB — ANGIOTENSIN CONVERTING ENZYME: Angio Convert Enzyme: 34 U/L (ref 14–82)

## 2017-08-20 LAB — SJOGREN'S SYNDROME ANTIBODS(SSA + SSB)
ENA SSA (RO) Ab: <0.2 AI (ref 0.0–0.9)
ENA SSB (LA) Ab: <0.2 AI (ref 0.0–0.9)

## 2017-08-20 LAB — GLIADIN ANTIBODIES, SERUM
ANTIGLIADIN ABS, IGA: 3 U (ref 0–19)
GLIADIN IGG: 1 U (ref 0–19)

## 2017-08-20 LAB — HEPATITIS C ANTIBODY: Hep C Virus Ab: <0.1 s/co ratio (ref 0.0–0.9)

## 2017-08-20 LAB — TISSUE TRANSGLUTAMINASE, IGA: Transglutaminase IgA: <2 U/mL (ref 0–3)

## 2017-08-20 LAB — VITAMIN B1: THIAMINE: 110 nmol/L (ref 66.5–200.0)

## 2017-08-20 LAB — HEAVY METALS, BLOOD
Arsenic: 6 ug/L (ref 2–23)
Lead, Blood: NOT DETECTED ug/dL (ref 0–4)
Mercury: 1.9 ug/L (ref 0.0–14.9)

## 2017-08-20 LAB — ANA: ANA Titer 1: NEGATIVE

## 2017-08-20 LAB — B. BURGDORFI ANTIBODIES: Lyme IgG/IgM Ab: <0.91 {ISR} (ref 0.00–0.90)

## 2017-08-25 DIAGNOSIS — N390 Urinary tract infection, site not specified: Secondary | ICD-10-CM | POA: Diagnosis not present

## 2017-08-25 DIAGNOSIS — B9620 Unspecified Escherichia coli [E. coli] as the cause of diseases classified elsewhere: Secondary | ICD-10-CM | POA: Diagnosis not present

## 2017-08-27 ENCOUNTER — Telehealth: Payer: Self-pay | Admitting: Neurology

## 2017-08-27 DIAGNOSIS — R300 Dysuria: Secondary | ICD-10-CM | POA: Diagnosis not present

## 2017-08-27 DIAGNOSIS — B9620 Unspecified Escherichia coli [E. coli] as the cause of diseases classified elsewhere: Secondary | ICD-10-CM | POA: Diagnosis not present

## 2017-08-27 DIAGNOSIS — N3000 Acute cystitis without hematuria: Secondary | ICD-10-CM | POA: Diagnosis not present

## 2017-08-27 DIAGNOSIS — R1111 Vomiting without nausea: Secondary | ICD-10-CM | POA: Diagnosis not present

## 2017-08-27 DIAGNOSIS — N390 Urinary tract infection, site not specified: Secondary | ICD-10-CM | POA: Diagnosis not present

## 2017-08-27 DIAGNOSIS — R1084 Generalized abdominal pain: Secondary | ICD-10-CM | POA: Diagnosis not present

## 2017-08-27 NOTE — Telephone Encounter (Signed)
Spoke with pt. She stated she was taking 900 mg at night. She was advised that the directions actually say that she can take 1-3 capsules (400 mg to 900 mg) nightly so she is free to decrease her dose to 600 mg for now and see if she improves. She can then further decrease to 300 mg if needed. Pt appreciative for the information and was able to see the instructions on her bottle. She had no further concerns or questions.

## 2017-08-27 NOTE — Telephone Encounter (Signed)
Pt states that the gabapentin (NEURONTIN) 300 MG capsule works fabuslously and pt states she sleeps like a rock.  Pt states the only bad side is that the effects of the gabapentin (NEURONTIN) 300 MG capsule carry on late into the next day.  Pt asking for a call to discuss lowering the dose.  Please call

## 2017-09-17 ENCOUNTER — Other Ambulatory Visit: Payer: Self-pay | Admitting: Urology

## 2017-09-17 DIAGNOSIS — Q625 Duplication of ureter: Secondary | ICD-10-CM | POA: Diagnosis not present

## 2017-09-17 DIAGNOSIS — N390 Urinary tract infection, site not specified: Secondary | ICD-10-CM | POA: Diagnosis not present

## 2017-09-17 DIAGNOSIS — R1084 Generalized abdominal pain: Secondary | ICD-10-CM | POA: Diagnosis not present

## 2017-09-17 DIAGNOSIS — B9620 Unspecified Escherichia coli [E. coli] as the cause of diseases classified elsewhere: Secondary | ICD-10-CM | POA: Diagnosis not present

## 2017-09-17 DIAGNOSIS — N202 Calculus of kidney with calculus of ureter: Secondary | ICD-10-CM | POA: Diagnosis not present

## 2017-09-17 DIAGNOSIS — N3020 Other chronic cystitis without hematuria: Secondary | ICD-10-CM | POA: Diagnosis not present

## 2017-09-18 NOTE — Patient Instructions (Addendum)
Tilden DomeMichele Clipper  09/18/2017   Your procedure is scheduled on: 09-22-17   Report to [facility] Main  Entrance    Report to Admitting at 8:30 AM    Call this number if you have problems the morning of surgery [phone]   Remember: Do not eat food or drink liquids :After Midnight.    BRUSH YOUR TEETH MORNING OF SURGERY AND RINSE YOUR MOUTH OUT, NO CHEWING GUM CANDY OR MINTS.     Take these medicines the morning of surgery with A SIP OF WATER: Levothyroxine (Synthroid), Colcrys (Chocoline)                                You may not have any metal on your body including hair pins and              piercings  Do not wear jewelry, make-up, lotions, powders or perfumes, deodorant             Do not wear nail polish.  Do not shave  48 hours prior to surgery.               Do not bring valuables to the hospital. [place] IS NOT             RESPONSIBLE   FOR VALUABLES.  Contacts, dentures or bridgework may not be worn into surgery.      Patients discharged the day of surgery will not be allowed to drive home.  Name and phone number of your driver: Lowella DandyGregg Wentling [phone]  Special Instructions: N/A              Please read over the following fact sheets you were given: _____________________________________________________________________             [facility] - Preparing for Surgery Before surgery, you can play an important role.  Because skin is not sterile, your skin needs to be as free of germs as possible.  You can reduce the number of germs on your skin by washing with CHG (chlorahexidine gluconate) soap before surgery.  CHG is an antiseptic cleaner which kills germs and bonds with the skin to continue killing germs even after washing. Please DO NOT use if you have an allergy to CHG or antibacterial soaps.  If your skin becomes reddened/irritated stop using the CHG and inform your nurse when you arrive at Short Stay. Do not shave (including legs and  underarms) for at least 48 hours prior to the first CHG shower.  You may shave your face/neck. Please follow these instructions carefully:  1.  Shower with CHG Soap the night before surgery and the  morning of Surgery.  2.  If you choose to wash your hair, wash your hair first as usual with your  normal  shampoo.  3.  After you shampoo, rinse your hair and body thoroughly to remove the  shampoo.                           4.  Use CHG as you would any other liquid soap.  You can apply chg directly  to the skin and wash                       Gently with a scrungie or clean washcloth.  5.  Apply the CHG Soap to your body ONLY FROM THE NECK DOWN.   Do not use on face/ open                           Wound or open sores. Avoid contact with eyes, ears mouth and genitals (private parts).                       Wash face,  Genitals (private parts) with your normal soap.             6.  Wash thoroughly, paying special attention to the area where your surgery  will be performed.  7.  Thoroughly rinse your body with warm water from the neck down.  8.  DO NOT shower/wash with your normal soap after using and rinsing off  the CHG Soap.                9.  Pat yourself dry with a clean towel.            10.  Wear clean pajamas.            11.  Place clean sheets on your bed the night of your first shower and do not  sleep with pets. Day of Surgery : Do not apply any lotions/deodorants the morning of surgery.  Please wear clean clothes to the hospital/surgery center.  FAILURE TO FOLLOW THESE INSTRUCTIONS MAY RESULT IN THE CANCELLATION OF YOUR SURGERY PATIENT SIGNATURE_________________________________  NURSE SIGNATURE__________________________________  ________________________________________________________________________

## 2017-09-18 NOTE — Progress Notes (Signed)
03-12-17 (Epic) CXR

## 2017-09-21 ENCOUNTER — Other Ambulatory Visit: Payer: Self-pay

## 2017-09-21 ENCOUNTER — Encounter (HOSPITAL_COMMUNITY): Payer: Self-pay

## 2017-09-21 ENCOUNTER — Encounter (HOSPITAL_COMMUNITY)
Admission: RE | Admit: 2017-09-21 | Discharge: 2017-09-21 | Disposition: A | Discharge status: Home / Self Care | Payer: BLUE CROSS/BLUE SHIELD | Source: Ambulatory Visit | Attending: Urology | Admitting: Urology

## 2017-09-21 DIAGNOSIS — Z01818 Encounter for other preprocedural examination: Secondary | ICD-10-CM | POA: Diagnosis not present

## 2017-09-21 DIAGNOSIS — I10 Essential (primary) hypertension: Secondary | ICD-10-CM | POA: Insufficient documentation

## 2017-09-21 HISTORY — DX: Personal history of urinary calculi: Z87.442

## 2017-09-21 HISTORY — DX: Gastro-esophageal reflux disease without esophagitis: K21.9

## 2017-09-21 HISTORY — DX: Other complications of anesthesia, initial encounter: T88.59XA

## 2017-09-21 HISTORY — DX: Other specified postprocedural states: Z98.890

## 2017-09-21 HISTORY — DX: Cardiac arrhythmia, unspecified: I49.9

## 2017-09-21 HISTORY — DX: Hypothyroidism, unspecified: E03.9

## 2017-09-21 HISTORY — DX: Essential (primary) hypertension: I10

## 2017-09-21 HISTORY — DX: Adverse effect of unspecified anesthetic, initial encounter: T41.45XA

## 2017-09-21 HISTORY — DX: Other specified postprocedural states: R11.2

## 2017-09-21 LAB — CBC
HCT: 44.6 % (ref 36.0–46.0)
HEMOGLOBIN: 15.3 g/dL — AB (ref 12.0–15.0)
MCH: 32.3 pg (ref 26.0–34.0)
MCHC: 34.3 g/dL (ref 30.0–36.0)
MCV: 94.1 fL (ref 78.0–100.0)
PLATELETS: 255 10*3/uL (ref 150–400)
RBC: 4.74 MIL/uL (ref 3.87–5.11)
RDW: 12.7 % (ref 11.5–15.5)
WBC: 6.5 10*3/uL (ref 4.0–10.5)

## 2017-09-21 LAB — BASIC METABOLIC PANEL
Anion gap: 10 (ref 5–15)
BUN: 14 mg/dL (ref 6–20)
CALCIUM: 9.7 mg/dL (ref 8.9–10.3)
CO2: 28 mmol/L (ref 22–32)
CREATININE: 0.44 mg/dL (ref 0.44–1.00)
Chloride: 103 mmol/L (ref 98–111)
Glucose, Bld: 110 mg/dL — ABNORMAL HIGH (ref 70–99)
Potassium: 3.8 mmol/L (ref 3.5–5.1)
SODIUM: 141 mmol/L (ref 135–145)

## 2017-09-22 ENCOUNTER — Other Ambulatory Visit: Payer: Self-pay

## 2017-09-22 ENCOUNTER — Ambulatory Visit (HOSPITAL_COMMUNITY): Payer: BLUE CROSS/BLUE SHIELD | Admitting: Certified Registered Nurse Anesthetist

## 2017-09-22 ENCOUNTER — Ambulatory Visit (HOSPITAL_COMMUNITY)
Admission: RE | Admit: 2017-09-22 | Discharge: 2017-09-22 | Disposition: A | Discharge status: Home / Self Care | Payer: BLUE CROSS/BLUE SHIELD | Source: Ambulatory Visit | Attending: Urology | Admitting: Urology

## 2017-09-22 ENCOUNTER — Encounter (HOSPITAL_COMMUNITY): Payer: Self-pay | Admitting: Certified Registered Nurse Anesthetist

## 2017-09-22 ENCOUNTER — Encounter (HOSPITAL_COMMUNITY)
Admission: RE | Disposition: A | Discharge status: Home / Self Care | Payer: Self-pay | Source: Ambulatory Visit | Attending: Urology

## 2017-09-22 ENCOUNTER — Ambulatory Visit (HOSPITAL_COMMUNITY): Payer: BLUE CROSS/BLUE SHIELD

## 2017-09-22 DIAGNOSIS — Z87440 Personal history of urinary (tract) infections: Secondary | ICD-10-CM | POA: Diagnosis not present

## 2017-09-22 DIAGNOSIS — Q625 Duplication of ureter: Secondary | ICD-10-CM | POA: Diagnosis not present

## 2017-09-22 DIAGNOSIS — I10 Essential (primary) hypertension: Secondary | ICD-10-CM | POA: Diagnosis not present

## 2017-09-22 DIAGNOSIS — K219 Gastro-esophageal reflux disease without esophagitis: Secondary | ICD-10-CM | POA: Diagnosis not present

## 2017-09-22 DIAGNOSIS — R109 Unspecified abdominal pain: Secondary | ICD-10-CM | POA: Diagnosis not present

## 2017-09-22 DIAGNOSIS — N200 Calculus of kidney: Secondary | ICD-10-CM | POA: Insufficient documentation

## 2017-09-22 DIAGNOSIS — N390 Urinary tract infection, site not specified: Secondary | ICD-10-CM | POA: Diagnosis not present

## 2017-09-22 HISTORY — PX: CYSTOSCOPY/URETEROSCOPY/HOLMIUM LASER/STENT PLACEMENT: SHX6546

## 2017-09-22 SURGERY — CYSTOSCOPY/URETEROSCOPY/HOLMIUM LASER/STENT PLACEMENT
Anesthesia: General | Site: Ureter | Laterality: Left

## 2017-09-22 MED ORDER — ONDANSETRON HCL 4 MG/2ML IJ SOLN: 4.0000 mg | Freq: Once | INTRAMUSCULAR | Status: DC | PRN

## 2017-09-22 MED ORDER — FENTANYL CITRATE (PF) 100 MCG/2ML IJ SOLN
INTRAMUSCULAR | Status: AC
  Filled 2017-09-22: qty 2

## 2017-09-22 MED ORDER — SCOPOLAMINE 1 MG/3DAYS TD PT72
1.0000 | MEDICATED_PATCH | TRANSDERMAL | Status: DC
  Administered 2017-09-22: 1.5 mg via TRANSDERMAL

## 2017-09-22 MED ORDER — PROMETHAZINE HCL 25 MG/ML IJ SOLN
INTRAMUSCULAR | Status: AC
  Filled 2017-09-22: qty 1

## 2017-09-22 MED ORDER — HYDROMORPHONE HCL 1 MG/ML IJ SOLN: 0.2500 mg | INTRAMUSCULAR | Status: DC | PRN

## 2017-09-22 MED ORDER — DEXMEDETOMIDINE HCL 200 MCG/2ML IV SOLN
INTRAVENOUS | Status: DC | PRN
  Administered 2017-09-22: 20 ug via INTRAVENOUS

## 2017-09-22 MED ORDER — FENTANYL CITRATE (PF) 100 MCG/2ML IJ SOLN
INTRAMUSCULAR | Status: DC | PRN
  Administered 2017-09-22 (×4): 50 ug via INTRAVENOUS

## 2017-09-22 MED ORDER — PROPOFOL 10 MG/ML IV BOLUS
INTRAVENOUS | Status: AC
  Filled 2017-09-22: qty 20

## 2017-09-22 MED ORDER — PROMETHAZINE HCL 25 MG/ML IJ SOLN
INTRAMUSCULAR | Status: DC | PRN
  Administered 2017-09-22: 6.25 mg via INTRAVENOUS

## 2017-09-22 MED ORDER — HYDROCODONE-ACETAMINOPHEN 5-325 MG PO TABS: 1.0000 | ORAL_TABLET | ORAL | 0 refills | Status: DC | PRN

## 2017-09-22 MED ORDER — MEPERIDINE HCL 50 MG/ML IJ SOLN: 6.2500 mg | INTRAMUSCULAR | Status: DC | PRN

## 2017-09-22 MED ORDER — ONDANSETRON HCL 4 MG PO TABS: 4.0000 mg | ORAL_TABLET | Freq: Every day | ORAL | 1 refills | Status: DC | PRN

## 2017-09-22 MED ORDER — PHENAZOPYRIDINE HCL 200 MG PO TABS: 200.0000 mg | ORAL_TABLET | Freq: Three times a day (TID) | ORAL | 0 refills | Status: DC | PRN

## 2017-09-22 MED ORDER — DEXMEDETOMIDINE HCL IN NACL 200 MCG/50ML IV SOLN
INTRAVENOUS | Status: AC
  Filled 2017-09-22: qty 50

## 2017-09-22 MED ORDER — LACTATED RINGERS IV SOLN
INTRAVENOUS | Status: DC
  Administered 2017-09-22: 10:00:00 via INTRAVENOUS

## 2017-09-22 MED ORDER — IOHEXOL 300 MG/ML  SOLN
INTRAMUSCULAR | Status: DC | PRN
  Administered 2017-09-22: 40 mL via URETHRAL

## 2017-09-22 MED ORDER — DEXAMETHASONE SODIUM PHOSPHATE 4 MG/ML IJ SOLN
INTRAMUSCULAR | Status: DC | PRN
  Administered 2017-09-22: 10 mg via INTRAVENOUS

## 2017-09-22 MED ORDER — MIDAZOLAM HCL 2 MG/2ML IJ SOLN
INTRAMUSCULAR | Status: AC
  Filled 2017-09-22: qty 2

## 2017-09-22 MED ORDER — OXYBUTYNIN CHLORIDE 5 MG PO TABS: 5.0000 mg | ORAL_TABLET | Freq: Three times a day (TID) | ORAL | 1 refills | Status: DC | PRN

## 2017-09-22 MED ORDER — SCOPOLAMINE 1 MG/3DAYS TD PT72
MEDICATED_PATCH | TRANSDERMAL | Status: AC
  Administered 2017-09-22: 1.5 mg via TRANSDERMAL
  Filled 2017-09-22: qty 1

## 2017-09-22 MED ORDER — BELLADONNA ALKALOIDS-OPIUM 16.2-60 MG RE SUPP
RECTAL | Status: AC
  Administered 2017-09-22: 1
  Filled 2017-09-22: qty 1

## 2017-09-22 MED ORDER — PROPOFOL 10 MG/ML IV BOLUS
INTRAVENOUS | Status: DC | PRN
  Administered 2017-09-22: 140 mg via INTRAVENOUS

## 2017-09-22 MED ORDER — LIDOCAINE 2% (20 MG/ML) 5 ML SYRINGE
INTRAMUSCULAR | Status: DC | PRN
  Administered 2017-09-22: 80 mg via INTRAVENOUS

## 2017-09-22 MED ORDER — SODIUM CHLORIDE 0.9 % IR SOLN
Status: DC | PRN
  Administered 2017-09-22: 3000 mL

## 2017-09-22 MED ORDER — DEXAMETHASONE SODIUM PHOSPHATE 10 MG/ML IJ SOLN
INTRAMUSCULAR | Status: AC
  Filled 2017-09-22: qty 1

## 2017-09-22 MED ORDER — ONDANSETRON HCL 4 MG/2ML IJ SOLN
INTRAMUSCULAR | Status: DC | PRN
  Administered 2017-09-22: 4 mg via INTRAVENOUS

## 2017-09-22 MED ORDER — CIPROFLOXACIN IN D5W 400 MG/200ML IV SOLN
400.0000 mg | Freq: Once | INTRAVENOUS | Status: AC
  Administered 2017-09-22: 400 mg via INTRAVENOUS
  Filled 2017-09-22: qty 200

## 2017-09-22 MED ORDER — ONDANSETRON HCL 4 MG/2ML IJ SOLN
INTRAMUSCULAR | Status: AC
  Filled 2017-09-22: qty 2

## 2017-09-22 MED ORDER — MIDAZOLAM HCL 2 MG/2ML IJ SOLN
INTRAMUSCULAR | Status: DC | PRN
  Administered 2017-09-22 (×2): 2 mg via INTRAVENOUS

## 2017-09-22 SURGICAL SUPPLY — 24 items
BAG URO CATCHER STRL LF (MISCELLANEOUS) ×3 IMPLANT
BASKET ZERO TIP NITINOL 2.4FR (BASKET) IMPLANT
CATH URET 5FR 28IN OPEN ENDED (CATHETERS) ×3 IMPLANT
CLOTH BEACON ORANGE TIMEOUT ST (SAFETY) ×3 IMPLANT
EXTRACTOR STONE NITINOL NGAGE (UROLOGICAL SUPPLIES) ×3 IMPLANT
FIBER LASER FLEXIVA 365 (UROLOGICAL SUPPLIES) IMPLANT
FIBER LASER TRAC TIP (UROLOGICAL SUPPLIES) IMPLANT
GLOVE BIOGEL M STRL SZ7.5 (GLOVE) ×3 IMPLANT
GLOVE BIOGEL PI IND STRL 7.0 (GLOVE) ×1 IMPLANT
GLOVE BIOGEL PI IND STRL 7.5 (GLOVE) ×1 IMPLANT
GLOVE BIOGEL PI INDICATOR 7.0 (GLOVE) ×2
GLOVE BIOGEL PI INDICATOR 7.5 (GLOVE) ×2
GOWN BRE IMP SLV SIRUS LXLNG (GOWN DISPOSABLE) ×3 IMPLANT
GOWN STRL REUS W/TWL XL LVL3 (GOWN DISPOSABLE) ×3 IMPLANT
GUIDEWIRE STR DUAL SENSOR (WIRE) IMPLANT
GUIDEWIRE ZIPWRE .038 STRAIGHT (WIRE) ×3 IMPLANT
MANIFOLD NEPTUNE II (INSTRUMENTS) ×3 IMPLANT
PACK CYSTO (CUSTOM PROCEDURE TRAY) ×3 IMPLANT
SHEATH URETERAL 12FRX35CM (MISCELLANEOUS) IMPLANT
STENT CONTOUR 6FRX26X.038 (STENTS) IMPLANT
STENT URET 6FRX24 CONTOUR (STENTS) ×3 IMPLANT
TUBING CONNECTING 10 (TUBING) ×2 IMPLANT
TUBING CONNECTING 10' (TUBING) ×1
TUBING UROLOGY SET (TUBING) ×3 IMPLANT

## 2017-09-22 NOTE — Op Note (Signed)
Operative Note  Preoperative diagnosis:  1.  Recurrent urinary tract infections 2.  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 3.  Left renal stones in the lower pole moiety  Postoperative diagnosis: 1.  Recurrent urinary tract infections 2.  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 3.  Left renal stones in the lower pole moiety  Procedure(s): 1.  Cystoscopy 2.  Left ureteroscopy with basket stone extraction 3.  Left JJ stent placement 4.  Left retrograde pyelogram with intraoperative interpretation of fluoroscopic imaging  Surgeon: Rhoderick Moody, MD  Assistants:  None  Anesthesia:  General  Complications:  None  EBL: Less than 5 mL  Specimens: 1.  2 mm left renal stone  Drains/Catheters: 1.  6 French by 24 cm left JJ stent in the lower pole moiety  Intraoperative findings:   1. The ureteroureteral anastomosis was widely patent with no evidence of an obstructing stone. 2. The 7mm lower pole left renal stone seen on her CT from 08/27/2017 was not able to be identified during extensive ureteroscopy of the lower pole moiety.  My suspicion is that the stone is intraparenchymal 3. Left retrograde pyelogram revealed a common left ureteral sheath.  The transected ureteral stump was still patent with a blind-ending that was seen on fluoroscopy during retrograde pyelogram as well as under direct visualization on semirigid ureteroscopy. 4. There was a 2 mm left renal stone that was extracted with a tipless basket.  Indication:  Marissa Larson is a 62 y.o. female with a history of a complete duplication of her left collecting system with a common ureteral sheath.  She underwent a ureteroureteral anastomosis following injury to the lower pole ureter  during a exploratory laparoscopy for vaginal bleeding in 1992.  Over the last several months, the patient has had recurrent urinary tract infections and intermittent episodes of dull left-sided flank pain.  CT of the abdomen/pelvis from 08/27/2017 revealed a 7 mm left lower pole stone as well as a 2 to 3 mm nonobstructing stone.  There is also a calcification that is seen in the midportion of the left ureter, which I suspect is where her ureteroureteral anastomosis was performed.  She is here today for the above procedures, voices understanding and wishes to proceed.  Description of procedure:  After informed consent was obtained, the patient was brought to the operating room and general LMA anesthesia was administered. The patient was then placed in the dorsolithotomy position and prepped and draped in usual sterile fashion. A timeout was performed. A 23 French rigid cystoscope was then inserted into the urethral meatus and advanced into the bladder under direct vision. A complete bladder survey revealed no intravesical pathology.  A 5 French open-ended catheter was then inserted into the left ureteral orifice and a left retrograde pyelogram was obtained, which revealed a blind-ending left ureteral stump.  The ureteral catheter was then retracted and navigated into the patent ureteral orifice within the common sheath and a left retrograde pyelogram revealed a left ureteral duplication with no obvious filling defects along the entire course of either the upper or lower pole moieties of the left collecting system.  A Glidewire was then navigated into the lower pole moiety and left in place.  A semirigid ureteroscope was then advanced alongside the wire and up to the ureteroureteral anastomosis.  Upon complete inspection,  there was no evidence of an obstructing ureteral stone or signs of the anastomotic stricture.  The semirigid ureteroscope was then exchanged for a flexible ureteroscope, which was navigated  alongside the wire and up to the left lower pole moiety.  A complete inspection of all identifiable calyces identified a 2 mm mid pole stone, but the 7 mm stone seen on her CT scan was unable to be identified and my suspicion is that it is intraparenchymal.  The flexible scope was then retracted to the anastomosis and advanced up into the upper pole moiety, which revealed no pathology.  The scope was then navigated back into the lower pole moiety and a nitinol tipless basket was then used to extract the 2 mm stone.  The flexible ureteroscope was then removed under direct vision, identifying no evidence of ureteral trauma or residual stone.  A 6 French by 24 cm JJ stent was then advanced over the wire and into the left lower pole moiety collecting system, confirming placement via fluoroscopy.  The patient's bladder was drained.  She tolerated the procedure well and was transferred to the postanesthesia in stable condition.  Plan: Follow-up in 1 week for office cystoscopy and stent removal

## 2017-09-22 NOTE — H&P (Signed)
Urology Preoperative H&P   Chief Complaint: Recurrent UTIs and left flank pain  History of Present Illness: Marissa Larson is a 62 y.o. female with a history of pseudogout, nephrolithiasis, a duplicated left collecting system (s/p ureteroureteral anastamosis following ureteral injury during diagnostic laparoscopy in 1992) and recurrent UTIs.   Marissa Larson was seen by Vianne BullsBree Fleck, NP on 08/05/17 for left flank pain and dysuria. RUS from July showed no significant hydronephrosis or signs of obstruction. Last urine cx grew pan-sensitive E. Coli (08/05/17), which was treated with a course of doxycyline and was then converted to Macrobid. UA from 08/25/17 was clear. Today, she reports return of her dysuria and urgency/frequency as well as intermittent episodes of dull left flank pain without radiation. Denies modifying factors or N/V/F/C. AFVSS today.   -Currently off of allopurinol and chochicine due to side effects (GI upset and peripheral neuropathy, respectively).   09/17/17:  Marissa Larson presents back to clinic for follow-up of her left flank pain and urinary symptoms. She had a CT stone study on 08/27/2017 that was read as her having a nonobstructing left renal stone. Upon further review, I believe she may have a mid ureteral calculus in her upper pole moiety.   Today, she reports persistent left-sided flank pain that is dull and intermittent in nature. She denies radiation of her pain, but states it is associated with urinary urgency/frequency. She states that she feels like she has a bladder infection today and reports ongoing urinary urgency/frequency and suprapubic pressure. UA from today is consistent with acute cystitis. She denies nausea/vomiting, fever/chills, dysuria or hematuria.    Past Medical History:  Diagnosis Date  . Arthritis   . Chest pain 06/2013   Left sided  . Chronic leg pain 06/2013  . Complication of anesthesia   . Dysrhythmia   . Fatigue   . GERD (gastroesophageal reflux disease)    . History of kidney stones   . Hypertension   . Hypothyroidism   . Nonproductive cough 06/2013  . Overweight 01/18/2014  . Peripheral neuropathy   . PONV (postoperative nausea and vomiting)   . Sleep difficulties    Change in sleep patterns  . SOB (shortness of breath) 06/2013  . Tachycardia 06/2013  . Thyroid disease    hypo  . UTI (lower urinary tract infection)     Past Surgical History:  Procedure Laterality Date  . ABDOMINAL SURGERY  1994  . FOOT SURGERY Right 2009  . KIDNEY SURGERY Left 1993  . KNEE SURGERY     Multiple knee surgeries  . OOPHORECTOMY Left 1994  . TONSILLECTOMY    . URETEROSCOPY VIA URETEROSTOMY Left 1994    Allergies:  Allergies  Allergen Reactions  . Penicillins Hives and Itching    Has patient had a PCN reaction causing immediate rash, facial/tongue/throat swelling, SOB or lightheadedness with hypotension: No Has patient had a PCN reaction causing severe rash involving mucus membranes or skin necrosis: No Has patient had a PCN reaction that required hospitalization: No Has patient had a PCN reaction occurring within the last 10 years: No If all of the above answers are "NO", then may proceed with Cephalosporin use.   . Sulfa Antibiotics Hives and Itching    Bactrim    Family History  Problem Relation Age of Onset  . Migraines Other        Fam Hx   . Heart disease Other        Fam Hx  . Thyroid disease Other  Fam Hx - She also has thyroid disease  . Diabetes Other        Fam Hx  . Arthritis Other        Fam Hx  . Gallbladder disease Other        Fam Hx  . Cancer Other        Fam Hx - Pancreatic  . Hypertension Other        Fam Hx  . Heart attack Cousin        Multiple cousins with MI's before 1 years of age  . Heart disease Mother 41  . Hyperlipidemia Mother 71  . Hypertension Mother 39  . Stroke Mother 33  . Heart disease Maternal Grandmother   . Cancer Father 43       pancreatic  . Diabetes Brother 50  . Heart attack  Maternal Uncle   . Heart attack Paternal Aunt     Social History:  reports that she has never smoked. She has never used smokeless tobacco. She reports that she does not drink alcohol or use drugs.  ROS: A complete review of systems was performed.  All systems are negative except for pertinent findings as noted.  Physical Exam:  Vital signs in last 24 hours: Temp:  [98.1 F (36.7 C)] 98.1 F (36.7 C) (09/17 0900) Pulse Rate:  [69] 69 (09/17 0900) Resp:  [18] 18 (09/17 0900) BP: (137)/(80) 137/80 (09/17 0900) SpO2:  [96 %] 96 % (09/17 0900) Weight:  [83.1 kg] 83.1 kg (09/17 0927) Constitutional:  Alert and oriented, No acute distress Cardiovascular: Regular rate and rhythm, No JVD Respiratory: Normal respiratory effort, Lungs clear bilaterally GI: Abdomen is soft, nontender, nondistended, no abdominal masses GU: No CVA tenderness Lymphatic: No lymphadenopathy Neurologic: Grossly intact, no focal deficits Psychiatric: Normal mood and affect  Laboratory Data:  Recent Labs    09/21/17 0805  WBC 6.5  HGB 15.3*  HCT 44.6  PLT 255    Recent Labs    09/21/17 0805  NA 141  K 3.8  CL 103  GLUCOSE 110*  BUN 14  CALCIUM 9.7  CREATININE 0.44     No results found for this or any previous visit (from the past 24 hour(s)). No results found for this or any previous visit (from the past 240 hour(s)).  Renal Function: Recent Labs    09/21/17 0805  CREATININE 0.44   Estimated Creatinine Clearance: 81.2 mL/min (by C-G formula based on SCr of 0.44 mg/dL).  Radiologic Imaging: No results found.  I independently reviewed the above imaging studies.  Assessment and Plan Marissa Larson is a 62 y.o. female with a duplicated left collecting system s/p ureteroureteral anastamsosis following ureteral trauma during Gyn exploratory laparoscopy, recurrent UTIs, left flank pain, a left renal stone and a suspected left ureteral calcification.   The risks, benefits and alternatives  of cystoscopy with LEFT ureteroscopy, laser lithotripsy and ureteral stent placement was discussed the patient.  Risks included, but are not limited to: bleeding, urinary tract infection, ureteral injury/avulsion, ureteral stricture formation, retained stone fragments, the possibility that multiple surgeries may be required to treat the stone(s), MI, stroke, PE and the inherent risks of general anesthesia.  The patient voices understanding and wishes to proceed.      Rhoderick Moody, MD 09/22/2017, 10:14 AM  [facility] Pager: [phone]

## 2017-09-22 NOTE — Progress Notes (Signed)
May give B&O suppository per Dr Liliane ShiWinter

## 2017-09-22 NOTE — Anesthesia Procedure Notes (Signed)
Procedure Name: LMA Insertion Date/Time: 09/22/2017 10:48 AM Performed by: Vanessa Durhamochran, Porschea Borys Glenn, CRNA Pre-anesthesia Checklist: Emergency Drugs available, Patient identified, Suction available and Patient being monitored Patient Re-evaluated:Patient Re-evaluated prior to induction Oxygen Delivery Method: Circle system utilized Preoxygenation: Pre-oxygenation with 100% oxygen Induction Type: IV induction Ventilation: Mask ventilation without difficulty LMA: LMA inserted LMA Size: 4.0 Number of attempts: 1 Placement Confirmation: positive ETCO2 and breath sounds checked- equal and bilateral Tube secured with: Tape Dental Injury: Teeth and Oropharynx as per pre-operative assessment

## 2017-09-22 NOTE — Anesthesia Postprocedure Evaluation (Signed)
Anesthesia Post Note  Patient: Marissa Larson  Procedure(s) Performed: CYSTOSCOPY/RETROGRADE/URETEROSCOPY/ BASKET STONE EXTRACTION/STENT PLACEMENT (Left Ureter)     Patient location during evaluation: PACU Anesthesia Type: General Level of consciousness: awake and alert Pain management: pain level controlled Vital Signs Assessment: post-procedure vital signs reviewed and stable Respiratory status: spontaneous breathing, nonlabored ventilation, respiratory function stable and patient connected to nasal cannula oxygen Cardiovascular status: blood pressure returned to baseline and stable Postop Assessment: no apparent nausea or vomiting Anesthetic complications: no Comments: See my progress note for PACU care. Dr Bridgette HabermannMillar OKed Pt D/C.    Last Vitals:  Vitals:   09/22/17 1430 09/22/17 1504  BP:  (!) 153/82  Pulse:    Resp:  18  Temp:  (!) 36.4 C  SpO2: 93% 92%    Last Pain:  Vitals:   09/22/17 1400  TempSrc:   PainSc: 0-No pain                 Marissa Larson DAVID

## 2017-09-22 NOTE — Anesthesia Preprocedure Evaluation (Signed)
Anesthesia Evaluation  Patient identified by MRN, date of birth, ID band Patient awake    Reviewed: Allergy & Precautions, NPO status , Patient's Chart, lab work & pertinent test results  History of Anesthesia Complications (+) PONV  Airway Mallampati: I  TM Distance: >3 FB Neck ROM: Full    Dental   Pulmonary    Pulmonary exam normal        Cardiovascular hypertension, Pt. on medications Normal cardiovascular exam     Neuro/Psych Depression    GI/Hepatic GERD  Medicated and Controlled,  Endo/Other    Renal/GU      Musculoskeletal   Abdominal   Peds  Hematology   Anesthesia Other Findings   Reproductive/Obstetrics                             Anesthesia Physical Anesthesia Plan  ASA: II  Anesthesia Plan: General   Post-op Pain Management:    Induction: Intravenous  PONV Risk Score and Plan: 4 or greater and Ondansetron, Scopolamine patch - Pre-op, Midazolam and Diphenhydramine  Airway Management Planned: LMA  Additional Equipment:   Intra-op Plan:   Post-operative Plan: Extubation in OR  Informed Consent: I have reviewed the patients History and Physical, chart, labs and discussed the procedure including the risks, benefits and alternatives for the proposed anesthesia with the patient or authorized representative who has indicated his/her understanding and acceptance.     Plan Discussed with: CRNA and Surgeon  Anesthesia Plan Comments:         Anesthesia Quick Evaluation

## 2017-09-22 NOTE — Transfer of Care (Signed)
Immediate Anesthesia Transfer of Care Note  Patient: Marissa Larson  Procedure(s) Performed: CYSTOSCOPY/RETROGRADE/URETEROSCOPY/ BASKET STONE EXTRACTION/STENT PLACEMENT (Left Ureter)  Patient Location: PACU  Anesthesia Type:General  Level of Consciousness: pateint uncooperative and confused  Airway & Oxygen Therapy: Patient Spontanous Breathing and Patient connected to face mask  Post-op Assessment: Report given to RN and Post -op Vital signs reviewed and stable  Post vital signs: Reviewed and stable  Last Vitals:  Vitals Value Taken Time  BP 116/60 09/22/2017 12:30 PM  Temp    Pulse 88 09/22/2017 12:34 PM  Resp    SpO2 84 % 09/22/2017 12:34 PM  Vitals shown include unvalidated device data.  Last Pain:  Vitals:   09/22/17 0928  TempSrc:   PainSc: 2       Patients Stated Pain Goal: 4 (09/22/17 [id number])  Complications: No apparent anesthesia complications

## 2017-09-22 NOTE — Progress Notes (Signed)
PACU NOTE  Pt woke up from GA agitated, pulled her IV out complaining of needing to urinate. Versed 2mg , Precidex 20mg  in divided doses and Fentanyl 50mcg given to sedate her. IV restarted, Four point restraints placed, Scopolamine patch removed and Pt finally calmed down.  Needed an oral airway to maintain airway. Sat to 95% on face mask.   Half an hour later, Pt calm, sedate, comfortable and lucid.  Plan: To observe and if awake and alert to be discharged home.  Arta BruceKevin Monick Rena MD

## 2017-09-23 ENCOUNTER — Encounter (HOSPITAL_COMMUNITY): Payer: Self-pay | Admitting: Urology

## 2017-09-24 ENCOUNTER — Encounter: Payer: BLUE CROSS/BLUE SHIELD | Admitting: Neurology

## 2017-09-29 DIAGNOSIS — N202 Calculus of kidney with calculus of ureter: Secondary | ICD-10-CM | POA: Diagnosis not present

## 2017-09-29 DIAGNOSIS — Q625 Duplication of ureter: Secondary | ICD-10-CM | POA: Diagnosis not present

## 2017-09-29 DIAGNOSIS — N3020 Other chronic cystitis without hematuria: Secondary | ICD-10-CM | POA: Diagnosis not present

## 2017-10-05 ENCOUNTER — Other Ambulatory Visit (HOSPITAL_COMMUNITY): Payer: Self-pay | Admitting: Urology

## 2017-10-05 DIAGNOSIS — Q625 Duplication of ureter: Secondary | ICD-10-CM

## 2017-10-05 DIAGNOSIS — N3020 Other chronic cystitis without hematuria: Secondary | ICD-10-CM

## 2017-10-07 ENCOUNTER — Institutional Professional Consult (permissible substitution): Payer: BLUE CROSS/BLUE SHIELD | Admitting: Neurology

## 2017-10-14 DIAGNOSIS — N202 Calculus of kidney with calculus of ureter: Secondary | ICD-10-CM | POA: Diagnosis not present

## 2017-10-22 ENCOUNTER — Ambulatory Visit (INDEPENDENT_AMBULATORY_CARE_PROVIDER_SITE_OTHER): Payer: BLUE CROSS/BLUE SHIELD | Admitting: Neurology

## 2017-10-22 DIAGNOSIS — R202 Paresthesia of skin: Secondary | ICD-10-CM | POA: Diagnosis not present

## 2017-10-22 DIAGNOSIS — M79673 Pain in unspecified foot: Secondary | ICD-10-CM

## 2017-10-22 DIAGNOSIS — R7309 Other abnormal glucose: Secondary | ICD-10-CM | POA: Diagnosis not present

## 2017-10-22 DIAGNOSIS — G629 Polyneuropathy, unspecified: Secondary | ICD-10-CM

## 2017-10-22 DIAGNOSIS — M79643 Pain in unspecified hand: Secondary | ICD-10-CM

## 2017-10-22 DIAGNOSIS — Z0289 Encounter for other administrative examinations: Secondary | ICD-10-CM

## 2017-10-22 NOTE — Procedures (Signed)
Full Name: Marissa Larson Gender: Female MRN #: [id number] Date of Birth: [birth year]    Visit Date: 10/22/17 08:14 Age: 62 Years 4 Months Old Examining Physician: Naomie Dean, MD  Referring Physician: Sherron Ales PA, Thomasene Lot DO  History: 62 year old patient with paresthesias in the feet and hands. She has been on Colchicine daily, this may be the cause however need a thorough evaluation.  Summary: EMG/NCS was performed on the bilateral upper extremities and left lower extremity.  All nerves and muscles (as detailed in the following tables) were within normal limits.   Conclusion: This is a normal study. However this test does not rule out small-fiber neuropathy.   Brett Albino M.D.  [facility] [address] [place], [place] [address] Tel: [phone] Fax: [phone]   CC: Sherron Ales PA, Thomasene Lot DO     [facility]    Nerve / Sites Muscle Latency Ref. Amplitude Ref. Rel Amp Segments Distance Velocity Ref. Area    ms ms mV mV %  cm m/s m/s mVms  L Median - APB     Wrist APB 3.1 ?4.4 4.7 ?4.0 100 Wrist - APB 7   18.9     Upper arm APB 6.9  4.1  88.7 Upper arm - Wrist 20 52 ?49 16.4  R Median - APB     Wrist APB 3.0 ?4.4 6.3 ?4.0 100 Wrist - APB 7   25.4     Upper arm APB 6.7  6.2  97.3 Upper arm - Wrist 20 55 ?49 25.3  L Ulnar - ADM     Wrist ADM 3.1 ?3.3 6.9 ?6.0 100 Wrist - ADM 7   21.1     B.Elbow ADM 6.1  6.9  100 B.Elbow - Wrist 20 65 ?49 20.5     A.Elbow ADM 7.8  6.5  94.7 A.Elbow - B.Elbow 10 60 ?49 20.1         A.Elbow - Wrist      R Ulnar - ADM     Wrist ADM 2.8 ?3.3 6.7 ?6.0 100 Wrist - ADM 7   20.3     B.Elbow ADM 6.0  6.8  101 B.Elbow - Wrist 20 63 ?49 20.3     A.Elbow ADM 7.9  6.4  94.8 A.Elbow - B.Elbow 12 64 ?49 19.3         A.Elbow - Wrist      L Peroneal - EDB     Ankle EDB 5.8 ?6.5 6.2 ?2.0 100 Ankle - EDB 9   27.0     Fib head EDB 12.2  5.6  89.9 Fib head - Ankle 30 46 ?44 25.4     Pop fossa EDB 14.3  6.0  108  Pop fossa - Fib head 10 49 ?44 28.6         Pop fossa - Ankle      L Tibial - AH     Ankle AH 5.0 ?5.8 11.6 ?4.0 100 Ankle - AH 9   49.0     Pop fossa AH 13.9  8.8  75.7 Pop fossa - Ankle 38 43 ?41 45.0                 SNC    Nerve / Sites Rec. Site Peak Lat Ref.  Amp Ref. Segments Distance Peak Diff Ref.    ms ms V V  cm ms ms  L Sural - Ankle (Calf)  Calf Ankle 3.8 ?4.4 15 ?6 Calf - Ankle 14    L Superficial peroneal - Ankle     Lat leg Ankle 4.1 ?4.4 6 ?6 Lat leg - Ankle 14    L Median, Ulnar - Transcarpal comparison     Median Palm Wrist 2.1 ?2.2 67 ?35 Median Palm - Wrist 8       Ulnar Palm Wrist 2.1 ?2.2 31 ?12 Ulnar Palm - Wrist 8          Median Palm - Ulnar Palm  0.0 ?0.4  R Median, Ulnar - Transcarpal comparison     Median Palm Wrist 2.0 ?2.2 73 ?35 Median Palm - Wrist 8       Ulnar Palm Wrist 1.9 ?2.2 20 ?12 Ulnar Palm - Wrist 8          Median Palm - Ulnar Palm  0.2 ?0.4  L Median - Orthodromic (Dig II, Mid palm)     Dig II Wrist 2.9 ?3.4 34 ?10 Dig II - Wrist 13    R Median - Orthodromic (Dig II, Mid palm)     Dig II Wrist 2.8 ?3.4 28 ?10 Dig II - Wrist 13    L Ulnar - Orthodromic, (Dig V, Mid palm)     Dig V Wrist 2.4 ?3.1 13 ?5 Dig V - Wrist 11    R Ulnar - Orthodromic, (Dig V, Mid palm)     Dig V Wrist 2.5 ?3.1 12 ?5 Dig V - Wrist 36                       F  Wave    Nerve F Lat Ref.   ms ms  L Tibial - AH 50.4 ?56.0  L Ulnar - ADM 24.9 ?32.0  R Ulnar - ADM 24.8 ?32.0           EMG full       EMG Summary Table    Spontaneous MUAP Recruitment  Muscle IA Fib PSW Fasc Other Amp Dur. Poly Pattern  L. Vastus medialis Normal None None None _______ Normal Normal Normal Normal  L. Gastrocnemius (Medial head) Normal None None None _______ Normal Normal Normal Normal  L. Tibialis anterior Normal None None None _______ Normal Normal Normal Normal  L. Extensor hallucis longus Normal None None None _______ Normal Normal Normal Normal  L. Abductor hallucis Normal  None None None _______ Normal Normal Normal Normal  L. Deltoid Normal None None None _______ Normal Normal Normal Normal  L. Triceps brachii Normal None None None _______ Normal Normal Normal Normal  L. Pronator teres Normal None None None _______ Normal Normal Normal Normal  L. First dorsal interosseous Normal None None None _______ Normal Normal Normal Normal  L. Opponens pollicis Normal None None None _______ Normal Normal Normal Normal

## 2017-10-22 NOTE — Progress Notes (Signed)
      Full Name: Marissa Larson Gender: Female MRN #: [id number] Date of Birth: 02/02/1955    Visit Date: 10/22/17 08:14 Age: 62 Years 4 Months Old Examining Physician: Antonia Ahern, MD  Referring Physician: Dale, Taylor PA, Opalski, Deborah DO  History: 62 year old patient with paresthesias in the feet and hands. She has been on Colchicine daily, this may be the cause however need a thorough evaluation.  Summary: EMG/NCS was performed on the bilateral upper extremities and left lower extremity.  All nerves and muscles (as detailed in the following tables) were within normal limits.   Conclusion: This is a normal study. However this test does not rule out small-fiber neuropathy.   Antoni Ahern M.D.  [facility] [address] [place], [place] [address] Tel: [phone] Fax: [phone]   CC: Dale, Taylor PA, Opalski, Deborah DO     MNC    Nerve / Sites Muscle Latency Ref. Amplitude Ref. Rel Amp Segments Distance Velocity Ref. Area    ms ms mV mV %  cm m/s m/s mVms  L Median - APB     Wrist APB 3.1 ?4.4 4.7 ?4.0 100 Wrist - APB 7   18.9     Upper arm APB 6.9  4.1  88.7 Upper arm - Wrist 20 52 ?49 16.4  R Median - APB     Wrist APB 3.0 ?4.4 6.3 ?4.0 100 Wrist - APB 7   25.4     Upper arm APB 6.7  6.2  97.3 Upper arm - Wrist 20 55 ?49 25.3  L Ulnar - ADM     Wrist ADM 3.1 ?3.3 6.9 ?6.0 100 Wrist - ADM 7   21.1     B.Elbow ADM 6.1  6.9  100 B.Elbow - Wrist 20 65 ?49 20.5     A.Elbow ADM 7.8  6.5  94.7 A.Elbow - B.Elbow 10 60 ?49 20.1         A.Elbow - Wrist      R Ulnar - ADM     Wrist ADM 2.8 ?3.3 6.7 ?6.0 100 Wrist - ADM 7   20.3     B.Elbow ADM 6.0  6.8  101 B.Elbow - Wrist 20 63 ?49 20.3     A.Elbow ADM 7.9  6.4  94.8 A.Elbow - B.Elbow 12 64 ?49 19.3         A.Elbow - Wrist      L Peroneal - EDB     Ankle EDB 5.8 ?6.5 6.2 ?2.0 100 Ankle - EDB 9   27.0     Fib head EDB 12.2  5.6  89.9 Fib head - Ankle 30 46 ?44 25.4     Pop fossa EDB 14.3  6.0  108  Pop fossa - Fib head 10 49 ?44 28.6         Pop fossa - Ankle      L Tibial - AH     Ankle AH 5.0 ?5.8 11.6 ?4.0 100 Ankle - AH 9   49.0     Pop fossa AH 13.9  8.8  75.7 Pop fossa - Ankle 38 43 ?41 45.0                 SNC    Nerve / Sites Rec. Site Peak Lat Ref.  Amp Ref. Segments Distance Peak Diff Ref.    ms ms V V  cm ms ms  L Sural - Ankle (Calf)       Calf Ankle 3.8 ?4.4 15 ?6 Calf - Ankle 14    L Superficial peroneal - Ankle     Lat leg Ankle 4.1 ?4.4 6 ?6 Lat leg - Ankle 14    L Median, Ulnar - Transcarpal comparison     Median Palm Wrist 2.1 ?2.2 67 ?35 Median Palm - Wrist 8       Ulnar Palm Wrist 2.1 ?2.2 31 ?12 Ulnar Palm - Wrist 8          Median Palm - Ulnar Palm  0.0 ?0.4  R Median, Ulnar - Transcarpal comparison     Median Palm Wrist 2.0 ?2.2 73 ?35 Median Palm - Wrist 8       Ulnar Palm Wrist 1.9 ?2.2 20 ?12 Ulnar Palm - Wrist 8          Median Palm - Ulnar Palm  0.2 ?0.4  L Median - Orthodromic (Dig II, Mid palm)     Dig II Wrist 2.9 ?3.4 34 ?10 Dig II - Wrist 13    R Median - Orthodromic (Dig II, Mid palm)     Dig II Wrist 2.8 ?3.4 28 ?10 Dig II - Wrist 13    L Ulnar - Orthodromic, (Dig V, Mid palm)     Dig V Wrist 2.4 ?3.1 13 ?5 Dig V - Wrist 11    R Ulnar - Orthodromic, (Dig V, Mid palm)     Dig V Wrist 2.5 ?3.1 12 ?5 Dig V - Wrist 11                       F  Wave    Nerve F Lat Ref.   ms ms  L Tibial - AH 50.4 ?56.0  L Ulnar - ADM 24.9 ?32.0  R Ulnar - ADM 24.8 ?32.0           EMG full       EMG Summary Table    Spontaneous MUAP Recruitment  Muscle IA Fib PSW Fasc Other Amp Dur. Poly Pattern  L. Vastus medialis Normal None None None _______ Normal Normal Normal Normal  L. Gastrocnemius (Medial head) Normal None None None _______ Normal Normal Normal Normal  L. Tibialis anterior Normal None None None _______ Normal Normal Normal Normal  L. Extensor hallucis longus Normal None None None _______ Normal Normal Normal Normal  L. Abductor hallucis Normal  None None None _______ Normal Normal Normal Normal  L. Deltoid Normal None None None _______ Normal Normal Normal Normal  L. Triceps brachii Normal None None None _______ Normal Normal Normal Normal  L. Pronator teres Normal None None None _______ Normal Normal Normal Normal  L. First dorsal interosseous Normal None None None _______ Normal Normal Normal Normal  L. Opponens pollicis Normal None None None _______ Normal Normal Normal Normal    

## 2017-10-22 NOTE — Progress Notes (Signed)
See procedure note.

## 2017-10-22 NOTE — Patient Instructions (Signed)
Next steps would include: Skin biopsy for small fiber neuropathy, genetic testing for hereditary neuropathies, stopping colchicine and monitoring clinically, 2-hour glucose tolerance test.    Peripheral Neuropathy Peripheral neuropathy is a type of nerve damage. It affects nerves that carry signals between the spinal cord and other parts of the body. These are called peripheral nerves. With peripheral neuropathy, one nerve or a group of nerves may be damaged. What are the causes? Many things can damage peripheral nerves. For some people with peripheral neuropathy, the cause is unknown. Some causes include:  Diabetes. This is the most common cause of peripheral neuropathy.  Injury to a nerve.  Pressure or stress on a nerve that lasts a long time.  Too little vitamin B. Alcoholism can lead to this.  Infections.  Autoimmune diseases, such as multiple sclerosis and systemic lupus erythematosus.  Inherited nerve diseases.  Some medicines, such as cancer drugs.  Toxic substances, such as lead and mercury.  Too little blood flowing to the legs.  Kidney disease.  Thyroid disease.  What are the signs or symptoms? Different people have different symptoms. The symptoms you have will depend on which of your nerves is damaged. Common symptoms include:  Loss of feeling (numbness) in the feet and hands.  Tingling in the feet and hands.  Pain that burns.  Very sensitive skin.  Weakness.  Not being able to move a part of the body (paralysis).  Muscle twitching.  Clumsiness or poor coordination.  Loss of balance.  Not being able to control your bladder.  Feeling dizzy.  Sexual problems.  How is this diagnosed? Peripheral neuropathy is a symptom, not a disease. Finding the cause of peripheral neuropathy can be hard. To figure that out, your health care provider will take a medical history and do a physical exam. A neurological exam will also be done. This involves checking  things affected by your brain, spinal cord, and nerves (nervous system). For example, your health care provider will check your reflexes, how you move, and what you can feel. Other types of tests may also be ordered, such as:  Blood tests.  A test of the fluid in your spinal cord.  Imaging tests, such as CT scans or an MRI.  Electromyography (EMG). This test checks the nerves that control muscles.  Nerve conduction velocity tests. These tests check how fast messages pass through your nerves.  Nerve biopsy. A small piece of nerve is removed. It is then checked under a microscope.  How is this treated?  Medicine is often used to treat peripheral neuropathy. Medicines may include: ? Pain-relieving medicines. Prescription or over-the-counter medicine may be suggested. ? Antiseizure medicine. This may be used for pain. ? Antidepressants. These also may help ease pain from neuropathy. ? Lidocaine. This is a numbing medicine. You might wear a patch or be given a shot. ? Mexiletine. This medicine is typically used to help control irregular heart rhythms.  Surgery. Surgery may be needed to relieve pressure on a nerve or to destroy a nerve that is causing pain.  Physical therapy to help movement.  Assistive devices to help movement. Follow these instructions at home:  Only take over-the-counter or prescription medicines as directed by your health care provider. Follow the instructions carefully for any given medicines. Do not take any other medicines without first getting approval from your health care provider.  If you have diabetes, work closely with your health care provider to keep your blood sugar under control.  If you  have numbness in your feet: ? Check every day for signs of injury or infection. Watch for redness, warmth, and swelling. ? Wear padded socks and comfortable shoes. These help protect your feet.  Do not do things that put pressure on your damaged nerve.  Do not  smoke. Smoking keeps blood from getting to damaged nerves.  Avoid or limit alcohol. Too much alcohol can cause a lack of B vitamins. These vitamins are needed for healthy nerves.  Develop a good support system. Coping with peripheral neuropathy can be stressful. Talk to a mental health specialist or join a support group if you are struggling.  Follow up with your health care provider as directed. Contact a health care provider if:  You have new signs or symptoms of peripheral neuropathy.  You are struggling emotionally from dealing with peripheral neuropathy.  You have a fever. Get help right away if:  You have an injury or infection that is not healing.  You feel very dizzy or begin vomiting.  You have chest pain.  You have trouble breathing. This information is not intended to replace advice given to you by your health care provider. Make sure you discuss any questions you have with your health care provider. Document Released: 12/13/2001 Document Revised: 05/31/2015 Document Reviewed: 08/30/2012 Elsevier Interactive Patient Education  2017 [employer].

## 2017-10-22 NOTE — Progress Notes (Addendum)
History: 62 year old patient with paresthesias in the feet and hands. She has been on Colchicine daily, this may be the cause however need a thorough evaluation. Discussed in detail with patient and husband. She has very flat feet, hammer toes, no tapering legs. Extensive lab evaluation unremarkable, she has been evaluated by rheumatology as well. Reviewed extensive normal lab tests with patient. Next steps would include: Skin biopsy for small fiber neuropathy, genetic testing for hereditary neuropathies, stopping colchicine and monitoring clinically, 2-hour glucose tolerance test. Labs ordered at last appointment normal include:   . B. burgdorfi Antibody  . Methylmalonic acid, serum  . Vitamin B1  . ANA  . ANA, IFA (with reflex)  . CYCLIC CITRUL PEPTIDE ANTIBODY, IGG/IGA  . Angiotensin converting enzyme  . Sjogren's syndrome antibods(ssa + ssb)  . B12 and Folate Panel  . Hepatitis C antibody  . Tissue transglutaminase, IgA  . Gliadin antibodies, serum  . Heavy metals, blood  . Rheumatoid factor  . Vitamin B6  . Multiple Myeloma Panel (SPEP&IFE w/QIG)  .    Also scheduled small-fiber biopsy on 10/28 with Dr. Krista Blue. Provided information on small-fiber neuropathy. And coordinated with our lab to schedule patient for her 2-hour glucose tolerance test. She has the genetic test kit.  Orders Placed This Encounter  Procedures  . Glucose tolerance, 2 hours     A total of 40 minutes was spent face-to-face with this patient. Over half this time was spent on counseling patient on the  1. Small fiber neuropathy   2. Hand and foot pain, unspecified laterality   3. Paresthesias   4. Elevated glucose    diagnosis and different diagnostic and therapeutic options, counseling and coordination of care, risks ans benefits of management, compliance, or risk factor reduction and education.  This does not include time spent on EMG/NCS.

## 2017-10-22 NOTE — Progress Notes (Deleted)
Office Visit Note  Patient: Marissa Larson             Date of Birth: Jul 09, 1955           MRN: [id number]             PCP: Marissa Lot, DO Referring: Marissa Lot, DO Visit Date: 11/05/2017 Occupation: @GUAROCC @  Subjective:  No chief complaint on file.   History of Present Illness: Marissa Larson is a 62 y.o. female ***   Activities of Daily Living:  Patient reports morning stiffness for *** {minute/hour:19697}.   Patient {ACTIONS;DENIES/REPORTS:21021675::"Denies"} nocturnal pain.  Difficulty dressing/grooming: {ACTIONS;DENIES/REPORTS:21021675::"Denies"} Difficulty climbing stairs: {ACTIONS;DENIES/REPORTS:21021675::"Denies"} Difficulty getting out of chair: {ACTIONS;DENIES/REPORTS:21021675::"Denies"} Difficulty using hands for taps, buttons, cutlery, and/or writing: {ACTIONS;DENIES/REPORTS:21021675::"Denies"}  No Rheumatology ROS completed.   PMFS History:  Patient Active Problem List   Diagnosis Date Noted  . Peripheral neuropathy 08/18/2017  . 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 Obesity, Class I, BMI 30-34.9 12/03/2016  . Kidney stones 09/12/2016  . Nausea 09/12/2016  . High risk medication use 07/30/2016  . NAFLD (nonalcoholic fatty liver disease) 40/98/1191  . Primary osteoarthritis of both feet 04/12/2016  . History of kidney stones 04/12/2016  . Primary osteoarthritis of both hands 04/11/2016  . Bilateral primary osteoarthritis of hip 04/11/2016  . Primary osteoarthritis of both knees 04/11/2016  . Spondylosis of lumbar region without myelopathy or radiculopathy 04/11/2016  . Hyperuricemia 04/11/2016  . Primary insomnia 04/11/2016  . Flank pain 04/02/2016  . Sinusitis 04/02/2016  . Elevated HDL 11/11/2015  . Elevated LFTs 11/11/2015  . Adjustment disorder with mixed anxiety and depressed mood 11/11/2015  . Dysuria 11/11/2015  . Hypothyroidism  10/14/2015  . Pseudogout involving multiple joints 10/14/2015  . Vitamin D deficiency 10/14/2015  . Hormone replacement therapy- per GYN 10/14/2015  . HTN (hypertension) 10/14/2015  . Fatigue 10/14/2015  . h/o Hiatal hernia 10/14/2015  . Sleep difficulties 10/14/2015  . Generalized OA 10/14/2015  . Counseling on health promotion and disease prevention 10/14/2015  . 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 Age-related nuclear cataract of both eyes 01/25/2015  . Posterior vitreous detachment of left eye 01/25/2015  . chronic Palpitations 07/19/2013    Past Medical History:  Diagnosis Date  . Arthritis   . Chest pain 06/2013   Left sided  . Chronic leg pain 06/2013  . Complication of anesthesia   . Dysrhythmia   . Fatigue   . GERD (gastroesophageal reflux disease)   . History of kidney stones   . Hypertension   . Hypothyroidism   . Nonproductive cough 06/2013  . Overweight 01/18/2014  . Peripheral neuropathy   . PONV (postoperative nausea and vomiting)   . Sleep difficulties    Change in sleep patterns  . SOB (shortness of breath) 06/2013  . Tachycardia 06/2013  . Thyroid disease    hypo  . UTI (lower urinary tract infection)     Family History  Problem Relation Age of Onset  . Migraines Other        Fam Hx   . Heart disease Other        Fam Hx  . Thyroid disease Other        Fam Hx - She also has thyroid disease  . Diabetes Other        Fam Hx  . Arthritis Other        Fam  Hx  . Gallbladder disease Other        Fam Hx  . Cancer Other        Fam Hx - Pancreatic  . Hypertension Other        Fam Hx  . Heart attack Cousin        Multiple cousins with MI's before 57 years of age  . Heart disease Mother 67  . Hyperlipidemia Mother 87  . Hypertension Mother 28  . Stroke Mother 4  . Heart disease Maternal Grandmother   . Cancer Father 70       pancreatic  . Diabetes Brother 50  . Heart attack Maternal Uncle   . Heart attack Paternal Aunt    Past Surgical  History:  Procedure Laterality Date  . ABDOMINAL SURGERY  1994  . CYSTOSCOPY/URETEROSCOPY/HOLMIUM LASER/STENT PLACEMENT Left 09/22/2017   Procedure: CYSTOSCOPY/RETROGRADE/URETEROSCOPY/ BASKET STONE EXTRACTION/STENT PLACEMENT;  Surgeon: Rene Paci, MD;  Location: [facility];  Service: Urology;  Laterality: Left;  ONLY NEEDS 60 MIN  . FOOT SURGERY Right 2009  . KIDNEY SURGERY Left 1993  . KNEE SURGERY     Multiple knee surgeries  . OOPHORECTOMY Left 1994  . TONSILLECTOMY    . URETEROSCOPY VIA URETEROSTOMY Left 1994   Social History   Social History Narrative  . Not on file    Objective: Vital Signs: There were no vitals taken for this visit.   Physical Exam   Musculoskeletal Exam: ***  CDAI Exam: CDAI Score: Not documented Patient Global Assessment: Not documented; Provider Global Assessment: Not documented Swollen: Not documented; Tender: Not documented Joint Exam   Not documented   There is currently no information documented on the homunculus. Go to the Rheumatology activity and complete the homunculus joint exam.  Investigation: No additional findings.  Imaging: Dg C-arm 1-60 Min-no Report  Result Date: 09/22/2017 Fluoroscopy was utilized by the requesting physician.  No radiographic interpretation.    Recent Labs: Lab Results  Component Value Date   WBC 6.5 09/21/2017   HGB 15.3 (H) 09/21/2017   PLT 255 09/21/2017   NA 141 09/21/2017   K 3.8 09/21/2017   CL 103 09/21/2017   CO2 28 09/21/2017   GLUCOSE 110 (H) 09/21/2017   BUN 14 09/21/2017   CREATININE 0.44 09/21/2017   BILITOT 0.4 06/10/2017   ALKPHOS 78 06/10/2017   AST 34 06/10/2017   ALT 41 (H) 06/10/2017   PROT 6.6 08/17/2017   ALBUMIN 4.6 06/10/2017   CALCIUM 9.7 09/21/2017   GFRAA >60 09/21/2017   QFTBGOLDPLUS NEGATIVE 02/16/2017    Speciality Comments: No specialty comments available.  Procedures:  No procedures performed Allergies: Penicillins and Sulfa antibiotics    Assessment / Plan:     Visit Diagnoses: No diagnosis found.   Orders: No orders of the defined types were placed in this encounter.  No orders of the defined types were placed in this encounter.   Face-to-face time spent with patient was *** minutes. Greater than 50% of time was spent in counseling and coordination of care.  Follow-Up Instructions: No follow-ups on file.   Ellen Henri, CMA  Note - This record has been created using Animal nutritionist.  Chart creation errors have been sought, but may not always  have been located. Such creation errors do not reflect on  the standard of medical care.

## 2017-10-23 ENCOUNTER — Other Ambulatory Visit (INDEPENDENT_AMBULATORY_CARE_PROVIDER_SITE_OTHER): Payer: Self-pay

## 2017-10-23 DIAGNOSIS — R945 Abnormal results of liver function studies: Secondary | ICD-10-CM | POA: Diagnosis not present

## 2017-10-23 DIAGNOSIS — R7309 Other abnormal glucose: Secondary | ICD-10-CM | POA: Diagnosis not present

## 2017-10-23 DIAGNOSIS — E039 Hypothyroidism, unspecified: Secondary | ICD-10-CM | POA: Diagnosis not present

## 2017-10-23 DIAGNOSIS — Z0289 Encounter for other administrative examinations: Secondary | ICD-10-CM

## 2017-10-23 NOTE — Addendum Note (Signed)
Addended by: Naomie Dean B on: 10/23/2017 10:04 AM   Modules accepted: Orders

## 2017-10-24 LAB — GLUCOSE TOLERANCE, 2 HOURS
GLUCOSE FASTING GTT: 100 mg/dL — AB (ref 65–99)
Glucose, 2 hour: 92 mg/dL (ref 65–139)

## 2017-10-27 DIAGNOSIS — E039 Hypothyroidism, unspecified: Secondary | ICD-10-CM | POA: Diagnosis not present

## 2017-10-27 DIAGNOSIS — R1010 Upper abdominal pain, unspecified: Secondary | ICD-10-CM | POA: Diagnosis not present

## 2017-10-27 DIAGNOSIS — K760 Fatty (change of) liver, not elsewhere classified: Secondary | ICD-10-CM | POA: Diagnosis not present

## 2017-11-02 ENCOUNTER — Telehealth: Payer: Self-pay | Admitting: *Deleted

## 2017-11-02 ENCOUNTER — Ambulatory Visit: Payer: Self-pay | Admitting: Neurology

## 2017-11-02 NOTE — Telephone Encounter (Signed)
Pt called to cancel skin biopsy same day due to sickness.

## 2017-11-03 ENCOUNTER — Encounter: Payer: Self-pay | Admitting: Neurology

## 2017-11-05 ENCOUNTER — Ambulatory Visit: Payer: BLUE CROSS/BLUE SHIELD | Admitting: Physician Assistant

## 2017-11-16 ENCOUNTER — Ambulatory Visit (HOSPITAL_COMMUNITY): Payer: BLUE CROSS/BLUE SHIELD

## 2017-11-16 DIAGNOSIS — Z01419 Encounter for gynecological examination (general) (routine) without abnormal findings: Secondary | ICD-10-CM | POA: Diagnosis not present

## 2017-11-16 DIAGNOSIS — Z6832 Body mass index (BMI) 32.0-32.9, adult: Secondary | ICD-10-CM | POA: Diagnosis not present

## 2017-11-16 DIAGNOSIS — Z1231 Encounter for screening mammogram for malignant neoplasm of breast: Secondary | ICD-10-CM | POA: Diagnosis not present

## 2017-11-16 DIAGNOSIS — Z13820 Encounter for screening for osteoporosis: Secondary | ICD-10-CM | POA: Diagnosis not present

## 2017-11-17 ENCOUNTER — Other Ambulatory Visit: Payer: Self-pay | Admitting: Obstetrics & Gynecology

## 2017-11-17 ENCOUNTER — Ambulatory Visit (HOSPITAL_COMMUNITY): Payer: BLUE CROSS/BLUE SHIELD

## 2017-11-17 DIAGNOSIS — N644 Mastodynia: Secondary | ICD-10-CM

## 2017-11-20 ENCOUNTER — Other Ambulatory Visit: Payer: Self-pay | Admitting: Rheumatology

## 2017-11-20 ENCOUNTER — Other Ambulatory Visit: Payer: Self-pay | Admitting: Family Medicine

## 2017-11-20 NOTE — Telephone Encounter (Signed)
Ok to refill 

## 2017-11-20 NOTE — Telephone Encounter (Signed)
Last Visit: 06/05/17 Next Visit: 12/10/17 Labs: 09/21/17 CBC Hgb 15.3 Bmp elevated glucose  Okay to refill Colcrys?

## 2017-11-23 ENCOUNTER — Telehealth: Payer: Self-pay | Admitting: Family Medicine

## 2017-11-23 ENCOUNTER — Ambulatory Visit: Payer: BLUE CROSS/BLUE SHIELD

## 2017-11-23 ENCOUNTER — Ambulatory Visit
Admission: RE | Admit: 2017-11-23 | Discharge: 2017-11-23 | Disposition: A | Discharge status: Home / Self Care | Payer: BLUE CROSS/BLUE SHIELD | Source: Ambulatory Visit | Attending: Obstetrics & Gynecology | Admitting: Obstetrics & Gynecology

## 2017-11-23 DIAGNOSIS — R928 Other abnormal and inconclusive findings on diagnostic imaging of breast: Secondary | ICD-10-CM | POA: Diagnosis not present

## 2017-11-23 DIAGNOSIS — N644 Mastodynia: Secondary | ICD-10-CM

## 2017-11-23 NOTE — Telephone Encounter (Signed)
Noted MPulliam, CMA/RT(R)  

## 2017-11-23 NOTE — Telephone Encounter (Signed)
-----   Message from [place] CraneMelissa D Pulliam, CMA sent at 11/23/2017 11:16 AM EST ----- Patient is due follow up, please call the patient to make an appointment.  Thanks. MPulliam, CMA/RT(R)

## 2017-11-23 NOTE — Telephone Encounter (Signed)
Called patient to set up provider required OV--left message.  -forwarding FYI to medical assistant. --Fausto Skillernglh

## 2017-11-24 ENCOUNTER — Encounter

## 2017-11-24 ENCOUNTER — Encounter: Payer: Self-pay | Admitting: Neurology

## 2017-11-24 ENCOUNTER — Ambulatory Visit (INDEPENDENT_AMBULATORY_CARE_PROVIDER_SITE_OTHER): Payer: BLUE CROSS/BLUE SHIELD | Admitting: Neurology

## 2017-11-24 VITALS — BP 134/72 | HR 77 | Ht 66.0 in | Wt 189.0 lb

## 2017-11-24 DIAGNOSIS — G8929 Other chronic pain: Secondary | ICD-10-CM | POA: Insufficient documentation

## 2017-11-24 DIAGNOSIS — F5104 Psychophysiologic insomnia: Secondary | ICD-10-CM | POA: Diagnosis not present

## 2017-11-24 DIAGNOSIS — R4189 Other symptoms and signs involving cognitive functions and awareness: Secondary | ICD-10-CM | POA: Insufficient documentation

## 2017-11-24 DIAGNOSIS — G4701 Insomnia due to medical condition: Secondary | ICD-10-CM

## 2017-11-24 DIAGNOSIS — G4752 REM sleep behavior disorder: Secondary | ICD-10-CM | POA: Diagnosis not present

## 2017-11-24 DIAGNOSIS — G629 Polyneuropathy, unspecified: Secondary | ICD-10-CM

## 2017-11-24 NOTE — Progress Notes (Signed)
SLEEP MEDICINE CLINIC   Provider:  Melvyn Novas, MD  Primary Care Physician:  Thomasene Lot, DO   Referring Provider:  Dr. Lucia Gaskins., MD  Chief Complaint  Patient presents with  . New Patient (Initial Visit)    pt alone, rm 11. pt states that she has difficulty with falling asleep and staying asleep. wakes up frequently during the night. pt has been told  she snores on occasion. denies apnea events that she aware of. averages about 5-6 hrs of sleep uninterupted. never had a sleep study. she has taken some sleep aids which she has found has some opposite effects.      HPI:  Marissa Larson is a 62 y.o. female patient of Dr. Naomie Dean, seen here on 11-24-2017 for a sleep evaluation.  Chief complaint according to patient: " Lack of sleep, awful sleep - having taking sleep aids for years " . "I sleep better on gabapentin". Marissa Larson is a 62 year old Caucasian right-handed female with a long-standing history of poor sleep quality.  She was originally referred to neurology by her primary care physician and had seen Dr. 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  The tops were turned purple and red not associated with exposure to cold temperature.  The fingers are not affected and she did have starting numbness in her feet unrelated to the change in color.  Dr. Lucia Gaskins has treated the patient for a small fiber neuropathy since.  The last visit with the patient was on 17 August 2017 and she prescribed Neurontin 300 mg capsules to be taken at bedtime up to sleep at night.  But this has helped the patient to sleep better it does give her a certain grogginess that lasted well into the morning and affects her work.  She also reports that she is restless at night she wakes up all night long she moves around sometimes the sheets becomes threatbare from her frequent moving.   Before gabapentin she woke up 6-7 times each night and had morning headaches.  EMG and nerve conduction studies were suggestive of a peripheral small fiber neuropathy, the patient is pending a skin biopsy.  Sleep habits are as follows:  She has dinner with her husband, watches TV after that, usually until 9-10 PM. When she feels tired enough, she will go to bed. She may take a hot shower. The bedroom is cool, quiet and dark.  Often remains restless again , and sometimes she gets hungry and eats at night. Gabapentin may have introduced the night time hunger. She has less frequent nocturia, used to have 4-5 times at night, has a history of frequent UTI. She takes HRT, Progesteron at night, estrogen cream in AM. - may contribute to apetite. She dreams vividly each night, wakes up spontaneously before her alarm rings, at about 5 AM and stays in bed until 6 AM. 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  Coffee in AM.    Medical history ( see above )  and family sleep history: Mother had insomnia, CHF, CAD and CVAs. Maternal family were night owls.   Social history: Married, Radiation protection practitioner, no children, caffeine - only in AM has 1-2 coffees, no sodas, no teas.  ETOH - socially, non smoker.   Review of Systems: Out of a complete 14 system review, the patient complains of only the following symptoms, and all other reviewed systems are  negative. Not a snorer, restless moving, neuropathy suspected, not usually RLS- only feeling antsy, anxious.  Stress at work. No exercise.   Epworth Sleepiness Score at 4/ 24   , high Fatigue severity score at 40/ 63 points   , depression score 2.5/ 15   Social History   Socioeconomic History  . Marital status: Married    Spouse name: Not on file  . Number of children: Not on file  . Years of education: Not on file  . Highest education level: Not on file  Occupational History  . Not on file  Social Needs  . Financial resource strain: Not on file  . Food  insecurity:    Worry: Not on file    Inability: Not on file  . Transportation needs:    Medical: Not on file    Non-medical: Not on file  Tobacco Use  . Smoking status: Never Smoker  . Smokeless tobacco: Never Used  Substance and Sexual Activity  . Alcohol use: No    Frequency: Never    Comment: rarely   . Drug use: No  . Sexual activity: Yes    Birth control/protection: None  Lifestyle  . Physical activity:    Days per week: Not on file    Minutes per session: Not on file  . Stress: Not on file  Relationships  . Social connections:    Talks on phone: Not on file    Gets together: Not on file    Attends religious service: Not on file    Active member of club or organization: Not on file    Attends meetings of clubs or organizations: Not on file    Relationship status: Not on file  . Intimate partner violence:    Fear of current or ex partner: Not on file    Emotionally abused: Not on file    Physically abused: Not on file    Forced sexual activity: Not on file  Other Topics Concern  . Not on file  Social History Narrative  . Not on file    Family History  Problem Relation Age of Onset  . Migraines Other        Fam Hx   . Heart disease Other        Fam Hx  . Thyroid disease Other        Fam Hx - She also has thyroid disease  . Diabetes Other        Fam Hx  . Arthritis Other        Fam Hx  . Gallbladder disease Other        Fam Hx  . Cancer Other        Fam Hx - Pancreatic  . Hypertension Other        Fam Hx  . Heart attack Cousin        Multiple cousins with MI's before 23 years of age  . Heart disease Mother 25  . Hyperlipidemia Mother 69  . Hypertension Mother 65  . Stroke Mother 65  . Heart disease Maternal Grandmother   . Cancer Father 70       pancreatic  . Diabetes Brother 50  . Heart attack Maternal Uncle   . Heart attack Paternal Aunt     Past Medical History:  Diagnosis Date  . Arthritis   . Chest pain 06/2013   Left sided  . Chronic  leg pain 06/2013  . Complication of anesthesia   . Dysrhythmia   .  Fatigue   . GERD (gastroesophageal reflux disease)   . History of kidney stones   . Hypertension   . Hypothyroidism   . Nonproductive cough 06/2013  . Overweight 01/18/2014  . Peripheral neuropathy   . PONV (postoperative nausea and vomiting)   . Sleep difficulties    Change in sleep patterns  . SOB (shortness of breath) 06/2013  . Tachycardia 06/2013  . Thyroid disease    hypo  . UTI (lower urinary tract infection)     Past Surgical History:  Procedure Laterality Date  . ABDOMINAL SURGERY  1994  . CYSTOSCOPY/URETEROSCOPY/HOLMIUM LASER/STENT PLACEMENT Left 09/22/2017   Procedure: CYSTOSCOPY/RETROGRADE/URETEROSCOPY/ BASKET STONE EXTRACTION/STENT PLACEMENT;  Surgeon: Rene PaciWinter, Christopher Aaron, MD;  Location: [facility];  Service: Urology;  Laterality: Left;  ONLY NEEDS 60 MIN  . FOOT SURGERY Right 2009  . KIDNEY SURGERY Left 1993  . KNEE SURGERY     Multiple knee surgeries  . OOPHORECTOMY Left 1994  . TONSILLECTOMY    . URETEROSCOPY VIA URETEROSTOMY Left 1994    Current Outpatient Medications  Medication Sig Dispense Refill  . COLCRYS 0.6 MG tablet TAKE 1 TABLET BY MOUTH 2 TIMES DAILY. 180 tablet 1  . CREAM BASE EX Apply 1 application topically daily. BIEST Transdermal Cream 0.5 mg: Compounded bio-identical Estriol (E3) combined by [facility]    . escitalopram (LEXAPRO) 10 MG tablet Take 10 mg by mouth at bedtime.  2  . famotidine (PEPCID) 40 MG tablet Take 40 mg by mouth daily as needed (for heartburn/indigestion.).   2  . Flaxseed, Linseed, (FLAX SEED OIL) 1000 MG CAPS Take 2,000 mg by mouth every evening.    . gabapentin (NEURONTIN) 300 MG capsule 1-3 (300mg -900mg ) tabs at bedtime 90 capsule 11  . hydrochlorothiazide (MICROZIDE) 12.5 MG capsule Take 12.5 mg by mouth daily.     Marland Kitchen. ibuprofen (ADVIL,MOTRIN) 200 MG tablet Take 400 mg by mouth every 8 (eight) hours as needed (for pain.).    Marland Kitchen. levothyroxine  (SYNTHROID, LEVOTHROID) 112 MCG tablet Take 112 mcg by mouth daily before breakfast.    . Magnesium 500 MG CAPS Take 1,000 mg by mouth every evening.    . Misc Natural Products (TART CHERRY ADVANCED PO) Take 2,000 mg by mouth daily. 1000 mg per capsule    . NONFORMULARY OR COMPOUNDED ITEM Take 150 mg by mouth at bedtime. Bio identical Progesterone 150 ([facility])    . Omega-3 Fatty Acids (FISH OIL) 1200 MG CAPS Take 2,400 mg by mouth every evening.    . Vitamin D, Ergocalciferol, (DRISDOL) 1.25 MG (50000 UT) CAPS capsule TAKE 1 CAPSULE BY MOUTH EVERY 7 DAYS. 12 capsule 0   No current facility-administered medications for this visit.     Allergies as of 11/24/2017 - Review Complete 11/24/2017  Allergen Reaction Noted  . Penicillins Hives and Itching 06/17/2013  . Sulfa antibiotics Hives and Itching 06/17/2013    Vitals: BP 134/72   Pulse 77   Ht 5\' 6"  (1.676 m)   Wt 189 lb (85.7 kg)   BMI 30.51 kg/m  Last Weight:  Wt Readings from Last 1 Encounters:  11/24/17 189 lb (85.7 kg)   ZOX:WRUEBMI:Body mass index is 30.51 kg/m.     Last Height:   Ht Readings from Last 1 Encounters:  11/24/17 5\' 6"  (1.676 m)    Physical exam:  General: The patient is awake, alert and appears not in acute distress. The patient is well groomed. Head: Normocephalic, atraumatic. Neck is supple. Mallampati  3 ,  neck circumference: 15. Nasal airflow patent.  Cardiovascular:  Regular rate and rhythm , without  murmurs or carotid bruit, and without distended neck veins. Respiratory: Lungs are clear to auscultation. Skin:  Without evidence of edema, or rash Trunk: BMI is 30 kg/m2. The patient's posture is erect   Neurologic exam : The patient is awake and alert, oriented to place and time.   Memory subjective described as intact.   Attention span & concentration ability appears normal.  Speech is fluent,  without  dysarthria, dysphonia or aphasia.  Mood and affect are appropriate.  Cranial  nerves: Pupils are equal and briskly reactive to light. . Visual fields by finger perimetry are intact. Hearing to finger rub intact.  Facial sensation intact to fine touch. Has experienced facial numbness. Facial motor strength is symmetric and tongue and uvula move midline. Shoulder shrug was symmetrical.   Motor exam: Normal tone, muscle bulk and symmetric strength in all extremities. Sensory:  Fine touch, pinprick and vibration were tested in upper  extremities. Proprioception tested in the upper extremities was normal.  (Small fiber neuropathy).   Coordination: Rapid alternating movements in the fingers/hands was normal. Finger-to-nose maneuver  normal without evidence of ataxia, dysmetria or tremor. Gait and station: Patient walks without assistive device and is able unassisted to climb up to the exam table.  Deep tendon reflexes: in the  upper and lower extremities are symmetric and intact.   Assessment:  After physical and neurologic examination, review of laboratory studies,  Personal review of imaging studies, reports of other /same  Imaging studies, results of polysomnography and / or neurophysiology testing and pre-existing records as far as provided in visit., my assessment is:   1) Trouble to get to sleep, trouble to remain asleep. Chronic insomnia improved on Gabapentin but left patient " groggy ". gabapenting helped with nocturnal frequent urination and pain, restless movements. It has not affected her frequent dreaming.   2) no snoring , or only infrequently.  Low yield for OSA.  3) light sleeper -  Fragmented sleep- lets check for REM sleep disorder. Not a sleep walker but very vivid dreams, occasional enactment.    The patient was advised of the nature of the diagnosed disorder , the treatment options and the  risks for general health and wellness arising from not treating the condition.   I spent more than 45 minutes of face to face time with the patient.  Greater than 50%  of time was spent in counseling and coordination of care. We have discussed the diagnosis and differential and I answered the patient's questions.    Plan:  Treatment plan and additional workup :  14 days sleep boot camp. Attended sleep study with expanded EEG montage , dream stages , parasomnia.  [employer]-   Evian Derringer, MD 11/24/2017, 9:12 AM  Certified in Neurology by ABPN Certified in Sleep Medicine by [facility] [address] [place], [place] [address]

## 2017-11-24 NOTE — Addendum Note (Signed)
Addended by: Melvyn NovasHMEIER, Stuti Sandin on: 11/24/2017 09:46 AM   Modules accepted: Orders

## 2017-11-26 DIAGNOSIS — Z0289 Encounter for other administrative examinations: Secondary | ICD-10-CM

## 2017-11-26 NOTE — Progress Notes (Deleted)
Office Visit Note  Patient: Marissa Larson             Date of Birth: August 19, 1955           MRN: [id number]             PCP: Thomasene Lotpalski, Deborah, DO Referring: Thomasene Lotpalski, Deborah, DO Visit Date: 12/10/2017 Occupation: @GUAROCC @  Subjective:  No chief complaint on file.   History of Present Illness: Marissa Larson is a 62 y.o. female ***   Activities of Daily Living:  Patient reports morning stiffness for *** {minute/hour:19697}.   Patient {ACTIONS;DENIES/REPORTS:21021675::"Denies"} nocturnal pain.  Difficulty dressing/grooming: {ACTIONS;DENIES/REPORTS:21021675::"Denies"} Difficulty climbing stairs: {ACTIONS;DENIES/REPORTS:21021675::"Denies"} Difficulty getting out of chair: {ACTIONS;DENIES/REPORTS:21021675::"Denies"} Difficulty using hands for taps, buttons, cutlery, and/or writing: {ACTIONS;DENIES/REPORTS:21021675::"Denies"}  No Rheumatology ROS completed.   PMFS History:  Patient Active Problem List   Diagnosis Date Noted  . Chronic insomnia 11/24/2017  . Insomnia secondary to chronic pain 11/24/2017  . Complaint related to dreams 11/24/2017  . 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 Neuropathy 08/18/2017  . 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 Obesity, Class I, BMI 30-34.9 12/03/2016  . Kidney stones 09/12/2016  . Nausea 09/12/2016  . High risk medication use 07/30/2016  . NAFLD (nonalcoholic fatty liver disease) 69/62/952807/25/2018  . Primary osteoarthritis of both feet 04/12/2016  . History of kidney stones 04/12/2016  . Primary osteoarthritis of both hands 04/11/2016  . Bilateral primary osteoarthritis of hip 04/11/2016  . Primary osteoarthritis of both knees 04/11/2016  . Spondylosis of lumbar region without myelopathy or radiculopathy 04/11/2016  . Hyperuricemia 04/11/2016  . Primary insomnia 04/11/2016  . Flank pain 04/02/2016  . Sinusitis 04/02/2016  . Elevated HDL  11/11/2015  . Elevated LFTs 11/11/2015  . Adjustment disorder with mixed anxiety and depressed mood 11/11/2015  . Dysuria 11/11/2015  . Hypothyroidism 10/14/2015  . Pseudogout involving multiple joints 10/14/2015  . Vitamin D deficiency 10/14/2015  . Hormone replacement therapy- per GYN 10/14/2015  . HTN (hypertension) 10/14/2015  . Fatigue 10/14/2015  . h/o Hiatal hernia 10/14/2015  . Sleep difficulties 10/14/2015  . Generalized OA 10/14/2015  . Counseling on health promotion and disease prevention 10/14/2015  . 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 Age-related nuclear cataract of both eyes 01/25/2015  . Posterior vitreous detachment of left eye 01/25/2015  . chronic Palpitations 07/19/2013    Past Medical History:  Diagnosis Date  . Arthritis   . Chest pain 06/2013   Left sided  . Chronic leg pain 06/2013  . Complication of anesthesia   . Dysrhythmia   . Fatigue   . GERD (gastroesophageal reflux disease)   . History of kidney stones   . Hypertension   . Hypothyroidism   . Nonproductive cough 06/2013  . Overweight 01/18/2014  . Peripheral neuropathy   . PONV (postoperative nausea and vomiting)   . Sleep difficulties    Change in sleep patterns  . SOB (shortness of breath) 06/2013  . Tachycardia 06/2013  . Thyroid disease    hypo  . UTI (lower urinary tract infection)     Family History  Problem Relation Age of Onset  . Migraines Other        Fam Hx   . Heart disease Other        Fam Hx  . Thyroid disease Other        Fam Hx - She also has thyroid  disease  . Diabetes Other        Fam Hx  . Arthritis Other        Fam Hx  . Gallbladder disease Other        Fam Hx  . Cancer Other        Fam Hx - Pancreatic  . Hypertension Other        Fam Hx  . Heart attack Cousin        Multiple cousins with MI's before 74 years of age  . Heart disease Mother 9  . Hyperlipidemia Mother 77  . Hypertension Mother 59  . Stroke Mother 1  . Heart disease Maternal  Grandmother   . Cancer Father 15       pancreatic  . Diabetes Brother 50  . Heart attack Maternal Uncle   . Heart attack Paternal Aunt    Past Surgical History:  Procedure Laterality Date  . ABDOMINAL SURGERY  1994  . CYSTOSCOPY/URETEROSCOPY/HOLMIUM LASER/STENT PLACEMENT Left 09/22/2017   Procedure: CYSTOSCOPY/RETROGRADE/URETEROSCOPY/ BASKET STONE EXTRACTION/STENT PLACEMENT;  Surgeon: Rene Paci, MD;  Location: [facility];  Service: Urology;  Laterality: Left;  ONLY NEEDS 60 MIN  . FOOT SURGERY Right 2009  . KIDNEY SURGERY Left 1993  . KNEE SURGERY     Multiple knee surgeries  . OOPHORECTOMY Left 1994  . TONSILLECTOMY    . URETEROSCOPY VIA URETEROSTOMY Left 1994   Social History   Social History Narrative  . Not on file    Objective: Vital Signs: There were no vitals taken for this visit.   Physical Exam   Musculoskeletal Exam: ***  CDAI Exam: CDAI Score: Not documented Patient Global Assessment: Not documented; Provider Global Assessment: Not documented Swollen: Not documented; Tender: Not documented Joint Exam   Not documented   There is currently no information documented on the homunculus. Go to the Rheumatology activity and complete the homunculus joint exam.  Investigation: No additional findings.  Imaging: Mm Diag Breast Tomo Bilateral  Result Date: 11/23/2017 CLINICAL DATA:  Intermittent nonfocal burning sensations in the left breast, more toward the anterior portion of the breast, for the past 2 months. EXAM: DIGITAL DIAGNOSTIC BILATERAL MAMMOGRAM WITH CAD AND TOMO COMPARISON:  Previous exam(s). ACR Breast Density Category b: There are scattered areas of fibroglandular density. FINDINGS: Stable mammographic appearance of the breasts with no interval abnormalities at the biopsy sites on the left. No findings suspicious for malignancy in either breast. Mammographic images were processed with CAD. IMPRESSION: No evidence of malignancy. RECOMMENDATION:  Bilateral screening mammogram in 1 year. I have discussed the findings and recommendations with the patient. Results were also provided in writing at the conclusion of the visit. If applicable, a reminder letter will be sent to the patient regarding the next appointment. BI-RADS CATEGORY  1: Negative. Electronically Signed   By: Beckie Salts M.D.   On: 11/23/2017 11:30    Recent Labs: Lab Results  Component Value Date   WBC 6.5 09/21/2017   HGB 15.3 (H) 09/21/2017   PLT 255 09/21/2017   NA 141 09/21/2017   K 3.8 09/21/2017   CL 103 09/21/2017   CO2 28 09/21/2017   GLUCOSE 110 (H) 09/21/2017   BUN 14 09/21/2017   CREATININE 0.44 09/21/2017   BILITOT 0.4 06/10/2017   ALKPHOS 78 06/10/2017   AST 34 06/10/2017   ALT 41 (H) 06/10/2017   PROT 6.6 08/17/2017   ALBUMIN 4.6 06/10/2017   CALCIUM 9.7 09/21/2017   GFRAA >60 09/21/2017  QFTBGOLDPLUS NEGATIVE 02/16/2017    Speciality Comments: No specialty comments available.  Procedures:  No procedures performed Allergies: Penicillins and Sulfa antibiotics   Assessment / Plan:     Visit Diagnoses: Pseudogout involving multiple joints - Colcrys 0.6 mg BID  Hyperuricemia  Discoloration of skin of toe  Primary osteoarthritis of both hands  Bilateral primary osteoarthritis of hip  Primary osteoarthritis of both knees  Primary osteoarthritis of both feet  Spondylosis of lumbar region without myelopathy or radiculopathy  NAFLD (nonalcoholic fatty liver disease)  h/o Hiatal hernia  Essential hypertension  Vitamin D deficiency  History of kidney stones  Primary insomnia   Orders: No orders of the defined types were placed in this encounter.  No orders of the defined types were placed in this encounter.   Face-to-face time spent with patient was *** minutes. Greater than 50% of time was spent in counseling and coordination of care.  Follow-Up Instructions: No follow-ups on file.   Gearldine Bienenstock, PA-C  Note - This  record has been created using Dragon software.  Chart creation errors have been sought, but may not always  have been located. Such creation errors do not reflect on  the standard of medical care.

## 2017-11-30 ENCOUNTER — Ambulatory Visit (HOSPITAL_COMMUNITY)
Admission: RE | Admit: 2017-11-30 | Discharge: 2017-11-30 | Disposition: A | Discharge status: Home / Self Care | Payer: BLUE CROSS/BLUE SHIELD | Source: Ambulatory Visit | Attending: Urology | Admitting: Urology

## 2017-11-30 DIAGNOSIS — N3020 Other chronic cystitis without hematuria: Secondary | ICD-10-CM | POA: Insufficient documentation

## 2017-11-30 DIAGNOSIS — N390 Urinary tract infection, site not specified: Secondary | ICD-10-CM | POA: Diagnosis not present

## 2017-11-30 DIAGNOSIS — Q625 Duplication of ureter: Secondary | ICD-10-CM | POA: Diagnosis not present

## 2017-11-30 DIAGNOSIS — N1370 Vesicoureteral-reflux, unspecified: Secondary | ICD-10-CM | POA: Insufficient documentation

## 2017-11-30 MED ORDER — IOTHALAMATE MEGLUMINE 17.2 % UR SOLN
750.0000 mL | Freq: Once | URETHRAL | Status: AC | PRN
  Administered 2017-11-30: 750 mL via INTRAVESICAL

## 2017-12-06 HISTORY — PX: OTHER SURGICAL HISTORY: SHX169

## 2017-12-07 NOTE — Progress Notes (Signed)
Office Visit Note  Patient: Marissa Larson             Date of Birth: 1955/05/21           MRN: [id number]             PCP: Mellody Dance, DO Referring: Mellody Dance, DO Visit Date: 12/15/2017 Occupation: _0 @  Subjective:  Pain in multiple joints  History of Present Illness: Skie Vitrano is a 62 y.o. female with history of pseudogout and osteoarthritis.  Patient reports that she continues to have paresthesias in bilateral hands and bilateral feet.  She states she also been experiencing some in her face as well.  She had a nerve conduction velocity study with EMG on 10/22/2017 that was normal.  She states that she has been evaluated by Dr. Jaynee Eagles and had extensive lab work which was normal.  She also had a small fiber biopsy on 1028 by Dr. Krista Blue.  Genetic testing was also performed to look for hereditary neuropathies.  Patient was advised to stop colchicine which she discontinued 2 weeks ago.  She has noticed some improvement in the neuropathy symptoms since stopping colchicine.  She has been taking gabapentin 600 mg by mouth at bedtime.  She states that since discontinuing colchicine she has had increased pain and swelling in multiple joints including bilateral wrists, bilateral knees, and bilateral feet.  She continues to have bilateral trochanteric bursitis.   Activities of Daily Living:  Patient reports morning stiffness for 30-60  minutes.   Patient Reports nocturnal pain.  Difficulty dressing/grooming: Denies Difficulty climbing stairs: Reports Difficulty getting out of chair: Reports Difficulty using hands for taps, buttons, cutlery, and/or writing: Reports  Review of Systems  Constitutional: Positive for fatigue.  HENT: Negative for mouth sores, mouth dryness and nose dryness.   Eyes: Negative for pain, visual disturbance and dryness.  Respiratory: Negative for cough, hemoptysis, shortness of breath and difficulty breathing.   Cardiovascular: Negative for chest pain,  palpitations, hypertension and swelling in legs/feet.  Gastrointestinal: Negative for blood in stool, constipation and diarrhea.  Endocrine: Negative for increased urination.  Genitourinary: Negative for painful urination.  Musculoskeletal: Positive for arthralgias, joint pain, joint swelling and morning stiffness. Negative for myalgias, muscle weakness, muscle tenderness and myalgias.  Skin: Positive for color change. Negative for pallor, rash, hair loss, nodules/bumps, skin tightness, ulcers and sensitivity to sunlight.  Allergic/Immunologic: Negative for susceptible to infections.  Neurological: Positive for numbness and parasthesias. Negative for dizziness, headaches and weakness.  Hematological: Negative for swollen glands.  Psychiatric/Behavioral: Negative for depressed mood and sleep disturbance. The patient is not nervous/anxious.     PMFS History:  Patient Active Problem List   Diagnosis Date Noted  . Paresthesia 12/09/2017  . Chronic insomnia 11/24/2017  . Insomnia secondary to chronic pain 11/24/2017  . Complaint related to dreams 11/24/2017  . 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 Neuropathy 08/18/2017  . 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 Obesity, Class I, BMI 30-34.9 12/03/2016  . Kidney stones 09/12/2016  . Nausea 09/12/2016  . High risk medication use 07/30/2016  . NAFLD (nonalcoholic fatty liver disease) 07/30/2016  . Primary osteoarthritis of both feet 04/12/2016  . History of kidney stones 04/12/2016  . Primary osteoarthritis of both hands 04/11/2016  . Bilateral primary osteoarthritis of hip 04/11/2016  . Primary osteoarthritis of both knees 04/11/2016  . Spondylosis of lumbar region without  myelopathy or radiculopathy 04/11/2016  . Hyperuricemia 04/11/2016  . Primary insomnia 04/11/2016  . Flank pain 04/02/2016  . Sinusitis 04/02/2016  . Elevated HDL  11/11/2015  . Elevated LFTs 11/11/2015  . Adjustment disorder with mixed anxiety and depressed mood 11/11/2015  . Dysuria 11/11/2015  . Hypothyroidism 10/14/2015  . Pseudogout involving multiple joints 10/14/2015  . Vitamin D deficiency 10/14/2015  . Hormone replacement therapy- per GYN 10/14/2015  . HTN (hypertension) 10/14/2015  . Fatigue 10/14/2015  . h/o Hiatal hernia 10/14/2015  . Sleep difficulties 10/14/2015  . Generalized OA 10/14/2015  . Counseling on health promotion and disease prevention 10/14/2015  . 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 Age-related nuclear cataract of both eyes 01/25/2015  . Posterior vitreous detachment of left eye 01/25/2015  . chronic Palpitations 07/19/2013    Past Medical History:  Diagnosis Date  . Arthritis   . Chest pain 06/2013   Left sided  . Chronic leg pain 06/2013  . Complication of anesthesia   . Dysrhythmia   . Fatigue   . GERD (gastroesophageal reflux disease)   . History of kidney stones   . Hypertension   . Hypothyroidism   . Nonproductive cough 06/2013  . Overweight 01/18/2014  . Peripheral neuropathy   . PONV (postoperative nausea and vomiting)   . Sleep difficulties    Change in sleep patterns  . SOB (shortness of breath) 06/2013  . Tachycardia 06/2013  . Thyroid disease    hypo  . UTI (lower urinary tract infection)     Family History  Problem Relation Age of Onset  . Migraines Other        Fam Hx   . Heart disease Other        Fam Hx  . Thyroid disease Other        Fam Hx - She also has thyroid disease  . Diabetes Other        Fam Hx  . Arthritis Other        Fam Hx  . Gallbladder disease Other        Fam Hx  . Cancer Other        Fam Hx - Pancreatic  . Hypertension Other        Fam Hx  . Heart attack Cousin        Multiple cousins with MI's before 53 years of age  . Heart disease Mother 29  . Hyperlipidemia Mother 84  . Hypertension Mother 46  . Stroke Mother 37  . Heart disease Maternal  Grandmother   . Cancer Father 77       pancreatic  . Diabetes Brother 54  . Heart attack Maternal Uncle   . Heart attack Paternal Aunt    Past Surgical History:  Procedure Laterality Date  . ABDOMINAL SURGERY  1994  . CYSTOSCOPY/URETEROSCOPY/HOLMIUM LASER/STENT PLACEMENT Left 09/22/2017   Procedure: CYSTOSCOPY/RETROGRADE/URETEROSCOPY/ BASKET STONE EXTRACTION/STENT PLACEMENT;  Surgeon: Ceasar Mons, MD;  Location: [facility];  Service: Urology;  Laterality: Left;  ONLY NEEDS 60 MIN  . FOOT SURGERY Right 2009  . KIDNEY SURGERY Left 1993  . KNEE SURGERY     Multiple knee surgeries  . OOPHORECTOMY Left 1994  . small fiber neuropathy biopsy  12/2017  . TONSILLECTOMY    . URETEROSCOPY VIA URETEROSTOMY Left 1994   Social History   Social History Narrative  . Not on file    Objective: Vital Signs: BP (!) 156/84 (BP Location: Left Arm, Patient Position: Sitting,  Cuff Size: Normal)   Pulse 74   Resp 12   Ht '5\' 6"'$  (1.676 m)   Wt 190 lb 12.8 oz (86.5 kg)   BMI 30.80 kg/m    Physical Exam  Constitutional: She is oriented to person, place, and time. She appears well-developed and well-nourished.  HENT:  Head: Normocephalic and atraumatic.  Eyes: Conjunctivae and EOM are normal.  Neck: Normal range of motion.  Cardiovascular: Normal rate, regular rhythm, normal heart sounds and intact distal pulses.  Pulmonary/Chest: Effort normal and breath sounds normal.  Abdominal: Soft. Bowel sounds are normal.  Lymphadenopathy:    She has no cervical adenopathy.  Neurological: She is alert and oriented to person, place, and time.  Skin: Skin is warm and dry. Capillary refill takes less than 2 seconds.  Psychiatric: She has a normal mood and affect. Her behavior is normal.  Nursing note and vitals reviewed.    Musculoskeletal Exam: C-spine limited ROM with discomfort.  Thoracic and lumbar spine good ROM.  No midline spinal tenderness.  No SI joint tenderness.  Shoulder joints, elbow  joints, wrist joints, MCPs, PIPs, and DIPs good ROM with no synovitis.  Tenderness of bilateral wrist joints.  Hip joints limited ROM with discomfort.  Knee joints good ROM with no warmth or effusion. Good ROM of both ankle joints.  No warmth or effusion of ankle joints.  Tenderness of all MTPs.  Erythema of right great toe.  Pes planus and hammertoes noted.  Tenderness of bilateral trochanteric bursa.   CDAI Exam: CDAI Score: Not documented Patient Global Assessment: Not documented; Provider Global Assessment: Not documented Swollen: Not documented; Tender: Not documented Joint Exam   Not documented   There is currently no information documented on the homunculus. Go to the Rheumatology activity and complete the homunculus joint exam.  Investigation: No additional findings.  Imaging: Dg Cystogram Voiding  Result Date: 11/30/2017 CLINICAL DATA:  Recurrent urinary tract infections. 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 EXAM: VOIDING CYSTOURETHROGRAM TECHNIQUE: After catheterization of the urinary bladder following sterile technique by nursing personnel, the bladder was filled with 750 ml Cysto-hypaque 30% by drip infusion. Serial spot images were obtained during bladder filling and voiding. FLUOROSCOPY TIME:  Fluoroscopy Time:  1 minutes 12 seconds Radiation Exposure Index (if provided by the fluoroscopic device): 171.3 mGy COMPARISON:  CT scans of the abdomen dated 08/27/2017 and 03/12/2017 and retrograde urogram dated 09/22/2017 FINDINGS: The scout image demonstrates no acute abnormality. Surgical clips in the left mid abdomen appear to be on the left ovarian vein on the prior CT scans. 750 cc of water-soluble contrast was instilled in the bladder through the Foley catheter. At approximately 500 cc there was reflux into the distal 10 cm of each of the duplicated left ureters. At 750 cc contrast was noted to extend into the duplicated pelvocaliceal system of the left  kidney. There is slight blunting of the calices of the upper and lower pole moieties. There is only minimal dilatation of the ureters proximally. The appearance is consistent with grade 3-4 left vesicoureteral reflux. There is minimal residual in the bladder after voiding. There was complete emptying of the contrast from the left renal collecting system after voiding. IMPRESSION: 1. 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 Electronically Signed   By: Lorriane Shire M.D.   On: 11/30/2017 13:02   Mm Diag Breast Tomo Bilateral  Result Date: 11/23/2017 CLINICAL DATA:  Intermittent nonfocal burning sensations in the left breast,  more toward the anterior portion of the breast, for the past 2 months. EXAM: DIGITAL DIAGNOSTIC BILATERAL MAMMOGRAM WITH CAD AND TOMO COMPARISON:  Previous exam(s). ACR Breast Density Category b: There are scattered areas of fibroglandular density. FINDINGS: Stable mammographic appearance of the breasts with no interval abnormalities at the biopsy sites on the left. No findings suspicious for malignancy in either breast. Mammographic images were processed with CAD. IMPRESSION: No evidence of malignancy. RECOMMENDATION: Bilateral screening mammogram in 1 year. I have discussed the findings and recommendations with the patient. Results were also provided in writing at the conclusion of the visit. If applicable, a reminder letter will be sent to the patient regarding the next appointment. BI-RADS CATEGORY  1: Negative. Electronically Signed   By: Claudie Revering M.D.   On: 11/23/2017 11:30    Recent Labs: Lab Results  Component Value Date   WBC 6.5 09/21/2017   HGB 15.3 (H) 09/21/2017   PLT 255 09/21/2017   NA 141 09/21/2017   K 3.8 09/21/2017   CL 103 09/21/2017   CO2 28 09/21/2017   GLUCOSE 110 (H) 09/21/2017   BUN 14 09/21/2017   CREATININE 0.44 09/21/2017   BILITOT 0.4 06/10/2017   ALKPHOS 78 06/10/2017   AST 34 06/10/2017   ALT 41 (H)  06/10/2017   PROT 6.6 08/17/2017   ALBUMIN 4.6 06/10/2017   CALCIUM 9.7 09/21/2017   GFRAA >60 09/21/2017   QFTBGOLDPLUS NEGATIVE 02/16/2017    Speciality Comments: No specialty comments available.  Procedures:  No procedures performed Allergies: Penicillins and Sulfa antibiotics   Assessment / Plan:     Visit Diagnoses: Pseudogout involving multiple joints -She was previously taking colchicine 0.6 mg twice daily.She has been off of colchicine for the past 2 weeks due to questioning if Colchicine was causing the paresthesias that she has been experiencing in bilateral hands and bilateral feet.    Her symptoms of paresthesias have subsided since discontinuing colchicine.  She has been having increased pain and swelling in multiple joints since discontinuing colchicine.  She had a flare last week in bilateral feet, bilateral knees, and bilateral wrists.  She will be switched to Relafen 500 mg twice daily.   Side effects reviewed today in the office.  She will return for lab work in 2 months and then every 6 months. Standing orders placed today.  She was advised to notify us if she continues to flare.  She will follow-up in the office in 5 months.  Hyperuricemia  Primary osteoarthritis of both hands: She has PIP and DIP synovial thickening consistent with osteoarthritis of bilateral hands.  She has complete fist formation bilaterally.  No synovitis noted.  Joint protection and muscle strengthening were discussed.  Bilateral primary osteoarthritis of hip: She has limited range of motion with discomfort of bilateral hips on exam.   Primary osteoarthritis of both knees: No warmth or effusion.  Good range of motion.  She is been having increased discomfort in bilateral knee joints since discontinuing colchicine.  Primary osteoarthritis of both feet: She has pes planus and hammertoes.  She has been having increased discomfort in bilateral feet since discontinuing colchicine.  We discussed the  importance of wearing proper fitting shoes.  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  No midline spinal tenderness at this time.  Small fiber neuropathy: She has been evaluated by Dr. Jaynee Eagles who has completed a thorough examination, lab work, and testing.   Lab work was unremarkable.  NCV with EMG  was normal.  A skin biopsy for small fiber neuropathy was performed.  A genetic test kit was provided to evaluate for hereditary neuropathy.  Patient was advised to discontinue colchicine which she has been taking 0.6 mg twice daily.  She has been on for 2 weeks and started flaring.  Her symptoms of neuropathy have subsided since discontinuing colchicine.   Paresthesias: She continues to have paresthesias in bilateral hands and feet but her symptoms have subsided since discontinuing colchicine 2 weeks ago.  Other medical conditions are listed as follows:  NAFLD (nonalcoholic fatty liver disease)  h/o Hiatal hernia  Essential hypertension  Vitamin D deficiency  History of kidney stones  Primary insomnia   Orders: Orders Placed This Encounter  Procedures  . CBC with Differential/Platelet  . COMPLETE METABOLIC PANEL WITH GFR   Meds ordered this encounter  Medications  . nabumetone (RELAFEN) 500 MG tablet    Sig: Take 1 tablet (500 mg total) by mouth 2 (two) times daily.    Dispense:  60 tablet    Refill:  2    Face-to-face time spent with patient was 30 minutes. Greater than 50% of time was spent in counseling and coordination of care.  Follow-Up Instructions: Return in about 5 months (around 05/16/2018) for Pseudogout, Osteoarthritis.   Ofilia Neas, PA-C  Note - This record has been created using Dragon software.  Chart creation errors have been sought, but may not always  have been located. Such creation errors do not reflect on  the standard of medical care.

## 2017-12-09 ENCOUNTER — Ambulatory Visit (INDEPENDENT_AMBULATORY_CARE_PROVIDER_SITE_OTHER): Payer: BLUE CROSS/BLUE SHIELD | Admitting: Neurology

## 2017-12-09 ENCOUNTER — Encounter: Payer: Self-pay | Admitting: Neurology

## 2017-12-09 VITALS — BP 162/91 | HR 80 | Ht 66.0 in | Wt 188.0 lb

## 2017-12-09 DIAGNOSIS — R202 Paresthesia of skin: Secondary | ICD-10-CM | POA: Diagnosis not present

## 2017-12-09 NOTE — Progress Notes (Signed)
Patient was in left lateral recombinant position. Sterile technique. 1% lidocaine with epinephrine was used for local anesthesia. Punctuated skin biopsy was performed. 3 mm skin sample were obtained at right foot, above right extensor digitorum brevis, and right lateral calf, 10 cm above lateral malleolus, lateral thigh, 20 cm below superior iliac spine.  Patient tolerated the procedure well.  The wound was covered with neosporin antibiotic cream and bandage. 

## 2017-12-10 ENCOUNTER — Ambulatory Visit: Payer: BLUE CROSS/BLUE SHIELD | Admitting: Physician Assistant

## 2017-12-15 ENCOUNTER — Encounter: Payer: Self-pay | Admitting: Physician Assistant

## 2017-12-15 ENCOUNTER — Ambulatory Visit (INDEPENDENT_AMBULATORY_CARE_PROVIDER_SITE_OTHER): Payer: BLUE CROSS/BLUE SHIELD | Admitting: Physician Assistant

## 2017-12-15 VITALS — BP 156/84 | HR 74 | Resp 12 | Ht 66.0 in | Wt 190.8 lb

## 2017-12-15 DIAGNOSIS — M160 Bilateral primary osteoarthritis of hip: Secondary | ICD-10-CM | POA: Diagnosis not present

## 2017-12-15 DIAGNOSIS — Z87442 Personal history of urinary calculi: Secondary | ICD-10-CM

## 2017-12-15 DIAGNOSIS — M19041 Primary osteoarthritis, right hand: Secondary | ICD-10-CM

## 2017-12-15 DIAGNOSIS — E790 Hyperuricemia without signs of inflammatory arthritis and tophaceous disease: Secondary | ICD-10-CM

## 2017-12-15 DIAGNOSIS — E559 Vitamin D deficiency, unspecified: Secondary | ICD-10-CM

## 2017-12-15 DIAGNOSIS — K449 Diaphragmatic hernia without obstruction or gangrene: Secondary | ICD-10-CM

## 2017-12-15 DIAGNOSIS — I10 Essential (primary) hypertension: Secondary | ICD-10-CM

## 2017-12-15 DIAGNOSIS — M1189 Other specified crystal arthropathies, multiple sites: Secondary | ICD-10-CM

## 2017-12-15 DIAGNOSIS — M19072 Primary osteoarthritis, left ankle and foot: Secondary | ICD-10-CM

## 2017-12-15 DIAGNOSIS — M170 Bilateral primary osteoarthritis of knee: Secondary | ICD-10-CM

## 2017-12-15 DIAGNOSIS — M19042 Primary osteoarthritis, left hand: Secondary | ICD-10-CM

## 2017-12-15 DIAGNOSIS — K760 Fatty (change of) liver, not elsewhere classified: Secondary | ICD-10-CM

## 2017-12-15 DIAGNOSIS — F5101 Primary insomnia: Secondary | ICD-10-CM

## 2017-12-15 DIAGNOSIS — Z5181 Encounter for therapeutic drug level monitoring: Secondary | ICD-10-CM

## 2017-12-15 DIAGNOSIS — G629 Polyneuropathy, unspecified: Secondary | ICD-10-CM

## 2017-12-15 DIAGNOSIS — M47816 Spondylosis without myelopathy or radiculopathy, lumbar region: Secondary | ICD-10-CM

## 2017-12-15 DIAGNOSIS — R202 Paresthesia of skin: Secondary | ICD-10-CM

## 2017-12-15 DIAGNOSIS — M19071 Primary osteoarthritis, right ankle and foot: Secondary | ICD-10-CM

## 2017-12-15 MED ORDER — NABUMETONE 500 MG PO TABS: 500.0000 mg | ORAL_TABLET | Freq: Two times a day (BID) | ORAL | 2 refills | Status: DC

## 2017-12-15 NOTE — Patient Instructions (Signed)
Standing Labs We placed an order today for your standing lab work.    Please come back and get your standing labs in 2 months then every 6 months   We have open lab Monday through Friday from 8:30-11:30 AM and 1:30-4:00 PM  at the office of Dr. Pollyann SavoyShaili Deveshwar.   You may experience shorter wait times on Monday and Friday afternoons. The office is located at [address], [address], [place], [place] [address] No appointment is necessary.   Labs are drawn by [facility].  You may receive a bill from [facility] for your lab work. If you have any questions regarding directions or hours of operation,  please call [phone].   Just as a reminder please drink plenty of water prior to coming for your lab work. Thanks!

## 2017-12-17 DIAGNOSIS — N1371 Vesicoureteral-reflux without reflux nephropathy: Secondary | ICD-10-CM | POA: Diagnosis not present

## 2017-12-17 DIAGNOSIS — R1084 Generalized abdominal pain: Secondary | ICD-10-CM | POA: Diagnosis not present

## 2017-12-17 DIAGNOSIS — N3020 Other chronic cystitis without hematuria: Secondary | ICD-10-CM | POA: Diagnosis not present

## 2017-12-21 ENCOUNTER — Telehealth: Payer: Self-pay | Admitting: *Deleted

## 2017-12-21 NOTE — Telephone Encounter (Signed)
Dr. Terrace ArabiaYan has reviewed results.  Per Dr. Zannie CoveYan's vo, patient should be notified of no significant abnormalities.

## 2017-12-21 NOTE — Telephone Encounter (Signed)
Let he know her small fibres look good. So far there is no evidence of any nerve damage causing her symptoms. I still would stop colchicine for a few weeks and see if it helps. May examine other medications and any OTC meds or supplements she is taking for side effects. But I see nothing wrong with her nerves, this may be caused by something else. No evidence for small-fiber neuropathy

## 2017-12-21 NOTE — Telephone Encounter (Signed)
Skin Biopsy performed on 12/09/17.  Results:  A.  Rt. Thigh:  Skin with normal Epidermal Nerve Fiber Density. B.  Rt. Calf:  Skin with low normal Epidermal Fiber Density. C.. Rt Foot:  Skin with low normal Epidermal Fiber Density.

## 2017-12-22 NOTE — Telephone Encounter (Addendum)
I spoke with the patient and discussed the results of her skin biopsy from Dr. Lucia GaskinsAhern. Discussed that her small fibers look good. So far there is no evidence of any nerve damage causing her symptoms. Dr. Lucia GaskinsAhern would still suggest to stop the colchicine for a few weeks to see if that helps. Also pt can examine other medications with PCP and any OTC medications or supplements she is taking to see if this is a side effects caused by them. But Dr. Lucia GaskinsAhern sees nothing wrong with her nerves, this may be caused by something else. Advised that Dr. Lucia GaskinsAhern said there's no evidence for small fiber neuropathy. We did notice that pt did not complete the genetic testing and discussed that she is welcome to do it still and we can call her with the results. Pt does not need to follow up with neurology and can return to PCP. However discussed that pt is welcome to call back with any questions she may have. Pt was happy with these results and was pleased to get the good report of no small fiber neuropathy. She verbalized no concerns and stated that she will probably send in the genetic testing.

## 2017-12-22 NOTE — Telephone Encounter (Signed)
Called pt & LVM asking for call back.  

## 2017-12-22 NOTE — Telephone Encounter (Signed)
Spoke with Dr. Lucia GaskinsAhern, pt does not need to follow up with neurology, her nerves are normal. Follow back up with primary care to see if her symptoms are being caused by something in the home, her medications, etc. Looks like pt hasn't done the genetic testing for neuropathy. She is welcome to do that still and we can follow up if the results are abnormal.

## 2018-01-26 ENCOUNTER — Telehealth: Payer: Self-pay | Admitting: *Deleted

## 2018-01-26 NOTE — Telephone Encounter (Signed)
Per Sherron Ales, PA-C Please advise to return for CBC and CMP. We will continue to monitor LFTs closely while she is taking Relafen 500 mg BID. She developed paresthesias while on Colchicine, so she does not want to restart on colchicine for management of CPPD.    Attempted to contact the patient and left message for patient to call the office.

## 2018-01-27 ENCOUNTER — Telehealth: Payer: Self-pay | Admitting: Rheumatology

## 2018-01-27 NOTE — Telephone Encounter (Signed)
Patient called stating she was returning your call.   °

## 2018-01-27 NOTE — Telephone Encounter (Signed)
Per Sherron Ales, PA-C Patient advised to return for CBC and CMP. We will continue to monitor LFTs closely while she is taking Relafen 500 mg BID. She developed paresthesias while on Colchicine, so she does not want to restart on colchicine for management of CPPD.  Patient will come either 01/28/18 or 01/29/18

## 2018-02-10 ENCOUNTER — Other Ambulatory Visit: Payer: Self-pay

## 2018-02-10 DIAGNOSIS — Z5181 Encounter for therapeutic drug level monitoring: Secondary | ICD-10-CM

## 2018-02-10 NOTE — Progress Notes (Deleted)
Office Visit Note  Patient: Marissa Larson             Date of Birth: Jan 26, 1955           MRN: [id number]             PCP: Thomasene Lotpalski, Deborah, DO Referring: Thomasene Lotpalski, Deborah, DO Visit Date: 02/24/2018 Occupation: @GUAROCC @  Subjective:  No chief complaint on file.   History of Present Illness: Marissa Larson is a 63 y.o. female ***   Activities of Daily Living:  Patient reports morning stiffness for *** {minute/hour:19697}.   Patient {ACTIONS;DENIES/REPORTS:21021675::"Denies"} nocturnal pain.  Difficulty dressing/grooming: {ACTIONS;DENIES/REPORTS:21021675::"Denies"} Difficulty climbing stairs: {ACTIONS;DENIES/REPORTS:21021675::"Denies"} Difficulty getting out of chair: {ACTIONS;DENIES/REPORTS:21021675::"Denies"} Difficulty using hands for taps, buttons, cutlery, and/or writing: {ACTIONS;DENIES/REPORTS:21021675::"Denies"}  No Rheumatology ROS completed.   PMFS History:  Patient Active Problem List   Diagnosis Date Noted  . Paresthesia 12/09/2017  . Chronic insomnia 11/24/2017  . Insomnia secondary to chronic pain 11/24/2017  . Complaint related to dreams 11/24/2017  . 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 Neuropathy 08/18/2017  . 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 Obesity, Class I, BMI 30-34.9 12/03/2016  . Kidney stones 09/12/2016  . Nausea 09/12/2016  . High risk medication use 07/30/2016  . NAFLD (nonalcoholic fatty liver disease) 40/98/119107/25/2018  . Primary osteoarthritis of both feet 04/12/2016  . History of kidney stones 04/12/2016  . Primary osteoarthritis of both hands 04/11/2016  . Bilateral primary osteoarthritis of hip 04/11/2016  . Primary osteoarthritis of both knees 04/11/2016  . Spondylosis of lumbar region without myelopathy or radiculopathy 04/11/2016  . Hyperuricemia 04/11/2016  . Primary insomnia 04/11/2016  . Flank pain 04/02/2016  . Sinusitis  04/02/2016  . Elevated HDL 11/11/2015  . Elevated LFTs 11/11/2015  . Adjustment disorder with mixed anxiety and depressed mood 11/11/2015  . Dysuria 11/11/2015  . Hypothyroidism 10/14/2015  . Pseudogout involving multiple joints 10/14/2015  . Vitamin D deficiency 10/14/2015  . Hormone replacement therapy- per GYN 10/14/2015  . HTN (hypertension) 10/14/2015  . Fatigue 10/14/2015  . h/o Hiatal hernia 10/14/2015  . Sleep difficulties 10/14/2015  . Generalized OA 10/14/2015  . Counseling on health promotion and disease prevention 10/14/2015  . 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 Age-related nuclear cataract of both eyes 01/25/2015  . Posterior vitreous detachment of left eye 01/25/2015  . chronic Palpitations 07/19/2013    Past Medical History:  Diagnosis Date  . Arthritis   . Chest pain 06/2013   Left sided  . Chronic leg pain 06/2013  . Complication of anesthesia   . Dysrhythmia   . Fatigue   . GERD (gastroesophageal reflux disease)   . History of kidney stones   . Hypertension   . Hypothyroidism   . Nonproductive cough 06/2013  . Overweight 01/18/2014  . Peripheral neuropathy   . PONV (postoperative nausea and vomiting)   . Sleep difficulties    Change in sleep patterns  . SOB (shortness of breath) 06/2013  . Tachycardia 06/2013  . Thyroid disease    hypo  . UTI (lower urinary tract infection)     Family History  Problem Relation Age of Onset  . Migraines Other        Fam Hx   . Heart disease Other        Fam Hx  . Thyroid disease Other        Fam Hx -  She also has thyroid disease  . Diabetes Other        Fam Hx  . Arthritis Other        Fam Hx  . Gallbladder disease Other        Fam Hx  . Cancer Other        Fam Hx - Pancreatic  . Hypertension Other        Fam Hx  . Heart attack Cousin        Multiple cousins with MI's before 61 years of age  . Heart disease Mother 5240  . Hyperlipidemia Mother 5340  . Hypertension Mother 6340  . Stroke Mother 7565  .  Heart disease Maternal Grandmother   . Cancer Father 2158       pancreatic  . Diabetes Brother 50  . Heart attack Maternal Uncle   . Heart attack Paternal Aunt    Past Surgical History:  Procedure Laterality Date  . ABDOMINAL SURGERY  1994  . CYSTOSCOPY/URETEROSCOPY/HOLMIUM LASER/STENT PLACEMENT Left 09/22/2017   Procedure: CYSTOSCOPY/RETROGRADE/URETEROSCOPY/ BASKET STONE EXTRACTION/STENT PLACEMENT;  Surgeon: Rene PaciWinter, Christopher Aaron, MD;  Location: [facility];  Service: Urology;  Laterality: Left;  ONLY NEEDS 60 MIN  . FOOT SURGERY Right 2009  . KIDNEY SURGERY Left 1993  . KNEE SURGERY     Multiple knee surgeries  . OOPHORECTOMY Left 1994  . small fiber neuropathy biopsy  12/2017  . TONSILLECTOMY    . URETEROSCOPY VIA URETEROSTOMY Left 1994   Social History   Social History Narrative  . Not on file   Immunization History  Administered Date(s) Administered  . Tdap 01/07/2011     Objective: Vital Signs: There were no vitals taken for this visit.   Physical Exam   Musculoskeletal Exam: ***  CDAI Exam: CDAI Score: Not documented Patient Global Assessment: Not documented; Provider Global Assessment: Not documented Swollen: Not documented; Tender: Not documented Joint Exam   Not documented   There is currently no information documented on the homunculus. Go to the Rheumatology activity and complete the homunculus joint exam.  Investigation: No additional findings.  Imaging: No results found.  Recent Labs: Lab Results  Component Value Date   WBC 6.5 09/21/2017   HGB 15.3 (H) 09/21/2017   PLT 255 09/21/2017   NA 141 09/21/2017   K 3.8 09/21/2017   CL 103 09/21/2017   CO2 28 09/21/2017   GLUCOSE 110 (H) 09/21/2017   BUN 14 09/21/2017   CREATININE 0.44 09/21/2017   BILITOT 0.4 06/10/2017   ALKPHOS 78 06/10/2017   AST 34 06/10/2017   ALT 41 (H) 06/10/2017   PROT 6.6 08/17/2017   ALBUMIN 4.6 06/10/2017   CALCIUM 9.7 09/21/2017   GFRAA >60 09/21/2017    QFTBGOLDPLUS NEGATIVE 02/16/2017    Speciality Comments: No specialty comments available.  Procedures:  No procedures performed Allergies: Penicillins and Sulfa antibiotics   Assessment / Plan:     Visit Diagnoses: Pseudogout involving multiple joints - d/c colchicine-SE paresthesias, started on Relafen 500 mg BID   Hyperuricemia  Primary osteoarthritis of both hands  Bilateral primary osteoarthritis of hip  Primary osteoarthritis of both knees  Primary osteoarthritis of both feet  Spondylosis of lumbar region without myelopathy or radiculopathy  Small fiber neuropathy  Paresthesias  NAFLD (nonalcoholic fatty liver disease)  h/o Hiatal hernia  Essential hypertension  Vitamin D deficiency  History of kidney stones  Primary insomnia   Orders: No orders of the defined types were placed in this encounter.  No  orders of the defined types were placed in this encounter.   Face-to-face time spent with patient was *** minutes. Greater than 50% of time was spent in counseling and coordination of care.  Follow-Up Instructions: No follow-ups on file.   Gearldine Bienenstock, PA-C  Note - This record has been created using Dragon software.  Chart creation errors have been sought, but may not always  have been located. Such creation errors do not reflect on  the standard of medical care.

## 2018-02-11 LAB — COMPLETE METABOLIC PANEL WITH GFR
AG Ratio: 1.8 (calc) (ref 1.0–2.5)
ALBUMIN MSPROF: 4.5 g/dL (ref 3.6–5.1)
ALT: 37 U/L — ABNORMAL HIGH (ref 6–29)
AST: 35 U/L (ref 10–35)
Alkaline phosphatase (APISO): 93 U/L (ref 37–153)
BILIRUBIN TOTAL: 0.5 mg/dL (ref 0.2–1.2)
BUN: 18 mg/dL (ref 7–25)
CO2: 30 mmol/L (ref 20–32)
CREATININE: 0.63 mg/dL (ref 0.50–0.99)
Calcium: 9.5 mg/dL (ref 8.6–10.4)
Chloride: 99 mmol/L (ref 98–110)
GFR, Est African American: 113 mL/min/{1.73_m2} (ref >=60)
GFR, Est Non African American: 97 mL/min/{1.73_m2} (ref >=60)
GLOBULIN: 2.5 g/dL (ref 1.9–3.7)
Glucose, Bld: 113 mg/dL — ABNORMAL HIGH (ref 65–99)
POTASSIUM: 3.5 mmol/L (ref 3.5–5.3)
SODIUM: 141 mmol/L (ref 135–146)
Total Protein: 7 g/dL (ref 6.1–8.1)

## 2018-02-11 LAB — CBC WITH DIFFERENTIAL/PLATELET
Absolute Monocytes: 593 cells/uL (ref 200–950)
BASOS ABS: 62 {cells}/uL (ref 0–200)
Basophils Relative: 0.6 %
EOS ABS: 83 {cells}/uL (ref 15–500)
Eosinophils Relative: 0.8 %
HEMATOCRIT: 43 % (ref 35.0–45.0)
Hemoglobin: 15.3 g/dL (ref 11.7–15.5)
LYMPHS ABS: 3671 {cells}/uL (ref 850–3900)
MCH: 32.5 pg (ref 27.0–33.0)
MCHC: 35.6 g/dL (ref 32.0–36.0)
MCV: 91.3 fL (ref 80.0–100.0)
MPV: 10.7 fL (ref 7.5–12.5)
Monocytes Relative: 5.7 %
NEUTROS ABS: 5990 {cells}/uL (ref 1500–7800)
Neutrophils Relative %: 57.6 %
Platelets: 252 10*3/uL (ref 140–400)
RBC: 4.71 10*6/uL (ref 3.80–5.10)
RDW: 12.8 % (ref 11.0–15.0)
Total Lymphocyte: 35.3 %
WBC: 10.4 10*3/uL (ref 3.8–10.8)

## 2018-02-11 NOTE — Progress Notes (Signed)
ALT is elevated-37.  If she is clinically doing well, please advise patient to reduce Relafen 500 mg 1 tablet po daily.  We can discuss further at appt on 02/24/18.  Please advise patient to avoid tylenol and alcohol at this time.  Glucose is elevated-113. Rest of lab work is WNL.

## 2018-02-15 ENCOUNTER — Other Ambulatory Visit: Payer: Self-pay | Admitting: Family Medicine

## 2018-02-24 ENCOUNTER — Ambulatory Visit: Payer: BLUE CROSS/BLUE SHIELD | Admitting: Physician Assistant

## 2018-03-17 ENCOUNTER — Other Ambulatory Visit: Payer: Self-pay | Admitting: Physician Assistant

## 2018-03-17 NOTE — Telephone Encounter (Signed)
Last Visit: 12/15/17 Next visit: 05/18/18 Labs: 02/10/18 ALT is elevated-37. Glucose is elevated-113. Rest of lab work is WNL.  Okay to refill per Dr. Corliss Skains

## 2018-03-19 ENCOUNTER — Telehealth: Payer: Self-pay | Admitting: Rheumatology

## 2018-03-19 NOTE — Telephone Encounter (Signed)
Patient called stating she has been taking the Relafen and doesn't feel like it is helping at all.  Patient is requesting a return call to discuss other medications that she could take.

## 2018-03-22 NOTE — Telephone Encounter (Signed)
Patient advised due to her elevated LFTs we can not increase NSAIDS or add tylenol. She may use topical Diclofenac. Patient states she does not need a prescription as she already has some at home.

## 2018-03-22 NOTE — Telephone Encounter (Signed)
Patient states she was given Relafen at her last visit. Patient states the medication dose was cut in half recently due to her elevated ALT. Patient states the medication was not working well before on the two tablets daily. Patient would like to know if there is something else she may take. Patient is using it for Pseudogout involving multiple joints. Patient was last seen 12/15/17 and due to follow up 05/18/18.

## 2018-03-22 NOTE — Telephone Encounter (Signed)
Due to her elevated LFTs we can not increase NSAIDS or add tylenol. She may use topical Diclofenac.

## 2018-04-22 ENCOUNTER — Encounter: Payer: Self-pay | Admitting: Family Medicine

## 2018-04-22 ENCOUNTER — Other Ambulatory Visit: Payer: Self-pay

## 2018-04-22 ENCOUNTER — Ambulatory Visit (INDEPENDENT_AMBULATORY_CARE_PROVIDER_SITE_OTHER): Payer: BLUE CROSS/BLUE SHIELD | Admitting: Family Medicine

## 2018-04-22 VITALS — Ht 65.0 in | Wt 190.0 lb

## 2018-04-22 DIAGNOSIS — G479 Sleep disorder, unspecified: Secondary | ICD-10-CM | POA: Diagnosis not present

## 2018-04-22 DIAGNOSIS — E038 Other specified hypothyroidism: Secondary | ICD-10-CM

## 2018-04-22 DIAGNOSIS — E790 Hyperuricemia without signs of inflammatory arthritis and tophaceous disease: Secondary | ICD-10-CM

## 2018-04-22 DIAGNOSIS — Q625 Duplication of ureter: Secondary | ICD-10-CM

## 2018-04-22 DIAGNOSIS — F4323 Adjustment disorder with mixed anxiety and depressed mood: Secondary | ICD-10-CM

## 2018-04-22 DIAGNOSIS — M1189 Other specified crystal arthropathies, multiple sites: Secondary | ICD-10-CM

## 2018-04-22 MED ORDER — TRAZODONE HCL 50 MG PO TABS: 25.0000 mg | ORAL_TABLET | Freq: Every evening | ORAL | 0 refills | Status: DC | PRN

## 2018-04-22 NOTE — Progress Notes (Signed)
Virtual Visit via Telephone Note for Marsh & McLennanDeborah Tijuan Larson, D.O- [facility]   I connected with current patient today by telephone and verified that I am speaking with the correct person using two identifiers.   Because of federal recommendations of social distancing due to the current novel COVID-19 outbreak, an audio/video telehealth visit is felt to be most appropriate for this patient at this time.  My staff members also discussed with the patient that there may be a patient charge related to this service.   The patient expressed understanding, and agreed to proceed.    History of Present Illness:   Mood:   Patient last seen 06/16/2017.   At that time we increased Lexapro from 10 to 20 mg.   Pt started with bad sweating with that inc dose- so she backed down to 10mg  dose again- was fine.    Marissa GuysKate Larson was her best friend- and Marissa Larson died, so it was very difficult time for pt then.    Been more stressed with work lately- thus pt inc her dose on her own just a few weeks ago- taking 10 mg BID.  But patient then corrects that she actually went back to just 1 tablet daily earlier this week.   Sleep disorder:   We kept her on Lunesta 1 mg nightly ( per pt she is taking it prn).   We discussed meditative strategies and various other things to help with her sleep.  She only takes it as needed for sleep but per patient, it at times only has the opposite effect and can make her feel stimulated.      Dr Liliane ShiWinter- Urology- did procedure on her Sept 2019.  She was having bladder reflux-- esp L side.  Stage 3-4 reflux.  Pt has been on abx once daily since- 100 mg macrobid.   Was in terrible pain after procedure.  Pt states "she went crazy" coming out of anesthesia- she was swearing and yelling at everyone etc.   She was tied down and sedated b/c of it.    Pt needs surgery to fix the reflux   Had purple toes and feet. Rheum sent her neuro- after extensive w/up revealed it was s-e  of colcrys.  Neuro, Dr Lucia GaskinsAhern, gave gabapentin to help sleep and with her pain etc.   But makes her feel hung over.   Pt was referred to another neurologist for sleep study- but pt did not f/up as instructed.    Pt works remotely already-   Husband and her at home- avoiding others etc.    Wt Readings from Last 3 Encounters:  04/22/18 190 lb (86.2 kg)  12/15/17 190 lb 12.8 oz (86.5 kg)  12/09/17 188 lb (85.3 kg)    BP Readings from Last 3 Encounters:  12/15/17 (!) 156/84  12/09/17 (!) 162/91  11/24/17 134/72    Pulse Readings from Last 3 Encounters:  12/15/17 74  12/09/17 80  11/24/17 77    BMI Readings from Last 3 Encounters:  04/22/18 31.62 kg/m  12/15/17 30.80 kg/m  12/09/17 30.34 kg/m      -Vitals obtained; Medications, allergies reconciled;  personal medical, social, Sx etc. etc. histories were updated by Leda MinMelissa Pulliam the medical assistant today and are reflected in below chart   Patient Care Team    Relationship Specialty Notifications Start End  Thomasene Lotpalski, Chayson Charters, DO PCP - General Family Medicine  10/09/15   Marinus Mawaylor, Gregg W, MD Consulting Physician  Cardiology  10/09/15   Salvatore Marvel, MD Consulting Physician Orthopedic Surgery  10/09/15   Pollyann Savoy, MD Consulting Physician Rheumatology  10/09/15   Altheimer, Casimiro Needle, MD Consulting Physician Endocrinology  10/09/15   Sharrell Ku, MD Consulting Physician Gastroenterology  10/09/15   Freddy Finner, MD Consulting Physician Obstetrics and Gynecology  03/13/17   Rene Paci, MD Consulting Physician Urology  03/13/17   Anson Fret, MD Consulting Physician Neurology  04/22/18      Patient Active Problem List   Diagnosis Date Noted   NAFLD (nonalcoholic fatty liver disease) 16/10/9602    Priority: High   Elevated LFTs 11/11/2015    Priority: High   Adjustment disorder with mixed anxiety and depressed mood 11/11/2015    Priority: High   Pseudogout involving multiple joints 10/14/2015     Priority: High   HTN (hypertension) 10/14/2015    Priority: High   Elevated HDL 11/11/2015    Priority: Medium   Hypothyroidism 10/14/2015    Priority: Medium   Generalized OA 10/14/2015    Priority: Medium   Gastric ulcer- due to Mobic 10/09/2015    Priority: Medium   Hyperuricemia 04/11/2016    Priority: Low   Primary insomnia 04/11/2016    Priority: Low   Vitamin D deficiency 10/14/2015    Priority: Low   Fatigue 10/14/2015    Priority: Low   Sleep difficulties 10/14/2015    Priority: Low   Paresthesia 12/09/2017   Chronic insomnia 11/24/2017   Insomnia secondary to chronic pain 11/24/2017   Complaint related to dreams 11/24/2017   REM sleep behavior disorder 11/24/2017   Neuropathy 08/18/2017   Ureter, 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 06/10/2017   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   Obesity, Class I, BMI 30-34.9 12/03/2016   Kidney stones 09/12/2016   Nausea 09/12/2016   High risk medication use 07/30/2016   Primary osteoarthritis of both feet 04/12/2016   History of kidney stones 04/12/2016   Primary osteoarthritis of both hands 04/11/2016   Bilateral primary osteoarthritis of hip 04/11/2016   Primary osteoarthritis of both knees 04/11/2016   Spondylosis of lumbar region without myelopathy or radiculopathy 04/11/2016   Flank pain 04/02/2016   Sinusitis 04/02/2016   Dysuria 11/11/2015   Hormone replacement therapy- per GYN 10/14/2015   h/o Hiatal hernia 10/14/2015   Counseling on health promotion and disease prevention 10/14/2015   Age-related nuclear cataract of both eyes 01/25/2015   Posterior vitreous detachment of left eye 01/25/2015   chronic Palpitations 07/19/2013     Current Meds  Medication Sig   CREAM BASE EX Apply 1 application topically daily. BIEST Transdermal Cream 0.5 mg: Compounded bio-identical Estriol (E3) combined by [facility]   escitalopram  (LEXAPRO) 10 MG tablet Take 10 mg by mouth at bedtime.   famotidine (PEPCID) 40 MG tablet Take 40 mg by mouth daily as needed (for heartburn/indigestion.).    Flaxseed, Linseed, (FLAX SEED OIL) 1000 MG CAPS Take 2,000 mg by mouth every evening.   hydrochlorothiazide (MICROZIDE) 12.5 MG capsule Take 12.5 mg by mouth daily.    ibuprofen (ADVIL,MOTRIN) 200 MG tablet Take 400 mg by mouth every 8 (eight) hours as needed (for pain.).   levothyroxine (SYNTHROID, LEVOTHROID) 112 MCG tablet Take 112 mcg by mouth daily before breakfast.   Magnesium 500 MG CAPS Take 1,000 mg by mouth every evening.   Misc Natural Products (TART CHERRY ADVANCED PO) Take 2,000 mg by mouth daily.  1000 mg per capsule   nitrofurantoin, macrocrystal-monohydrate, (MACROBID) 100 MG capsule Take 100 mg by mouth 2 (two) times daily.   NONFORMULARY OR COMPOUNDED ITEM Take 150 mg by mouth at bedtime. Bio identical Progesterone 150 ([facility])   Omega-3 Fatty Acids (FISH OIL) 1200 MG CAPS Take 2,400 mg by mouth every evening.   Vitamin D, Ergocalciferol, (DRISDOL) 1.25 MG (50000 UT) CAPS capsule TAKE 1 CAPSULE BY MOUTH EVERY 7 DAYS.   [DISCONTINUED] gabapentin (NEURONTIN) 300 MG capsule 1-3 ( - ) tabs at bedtime (Patient taking differently: 1-3 ( - ) tabs at bedtime prn)     Allergies:  Allergies  Allergen Reactions   Penicillins Hives and Itching    Has patient had a PCN reaction causing immediate rash, facial/tongue/throat swelling, SOB or lightheadedness with hypotension: No Has patient had a PCN reaction causing severe rash involving mucus membranes or skin necrosis: No Has patient had a PCN reaction that required hospitalization: No Has patient had a PCN reaction occurring within the last 10 years: No If all of the above answers are "NO", then may proceed with Cephalosporin use.    Sulfa Antibiotics Hives and Itching    Bactrim     ROS:  See above HPI for pertinent positives and  negatives   Objective:   Height  (1.651 m), weight 190 lb (86.2 kg). (if some vitals are omitted, this means that patient was UNABLE to obtain them even though asked to get them prior to OV today) General: sounds in no acute distress.  Skin: Pt confirms warm and dry  extremities and pink fingertips Respiratory: speaking in full sentences, no conversational dyspnea Psych: A and O *3, appears insight good, mood- full      Impression and Recommendations:      ICD-10-CM   1. Adjustment disorder with mixed anxiety and depressed mood F43.23   2. Sleep difficulties G47.9 traZODone (DESYREL) 50 MG tablet  3. 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 Other specified hypothyroidism E03.8   5. Hyperuricemia E79.0   6. Pseudogout involving multiple joints M11.89      -Mood/stress: Patient will continue on the 10 mg Lexapro daily.   Counseled patient on pathophysiology of disease and discussed various treatment options, which often includes dietary and lifestyle modifications as first line, in addition to discussing the risks and benefits of various medications.   Importance of healthy eating, getting adequate sleep, daily exercise and seeking the help of a professional counselor discussed.  Pt encouraged to call one for an appt for counseling.   Meditation and relaxation techniques discussed with patient.   Deep breathing/ Square breathing exercises reviewed.    Encouraged daily meditation and exercise. - Novel Covid -19 counseling done; all questions were answered.   - Current CDC and federal guidelines reviewed with patient  - Reminded pt of extreme importance of social distancing; minimizing contacts with others, avoiding ALL but emergency appts etc. - Told patient to be prepared, not scared; and be smart for the sake of others - told to call with any concerns  Sleep: We will DC the Neurontin since she was just using it for sleep and it did not work  well.  She states she does not need it for the original reason neuro started her on it.   She was taking Lunesta in the past which was given to her by [place] back in June and now, she wishes to try something else.  Stated it often had the opposite effect and stimulated her. - all possible meds/ med classes r/w pt that we generally use for insomnia -We will start her on trazodone.  Told her to start at 25 mg nightly and increase to 50 as tolerated and needed. - R/B d/c pt and Beer's list meds reviewed- discussed meaning of - discussed sleep hygiene and importance of this - encouraged exercise during day - will help sleep better  --Advised patient to follow-up with urology and continue her Macrobid 100 mg daily as they told her to.  I recommend she does not do self dosing and just do pulse dosing of taking it for 5 days every week or so.  Advised patient to please check with her specialist on this.  -Patient's hyperuricemia-since she had a side effect of colchicine, and she cannot take this, her rheumatologist is treating her for this and currently not on any medicine.  We did discuss hydration and exercise to help improve her various joint aches and pains.  -Also advised to follow-up with neurology for the OSA sleep study and evaluation per their recommendation.  Explained the importance of this and that her sleep may never be a restful sleep if she has obstructive sleep apnea that is not properly controlled.  -Advised patient that it is not best for her to make medication changes in dose changes on her own and that these should be made in conjunction with consultation with her various specialists and physicians.  -Advised patient we should do a complete yearly physical with fasting blood work-she will be due in June for this.  As part of my medical decision making, I reviewed the following data within the electronic MEDICAL RECORD NUMBER History obtained from pt/family, CMA notes reviewed and incorporated,  Labs reviewed, Radiograph/ tests reviewed if applicable and OV notes from prior OV's with me, as well as other specialists he has seen since seeing me last, were all reviewed and used in my medical decision making process today. Additionally, discussion had with patient regarding txmnt plan, their biases about that plan etc were used in my medical decision making today.  I discussed the assessment and treatment plan with the patient. The patient was provided an opportunity to ask questions and all were answered.  The patient agreed with the plan and demonstrated an understanding of the instructions.   No barriers to understanding were identified.  Red flag symptoms and signs discussed in detail.  Patient expressed understanding regarding what to do in case of emergency\urgent symptoms   The patient was advised to call back or seek an in-person evaluation if the symptoms worsen or if the condition fails to improve as anticipated.   Return for 3wks f/up - started trazadone for sleep, walking daily as goal, sleep meditation.    Meds ordered this encounter  Medications   traZODone (DESYREL) 50 MG tablet    Sig: Take 0.5-1 tablets (25-50 mg total) by mouth at bedtime as needed for sleep.    Dispense:  30 tablet    Refill:  0    Medications Discontinued During This Encounter  Medication Reason   gabapentin (NEURONTIN) 300 MG capsule       **Gross side effects, risk and benefits, and alternatives of medications and treatment plan in general discussed with patient.  Patient is aware that all medications have potential side effects and we are unable to predict every side effect or drug-drug interaction that may occur.   Patient was strongly encouraged to  call with any questions or concerns they may have concerns.     I provided 25+  minutes of non-face-to-face time during this encounter.   Thomasene Lot, DO

## 2018-05-12 ENCOUNTER — Other Ambulatory Visit: Payer: Self-pay | Admitting: Family Medicine

## 2018-05-13 ENCOUNTER — Ambulatory Visit (INDEPENDENT_AMBULATORY_CARE_PROVIDER_SITE_OTHER): Payer: BLUE CROSS/BLUE SHIELD | Admitting: Family Medicine

## 2018-05-13 ENCOUNTER — Other Ambulatory Visit: Payer: Self-pay

## 2018-05-13 ENCOUNTER — Encounter: Payer: Self-pay | Admitting: Family Medicine

## 2018-05-13 VITALS — Wt 190.0 lb

## 2018-05-13 DIAGNOSIS — F4323 Adjustment disorder with mixed anxiety and depressed mood: Secondary | ICD-10-CM | POA: Diagnosis not present

## 2018-05-13 DIAGNOSIS — E559 Vitamin D deficiency, unspecified: Secondary | ICD-10-CM

## 2018-05-13 DIAGNOSIS — G479 Sleep disorder, unspecified: Secondary | ICD-10-CM

## 2018-05-13 MED ORDER — TRAZODONE HCL 100 MG PO TABS: 100.0000 mg | ORAL_TABLET | Freq: Every evening | ORAL | 1 refills | Status: DC | PRN

## 2018-05-13 NOTE — Progress Notes (Signed)
Telehealth office visit note for Marissa Larson, D.O- at Primary Care at [facility]   I connected with current patient today and verified that I am speaking with the correct person using two identifiers.   . Location of the patient: Home . Location of the provider: Office Only the patient (+/- their family members at pt's discretion) and myself were participating in the encounter    - This visit type was conducted due to national recommendations for restrictions regarding the COVID-19 Pandemic (e.g. social distancing) in an effort to limit this patient's exposure and mitigate transmission in our community.  This format is felt to be most appropriate for this patient at this time.   - The patient did not have access to video technology or had technical difficulties with video requiring transitioning to audio format only. - No physical exam could be performed with this format, beyond that communicated to [place] by the patient/ family members as noted.   - Additionally my office staff/ schedulers discussed with the patient that there may be a monetary charge related to this service, depending on their medical insurance.   The patient expressed understanding, and agreed to proceed.       History of Present Illness:  Started on trazodone for sleep last OV 04/22/18.   No s-e - taking 50 mg.  Still having trouble falling asleep. Not doing sleep meditation regularly but when she does do it - works well to help her fall asleep.    Lexapro - taking 10 mg q hs still - no change from last time.   Having a Larson of stress at work- looking at being laid off even.     Impression and Recommendations:    1. Adjustment disorder with mixed anxiety and depressed mood   2. Sleep difficulties   3. Vitamin D deficiency     - meds and orders per below - As part of my medical decision making, I reviewed the following data within the electronic MEDICAL RECORD NUMBER History obtained from pt /family, CMA notes  reviewed and incorporated if applicable, Labs reviewed, Radiograph/ tests reviewed if applicable and OV notes from prior OV's with me, as well as other specialists she/he has seen since seeing me last, were all reviewed and used in my medical decision making process today.   - Additionally, discussion had with patient regarding txmnt plan, and their biases/concerns about that plan were used in my medical decision making today.   - The patient agreed with the plan and demonstrated an understanding of the instructions.   No barriers to understanding were identified.   - Red flag symptoms and signs discussed in detail.  Patient expressed understanding regarding what to do in case of emergency\ urgent symptoms.  The patient was advised to call back or seek an in-person evaluation if the symptoms worsen or if the condition fails to improve as anticipated.   Return for Chronic OV w me near future & FBW 2-3d prior.    No orders of the defined types were placed in this encounter.   Meds ordered this encounter  Medications  . DISCONTD: traZODone (DESYREL) 100 MG tablet    Sig: Take 1 tablet (100 mg total) by mouth at bedtime as needed for sleep.    Dispense:  90 tablet    Refill:  1    Medications Discontinued During This Encounter  Medication Reason  . traZODone (DESYREL) 50 MG tablet Reorder  I provided 19 minutes of non-face-to-face time during this encounter,with over 50% of the time in direct counseling on patients medical conditions/ medical concerns.  Additional time was spent with charting and coordination of care after the actual visit commenced.   Note:  This note was prepared with assistance of Dragon voice recognition software. Occasional wrong-word or sound-a-like substitutions may have occurred due to the inherent limitations of voice recognition software.  Marissa Lot, DO    -Vitals obtained; medications/ allergies reconciled;  personal medical, social, Sx  etc.histories were updated by CMA, reviewed by me and are reflected in chart   Patient Care Team    Relationship Specialty Notifications Start End  Marissa Lot, DO PCP - General Family Medicine  10/09/15   Marinus Maw, MD Consulting Physician Cardiology  10/09/15   Salvatore Marvel, MD Consulting Physician Orthopedic Surgery  10/09/15   Pollyann Savoy, MD Consulting Physician Rheumatology  10/09/15   [place], Casimiro Needle, MD Consulting Physician Endocrinology  10/09/15   Sharrell Ku, MD Consulting Physician Gastroenterology  10/09/15   Freddy Finner, MD Consulting Physician Obstetrics and Gynecology  03/13/17   Rene Paci, MD Consulting Physician Urology  03/13/17   Anson Fret, MD Consulting Physician Neurology  04/22/18      Patient Active Problem List   Diagnosis Date Noted  . NAFLD (nonalcoholic fatty liver disease) 40/98/1191    Priority: High  . Elevated LFTs 11/11/2015    Priority: High  . Adjustment disorder with mixed anxiety and depressed mood 11/11/2015    Priority: High  . Pseudogout involving multiple joints 10/14/2015    Priority: High  . HTN (hypertension) 10/14/2015    Priority: High  . Elevated HDL 11/11/2015    Priority: Medium  . Hypothyroidism 10/14/2015    Priority: Medium  . Generalized OA 10/14/2015    Priority: Medium  . 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 Hyperuricemia 04/11/2016    Priority: Low  . Primary insomnia 04/11/2016    Priority: Low  . Vitamin D deficiency 10/14/2015    Priority: Low  . Fatigue 10/14/2015    Priority: Low  . Sleep difficulties 10/14/2015    Priority: Low  . Paresthesia 12/09/2017  . Chronic insomnia 11/24/2017  . Insomnia secondary to chronic pain 11/24/2017  . Complaint related to dreams 11/24/2017  . 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 Neuropathy 08/18/2017  . 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 Obesity, Class I, BMI 30-34.9 12/03/2016  . Kidney stones 09/12/2016  . Nausea 09/12/2016  . High risk medication use 07/30/2016  . Primary osteoarthritis of both feet 04/12/2016  . History of kidney stones 04/12/2016  . Primary osteoarthritis of both hands 04/11/2016  . Bilateral primary osteoarthritis of hip 04/11/2016  . Primary osteoarthritis of both knees 04/11/2016  . Spondylosis of lumbar region without myelopathy or radiculopathy 04/11/2016  . Flank pain 04/02/2016  . Sinusitis 04/02/2016  . Dysuria 11/11/2015  . Hormone replacement therapy- per GYN 10/14/2015  . h/o Hiatal hernia 10/14/2015  . Counseling on health promotion and disease prevention 10/14/2015  . Age-related nuclear cataract of both eyes 01/25/2015  . Posterior vitreous detachment of left eye 01/25/2015  . chronic Palpitations 07/19/2013     Current Meds  Medication Sig  . CREAM BASE EX Apply 1 application topically daily. BIEST Transdermal Cream 0.5 mg: Compounded bio-identical  Estriol (E3) combined by [facility]  . escitalopram (LEXAPRO) 10 MG tablet Take 10 mg by mouth at bedtime.  . famotidine (PEPCID) 40 MG tablet Take 40 mg by mouth daily as needed (for heartburn/indigestion.).   Marland Kitchen. Flaxseed, Linseed, (FLAX SEED OIL) 1000 MG CAPS Take 2,000 mg by mouth every evening.  . hydrochlorothiazide (MICROZIDE) 12.5 MG capsule Take 12.5 mg by mouth daily.   Marland Kitchen. ibuprofen (ADVIL,MOTRIN) 200 MG tablet Take 400 mg by mouth every 8 (eight) hours as needed (for pain.).  Marland Kitchen. levothyroxine (SYNTHROID, LEVOTHROID) 112 MCG tablet Take 112 mcg by mouth daily before breakfast.  . Magnesium 500 MG CAPS Take 1,000 mg by mouth every evening.  . Misc Natural Products (TART CHERRY ADVANCED PO) Take 2,000 mg by mouth daily. 1000 mg per capsule  . nitrofurantoin, macrocrystal-monohydrate, (MACROBID) 100 MG capsule Take 100 mg by mouth 2 (two) times daily.  . NONFORMULARY OR  COMPOUNDED ITEM Take 150 mg by mouth at bedtime. Bio identical Progesterone 150 ([facility])  . Omega-3 Fatty Acids (FISH OIL) 1200 MG CAPS Take 2,400 mg by mouth every evening.  . Vitamin D, Ergocalciferol, (DRISDOL) 1.25 MG (50000 UT) CAPS capsule TAKE 1 CAPSULE BY MOUTH EVERY 7 DAYS.  . [DISCONTINUED] traZODone (DESYREL) 100 MG tablet Take 1 tablet (100 mg total) by mouth at bedtime as needed for sleep.  . [DISCONTINUED] traZODone (DESYREL) 50 MG tablet Take 0.5-1 tablets (25-50 mg total) by mouth at bedtime as needed for sleep. (Patient taking differently: Take 50 mg by mouth at bedtime as needed for sleep. )     Allergies:  Allergies  Allergen Reactions  . Penicillins Hives and Itching    Has patient had a PCN reaction causing immediate rash, facial/tongue/throat swelling, SOB or lightheadedness with hypotension: No Has patient had a PCN reaction causing severe rash involving mucus membranes or skin necrosis: No Has patient had a PCN reaction that required hospitalization: No Has patient had a PCN reaction occurring within the last 10 years: No If all of the above answers are "NO", then may proceed with Cephalosporin use.   . Sulfa Antibiotics Hives and Itching    Bactrim     ROS:  See above HPI for pertinent positives and negatives   Objective:   Weight 190 lb (86.2 kg).  (if some vitals are omitted, this means that patient was UNABLE to obtain them even though they were asked to get them prior to OV today.  They were asked to call us at their earliest convenience with these once obtained. )  General: A & O * 3; sounds in no acute distress; in usual state of health.  Skin: Pt confirms warm and dry extremities and pink fingertips HEENT: Pt confirms lips non-cyanotic Chest: Patient confirms normal chest excursion and movement Respiratory: speaking in full sentences, no conversational dyspnea; patient confirms no use of accessory muscles Psych: insight appears good,  mood- appears full

## 2018-05-14 ENCOUNTER — Other Ambulatory Visit: Payer: Self-pay | Admitting: Rheumatology

## 2018-05-14 NOTE — Telephone Encounter (Signed)
Last Visit: 12/15/17 Next visit: 05/18/18 Labs: 02/10/18 ALT is elevated-37. Glucose is elevated-113. Rest of lab work is WNL.  Okay to refill per Dr. Corliss Skains

## 2018-05-17 ENCOUNTER — Telehealth: Payer: Self-pay | Admitting: Family Medicine

## 2018-05-17 NOTE — Telephone Encounter (Signed)
Thanks Dondra Spry- what worked?  Has she had improved mood?  Sleep?   Or both?    -Thanks for expounding!  Dr Val Eagle

## 2018-05-17 NOTE — Telephone Encounter (Signed)
Patient states advice Dr. Sharee Holster gave her work!!!!... She states to share [facility]!!!!!!!!  -- Forwarding message to Dr. Val Eagle.  --glh

## 2018-05-18 ENCOUNTER — Ambulatory Visit: Payer: Self-pay | Admitting: Physician Assistant

## 2018-05-25 ENCOUNTER — Telehealth: Payer: Self-pay

## 2018-05-25 DIAGNOSIS — G479 Sleep disorder, unspecified: Secondary | ICD-10-CM

## 2018-05-25 MED ORDER — TRAZODONE HCL 50 MG PO TABS: 75.0000 mg | ORAL_TABLET | Freq: Every evening | ORAL | 0 refills | Status: DC | PRN

## 2018-05-25 NOTE — Telephone Encounter (Signed)
Patient called and states that the Trazodone 100 mg is making her feel drowsy the next day and that the 50 mg did not help with getting her to sleep at night.  Per Dr. Sharee Holster sent in new RX for 50 mg sig 1 1/2 tabs nightly and to follow up with [place] with how this works for her.  RX sent in and patient notified. MPulliam, CMA/RT(R)

## 2018-06-02 DIAGNOSIS — H2513 Age-related nuclear cataract, bilateral: Secondary | ICD-10-CM | POA: Diagnosis not present

## 2018-06-02 DIAGNOSIS — H52203 Unspecified astigmatism, bilateral: Secondary | ICD-10-CM | POA: Diagnosis not present

## 2018-06-02 DIAGNOSIS — H524 Presbyopia: Secondary | ICD-10-CM | POA: Diagnosis not present

## 2018-06-06 DIAGNOSIS — S838X2A Sprain of other specified parts of left knee, initial encounter: Secondary | ICD-10-CM | POA: Diagnosis not present

## 2018-06-06 DIAGNOSIS — S63501A Unspecified sprain of right wrist, initial encounter: Secondary | ICD-10-CM | POA: Diagnosis not present

## 2018-06-16 ENCOUNTER — Telehealth: Payer: Self-pay | Admitting: Family Medicine

## 2018-06-16 NOTE — Telephone Encounter (Signed)
Patient called to set up Lab appt---forwarding message to med asst that no orders are listed in pt's chart for Dr.Opalski.  --Pt coming tomorrow 6/11 @ 9:45am.  --glh

## 2018-06-17 ENCOUNTER — Other Ambulatory Visit: Payer: Self-pay

## 2018-06-17 ENCOUNTER — Other Ambulatory Visit: Payer: BC Managed Care – PPO

## 2018-06-17 DIAGNOSIS — Z0000 Encounter for general adult medical examination without abnormal findings: Secondary | ICD-10-CM

## 2018-06-17 DIAGNOSIS — E039 Hypothyroidism, unspecified: Secondary | ICD-10-CM

## 2018-06-17 DIAGNOSIS — I10 Essential (primary) hypertension: Secondary | ICD-10-CM | POA: Diagnosis not present

## 2018-06-17 DIAGNOSIS — E559 Vitamin D deficiency, unspecified: Secondary | ICD-10-CM

## 2018-06-17 DIAGNOSIS — R945 Abnormal results of liver function studies: Secondary | ICD-10-CM | POA: Diagnosis not present

## 2018-06-17 DIAGNOSIS — R7989 Other specified abnormal findings of blood chemistry: Secondary | ICD-10-CM

## 2018-06-18 LAB — TSH: TSH: 1.7 u[IU]/mL (ref 0.450–4.500)

## 2018-06-18 LAB — CBC WITH DIFFERENTIAL/PLATELET
Basophils Absolute: 0.1 10*3/uL (ref 0.0–0.2)
Basos: 1 %
EOS (ABSOLUTE): 0.1 10*3/uL (ref 0.0–0.4)
Eos: 1 %
Hematocrit: 44.4 % (ref 34.0–46.6)
Hemoglobin: 14.6 g/dL (ref 11.1–15.9)
Immature Grans (Abs): 0 10*3/uL (ref 0.0–0.1)
Immature Granulocytes: 0 %
Lymphocytes Absolute: 2.5 10*3/uL (ref 0.7–3.1)
Lymphs: 31 %
MCH: 31.6 pg (ref 26.6–33.0)
MCHC: 32.9 g/dL (ref 31.5–35.7)
MCV: 96 fL (ref 79–97)
Monocytes Absolute: 0.6 10*3/uL (ref 0.1–0.9)
Monocytes: 8 %
Neutrophils Absolute: 4.9 10*3/uL (ref 1.4–7.0)
Neutrophils: 59 %
Platelets: 227 10*3/uL (ref 150–450)
RBC: 4.62 x10E6/uL (ref 3.77–5.28)
RDW: 13.1 % (ref 11.7–15.4)
WBC: 8.2 10*3/uL (ref 3.4–10.8)

## 2018-06-18 LAB — COMPREHENSIVE METABOLIC PANEL
ALT: 21 IU/L (ref 0–32)
AST: 24 IU/L (ref 0–40)
Albumin/Globulin Ratio: 2 (ref 1.2–2.2)
Albumin: 4.3 g/dL (ref 3.8–4.9)
Alkaline Phosphatase: 84 IU/L (ref 39–117)
BUN/Creatinine Ratio: 30 — ABNORMAL HIGH (ref 12–28)
BUN: 16 mg/dL (ref 8–27)
Bilirubin Total: 0.4 mg/dL (ref 0.0–1.2)
CO2: 26 mmol/L (ref 20–29)
Calcium: 9.2 mg/dL (ref 8.7–10.3)
Chloride: 102 mmol/L (ref 96–106)
Creatinine, Ser: 0.54 mg/dL — ABNORMAL LOW (ref 0.57–1.00)
GFR calc Af Amer: 119 mL/min/{1.73_m2} (ref >59)
GFR calc non Af Amer: 103 mL/min/{1.73_m2} (ref >59)
Globulin, Total: 2.2 g/dL (ref 1.5–4.5)
Glucose: 99 mg/dL (ref 65–99)
Potassium: 4.2 mmol/L (ref 3.5–5.2)
Sodium: 142 mmol/L (ref 134–144)
Total Protein: 6.5 g/dL (ref 6.0–8.5)

## 2018-06-18 LAB — LIPID PANEL
Chol/HDL Ratio: 2.7 ratio (ref 0.0–4.4)
Cholesterol, Total: 223 mg/dL — ABNORMAL HIGH (ref 100–199)
HDL: 84 mg/dL (ref >39)
LDL Calculated: 119 mg/dL — ABNORMAL HIGH (ref 0–99)
Triglycerides: 98 mg/dL (ref 0–149)
VLDL Cholesterol Cal: 20 mg/dL (ref 5–40)

## 2018-06-18 LAB — HEMOGLOBIN A1C
Est. average glucose Bld gHb Est-mCnc: 117 mg/dL
Hgb A1c MFr Bld: 5.7 % — ABNORMAL HIGH (ref 4.8–5.6)

## 2018-06-18 LAB — T3, FREE: T3, Free: 3.1 pg/mL (ref 2.0–4.4)

## 2018-06-18 LAB — VITAMIN D 25 HYDROXY (VIT D DEFICIENCY, FRACTURES): Vit D, 25-Hydroxy: 31.4 ng/mL (ref 30.0–100.0)

## 2018-06-18 LAB — T4, FREE: Free T4: 1.37 ng/dL (ref 0.82–1.77)

## 2018-06-18 NOTE — Progress Notes (Signed)
CBC WNL

## 2018-07-19 ENCOUNTER — Other Ambulatory Visit: Payer: Self-pay

## 2018-07-19 ENCOUNTER — Ambulatory Visit (INDEPENDENT_AMBULATORY_CARE_PROVIDER_SITE_OTHER): Payer: BC Managed Care – PPO | Admitting: Family Medicine

## 2018-07-19 ENCOUNTER — Encounter: Payer: Self-pay | Admitting: Family Medicine

## 2018-07-19 VITALS — BP 134/76

## 2018-07-19 DIAGNOSIS — G479 Sleep disorder, unspecified: Secondary | ICD-10-CM

## 2018-07-19 DIAGNOSIS — R945 Abnormal results of liver function studies: Secondary | ICD-10-CM

## 2018-07-19 DIAGNOSIS — E7800 Pure hypercholesterolemia, unspecified: Secondary | ICD-10-CM | POA: Diagnosis not present

## 2018-07-19 DIAGNOSIS — E559 Vitamin D deficiency, unspecified: Secondary | ICD-10-CM

## 2018-07-19 DIAGNOSIS — E039 Hypothyroidism, unspecified: Secondary | ICD-10-CM

## 2018-07-19 DIAGNOSIS — F4323 Adjustment disorder with mixed anxiety and depressed mood: Secondary | ICD-10-CM

## 2018-07-19 DIAGNOSIS — M1189 Other specified crystal arthropathies, multiple sites: Secondary | ICD-10-CM

## 2018-07-19 DIAGNOSIS — K760 Fatty (change of) liver, not elsewhere classified: Secondary | ICD-10-CM

## 2018-07-19 DIAGNOSIS — E7889 Other lipoprotein metabolism disorders: Secondary | ICD-10-CM

## 2018-07-19 DIAGNOSIS — I10 Essential (primary) hypertension: Secondary | ICD-10-CM | POA: Diagnosis not present

## 2018-07-19 DIAGNOSIS — R7303 Prediabetes: Secondary | ICD-10-CM

## 2018-07-19 DIAGNOSIS — R7989 Other specified abnormal findings of blood chemistry: Secondary | ICD-10-CM

## 2018-07-19 MED ORDER — VITAMIN D 50 MCG (2000 UT) PO CAPS: ORAL_CAPSULE | ORAL | Status: DC

## 2018-07-19 NOTE — Progress Notes (Signed)
Telehealth office visit note for Marissa Larson, D.O- at Primary Care at [facility]   I connected with current patient today and verified that I am speaking with the correct person using two identifiers.   . Location of the patient: Home . Location of the provider: Office Only the patient (+/- their family members at pt's discretion) and myself were participating in the encounter    - This visit type was conducted due to national recommendations for restrictions regarding the COVID-19 Pandemic (e.g. social distancing) in an effort to limit this patient's exposure and mitigate transmission in our community.  This format is felt to be most appropriate for this patient at this time.   - The patient did not have access to video technology or had technical difficulties with video requiring transitioning to audio format only. - No physical exam could be performed with this format, beyond that communicated to [place] by the patient/ family members as noted.   - Additionally my office staff/ schedulers discussed with the patient that there may be a monetary charge related to this service, depending on their medical insurance.   The patient expressed understanding, and agreed to proceed.       History of Present Illness:  -Patient recently had full set of fasting blood work here to review   Sleep difficulties: Trazodone working well for her-  tabs nightly; no s-e.    Mood- only  lexapro.  Feels like the trazodone with it works well.    Some slight dizziness at night- "just feeling a little off" - ever since she was changed to diclofenac by her Rheum doc recently.  Denies room spinning, nausea visual changes or focal neurological symptoms.  She is not checking her blood pressure at home, nor during these times.   Not sure if ensuing event such as not eating for long periods of time etc are making this occur.  Patient has no other complaints though.  Negative ROS BP:    Not taking it at  home at all.  Per patient "I am not on BP meds for HTN, but was given it by my urologist to help flush my kidneys."    - takes it daily w/o s-e, on nothing else for BP  Cholesterol: .The 10-year ASCVD risk score Denman George DC Montez Hageman., et al., 2013) is: 4.5%   Values used to calculate the score:     Age: 32 years     Sex: Female     Is Non-Hispanic African American: No     Diabetic: No     Tobacco smoker: No     Systolic Blood Pressure: 134 mmHg     Is BP treated: Yes     HDL Cholesterol: 84 mg/dL     Total Cholesterol: 223 mg/dL    Recent Results (from the past 2160 hour(s))  T3, free     Status: None   Collection Time: 06/17/18 10:07 AM  Result Value Ref Range   T3, Free 3.1 2.0 - 4.4 pg/mL  T4, free     Status: None   Collection Time: 06/17/18 10:07 AM  Result Value Ref Range   Free T4 1.37 0.82 - 1.77 ng/dL  TSH     Status: None   Collection Time: 06/17/18 10:07 AM  Result Value Ref Range   TSH 1.700 0.450 - 4.500 uIU/mL  VITAMIN D 25 Hydroxy (Vit-D Deficiency, Fractures)     Status: None   Collection Time: 06/17/18 10:07 AM  Result Value Ref Range   Vit D, 25-Hydroxy 31.4 30.0 - 100.0 ng/mL    Comment: Vitamin D deficiency has been defined by the Institute of Medicine and an Endocrine Society practice guideline as a level of serum 25-OH vitamin D less than 20 ng/mL (1,2). The Endocrine Society went on to further define vitamin D insufficiency as a level between 21 and 29 ng/mL (2). 1. IOM (Institute of Medicine). 2010. Dietary reference    intakes for calcium and D. [place]: The    [employer]. 2. Holick MF, Binkley Taloga, Bischoff-Ferrari HA, et al.    Evaluation, treatment, and prevention of vitamin D    deficiency: an Endocrine Society clinical practice    guideline. JCEM. 2011 Jul; 96(7):1911-30.   Lipid panel     Status: Abnormal   Collection Time: 06/17/18 10:07 AM  Result Value Ref Range   Cholesterol, Total 223 (H) 100 - 199 mg/dL   Triglycerides  98 0 - 149 mg/dL   HDL 84     161102 >09>39 mg/dL   VLDL Cholesterol Cal 20 5 - 40 mg/dL   LDL Calculated 604119 (H)   101 0 - 99 mg/dL   Chol/HDL Ratio 2.7 0.0 - 4.4 ratio    Comment:                                   T. Chol/HDL Ratio                                             Men  Women                               1/2 Avg.Risk  3.4    3.3                                   Avg.Risk  5.0    4.4                                2X Avg.Risk  9.6    7.1                                3X Avg.Risk 23.4   11.0   Hemoglobin A1c     Status: Abnormal   Collection Time: 06/17/18 10:07 AM  Result Value Ref Range   Hgb A1c MFr Bld 5.7 (H)   5.7 in past 6/19 4.8 - 5.6 %    Comment:          Prediabetes: 5.7 - 6.4          Diabetes: >6.4          Glycemic control for adults with diabetes: <7.0    Est. average glucose Bld gHb Est-mCnc 117 mg/dL  Comprehensive metabolic panel     Status: Abnormal   Collection Time: 06/17/18 10:07 AM  Result Value Ref Range   Glucose 99 65 - 99 mg/dL   BUN 16 8 - 27 mg/dL   Creatinine, Ser 5.400.54 (L) 0.57 - 1.00 mg/dL  GFR calc non Af Amer 103 >59 mL/min/1.73   GFR calc Af Amer 119 >59 mL/min/1.73   BUN/Creatinine Ratio 30 (H) 12 - 28   Sodium 142 134 - 144 mmol/L   Potassium 4.2 3.5 - 5.2 mmol/L   Chloride 102 96 - 106 mmol/L   CO2 26 20 - 29 mmol/L   Calcium 9.2 8.7 - 10.3 mg/dL   Total Protein 6.5 6.0 - 8.5 g/dL   Albumin 4.3 3.8 - 4.9 g/dL   Globulin, Total 2.2 1.5 - 4.5 g/dL   Albumin/Globulin Ratio 2.0 1.2 - 2.2   Bilirubin Total 0.4 0.0 - 1.2 mg/dL   Alkaline Phosphatase 84 39 - 117 IU/L   AST 24 0 - 40 IU/L   ALT 21 0 - 32 IU/L  CBC with Differential/Platelet     Status: None   Collection Time: 06/17/18 10:07 AM  Result Value Ref Range   WBC 8.2 3.4 - 10.8 x10E3/uL   RBC 4.62 3.77 - 5.28 x10E6/uL   Hemoglobin 14.6 11.1 - 15.9 g/dL   Hematocrit 16.144.4 09.634.0 - 46.6 %   MCV 96 79 - 97 fL   MCH 31.6 26.6 - 33.0 pg   MCHC 32.9 31.5 - 35.7 g/dL   RDW 04.513.1  40.911.7 - 81.115.4 %   Platelets 227 150 - 450 x10E3/uL   Neutrophils 59 Not Estab. %   Lymphs 31 Not Estab. %   Monocytes 8 Not Estab. %   Eos 1 Not Estab. %   Basos 1 Not Estab. %   Neutrophils Absolute 4.9 1.4 - 7.0 x10E3/uL   Lymphocytes Absolute 2.5 0.7 - 3.1 x10E3/uL   Monocytes Absolute 0.6 0.1 - 0.9 x10E3/uL   EOS (ABSOLUTE) 0.1 0.0 - 0.4 x10E3/uL   Basophils Absolute 0.1 0.0 - 0.2 x10E3/uL   Immature Granulocytes 0 Not Estab. %   Immature Grans (Abs) 0.0 0.0 - 0.1 x10E3/uL     Impression and Recommendations:    1. Hypertension, unspecified type   2. Elevated LDL cholesterol level   3. Elevated HDL   4. Prediabetes   5. Pseudogout involving multiple joints   6. NAFLD (nonalcoholic fatty liver disease)   7. Elevated LFTs   8. Adjustment disorder with mixed anxiety and depressed mood   9. Hypothyroidism, unspecified type   10. Vitamin D deficiency   11. Sleep difficulties     -Reviewed all labs with patient today that were recently done.  Highlights of labs:  Cholesterol: LDL up slightly and HDL down but still well controlled and if blood pressure were consistent with what it has run in the past, she would not need medicines and ten-year risk of be less than 5%. ---> Highly advised to have patient check her blood pressure daily at home since she has not been checking at all.  Reviewed blood pressure goals consistently less than 140/90 on a daily basis.  She will call sooner than planned follow-up if blood pressure remains up at home or too low at night causing dizziness etc. if the symptoms worsen, she will let us know.  Told patient try to identify if there is any ensuing event or if it is just with movement of the head too quickly or bending over etc.  Patient states that she will pay attention and let us know -Vitamin D still too low and lower than prior.  She tells me she is taking her once weekly vitamin D and she will add a 2000 IUs daily to it.  Recheck 3 to 4 months -LFTs  are improved from prior times.  Patient feels good about this. -Prediabetes: A1c steady at 5.7-no change from prior.  -Sleep: Tolerating trazodone well at 75 mg nightly.  Well rested in the a.m.'s with no side effect.  She will continue this.  Mood:  Well controlled on Lexapro 10 mg.  She feels it was slightly enhanced with the addition of the trazodone at night for sleep.  Mood stable feels well  -We will bring patient back in 3 months and recheck a vitamin D level then have an office visit 3 to 5 days later. -She will follow-up sooner than planned if blood pressures not well controlled at home to goal of less than 140/90 on a regular basis.  She was told to check 3-4 times weekly and if she develops any symptoms.  - As part of my medical decision making, I reviewed the following data within the electronic MEDICAL RECORD NUMBER History obtained from pt /family, CMA notes reviewed and incorporated if applicable, Labs reviewed, Radiograph/ tests reviewed if applicable and OV notes from prior OV's with me, as well as other specialists she/he has seen since seeing me last, were all reviewed and used in my medical decision making process today.   - Additionally, discussion had with patient regarding txmnt plan, and their biases/concerns about that plan were used in my medical decision making today.   - The patient agreed with the plan and demonstrated an understanding of the instructions.   No barriers to understanding were identified.   - Red flag symptoms and signs discussed in detail.  Patient expressed understanding regarding what to do in case of emergency\ urgent symptoms.  The patient was advised to call back or seek an in-person evaluation if the symptoms worsen or if the condition fails to improve as anticipated.   Return for 3 months vit D level and BP log.    Meds ordered this encounter  Medications  . Cholecalciferol (VITAMIN D) 50 MCG (2000 UT) CAPS    Sig: 2,000 daily OTC in addition to  once weekly prescription    Dispense:  30 capsule    Medications Discontinued During This Encounter  Medication Reason  . nitrofurantoin, macrocrystal-monohydrate, (MACROBID) 100 MG capsule Completed Course     I provided 22 minutes of non-face-to-face time during this encounter,with over 50% of the time in direct counseling on patients medical conditions/ medical concerns.  Additional time was spent with charting and coordination of care after the actual visit commenced.   Note:  This note was prepared with assistance of Dragon voice recognition software. Occasional wrong-word or sound-a-like substitutions may have occurred due to the inherent limitations of voice recognition software.  Marissa Loteborah Shivaan Tierno, DO 07/19/2018 4:11 PM    Patient Care Team    Relationship Specialty Notifications Start End  Marissa Lotpalski, Amyrah Pinkhasov, DO PCP - General Family Medicine  10/09/15   Marinus Mawaylor, Gregg W, MD Consulting Physician Cardiology  10/09/15   Salvatore MarvelWainer, Robert, MD Consulting Physician Orthopedic Surgery  10/09/15   Pollyann Savoyeveshwar, Shaili, MD Consulting Physician Rheumatology  10/09/15   Altheimer, Casimiro NeedleMichael, MD Consulting Physician Endocrinology  10/09/15   Sharrell KuMedoff, Jeffrey, MD Consulting Physician Gastroenterology  10/09/15   Freddy FinnerNeal, W Ronald, MD Consulting Physician Obstetrics and Gynecology  03/13/17   Rene PaciWinter, Christopher Aaron, MD Consulting Physician Urology  03/13/17   Anson FretAhern, Antonia B, MD Consulting Physician Neurology  04/22/18      -Vitals obtained; medications/ allergies reconciled;  personal medical, social, Sx etc.histories  were updated by CMA, reviewed by me and are reflected in chart   Patient Active Problem List   Diagnosis Date Noted  . NAFLD (nonalcoholic fatty liver disease) 07/30/2016    Priority: High  . Elevated LFTs 11/11/2015    Priority: High  . Adjustment disorder with mixed anxiety and depressed mood 11/11/2015    Priority: High  . Pseudogout involving multiple joints 10/14/2015    Priority:  High  . HTN (hypertension) 10/14/2015    Priority: High  . Elevated HDL 11/11/2015    Priority: Medium  . Hypothyroidism 10/14/2015    Priority: Medium  . Generalized OA 10/14/2015    Priority: Medium  . 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 Hyperuricemia 04/11/2016    Priority: Low  . Primary insomnia 04/11/2016    Priority: Low  . Vitamin D deficiency 10/14/2015    Priority: Low  . Fatigue 10/14/2015    Priority: Low  . Sleep difficulties 10/14/2015    Priority: Low  . Elevated LDL cholesterol level 07/19/2018  . Prediabetes 07/19/2018  . Paresthesia 12/09/2017  . Chronic insomnia 11/24/2017  . Insomnia secondary to chronic pain 11/24/2017  . Complaint related to dreams 11/24/2017  . 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 Neuropathy 08/18/2017  . 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 Obesity, Class I, BMI 30-34.9 12/03/2016  . Kidney stones 09/12/2016  . Nausea 09/12/2016  . High risk medication use 07/30/2016  . Primary osteoarthritis of both feet 04/12/2016  . History of kidney stones 04/12/2016  . Primary osteoarthritis of both hands 04/11/2016  . Bilateral primary osteoarthritis of hip 04/11/2016  . Primary osteoarthritis of both knees 04/11/2016  . Spondylosis of lumbar region without myelopathy or radiculopathy 04/11/2016  . Flank pain 04/02/2016  . Sinusitis 04/02/2016  . Dysuria 11/11/2015  . Hormone replacement therapy- per GYN 10/14/2015  . h/o Hiatal hernia 10/14/2015  . Counseling on health promotion and disease prevention 10/14/2015  . Age-related nuclear cataract of both eyes 01/25/2015  . Posterior vitreous detachment of left eye 01/25/2015  . chronic Palpitations 07/19/2013     Current Meds  Medication Sig  . COLCRYS 0.6 MG tablet TAKE 1 TABLET BY MOUTH 2 TIMES DAILY.  Marland Kitchen CREAM BASE EX Apply 1 application topically daily.  BIEST Transdermal Cream 0.5 mg: Compounded bio-identical Estriol (E3) combined by Perry  . diclofenac (VOLTAREN) 50 MG EC tablet Take 1 tablet by mouth daily.  Marland Kitchen escitalopram (LEXAPRO) 10 MG tablet Take 10 mg by mouth at bedtime.  . famotidine (PEPCID) 40 MG tablet Take 40 mg by mouth daily as needed (for heartburn/indigestion.).   Marland Kitchen Flaxseed, Linseed, (FLAX SEED OIL) 1000 MG CAPS Take 2,000 mg by mouth every evening.  . hydrochlorothiazide (MICROZIDE) 12.5 MG capsule Take 12.5 mg by mouth daily.   Marland Kitchen ibuprofen (ADVIL,MOTRIN) 200 MG tablet Take 400 mg by mouth every 8 (eight) hours as needed (for pain.).  Marland Kitchen levothyroxine (SYNTHROID, LEVOTHROID) 112 MCG tablet Take 112 mcg by mouth daily before breakfast.  . Magnesium 500 MG CAPS Take 1,000 mg by mouth every evening.  . Misc Natural Products (TART CHERRY ADVANCED PO) Take 2,000 mg by mouth daily. 1000 mg per capsule  . nabumetone (RELAFEN) 500 MG tablet Take 1 tablet (500 mg total) by mouth daily.  . NONFORMULARY OR COMPOUNDED ITEM Take 150 mg by mouth  at bedtime. Bio identical Progesterone 150 ([facility])  . Omega-3 Fatty Acids (FISH OIL) 1200 MG CAPS Take 2,400 mg by mouth every evening.  . traZODone (DESYREL) 50 MG tablet Take 1.5 tablets (75 mg total) by mouth at bedtime as needed for sleep.  . Vitamin D, Ergocalciferol, (DRISDOL) 1.25 MG (50000 UT) CAPS capsule TAKE 1 CAPSULE BY MOUTH EVERY 7 DAYS.     Allergies:  Allergies  Allergen Reactions  . Penicillins Hives and Itching    Has patient had a PCN reaction causing immediate rash, facial/tongue/throat swelling, SOB or lightheadedness with hypotension: No Has patient had a PCN reaction causing severe rash involving mucus membranes or skin necrosis: No Has patient had a PCN reaction that required hospitalization: No Has patient had a PCN reaction occurring within the last 10 years: No If all of the above answers are "NO", then may proceed with Cephalosporin use.    . Sulfa Antibiotics Hives and Itching    Bactrim     ROS:  See above HPI for pertinent positives and negatives   Objective:   Blood pressure 134/76.  (if some vitals are omitted, this means that patient was UNABLE to obtain them even though they were asked to get them prior to OV today.  They were asked to call us at their earliest convenience with these once obtained) General: A & O * 3; sounds in no acute distress; in usual state of health.  Skin: Pt confirms warm and dry extremities and pink fingertips HEENT: Pt confirms lips non-cyanotic Chest: Patient confirms normal chest excursion and movement Respiratory: speaking in full sentences, no conversational dyspnea; patient confirms no use of accessory muscles Psych: insight appears good, mood- appears full

## 2018-08-04 ENCOUNTER — Other Ambulatory Visit: Payer: Self-pay | Admitting: Family Medicine

## 2018-08-05 DIAGNOSIS — S63592A Other specified sprain of left wrist, initial encounter: Secondary | ICD-10-CM | POA: Diagnosis not present

## 2018-08-05 DIAGNOSIS — M1712 Unilateral primary osteoarthritis, left knee: Secondary | ICD-10-CM | POA: Diagnosis not present

## 2018-08-06 ENCOUNTER — Telehealth: Payer: Self-pay | Admitting: *Deleted

## 2018-08-06 NOTE — Telephone Encounter (Signed)
   [facility] Pre-operative Risk Assessment    Request for surgical clearance:  1. What type of surgery is being performed? LEFT TOTAL KNEE REPLACEMENT    2. When is this surgery scheduled? TBD   3. What type of clearance is required (medical clearance vs. Pharmacy clearance to hold med vs. Both)? MEDICAL  4. Are there any medications that need to be held prior to surgery and how long? NONE LISTED   5. Practice name and name of physician performing surgery? [facility]; DR. Derrill Memo  6. What is your office phone number [phone] [name]    7.   What is your office fax number [phone]  8.   Anesthesia type (None, local, MAC, general) ? CHOICE   Julaine Hua 08/06/2018, 3:02 PM  _________________________________________________________________   (provider comments below)

## 2018-08-06 NOTE — Telephone Encounter (Signed)
   Primary Cardiologist: New (last seen by Dr. Lovena Le in 2016)  Chart reviewed as part of pre-operative protocol coverage. Because of Lunetta Marina past medical history and time since last visit, he/she will require a follow-up visit in order to better assess preoperative cardiovascular risk.  Pre-op covering staff: - Please schedule appointment and call patient to inform them. - Please contact requesting surgeon's office via preferred method (i.e, phone, fax) to inform them of need for appointment prior to surgery.  If applicable, this message will also be routed to pharmacy pool and/or primary cardiologist for input on holding anticoagulant/antiplatelet agent as requested below so that this information is available at time of patient's appointment.   Lyda Jester, PA-C  08/06/2018, 4:10 PM

## 2018-08-06 NOTE — Telephone Encounter (Signed)
Lvm for patient to call back to set up cardiac clearance appointment with one of Dr. Loma Boston

## 2018-08-09 NOTE — Telephone Encounter (Signed)
Pt has an appointment for surgical clearance 08/27/2018.  Will fax back to the requesting surgeon's office to make them aware.

## 2018-08-26 DIAGNOSIS — M25532 Pain in left wrist: Secondary | ICD-10-CM | POA: Diagnosis not present

## 2018-08-26 DIAGNOSIS — M25531 Pain in right wrist: Secondary | ICD-10-CM | POA: Diagnosis not present

## 2018-08-26 NOTE — Progress Notes (Signed)
Electrophysiology Office Note Date: 08/27/2018  ID:  Marissa Larson, DOB 10/09/1955, MRN [id number]  PCP: Mellody Dance, DO Electrophysiologist: Lovena Le  CC: surgical clearance  Marissa Larson is a 63 y.o. female seen today for Dr Lovena Le.  She presents today for routine electrophysiology followup.  Since last being seen in our clinic, the patient reports doing relatively well.  She walks about 20 minutes each day without chest pain or shortness of breath. She is able to do housework and yardwork but is limited by knee pain.  She has occasional PVC's.  She also has occasional sharp pains that are noticeable when she lays down.   She denies dyspnea, PND, orthopnea, nausea, vomiting, dizziness, syncope, edema, weight gain, or early satiety.  Past Medical History:  Diagnosis Date  . Arthritis   . Chest pain 06/2013   Left sided  . Chronic leg pain 06/2013  . Complication of anesthesia   . Dysrhythmia   . Fatigue   . GERD (gastroesophageal reflux disease)   . History of kidney stones   . Hypertension   . Hypothyroidism   . Nonproductive cough 06/2013  . Overweight 01/18/2014  . Peripheral neuropathy   . PONV (postoperative nausea and vomiting)   . Sleep difficulties    Change in sleep patterns  . SOB (shortness of breath) 06/2013  . Tachycardia 06/2013  . Thyroid disease    hypo  . UTI (lower urinary tract infection)   . Vesico-ureteric reflux    Past Surgical History:  Procedure Laterality Date  . ABDOMINAL SURGERY  1994  . CYSTOSCOPY/URETEROSCOPY/HOLMIUM LASER/STENT PLACEMENT Left 09/22/2017   Procedure: CYSTOSCOPY/RETROGRADE/URETEROSCOPY/ BASKET STONE EXTRACTION/STENT PLACEMENT;  Surgeon: Ceasar Mons, MD;  Location: [facility];  Service: Urology;  Laterality: Left;  ONLY NEEDS 60 MIN  . FOOT SURGERY Right 2009  . KIDNEY SURGERY Left 1993  . KNEE SURGERY     Multiple knee surgeries  . OOPHORECTOMY Left 1994  . small fiber neuropathy biopsy  12/2017  .  TONSILLECTOMY    . URETEROSCOPY VIA URETEROSTOMY Left 1994    Current Outpatient Medications  Medication Sig Dispense Refill  . Cholecalciferol (VITAMIN D) 50 MCG (2000 UT) CAPS 2,000 daily OTC in addition to once weekly prescription 30 capsule   . CREAM BASE EX Apply 1 application topically daily. BIEST Transdermal Cream 0.5 mg: Compounded bio-identical Estriol (E3) combined by [place]    . diclofenac (VOLTAREN) 50 MG EC tablet Take 1 tablet by mouth daily.    Marland Kitchen escitalopram (LEXAPRO) 10 MG tablet Take 10 mg by mouth at bedtime.  2  . famotidine (PEPCID) 40 MG tablet Take 40 mg by mouth daily as needed (for heartburn/indigestion.).   2  . Flaxseed, Linseed, (FLAX SEED OIL) 1000 MG CAPS Take 2,000 mg by mouth every evening.    . hydrochlorothiazide (MICROZIDE) 12.5 MG capsule Take 12.5 mg by mouth daily.     Marland Kitchen ibuprofen (ADVIL,MOTRIN) 200 MG tablet Take 400 mg by mouth every 8 (eight) hours as needed (for pain.).    Marland Kitchen levothyroxine (SYNTHROID, LEVOTHROID) 112 MCG tablet Take 112 mcg by mouth daily before breakfast.    . Magnesium 500 MG CAPS Take 1,000 mg by mouth every evening.    . Misc Natural Products (TART CHERRY ADVANCED PO) Take 2,000 mg by mouth daily. 1000 mg per capsule    . NONFORMULARY OR COMPOUNDED ITEM Take 150 mg by mouth at bedtime. Bio identical Progesterone 150 ([facility])    .  Omega-3 Fatty Acids (FISH OIL) 1200 MG CAPS Take 2,400 mg by mouth every evening.    . traZODone (DESYREL) 50 MG tablet Take 1.5 tablets (75 mg total) by mouth at bedtime as needed for sleep. 135 tablet 0  . Vitamin D, Ergocalciferol, (DRISDOL) 1.25 MG (50000 UT) CAPS capsule TAKE 1 CAPSULE BY MOUTH EVERY 7 DAYS. 12 capsule 0   No current facility-administered medications for this visit.     Allergies:   Penicillins and Sulfa antibiotics   Social History: Social History   Socioeconomic History  . Marital status: Married    Spouse name: Not on file  . Number of  children: Not on file  . Years of education: Not on file  . Highest education level: Not on file  Occupational History  . Not on file  Social Needs  . Financial resource strain: Not on file  . Food insecurity    Worry: Not on file    Inability: Not on file  . Transportation needs    Medical: Not on file    Non-medical: Not on file  Tobacco Use  . Smoking status: Never Smoker  . Smokeless tobacco: Never Used  Substance and Sexual Activity  . Alcohol use: No    Frequency: Never    Comment: rarely   . Drug use: No  . Sexual activity: Yes    Birth control/protection: None  Lifestyle  . Physical activity    Days per week: Not on file    Minutes per session: Not on file  . Stress: Not on file  Relationships  . Social Musicianconnections    Talks on phone: Not on file    Gets together: Not on file    Attends religious service: Not on file    Active member of club or organization: Not on file    Attends meetings of clubs or organizations: Not on file    Relationship status: Not on file  . Intimate partner violence    Fear of current or ex partner: Not on file    Emotionally abused: Not on file    Physically abused: Not on file    Forced sexual activity: Not on file  Other Topics Concern  . Not on file  Social History Narrative  . Not on file    Family History: Family History  Problem Relation Age of Onset  . Migraines Other        Fam Hx   . Heart disease Other        Fam Hx  . Thyroid disease Other        Fam Hx - She also has thyroid disease  . Diabetes Other        Fam Hx  . Arthritis Other        Fam Hx  . Gallbladder disease Other        Fam Hx  . Cancer Other        Fam Hx - Pancreatic  . Hypertension Other        Fam Hx  . Heart attack Cousin        Multiple cousins with MI's before 61 years of age  . Heart disease Mother 10640  . Hyperlipidemia Mother 3740  . Hypertension Mother 1340  . Stroke Mother 265  . Heart disease Maternal Grandmother   . Cancer Father  4258       pancreatic  . Diabetes Brother 50  . Heart attack Maternal Uncle   . Heart attack  Paternal Aunt     Review of Systems: All other systems reviewed and are otherwise negative except as noted above.   Physical Exam: VS:  BP 130/89   Pulse 75   Ht 5\' 5"  (1.651 m)   Wt 197 lb 6.4 oz (89.5 kg)   SpO2 97%   BMI 32.85 kg/m  , BMI Body mass index is 32.85 kg/m. Wt Readings from Last 3 Encounters:  08/27/18 197 lb 6.4 oz (89.5 kg)  05/13/18 190 lb (86.2 kg)  04/22/18 190 lb (86.2 kg)    GEN- The patient is well appearing, alert and oriented x 3 today.   HEENT: normocephalic, atraumatic; sclera clear, conjunctiva pink; hearing intact; oropharynx clear; neck supple  Lungs- Clear to ausculation bilaterally, normal work of breathing.  No wheezes, rales, rhonchi Heart- Regular rate and rhythm  GI- soft, non-tender, non-distended, bowel sounds present  Extremities- no clubbing, cyanosis, or edema  MS- no significant deformity or atrophy Skin- warm and dry, no rash or lesion  Psych- euthymic mood, full affect Neuro- strength and sensation are intact   EKG:  EKG is ordered today. The ekg ordered today shows sinus rhythm, rate 75, normal intervals  Recent Labs: 06/17/2018: ALT 21; BUN 16; Creatinine, Ser 0.54; Hemoglobin 14.6; Platelets 227; Potassium 4.2; Sodium 142; TSH 1.700    Other studies Reviewed: Additional studies/ records that were reviewed today include: Dr Lubertha Basqueaylor's office notes  Assessment and Plan:  1.  Surgical clearance She is functionally independent and able to walk 20 minutes daily without chest pain or shortness of breath.   Her risk of major complications is 1.4% I do not think additional cardiac testing is necessary prior to surgery.   2.  Palpitations Stable No change required today   Current medicines are reviewed at length with the patient today.   The patient does not have concerns regarding her medicines.  The following changes were made  today:  none  Labs/ tests ordered today include: none Orders Placed This Encounter  Procedures  . EKG 12-Lead     Disposition:   Follow up with EP prn    Signed, Gypsy BalsamAmber Seiler, NP 08/27/2018 10:08 AM   [facility] [address] [place] [address] [phone] (office) [phone] (fax)

## 2018-08-27 ENCOUNTER — Other Ambulatory Visit: Payer: Self-pay

## 2018-08-27 ENCOUNTER — Encounter: Payer: Self-pay | Admitting: Physician Assistant

## 2018-08-27 ENCOUNTER — Ambulatory Visit (INDEPENDENT_AMBULATORY_CARE_PROVIDER_SITE_OTHER): Payer: BC Managed Care – PPO | Admitting: Nurse Practitioner

## 2018-08-27 ENCOUNTER — Ambulatory Visit (INDEPENDENT_AMBULATORY_CARE_PROVIDER_SITE_OTHER): Payer: BC Managed Care – PPO | Admitting: Physician Assistant

## 2018-08-27 VITALS — BP 130/89 | HR 75 | Ht 65.0 in | Wt 197.4 lb

## 2018-08-27 VITALS — BP 149/81 | HR 73 | Resp 12 | Ht 66.0 in | Wt 197.0 lb

## 2018-08-27 DIAGNOSIS — I10 Essential (primary) hypertension: Secondary | ICD-10-CM

## 2018-08-27 DIAGNOSIS — K449 Diaphragmatic hernia without obstruction or gangrene: Secondary | ICD-10-CM

## 2018-08-27 DIAGNOSIS — R002 Palpitations: Secondary | ICD-10-CM

## 2018-08-27 DIAGNOSIS — E790 Hyperuricemia without signs of inflammatory arthritis and tophaceous disease: Secondary | ICD-10-CM | POA: Diagnosis not present

## 2018-08-27 DIAGNOSIS — M47816 Spondylosis without myelopathy or radiculopathy, lumbar region: Secondary | ICD-10-CM

## 2018-08-27 DIAGNOSIS — M170 Bilateral primary osteoarthritis of knee: Secondary | ICD-10-CM

## 2018-08-27 DIAGNOSIS — E559 Vitamin D deficiency, unspecified: Secondary | ICD-10-CM

## 2018-08-27 DIAGNOSIS — M19071 Primary osteoarthritis, right ankle and foot: Secondary | ICD-10-CM

## 2018-08-27 DIAGNOSIS — F5101 Primary insomnia: Secondary | ICD-10-CM

## 2018-08-27 DIAGNOSIS — M19042 Primary osteoarthritis, left hand: Secondary | ICD-10-CM

## 2018-08-27 DIAGNOSIS — M19072 Primary osteoarthritis, left ankle and foot: Secondary | ICD-10-CM

## 2018-08-27 DIAGNOSIS — Z5181 Encounter for therapeutic drug level monitoring: Secondary | ICD-10-CM | POA: Diagnosis not present

## 2018-08-27 DIAGNOSIS — Z01818 Encounter for other preprocedural examination: Secondary | ICD-10-CM | POA: Diagnosis not present

## 2018-08-27 DIAGNOSIS — M1189 Other specified crystal arthropathies, multiple sites: Secondary | ICD-10-CM | POA: Diagnosis not present

## 2018-08-27 DIAGNOSIS — K760 Fatty (change of) liver, not elsewhere classified: Secondary | ICD-10-CM

## 2018-08-27 DIAGNOSIS — M19041 Primary osteoarthritis, right hand: Secondary | ICD-10-CM | POA: Diagnosis not present

## 2018-08-27 DIAGNOSIS — G629 Polyneuropathy, unspecified: Secondary | ICD-10-CM

## 2018-08-27 DIAGNOSIS — Z87442 Personal history of urinary calculi: Secondary | ICD-10-CM

## 2018-08-27 DIAGNOSIS — M160 Bilateral primary osteoarthritis of hip: Secondary | ICD-10-CM

## 2018-08-27 MED ORDER — COLCHICINE 0.6 MG PO TABS: ORAL_TABLET | ORAL | 0 refills | Status: DC

## 2018-08-27 NOTE — Patient Instructions (Signed)
Standing Labs We placed an order today for your standing lab work.    Please come back and get your standing labs in 1 month   We have open lab daily Monday through Thursday from 8:30-12:30 PM and 1:30-4:30 PM and Friday from 8:30-12:30 PM and 1:30 -4:00 PM at the office of Dr. Shaili Deveshwar.   You may experience shorter wait times on Monday and Friday afternoons. The office is located at [address], [address], [place], [place] [address] No appointment is necessary.   Labs are drawn by [facility].  You may receive a bill from [facility] for your lab work.  If you wish to have your labs drawn at another location, please call the office 24 hours in advance to send orders.  If you have any questions regarding directions or hours of operation,  please call [phone].   Just as a reminder please drink plenty of water prior to coming for your lab work. Thanks!   

## 2018-08-27 NOTE — Patient Instructions (Signed)
Medication Instructions:  none If you need a refill on your cardiac medications before your next appointment, please call your pharmacy.   Lab work: none If you have labs (blood work) drawn today and your tests are completely normal, you will receive your results only by: . MyChart Message (if you have MyChart) OR . A paper copy in the mail If you have any lab test that is abnormal or we need to change your treatment, we will call you to review the results.  Testing/Procedures: none  Follow-Up:  As Needed At [facility], you and your health needs are our priority.  As part of our continuing mission to provide you with exceptional heart care, we have created designated Provider Care Teams.  These Care Teams include your primary Cardiologist (physician) and Advanced Practice Providers (APPs -  Physician Assistants and Nurse Practitioners) who all work together to provide you with the care you need, when you need it. .   Any Other Special Instructions Will Be Listed Below (If Applicable).    

## 2018-08-27 NOTE — Progress Notes (Signed)
Office Visit Note  Patient: Marissa Larson             Date of Birth: 15-Mar-1955           MRN: [id number]             PCP: Thomasene Lotpalski, Deborah, DO Referring: Thomasene Lotpalski, Deborah, DO Visit Date: 08/27/2018 Occupation: @GUAROCC @  Subjective:  Discuss medications   History of Present Illness: Marissa Larson is a 63 y.o. female with history of pseudogout and osteoarthritis. She is taking Diclofenac 50 mg 1 tablet by mouth daily for pain relief.  She is currently has been having recurrent pseudogout flares since discontinuing colchicine in December 2019.  She was taken off of colchicine due to developing paresthesias when taking colchicine 0.6 mg twice daily.  She was switched from colchicine to Relafen which she took for several months of found it to be ineffective and discontinued.  She is currently having pain and swelling in bilateral ankle joints, bilateral knee joints, bilateral wrist joints.  She states that she had a left knee joint cortisone injection performed by Dr. Thurston HoleWainer in June 2020.  She had temporary relief after the cortisone injection.  She is scheduled for a left knee total joint replacement in October 2020.  She was started on diclofenac 50 mg 1 tablet by mouth daily in June which has helped relieve some of her discomfort but she continues to have recurrent flares.  She was evaluated by Dr. Mina MarbleWeingold last week and had a right wrist cortisone injection yesterday.  She states that she is currently wearing a brace which is prescribed for a few days after the injection and then transition to wearing it at night.  She will be following up with Dr. Mina MarbleWeingold in 1 month.  She is interested in restarting on colchicine due to it being the only thing that is been effective for her pseudogout   Activities of Daily Living:  Patient reports morning stiffness for 1 hour.   Patient Reports nocturnal pain.  Difficulty dressing/grooming: Denies Difficulty climbing stairs: Reports Difficulty getting out  of chair: Reports Difficulty using hands for taps, buttons, cutlery, and/or writing: Reports  Review of Systems  Constitutional: Positive for fatigue.  HENT: Negative for mouth sores, mouth dryness and nose dryness.   Eyes: Positive for redness. Negative for pain, visual disturbance and dryness.  Respiratory: Negative for cough, hemoptysis, shortness of breath and difficulty breathing.   Cardiovascular: Positive for palpitations (PVCs). Negative for chest pain, hypertension and swelling in legs/feet.  Gastrointestinal: Negative for blood in stool, constipation and diarrhea.  Endocrine: Negative for increased urination.  Genitourinary: Negative for painful urination.  Musculoskeletal: Positive for arthralgias, joint pain, joint swelling and morning stiffness. Negative for myalgias, muscle weakness, muscle tenderness and myalgias.  Skin: Negative for color change, pallor, rash, hair loss, nodules/bumps, skin tightness, ulcers and sensitivity to sunlight.  Allergic/Immunologic: Negative for susceptible to infections.  Neurological: Negative for dizziness, numbness, headaches and weakness.  Hematological: Negative for swollen glands.  Psychiatric/Behavioral: Positive for sleep disturbance (Trazodone). Negative for depressed mood. The patient is nervous/anxious.     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   Obesity, Class I, BMI 30-34.9 12/03/2016   Kidney stones 09/12/2016   Nausea 09/12/2016   High risk medication use 07/30/2016   NAFLD  (nonalcoholic fatty liver disease) 16/10/960407/25/2018   Primary osteoarthritis of both feet 04/12/2016   History of kidney stones 04/12/2016   Primary osteoarthritis of both hands 04/11/2016   Bilateral primary osteoarthritis of hip 04/11/2016   Primary osteoarthritis of both knees 04/11/2016   Spondylosis of lumbar region without myelopathy or radiculopathy 04/11/2016   Hyperuricemia 04/11/2016   Primary insomnia 04/11/2016   Flank pain 04/02/2016   Sinusitis 04/02/2016   Elevated HDL 11/11/2015   Elevated LFTs 11/11/2015   Adjustment disorder with mixed anxiety and depressed mood 11/11/2015   Dysuria 11/11/2015   Hypothyroidism 10/14/2015   Pseudogout involving multiple joints 10/14/2015   Vitamin D deficiency 10/14/2015   Hormone replacement therapy- per GYN 10/14/2015   HTN (hypertension) 10/14/2015   Fatigue 10/14/2015   h/o Hiatal hernia 10/14/2015   Sleep difficulties 10/14/2015   Generalized OA 10/14/2015   Counseling on health promotion and disease prevention 10/14/2015   Gastric ulcer- due to Mobic 10/09/2015   Age-related nuclear cataract of both eyes 01/25/2015   Posterior vitreous detachment of left eye 01/25/2015   chronic Palpitations 07/19/2013    Past Medical History:  Diagnosis Date   Arthritis    Chest pain 06/2013   Left sided   Chronic leg pain 06/2013   Complication of anesthesia    Dysrhythmia    Fatigue    GERD (gastroesophageal reflux disease)    History of kidney stones    Hypertension    Hypothyroidism    Nonproductive cough 06/2013   Overweight 01/18/2014   Peripheral neuropathy    PONV (postoperative nausea and vomiting)    Sleep difficulties    Change in sleep patterns   SOB (shortness of breath) 06/2013   Tachycardia 06/2013   Thyroid disease    hypo   UTI (lower urinary tract infection)    Vesico-ureteric reflux     Family History  Problem Relation Age of Onset   Migraines Other        Fam Hx     Heart disease Other        Fam Hx   Thyroid disease Other        Fam Hx - She also has thyroid disease   Diabetes Other        Fam Hx   Arthritis Other        Fam Hx   Gallbladder disease Other        Fam Hx   Cancer Other        Fam Hx - Pancreatic   Hypertension Other        Fam Hx   Heart attack Cousin        Multiple cousins with MI's before 61 years of age   Heart disease Mother 2640   Hyperlipidemia Mother 3640   Hypertension Mother 5240   Stroke Mother 6665   Heart disease Maternal Grandmother    Cancer Father [id number]       pancreatic   Diabetes Brother 50   Heart attack Maternal Uncle    Heart attack Paternal Aunt    Past Surgical History:  Procedure Laterality Date   ABDOMINAL SURGERY  1994   CYSTOSCOPY/URETEROSCOPY/HOLMIUM LASER/STENT PLACEMENT Left 09/22/2017   Procedure: CYSTOSCOPY/RETROGRADE/URETEROSCOPY/ BASKET STONE EXTRACTION/STENT PLACEMENT;  Surgeon:  Rene PaciWinter, Christopher Aaron, MD;  Location: [facility];  Service: Urology;  Laterality: Left;  ONLY NEEDS 60 MIN   FOOT SURGERY Right 2009   KIDNEY SURGERY Left 1993   KNEE SURGERY     Multiple knee surgeries   OOPHORECTOMY Left 1994   small fiber neuropathy biopsy  12/2017   TONSILLECTOMY     URETEROSCOPY VIA URETEROSTOMY Left 1994   Social History   Social History Narrative   Not on file   Immunization History  Administered Date(s) Administered   Tdap 01/07/2011     Objective: Vital Signs: BP (!) 149/81 (BP Location: Left Arm, Patient Position: Sitting, Cuff Size: Normal)    Pulse 73    Resp 12    Ht 5\' 6"  (1.676 m)    Wt 197 lb (89.4 kg)    BMI 31.80 kg/m    Physical Exam Vitals signs and nursing note reviewed.  Constitutional:      Appearance: She is well-developed.  HENT:     Head: Normocephalic and atraumatic.  Eyes:     Conjunctiva/sclera: Conjunctivae normal.  Neck:     Musculoskeletal: Normal range of motion.  Cardiovascular:     Rate and Rhythm: Normal rate and  regular rhythm.     Heart sounds: Normal heart sounds.  Pulmonary:     Effort: Pulmonary effort is normal.     Breath sounds: Normal breath sounds.  Abdominal:     General: Bowel sounds are normal.     Palpations: Abdomen is soft.  Lymphadenopathy:     Cervical: No cervical adenopathy.  Skin:    General: Skin is warm and dry.     Capillary Refill: Capillary refill takes less than 2 seconds.  Neurological:     Mental Status: She is alert and oriented to person, place, and time.  Psychiatric:        Behavior: Behavior normal.      Musculoskeletal Exam: C-spine slightly limited range of motion with discomfort she.  She has limited range of motion thoracic and lumbar spine.  Midline spinal tenderness in lumbar region.  No SI joint tenderness.  Shoulder joints have good range of motion.  She has painful range of motion of the left shoulder.  Elbow joints have good range of motion with no tenderness or inflammation.  She has tenderness and swelling of bilateral wrist joints.  MCPs, PIPs, DIPs good range of motion with no synovitis.  She has PIP and DIP synovial thickening consistent with osteoarthritis of bilateral hands.  Hip joints have limited range of motion.  Knee joints have full flexion extension with some discomfort.  Warmth and inflammation of bilateral knees were noted.  No effusion noted.  She has tenderness and inflammation of bilateral ankle joints.  No tenderness of MTPs.  CDAI Exam: CDAI Score: -- Patient Global: --; Provider Global: -- Swollen: --; Tender: -- Joint Exam   No joint exam has been documented for this visit   There is currently no information documented on the homunculus. Go to the Rheumatology activity and complete the homunculus joint exam.  Investigation: No additional findings.  Imaging: No results found.  Recent Labs: Lab Results  Component Value Date   WBC 8.2 06/17/2018   HGB 14.6 06/17/2018   PLT 227 06/17/2018   NA 142 06/17/2018   K 4.2  06/17/2018   CL 102 06/17/2018   CO2 26 06/17/2018   GLUCOSE 99 06/17/2018   BUN 16 06/17/2018   CREATININE 0.54 (L) 06/17/2018  BILITOT 0.4 06/17/2018   ALKPHOS 84 06/17/2018   AST 24 06/17/2018   ALT 21 06/17/2018   PROT 6.5 06/17/2018   ALBUMIN 4.3 06/17/2018   CALCIUM 9.2 06/17/2018   GFRAA 119 06/17/2018   QFTBGOLDPLUS NEGATIVE 02/16/2017    Speciality Comments: No specialty comments available.  Procedures:  No procedures performed Allergies: Penicillins and Sulfa antibiotics   Assessment / Plan:     Visit Diagnoses: Pseudogout involving multiple joints -She continues to have recurrent pseudogout flares.  She was previously taking colchicine 0.6 mg 1 tablet by mouth twice daily but discontinued in December 2019 due to developing paresthesias in both hands.  The paresthesias resolved after discontinuing colchicine.  She was then started on Relafen 500 mg BID, which she took as prescribed for several months but found it to be ineffective and discontinued.  She has been experiencing pain and swelling in bilateral wrist joints, bilateral knee joints, bilateral ankle joints.  She has tenderness and inflammation of both wrist joints and ankle joints on exam today.  She has left knee joint warmth and swelling but no effusion noted.  She was evaluated by Dr. Thurston Hole in June 2020 and had a left knee joint cortisone injection performed.  She was then started on diclofenac 50 mg 1 tablet by mouth daily which she has been taking as prescribed.  She has noticed some improvement in her discomfort but continues to have recurrent flares.  She is interested in restarting on colchicine.  We reviewed the indications, contraindications, potential side effects of colchicine.  She will be started on a low dose and will take colchicine 0.3 mg daily.  She was advised to notify us if she cannot tolerate it or begins noticing the hand paresthesias return.  We discussed that in the future if colchicine is not  effective or that she cannot tolerate it indomethacin is an option.  She declined a prednisone taper today.  She was encouraged to take diclofenac 50 mg 1 tablet by mouth daily only as needed.  We will check lab work in 1 month-future orders for CBC, CMP, and uric acid were placed today.  She will follow-up in the office in 3 months.  A prescription for colchicine 0.6 mg 1/2 tablet by mouth daily will be sent to the pharmacy.  Plan: CBC with Differential/Platelet, COMPLETE METABOLIC PANEL WITH GFR  Hyperuricemia -Future order for uric acid was placed today.  She will return for lab work in 1 month plan: Uric acid  Medication monitoring encounter -Future orders for CBC, CMP, and uric acid replace today CBC with Differential/Platelet, COMPLETE METABOLIC PANEL WITH GFR, Uric acid  Primary osteoarthritis of both hands: She has PIP and DIP synovial thickening consistent with osteoarthritis of bilateral hands.  She has complete fist formation bilaterally.  No tenderness or synovitis was noted.  Joint protection and muscle strengthening were discussed.   Bilateral primary osteoarthritis of hip: She has slightly limited range of motion with some discomfort.  Primary osteoarthritis of both knees: She has full range of motion of bilateral knee joints.  She has warmth and swelling of bilateral knees no effusion was noted.  She had a left knee joint cortisone injection performed in June by Dr. Thurston Hole.  She has been taking diclofenac 50 mg 1 tablet by mouth daily for pain relief.  She is scheduled for a left knee total arthroplasty in October 2020.  Primary osteoarthritis of both feet:  She has chronic pain in bilateral feet.   Spondylosis of  lumbar region without myelopathy or radiculopathy: Chronic pain.  She has limited ROM with discomfort.  Midline spinal tenderness in lumbar region. She does not have any radiculopathy at this time.   Other medical conditions are listed as follows:   Small fiber  neuropathy  NAFLD (nonalcoholic fatty liver disease)  h/o Hiatal hernia  Essential hypertension  Vitamin D deficiency  History of kidney stones - Plan: Uric acid  Primary insomnia  Orders: Orders Placed This Encounter  Procedures   CBC with Differential/Platelet   COMPLETE METABOLIC PANEL WITH GFR   Uric acid   No orders of the defined types were placed in this encounter.   Face-to-face time spent with patient was 30 minutes. Greater than 50% of time was spent in counseling and coordination of care.  Follow-Up Instructions: Return in about 3 months (around 11/27/2018) for Pseudogout , Osteoarthritis.   Ofilia Neas, PA-C  Note - This record has been created using Dragon software.  Chart creation errors have been sought, but may not always  have been located. Such creation errors do not reflect on  the standard of medical care.

## 2018-09-09 DIAGNOSIS — M1712 Unilateral primary osteoarthritis, left knee: Secondary | ICD-10-CM | POA: Diagnosis not present

## 2018-09-15 ENCOUNTER — Other Ambulatory Visit: Payer: Self-pay | Admitting: Family Medicine

## 2018-09-15 DIAGNOSIS — G479 Sleep disorder, unspecified: Secondary | ICD-10-CM

## 2018-09-23 ENCOUNTER — Other Ambulatory Visit: Payer: Self-pay | Admitting: *Deleted

## 2018-09-23 DIAGNOSIS — N3020 Other chronic cystitis without hematuria: Secondary | ICD-10-CM | POA: Diagnosis not present

## 2018-09-23 DIAGNOSIS — M1189 Other specified crystal arthropathies, multiple sites: Secondary | ICD-10-CM | POA: Diagnosis not present

## 2018-09-23 DIAGNOSIS — Z79899 Other long term (current) drug therapy: Secondary | ICD-10-CM

## 2018-09-23 DIAGNOSIS — M25562 Pain in left knee: Secondary | ICD-10-CM | POA: Diagnosis not present

## 2018-09-23 DIAGNOSIS — M1712 Unilateral primary osteoarthritis, left knee: Secondary | ICD-10-CM | POA: Diagnosis not present

## 2018-09-24 ENCOUNTER — Telehealth: Payer: Self-pay | Admitting: Rheumatology

## 2018-09-24 LAB — CBC WITH DIFFERENTIAL/PLATELET
Absolute Monocytes: 533 cells/uL (ref 200–950)
Basophils Absolute: 50 cells/uL (ref 0–200)
Basophils Relative: 0.7 %
Eosinophils Absolute: 101 cells/uL (ref 15–500)
Eosinophils Relative: 1.4 %
HCT: 41.3 % (ref 35.0–45.0)
Hemoglobin: 14.2 g/dL (ref 11.7–15.5)
Lymphs Abs: 2585 cells/uL (ref 850–3900)
MCH: 32.1 pg (ref 27.0–33.0)
MCHC: 34.4 g/dL (ref 32.0–36.0)
MCV: 93.2 fL (ref 80.0–100.0)
MPV: 10.4 fL (ref 7.5–12.5)
Monocytes Relative: 7.4 %
Neutro Abs: 3931 cells/uL (ref 1500–7800)
Neutrophils Relative %: 54.6 %
Platelets: 238 10*3/uL (ref 140–400)
RBC: 4.43 10*6/uL (ref 3.80–5.10)
RDW: 12.6 % (ref 11.0–15.0)
Total Lymphocyte: 35.9 %
WBC: 7.2 10*3/uL (ref 3.8–10.8)

## 2018-09-24 LAB — COMPLETE METABOLIC PANEL WITH GFR
AG Ratio: 2 (calc) (ref 1.0–2.5)
ALT: 27 U/L (ref 6–29)
AST: 27 U/L (ref 10–35)
Albumin: 4.3 g/dL (ref 3.6–5.1)
Alkaline phosphatase (APISO): 64 U/L (ref 37–153)
BUN: 18 mg/dL (ref 7–25)
CO2: 27 mmol/L (ref 20–32)
Calcium: 9.9 mg/dL (ref 8.6–10.4)
Chloride: 104 mmol/L (ref 98–110)
Creat: 0.53 mg/dL (ref 0.50–0.99)
GFR, Est African American: 119 mL/min/{1.73_m2} (ref >=60)
GFR, Est Non African American: 102 mL/min/{1.73_m2} (ref >=60)
Globulin: 2.2 g/dL (calc) (ref 1.9–3.7)
Glucose, Bld: 100 mg/dL — ABNORMAL HIGH (ref 65–99)
Potassium: 4.2 mmol/L (ref 3.5–5.3)
Sodium: 141 mmol/L (ref 135–146)
Total Bilirubin: 0.4 mg/dL (ref 0.2–1.2)
Total Protein: 6.5 g/dL (ref 6.1–8.1)

## 2018-09-24 LAB — URIC ACID: Uric Acid, Serum: 4.9 mg/dL (ref 2.5–7.0)

## 2018-09-24 NOTE — Telephone Encounter (Signed)
Lab results faxed.

## 2018-09-24 NOTE — Telephone Encounter (Signed)
Patient called back to get lab results sent to Dr. Richmond Campbell, gastro doctor. Please call if any problems.

## 2018-09-24 NOTE — Progress Notes (Signed)
WNLs

## 2018-10-01 DIAGNOSIS — M1712 Unilateral primary osteoarthritis, left knee: Secondary | ICD-10-CM | POA: Diagnosis not present

## 2018-10-04 ENCOUNTER — Telehealth: Payer: Self-pay | Admitting: Family Medicine

## 2018-10-04 NOTE — Telephone Encounter (Signed)
Please review and prescribe if appropriate.  T. Nelson, CMA 

## 2018-10-04 NOTE — Telephone Encounter (Signed)
Patient scheduled for Knee replacement surgery next week, having a request for Anxiety medication (was told by surgeon to get from PCP).   --- forwarding to CMA --- am

## 2018-10-04 NOTE — Telephone Encounter (Signed)
Patient is already on an antianxiety medicine.  I recommend she double the Lexapro from 10 to 20mg  for now.  Have her follow-up in 6 to 8 weeks.

## 2018-10-05 NOTE — Telephone Encounter (Signed)
Pt informed.  Pt is scheduled for telemedicine visit 10/06/2018.  Charyl Bigger, CMA

## 2018-10-05 NOTE — Telephone Encounter (Signed)
Yes, she would need an office visit since this would require putting her on a whole new medicine for her anxiety in addition to the Lexapro. (because the higher dose is not work for her)   Unfortunately a change in management like this would warrant an office visit to discuss the risk and benefits of various medicines, what she is been on the past etc. Etc. -I would have to get further history on her symptoms to figure out what would be best for her treatment.  Her previous message is very vague about what symptoms she is having now, for how long etc. Etc.  Thanks.

## 2018-10-05 NOTE — Telephone Encounter (Signed)
Pt informed.  However, pt states that she has doubled the dose in the past and it has not helped with her anxiety.  She again requested a short prescription for something else to help with the anxiety.  Pt states that she is willing to have a visit with you if needed.  Please advise.  Charyl Bigger, CMA

## 2018-10-06 ENCOUNTER — Other Ambulatory Visit: Payer: Self-pay

## 2018-10-06 ENCOUNTER — Encounter: Payer: Self-pay | Admitting: Family Medicine

## 2018-10-06 ENCOUNTER — Ambulatory Visit (INDEPENDENT_AMBULATORY_CARE_PROVIDER_SITE_OTHER): Payer: BC Managed Care – PPO | Admitting: Family Medicine

## 2018-10-06 DIAGNOSIS — G479 Sleep disorder, unspecified: Secondary | ICD-10-CM

## 2018-10-06 DIAGNOSIS — F40248 Other situational type phobia: Secondary | ICD-10-CM

## 2018-10-06 MED ORDER — ESCITALOPRAM OXALATE 20 MG PO TABS: 20.0000 mg | ORAL_TABLET | Freq: Every day | ORAL | 0 refills | Status: DC

## 2018-10-06 MED ORDER — TRAZODONE HCL 50 MG PO TABS: ORAL_TABLET | ORAL | 0 refills | Status: DC
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
## 2018-10-13 DIAGNOSIS — M1712 Unilateral primary osteoarthritis, left knee: Secondary | ICD-10-CM | POA: Diagnosis not present

## 2018-10-13 HISTORY — PX: REPLACEMENT TOTAL KNEE: SUR1224

## 2018-10-22 ENCOUNTER — Other Ambulatory Visit: Payer: Self-pay

## 2018-10-22 ENCOUNTER — Other Ambulatory Visit (HOSPITAL_COMMUNITY): Payer: Self-pay | Admitting: Pulmonary Disease

## 2018-10-22 ENCOUNTER — Ambulatory Visit (HOSPITAL_COMMUNITY)
Admission: RE | Admit: 2018-10-22 | Discharge: 2018-10-22 | Disposition: A | Discharge status: Home / Self Care | Payer: BC Managed Care – PPO | Source: Ambulatory Visit | Attending: Family | Admitting: Family

## 2018-10-22 DIAGNOSIS — M7989 Other specified soft tissue disorders: Secondary | ICD-10-CM | POA: Insufficient documentation

## 2018-10-22 DIAGNOSIS — M79662 Pain in left lower leg: Secondary | ICD-10-CM

## 2018-10-25 ENCOUNTER — Other Ambulatory Visit: Payer: Self-pay | Admitting: Family Medicine

## 2018-10-25 ENCOUNTER — Telehealth: Payer: Self-pay

## 2018-10-25 NOTE — Telephone Encounter (Signed)
Please call pt and advise her that she needs Vitamin D level prior to any further refills

## 2018-10-28 DIAGNOSIS — M25462 Effusion, left knee: Secondary | ICD-10-CM | POA: Diagnosis not present

## 2018-10-28 DIAGNOSIS — M25562 Pain in left knee: Secondary | ICD-10-CM | POA: Diagnosis not present

## 2018-10-28 DIAGNOSIS — M1712 Unilateral primary osteoarthritis, left knee: Secondary | ICD-10-CM | POA: Diagnosis not present

## 2018-10-28 DIAGNOSIS — M7122 Synovial cyst of popliteal space [Baker], left knee: Secondary | ICD-10-CM | POA: Diagnosis not present

## 2018-11-08 ENCOUNTER — Other Ambulatory Visit: Payer: Self-pay | Admitting: Physician Assistant

## 2018-11-08 NOTE — Telephone Encounter (Signed)
Last Visit: 08/27/18 Next Visit: 11/26/18 Labs: 09/23/18 WNL  Okay to refill per Dr. Estanislado Pandy

## 2018-11-09 ENCOUNTER — Telehealth: Payer: Self-pay | Admitting: Family Medicine

## 2018-11-09 NOTE — Telephone Encounter (Signed)
Left patient message to contact office to set up  Lab appt for Vit-D refill  Note   Please call pt and advise her that she needs Vitamin D level prior to any further refills     --glh

## 2018-11-12 NOTE — Progress Notes (Signed)
Office Visit Note  Patient: Marissa Larson             Date of Birth: 22-Jun-1955           MRN: [id number]             PCP: Thomasene Lotpalski, Deborah, DO Referring: Thomasene Lotpalski, Deborah, DO Visit Date: 11/26/2018 Occupation: @GUAROCC @  Subjective:  Left wrist joint pain   History of Present Illness: Marissa Larson is a 63 y.o. female with history of pseudogout and osteoarthritis.  She is taking colchicine 0.6 mg half tablet by mouth daily.  She has noted significant improvement since restarting on colchicine.  She has been tolerating colchicine without any side effects.  She has not had any symptoms of paresthesias since restarting.  She states that the colchicine has helped with her joint pain and joint swelling.  She states that she had a right wrist cortisone injection performed by Dr. Mina MarbleWeingold in August 2020 which also helped with her discomfort.  She is planning on having the left wrist injected in the future.  She states that she had her left knee replaced by Dr. Thurston HoleWainer in October.  She states that after through surgery she developed an infection in the Baker's cyst which had to be drained.  She was on antibiotics but the infection has resolved.  She is going to physical therapy twice weekly.    Activities of Daily Living:  Patient reports morning stiffness for 1 hour.   Patient Denies nocturnal pain.  Difficulty dressing/grooming: Denies Difficulty climbing stairs: Reports Difficulty getting out of chair: Reports Difficulty using hands for taps, buttons, cutlery, and/or writing: Reports  Review of Systems  Constitutional: Negative for fatigue.  HENT: Negative for mouth sores, mouth dryness and nose dryness.   Eyes: Negative for itching and dryness.  Respiratory: Negative for shortness of breath, wheezing and difficulty breathing.   Cardiovascular: Negative for chest pain, palpitations and swelling in legs/feet.  Gastrointestinal: Negative for blood in stool, constipation and diarrhea.   Endocrine: Negative for increased urination.  Genitourinary: Negative for difficulty urinating and painful urination.  Musculoskeletal: Positive for arthralgias, joint pain and morning stiffness. Negative for joint swelling.  Skin: Negative for rash and hair loss.  Allergic/Immunologic: Negative for susceptible to infections.  Neurological: Negative for dizziness, numbness, headaches, memory loss and weakness.  Hematological: Negative for bruising/bleeding tendency.  Psychiatric/Behavioral: Negative for confusion.    PMFS History:  Patient Active Problem List   Diagnosis Date Noted  . Elevated LDL cholesterol level 07/19/2018  . Prediabetes 07/19/2018  . Paresthesia 12/09/2017  . Chronic insomnia 11/24/2017  . Insomnia secondary to chronic pain 11/24/2017  . Complaint related to dreams 11/24/2017  . 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 Neuropathy 08/18/2017  . 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 Obesity, Class I, BMI 30-34.9 12/03/2016  . Kidney stones 09/12/2016  . Nausea 09/12/2016  . High risk medication use 07/30/2016  . NAFLD (nonalcoholic fatty liver disease) 40/98/119107/25/2018  . Primary osteoarthritis of both feet 04/12/2016  . History of kidney stones 04/12/2016  . Primary osteoarthritis of both hands 04/11/2016  . Bilateral primary osteoarthritis of hip 04/11/2016  . Primary osteoarthritis of both knees 04/11/2016  . Spondylosis of lumbar region without myelopathy or radiculopathy 04/11/2016  . Hyperuricemia 04/11/2016  . Primary insomnia 04/11/2016  . Flank pain 04/02/2016  . Sinusitis 04/02/2016  . Elevated HDL 11/11/2015  .  Elevated LFTs 11/11/2015  . Adjustment disorder with mixed anxiety and depressed mood 11/11/2015  . Dysuria 11/11/2015  . Hypothyroidism 10/14/2015  . Pseudogout involving multiple joints 10/14/2015  . Vitamin D deficiency 10/14/2015  .  Hormone replacement therapy- per GYN 10/14/2015  . HTN (hypertension) 10/14/2015  . Fatigue 10/14/2015  . h/o Hiatal hernia 10/14/2015  . Sleep difficulties 10/14/2015  . Generalized OA 10/14/2015  . Counseling on health promotion and disease prevention 10/14/2015  . 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 Age-related nuclear cataract of both eyes 01/25/2015  . Posterior vitreous detachment of left eye 01/25/2015  . chronic Palpitations 07/19/2013    Past Medical History:  Diagnosis Date  . Arthritis   . Chest pain 06/2013   Left sided  . Chronic leg pain 06/2013  . Complication of anesthesia   . Dysrhythmia   . Fatigue   . GERD (gastroesophageal reflux disease)   . History of kidney stones   . Hypertension   . Hypothyroidism   . Nonproductive cough 06/2013  . Overweight 01/18/2014  . Peripheral neuropathy   . PONV (postoperative nausea and vomiting)   . Sleep difficulties    Change in sleep patterns  . SOB (shortness of breath) 06/2013  . Tachycardia 06/2013  . Thyroid disease    hypo  . UTI (lower urinary tract infection)   . Vesico-ureteric reflux     Family History  Problem Relation Age of Onset  . Migraines Other        Fam Hx   . Heart disease Other        Fam Hx  . Thyroid disease Other        Fam Hx - She also has thyroid disease  . Diabetes Other        Fam Hx  . Arthritis Other        Fam Hx  . Gallbladder disease Other        Fam Hx  . Cancer Other        Fam Hx - Pancreatic  . Hypertension Other        Fam Hx  . Heart attack Cousin        Multiple cousins with MI's before 45 years of age  . Heart disease Mother 68  . Hyperlipidemia Mother 50  . Hypertension Mother 28  . Stroke Mother 50  . Heart disease Maternal Grandmother   . Cancer Father 77       pancreatic  . Diabetes Brother 50  . Heart attack Maternal Uncle   . Heart attack Paternal Aunt    Past Surgical History:  Procedure Laterality Date  . ABDOMINAL SURGERY  1994  .  CYSTOSCOPY/URETEROSCOPY/HOLMIUM LASER/STENT PLACEMENT Left 09/22/2017   Procedure: CYSTOSCOPY/RETROGRADE/URETEROSCOPY/ BASKET STONE EXTRACTION/STENT PLACEMENT;  Surgeon: Rene Paci, MD;  Location: [facility];  Service: Urology;  Laterality: Left;  ONLY NEEDS 60 MIN  . FOOT SURGERY Right 2009  . KIDNEY SURGERY Left 1993  . KNEE SURGERY     Multiple knee surgeries  . OOPHORECTOMY Left 1994  . 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 Thurston Hole  . small fiber neuropathy biopsy  12/2017  . TONSILLECTOMY    . URETEROSCOPY VIA URETEROSTOMY Left 1994   Social History   Social History Narrative  . Not on file   Immunization History  Administered Date(s) Administered  . Tdap 01/07/2011     Objective: Vital Signs: BP (!) 141/80 (BP Location: Left Arm, Patient Position: Sitting,  Cuff Size: Normal)   Pulse 82   Resp 13   Ht 5\' 6"  (1.676 m)   Wt 201 lb (91.2 kg)   BMI 32.44 kg/m    Physical Exam Vitals signs and nursing note reviewed.  Constitutional:      Appearance: She is well-developed.  HENT:     Head: Normocephalic and atraumatic.  Eyes:     Conjunctiva/sclera: Conjunctivae normal.  Neck:     Musculoskeletal: Normal range of motion.  Cardiovascular:     Rate and Rhythm: Normal rate and regular rhythm.     Heart sounds: Normal heart sounds.  Pulmonary:     Effort: Pulmonary effort is normal.     Breath sounds: Normal breath sounds.  Abdominal:     General: Bowel sounds are normal.     Palpations: Abdomen is soft.  Lymphadenopathy:     Cervical: No cervical adenopathy.  Skin:    General: Skin is warm and dry.     Capillary Refill: Capillary refill takes less than 2 seconds.  Neurological:     Mental Status: She is alert and oriented to person, place, and time.  Psychiatric:        Behavior: Behavior normal.      Musculoskeletal Exam: C-spine, thoracic spine, lumbar spine good range of motion.  No midline spinal tenderness.  Shoulder joints, elbow  joints, wrist joints, MCPs, PIPs, DIPs good range of motion with no synovitis.  She has PIP and DIP synovial thickening consistent with osteoarthritis of both hands.  She has tenderness of the left wrist joint.  Hip joints have slightly limited range of motion due to discomfort in her lower back.  Left knee replacement has about 110 degrees of flexion and nearly full extension.  Right knee joint has good range of motion with no warmth or effusion.  She has tenderness of the left ankle joint on exam today.  CDAI Exam: CDAI Score: - Patient Global: -; Provider Global: - Swollen: 1 ; Tender: 2  Joint Exam      Right  Left  Wrist      Tender  Knee     Swollen   Ankle      Tender     Investigation: No additional findings.  Imaging: No results found.  Recent Labs: Lab Results  Component Value Date   WBC 7.2 09/23/2018   HGB 14.2 09/23/2018   PLT 238 09/23/2018   NA 141 09/23/2018   K 4.2 09/23/2018   CL 104 09/23/2018   CO2 27 09/23/2018   GLUCOSE 100 (H) 09/23/2018   BUN 18 09/23/2018   CREATININE 0.53 09/23/2018   BILITOT 0.4 09/23/2018   ALKPHOS 84 06/17/2018   AST 27 09/23/2018   ALT 27 09/23/2018   PROT 6.5 09/23/2018   ALBUMIN 4.3 06/17/2018   CALCIUM 9.9 09/23/2018   GFRAA 119 09/23/2018   QFTBGOLDPLUS NEGATIVE 02/16/2017    Speciality Comments: No specialty comments available.  Procedures:  No procedures performed Allergies: Penicillins and Sulfa antibiotics   Assessment / Plan:     Visit Diagnoses: Pseudogout involving multiple joints -She has noticed significant improvement since restarting on colchicine 0.6 mg half tablet by mouth daily.  She has been tolerating colchicine without any side effects.  She has not been experiencing any paresthesias.  She has noticed less joint stiffness, inflammation, and discomfort since restarting on colchicine.    She had a right wrist joint cortisone injection by Dr. Burney Gauze in August 2020 which provided significant relief.  She is planning on having the left wrist injected soon.  She would like to increase the dose of colchicine further.  She will try taking colchicine 0.6 mg 1 tablet alternating with half tablet every other day.  She is advised to notify us when she needs a refill.  If she starts developing paresthesias she will reduce the dose to half tablet by mouth daily.  She is advised to notify us if she continues to have recurrent flares.  She will follow-up in the office in 6 months.   High risk medication use: She is taking colchicine 0.6 mg half tablet by mouth daily.  CBC and CMP were within normal limits on 09/23/2018.  Hyperuricemia: Uric acid was 4.9 on 09/23/2018.  Primary osteoarthritis of both hands: She has PIP and DIP synovial thickening consistent with osteoarthritis of both hands.  She has no tenderness or synovitis on exam today.  She has complete fist formation bilaterally.  Bilateral primary osteoarthritis of hip: She has limited range of motion of bilateral hip joints on exam.  Primary osteoarthritis of right knee: She has good range of motion with no discomfort.  No warmth or effusion was noted at this time.  She is planning to have the right knee replaced in 2021.  S/P total knee replacement, left: Dr. Thurston Hole October 2020: According to the patient after surgery she developed an infection in the Baker's cyst which had to be drained.  She was on several rounds of antibiotics which she has completed.  She is going to physical therapy twice a week and has been progressing well.  She has about 110 degrees flexion and almost full extension.  Her discomfort has been improving.  Primary osteoarthritis of both feet: She has no discomfort in her toes at this time.  She has tenderness of the left ankle joint on exam.  She states that discomfort in her left ankle started after starting physical therapy for the left knee replacement.  We discussed the importance of wearing proper fitting shoes.  Spondylosis  of lumbar region without myelopathy or radiculopathy: She has chronic pain in her lower back.  Other medical conditions are listed as follows:  NAFLD (nonalcoholic fatty liver disease)  Small fiber neuropathy  History of kidney stones  Essential hypertension  h/o Hiatal hernia  Vitamin D deficiency  Primary insomnia  Orders: No orders of the defined types were placed in this encounter.  No orders of the defined types were placed in this encounter.    Follow-Up Instructions: Return in about 6 months (around 05/26/2019) for Pseudogout, Osteoarthritis.   Gearldine Bienenstock, PA-C  Note - This record has been created using Dragon software.  Chart creation errors have been sought, but may not always  have been located. Such creation errors do not reflect on  the standard of medical care.

## 2018-11-26 ENCOUNTER — Other Ambulatory Visit: Payer: Self-pay

## 2018-11-26 ENCOUNTER — Ambulatory Visit (INDEPENDENT_AMBULATORY_CARE_PROVIDER_SITE_OTHER): Payer: BC Managed Care – PPO | Admitting: Physician Assistant

## 2018-11-26 ENCOUNTER — Encounter: Payer: Self-pay | Admitting: Physician Assistant

## 2018-11-26 VITALS — BP 141/80 | HR 82 | Resp 13 | Ht 66.0 in | Wt 201.0 lb

## 2018-11-26 DIAGNOSIS — M47816 Spondylosis without myelopathy or radiculopathy, lumbar region: Secondary | ICD-10-CM

## 2018-11-26 DIAGNOSIS — E790 Hyperuricemia without signs of inflammatory arthritis and tophaceous disease: Secondary | ICD-10-CM | POA: Diagnosis not present

## 2018-11-26 DIAGNOSIS — I10 Essential (primary) hypertension: Secondary | ICD-10-CM

## 2018-11-26 DIAGNOSIS — M19042 Primary osteoarthritis, left hand: Secondary | ICD-10-CM

## 2018-11-26 DIAGNOSIS — K760 Fatty (change of) liver, not elsewhere classified: Secondary | ICD-10-CM

## 2018-11-26 DIAGNOSIS — K449 Diaphragmatic hernia without obstruction or gangrene: Secondary | ICD-10-CM

## 2018-11-26 DIAGNOSIS — M1189 Other specified crystal arthropathies, multiple sites: Secondary | ICD-10-CM | POA: Diagnosis not present

## 2018-11-26 DIAGNOSIS — M19071 Primary osteoarthritis, right ankle and foot: Secondary | ICD-10-CM

## 2018-11-26 DIAGNOSIS — M160 Bilateral primary osteoarthritis of hip: Secondary | ICD-10-CM

## 2018-11-26 DIAGNOSIS — M19041 Primary osteoarthritis, right hand: Secondary | ICD-10-CM | POA: Diagnosis not present

## 2018-11-26 DIAGNOSIS — Z87442 Personal history of urinary calculi: Secondary | ICD-10-CM

## 2018-11-26 DIAGNOSIS — Z79899 Other long term (current) drug therapy: Secondary | ICD-10-CM

## 2018-11-26 DIAGNOSIS — M19072 Primary osteoarthritis, left ankle and foot: Secondary | ICD-10-CM

## 2018-11-26 DIAGNOSIS — Z96652 Presence of left artificial knee joint: Secondary | ICD-10-CM

## 2018-11-26 DIAGNOSIS — G629 Polyneuropathy, unspecified: Secondary | ICD-10-CM

## 2018-11-26 DIAGNOSIS — F5101 Primary insomnia: Secondary | ICD-10-CM

## 2018-11-26 DIAGNOSIS — E559 Vitamin D deficiency, unspecified: Secondary | ICD-10-CM

## 2018-11-26 DIAGNOSIS — M1711 Unilateral primary osteoarthritis, right knee: Secondary | ICD-10-CM

## 2018-12-07 DIAGNOSIS — M25572 Pain in left ankle and joints of left foot: Secondary | ICD-10-CM | POA: Diagnosis not present

## 2018-12-07 DIAGNOSIS — M25532 Pain in left wrist: Secondary | ICD-10-CM | POA: Diagnosis not present

## 2018-12-16 ENCOUNTER — Telehealth: Payer: Self-pay | Admitting: Family Medicine

## 2018-12-16 DIAGNOSIS — E559 Vitamin D deficiency, unspecified: Secondary | ICD-10-CM

## 2018-12-16 DIAGNOSIS — M7122 Synovial cyst of popliteal space [Baker], left knee: Secondary | ICD-10-CM | POA: Diagnosis not present

## 2018-12-16 NOTE — Telephone Encounter (Signed)
Per Dr. Raliegh Scarlet.... Patient had recent labs in June that were pretty normal then a CMP and CBC in September. Patient does not need labs at this time, other than the Vitamin D.  At patients last visit in September she was advised to follow up in 30 days and has not. Patient needs apt for further refills on chronic issues.   Can you please call this patient and advise patient to schedule doxy or telehealth visit.   AS, CMA

## 2018-12-16 NOTE — Telephone Encounter (Signed)
Patient is coming in on 12/21/18 to have vitamin-D labs and wants to know if she can get all her labs done, CBC, CMP, A1C, Lipid and anything else you think she needs.  Last draw date was 06/17/18.  Are you OK with this?

## 2018-12-16 NOTE — Telephone Encounter (Signed)
I will check her chart and let you know.  Thnx

## 2018-12-21 ENCOUNTER — Other Ambulatory Visit: Payer: BC Managed Care – PPO

## 2018-12-23 ENCOUNTER — Other Ambulatory Visit: Payer: Self-pay

## 2018-12-23 DIAGNOSIS — E7889 Other lipoprotein metabolism disorders: Secondary | ICD-10-CM

## 2018-12-27 ENCOUNTER — Other Ambulatory Visit: Payer: BC Managed Care – PPO

## 2018-12-29 ENCOUNTER — Other Ambulatory Visit: Payer: BC Managed Care – PPO

## 2019-01-04 ENCOUNTER — Other Ambulatory Visit: Payer: BC Managed Care – PPO

## 2019-01-04 ENCOUNTER — Other Ambulatory Visit: Payer: Self-pay | Admitting: Family Medicine

## 2019-01-04 ENCOUNTER — Other Ambulatory Visit: Payer: Self-pay

## 2019-01-04 DIAGNOSIS — E7889 Other lipoprotein metabolism disorders: Secondary | ICD-10-CM | POA: Diagnosis not present

## 2019-01-04 DIAGNOSIS — E559 Vitamin D deficiency, unspecified: Secondary | ICD-10-CM | POA: Diagnosis not present

## 2019-01-04 DIAGNOSIS — G479 Sleep disorder, unspecified: Secondary | ICD-10-CM

## 2019-01-05 LAB — LIPID PANEL
Chol/HDL Ratio: 2.5 ratio (ref 0.0–4.4)
Cholesterol, Total: 227 mg/dL — ABNORMAL HIGH (ref 100–199)
HDL: 92 mg/dL (ref >39)
LDL Chol Calc (NIH): 112 mg/dL — ABNORMAL HIGH (ref 0–99)
Triglycerides: 136 mg/dL (ref 0–149)
VLDL Cholesterol Cal: 23 mg/dL (ref 5–40)

## 2019-01-05 LAB — VITAMIN D 25 HYDROXY (VIT D DEFICIENCY, FRACTURES): Vit D, 25-Hydroxy: 28.7 ng/mL — ABNORMAL LOW (ref 30.0–100.0)

## 2019-01-06 ENCOUNTER — Other Ambulatory Visit: Payer: Self-pay | Admitting: Rheumatology

## 2019-01-06 NOTE — Telephone Encounter (Signed)
Last Visit: 11/26/2018  Next Visit: 05/27/2018 Labs: 09/23/2018 WNL   Okay to refill per Dr. Deveshwar 

## 2019-01-19 ENCOUNTER — Other Ambulatory Visit: Payer: Self-pay | Admitting: Family Medicine

## 2019-01-19 DIAGNOSIS — G479 Sleep disorder, unspecified: Secondary | ICD-10-CM

## 2019-01-27 ENCOUNTER — Other Ambulatory Visit: Payer: Self-pay | Admitting: Family Medicine

## 2019-02-04 ENCOUNTER — Other Ambulatory Visit: Payer: Self-pay | Admitting: Rheumatology

## 2019-02-04 NOTE — Telephone Encounter (Signed)
Last Visit: 11/26/2018  Next Visit: 05/27/2018 Labs: 09/23/2018 WNL   Okay to refill per Dr. Corliss Skains

## 2019-02-09 ENCOUNTER — Telehealth: Payer: Self-pay | Admitting: Rheumatology

## 2019-02-09 NOTE — Telephone Encounter (Signed)
Patient called stating her medication insurance has changed to [facility] and they are requiring her to use [employer].  Patient states they also need a new prescription for Mitigare, as they will not cover Colchicine.  Patient states Ladona Ridgel changed her dosage from 1/2 pill every day to 1 pill every other day.  Please advise.

## 2019-02-10 MED ORDER — COLCHICINE 0.6 MG PO CAPS: 1.0000 | ORAL_CAPSULE | ORAL | 0 refills | Status: DC

## 2019-02-10 NOTE — Telephone Encounter (Signed)
If colchicine is controlling her symptoms and she is tolerating that dose she can continue taking colchicine 0.6 mg 1 tablet every other day.

## 2019-02-10 NOTE — Telephone Encounter (Signed)
Last Visit: 11/26/2018 Next Visit: 05/27/2019  Per office note from 11/26/2018 She will try taking colchicine 0.6 mg 1 tablet alternating with half tablet every other day.  Per patient, she has been taking 1 tablet every other day.  Please clarify correct dosage. Thanks!

## 2019-02-10 NOTE — Addendum Note (Signed)
Addended by: Ellen Henri on: 02/10/2019 03:26 PM   Modules accepted: Orders

## 2019-02-10 NOTE — Telephone Encounter (Signed)
Attempted to contact patient and left message on machine for patient to call the office.  

## 2019-02-10 NOTE — Telephone Encounter (Signed)
Patient returned the call and states she is doing well on current colchicine dose. Patient states she was taking 0.5 tab alternating with 1 tab every other day not but not consistently. Patient is mainly taking colchicine every other day. advised patient we would send in prescription for mitigare 0.6mg  capsules, take 1 every other day. Patient verbalized understanding. Patient will call the office if any new symptoms develop or worsen.   When searching for [employer], [facility] pharmacy comes up. Prescription sent.

## 2019-02-22 ENCOUNTER — Ambulatory Visit: Payer: BLUE CROSS/BLUE SHIELD | Admitting: Podiatry

## 2019-02-23 NOTE — Progress Notes (Addendum)
Office Visit Note  Patient: Marissa Larson             Date of Birth: [birth year]           MRN: [id number]             PCP: Thomasene Lot, DO Referring: Thomasene Lot, DO Visit Date: 03/02/2019 Occupation: @GUAROCC @  Subjective:  Pain in multiple joints   History of Present Illness: Declan Adamson is a 64 y.o. female with history of pseudogout and osteoarthritis. She is taking colchicine 0.6 mg 1 capsule by mouth daily.  Patient reports that she has been having more frequent and severe flares.  She states that she increase the dose of colchicine to once daily about 1 week ago but has not noticed much improvement.  She is been experiencing increased pain in both shoulder joints, both wrist joints, both ankle joints, and both great toes.  She experiences intermittent swelling in both wrist joints and in both feet.  She had a left wrist cortisone injection performed by Dr. 77 on 12/07/2018, which provided temporary relief.   Activities of Daily Living:  Patient reports morning stiffness for several hours.   Patient Reports nocturnal pain.  Difficulty dressing/grooming: Reports Difficulty climbing stairs: Reports Difficulty getting out of chair: Reports Difficulty using hands for taps, buttons, cutlery, and/or writing: Reports  Review of Systems  Constitutional: Positive for fatigue.  HENT: Negative for mouth sores, mouth dryness and nose dryness.   Eyes: Negative for pain, itching, visual disturbance and dryness.  Respiratory: Negative for cough, hemoptysis, shortness of breath and difficulty breathing.   Cardiovascular: Negative for chest pain, palpitations, hypertension and swelling in legs/feet.  Gastrointestinal: Negative for blood in stool, constipation and diarrhea.  Endocrine: Negative for increased urination.  Genitourinary: Negative for difficulty urinating and painful urination.  Musculoskeletal: Positive for arthralgias, joint pain, joint swelling and morning  stiffness. Negative for myalgias, muscle weakness, muscle tenderness and myalgias.  Skin: Negative for color change, pallor, rash, hair loss, nodules/bumps, skin tightness, ulcers and sensitivity to sunlight.  Allergic/Immunologic: Negative for susceptible to infections.  Neurological: Negative for dizziness, numbness, headaches and memory loss.  Hematological: Negative for bruising/bleeding tendency and swollen glands.  Psychiatric/Behavioral: Negative for depressed mood, confusion and sleep disturbance. The patient is not nervous/anxious.     PMFS History:  Patient Active Problem List   Diagnosis Date Noted  . Elevated LDL cholesterol level 07/19/2018  . Prediabetes 07/19/2018  . Paresthesia 12/09/2017  . Chronic insomnia 11/24/2017  . Insomnia secondary to chronic pain 11/24/2017  . Complaint related to dreams 11/24/2017  . 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 Neuropathy 08/18/2017  . 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 Obesity, Class I, BMI 30-34.9 12/03/2016  . Kidney stones 09/12/2016  . Nausea 09/12/2016  . High risk medication use 07/30/2016  . NAFLD (nonalcoholic fatty liver disease) 08/01/2016  . Primary osteoarthritis of both feet 04/12/2016  . History of kidney stones 04/12/2016  . Primary osteoarthritis of both hands 04/11/2016  . Bilateral primary osteoarthritis of hip 04/11/2016  . Primary osteoarthritis of both knees 04/11/2016  . Spondylosis of lumbar region without myelopathy or radiculopathy 04/11/2016  . Hyperuricemia 04/11/2016  . Primary insomnia 04/11/2016  . Flank pain 04/02/2016  . Sinusitis 04/02/2016  . Elevated HDL 11/11/2015  . Elevated LFTs 11/11/2015  . Adjustment disorder with mixed anxiety and depressed mood 11/11/2015  .  Dysuria 11/11/2015  . Hypothyroidism 10/14/2015  . Pseudogout involving multiple joints 10/14/2015  . Vitamin D  deficiency 10/14/2015  . Hormone replacement therapy- per GYN 10/14/2015  . HTN (hypertension) 10/14/2015  . Fatigue 10/14/2015  . h/o Hiatal hernia 10/14/2015  . Sleep difficulties 10/14/2015  . Generalized OA 10/14/2015  . Counseling on health promotion and disease prevention 10/14/2015  . 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 Age-related nuclear cataract of both eyes 01/25/2015  . Posterior vitreous detachment of left eye 01/25/2015  . chronic Palpitations 07/19/2013    Past Medical History:  Diagnosis Date  . Arthritis   . Chest pain 06/2013   Left sided  . Chronic leg pain 06/2013  . Complication of anesthesia   . Dysrhythmia   . Fatigue   . GERD (gastroesophageal reflux disease)   . History of kidney stones   . Hypertension   . Hypothyroidism   . Nonproductive cough 06/2013  . Overweight 01/18/2014  . Peripheral neuropathy   . PONV (postoperative nausea and vomiting)   . Sleep difficulties    Change in sleep patterns  . SOB (shortness of breath) 06/2013  . Tachycardia 06/2013  . Thyroid disease    hypo  . UTI (lower urinary tract infection)   . Vesico-ureteric reflux     Family History  Problem Relation Age of Onset  . Migraines Other        Fam Hx   . Heart disease Other        Fam Hx  . Thyroid disease Other        Fam Hx - She also has thyroid disease  . Diabetes Other        Fam Hx  . Arthritis Other        Fam Hx  . Gallbladder disease Other        Fam Hx  . Cancer Other        Fam Hx - Pancreatic  . Hypertension Other        Fam Hx  . Heart attack Cousin        Multiple cousins with MI's before 69 years of age  . Heart disease Mother 61  . Hyperlipidemia Mother 85  . Hypertension Mother 99  . Stroke Mother 33  . Heart disease Maternal Grandmother   . Cancer Father 41       pancreatic  . Diabetes Brother 50  . Heart attack Maternal Uncle   . Heart attack Paternal Aunt    Past Surgical History:  Procedure Laterality Date  . ABDOMINAL  SURGERY  1994  . CYSTOSCOPY/URETEROSCOPY/HOLMIUM LASER/STENT PLACEMENT Left 09/22/2017   Procedure: CYSTOSCOPY/RETROGRADE/URETEROSCOPY/ BASKET STONE EXTRACTION/STENT PLACEMENT;  Surgeon: Rene Paci, MD;  Location: [facility];  Service: Urology;  Laterality: Left;  ONLY NEEDS 60 MIN  . FOOT SURGERY Right 2009  . KIDNEY SURGERY Left 1993  . KNEE SURGERY     Multiple knee surgeries  . OOPHORECTOMY Left 1994  . 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 Thurston Hole  . small fiber neuropathy biopsy  12/2017  . TONSILLECTOMY    . URETEROSCOPY VIA URETEROSTOMY Left 1994   Social History   Social History Narrative  . Not on file   Immunization History  Administered Date(s) Administered  . Tdap 01/07/2011     Objective: Vital Signs: BP (!) 157/84 (BP Location: Left Arm, Patient Position: Sitting, Cuff Size: Normal)   Pulse 74   Resp 13   Ht 5\' 6"  (  1.676 m)   Wt 201 lb (91.2 kg)   BMI 32.44 kg/m    Physical Exam Vitals and nursing note reviewed.  Constitutional:      Appearance: She is well-developed.  HENT:     Head: Normocephalic and atraumatic.  Eyes:     Conjunctiva/sclera: Conjunctivae normal.  Cardiovascular:     Rate and Rhythm: Normal rate and regular rhythm.     Heart sounds: Normal heart sounds.  Pulmonary:     Effort: Pulmonary effort is normal.     Breath sounds: Normal breath sounds.  Abdominal:     General: Bowel sounds are normal.     Palpations: Abdomen is soft.  Musculoskeletal:     Cervical back: Normal range of motion.  Lymphadenopathy:     Cervical: No cervical adenopathy.  Skin:    General: Skin is warm and dry.     Capillary Refill: Capillary refill takes less than 2 seconds.  Neurological:     Mental Status: She is alert and oriented to person, place, and time.  Psychiatric:        Behavior: Behavior normal.      Musculoskeletal Exam: C-spine limited range of motion.  Limited range of motion with lumbar range of motion.  Midline  spinal tenderness in the lumbar region.  Shoulder joints have painful abduction and internal rotation.  Elbow joints have good range of motion with no tenderness or synovitis.  Wrist joints have limited range of motion.  She has tenderness at both wrist joints.  Synovial thickening of the right second and third MCP joints.  She has PIP and DIP synovial thickening consistent with osteoarthritis of both hands.  Hip joints have good range of motion.  Left knee replacement has limited extension.  Right knee has good range of motion with no warmth or effusion.  Ankle joints have good range of motion with no tenderness or inflammation at this time.  Tenderness of the right first MTP joint.  CDAI Exam: CDAI Score: -- Patient Global: --; Provider Global: -- Swollen: --; Tender: -- Joint Exam 03/02/2019   No joint exam has been documented for this visit   There is currently no information documented on the homunculus. Go to the Rheumatology activity and complete the homunculus joint exam.  Investigation: No additional findings.  Imaging: XR Foot 2 Views Left  Result Date: 03/02/2019 PIP and DIP narrowing was noted.  No intertarsal or tibiotalar joint space narrowing was noted. Impression: These findings are consistent with osteoarthritis of the foot.  XR Foot 2 Views Right  Result Date: 03/02/2019 The screws were noted in the first metatarsal tarsal joint.  First MTP, PIP and DIP narrowing was noted.  No MTP narrowing was noted.  Post surgical changes were noted.  No tibiotalar or subtalar joint space narrowing was noted.  A screw was noted in the calcaneum. Impression: These findings are consistent with osteoarthritis and postsurgical changes.  XR Hand 2 View Left  Result Date: 03/02/2019 PIP and DIP narrowing was noted.  No MCP joint narrowing was noted.  No intercarpal radiocarpal joint space narrowing was noted.  Calcification was noted in the wrist joint consistent with chondrocalcinosis.  Impression: These findings are consistent with mild osteoarthritis and chondrocalcinosis.  XR Hand 2 View Right  Result Date: 03/02/2019 PIP and DIP narrowing was noted.  No MCP joint narrowing was noted.  No intercarpal radiocarpal joint space narrowing was noted.  Calcification was noted in the wrist joint consistent with chondrocalcinosis. Impression: These  findings are consistent with mild osteoarthritis and chondrocalcinosis.   Recent Labs: Lab Results  Component Value Date   WBC 7.2 09/23/2018   HGB 14.2 09/23/2018   PLT 238 09/23/2018   NA 141 09/23/2018   K 4.2 09/23/2018   CL 104 09/23/2018   CO2 27 09/23/2018   GLUCOSE 100 (H) 09/23/2018   BUN 18 09/23/2018   CREATININE 0.53 09/23/2018   BILITOT 0.4 09/23/2018   ALKPHOS 84 06/17/2018   AST 27 09/23/2018   ALT 27 09/23/2018   PROT 6.5 09/23/2018   ALBUMIN 4.3 06/17/2018   CALCIUM 9.9 09/23/2018   GFRAA 119 09/23/2018   QFTBGOLDPLUS NEGATIVE 02/16/2017    Speciality Comments: No specialty comments available.  Procedures:  No procedures performed Allergies: Penicillins and Sulfa antibiotics   Assessment / Plan:     Visit Diagnoses: Inflammatory arthritis -She has tenderness and synovitis on the lateral aspect of both wrist joints.  She has synovial thickening of the right second and third MCP joints.  She has been experiencing increased pain in both hands, both wrist joints, and both feet.  X-rays of both hands and both feet were obtained today. She has chondrocalcinosis present in both wrists.  She had a left wrist joint cortisone injection performed on 12/07/2018 by Dr. Burney Gauze which provided temporary relief.  She is also had intermittent discomfort in both shoulder joints and both elbow joints.  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  We will check RF, anti-CCP, 14 3 3  eta, sed rate today. We discussed starting her on a trial of Plaquenil.  She will take 200 mg 1 tablet twice daily Monday through Friday.   She is advised to notify us if she cannot tolerate taking Plaquenil.  Indications, contraindications, potential side effects of Plaquenil were discussed today.  All questions were addressed and consent was obtained.  A prescription for Plaquenil sent to pharmacy today.  CBC and CMP were drawn today.  She will be due to update lab work in 1 month then 3 months and every 5 months while taking Plaquenil.  She will require baseline Plaquenil eye exam within 1 month of starting on Plaquenil.  She will follow-up in the office in 6 weeks.  Plan: hydroxychloroquine (PLAQUENIL) 200 MG tablet  Patient was counseled on the purpose, proper use, and adverse effects of hydroxychloroquine including nausea/diarrhea, skin rash, headaches, and sun sensitivity.  Discussed importance of annual eye exams while on hydroxychloroquine to monitor to ocular toxicity and discussed importance of frequent laboratory monitoring.  Provided patient with eye exam form for baseline ophthalmologic exam.  Provided patient with educational materials on hydroxychloroquine and answered all questions.  Patient consented to hydroxychloroquine.  Will upload consent in the media tab.    Dose will be Plaquenil 200 mg twice daily Monday through Friday.  Prescription pending lab results.  High risk medication use -Plaquenil 200 mg 1 tablet twice daily Monday through Friday.  CBC and CMP were drawn today.  She will return for lab work in 1 month in 3 months and every 5 months while taking Plaquenil.  She will require baseline Plaquenil eye exam within 1 month of starting on Plaquenil.- Plan: COMPLETE METABOLIC PANEL WITH GFR, CBC with Differential/Platelet  Pseudogout involving multiple joints: She continues to have recurrent flares.  She increased the dose of colchicine to 0.6 mg 1 capsule by mouth daily for the past 1 week but has not noticed much improvement.  She will continue taking colchicine as prescribed.  A refill  was sent to the pharmacy  today.  Pain in both hands -She has been experiencing increased pain and intermittent inflammation in both hands and both wrist joints.  We will obtain the following lab work today.  X-rays of both hands were obtained.  Plan: 14-3-3 eta Protein, Cyclic citrul peptide antibody, IgG, Rheumatoid factor, Sedimentation rate, XR Hand 2 View Left, XR Hand 2 View Right  Pain in both feet - She has been experiencing increased pain in both feet.  She has tenderness of the right first MTP joint.  X-rays of both feet were obtained.  Plan: XR Foot 2 Views Left, XR Foot 2 Views Right  Primary osteoarthritis of both hands: She has PIP and DIP synovial thickening consistent with osteoarthritis of both hands. She has been experiencing increased pain in both hands.  X-rays were obtained today.   Bilateral primary osteoarthritis of hip: She has good ROM with no discomfort at this time.  Primary osteoarthritis of right knee: She has good ROM with no warmth or effusion.   S/P total knee replacement, left - Dr. Thurston Hole October 2020-Doing well.  She has slightly limited extension.  No warmth or effusion noted.   Primary osteoarthritis of both feet: Chronic pain.  She has tenderness of the right 1st MTP joint. No synovitis was noted.  She has been experiencing increased pain in both feet.  X-rays of both feet were updated today.   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  No symptoms of radiculopathy at this time.   Hyperuricemia - Uric acid was 4.9 on 09/23/2018.  Other medical conditions are listed as follows:   NAFLD (nonalcoholic fatty liver disease)  Small fiber neuropathy  Essential hypertension  h/o Hiatal hernia  Vitamin D deficiency  Primary insomnia  History of kidney stones  Orders: Orders Placed This Encounter  Procedures  . XR Hand 2 View Left  . XR Hand 2 View Right  . XR Foot 2 Views Left  . XR Foot 2 Views Right  . 14-3-3 eta Protein  . Cyclic  citrul peptide antibody, IgG  . Rheumatoid factor  . Sedimentation rate  . COMPLETE METABOLIC PANEL WITH GFR  . CBC with Differential/Platelet   Meds ordered this encounter  Medications  . Colchicine (MITIGARE) 0.6 MG CAPS    Sig: Take 1 capsule by mouth daily.    Dispense:  30 capsule    Refill:  0  . hydroxychloroquine (PLAQUENIL) 200 MG tablet    Sig: Take 200mg  by mouth twice daily, Monday-Friday only. None on Saturday or Sunday.    Dispense:  40 tablet    Refill:  0    Face-to-face time spent with patient was 30 minutes. Greater than 50% of time was spent in counseling and coordination of care.  Follow-Up Instructions: Return in 6 weeks (on 04/13/2019) for Pseudogout , Osteoarthritis, inflammatory arthritis.   06/13/2019, PA-C   I examined and evaluated the patient with Gearldine Bienenstock PA.  Patient continues to have some joint pain and joint swelling.  We discussed possibility of seronegative rheumatoid arthritis.  Although her x-rays do show chondrocalcinosis.  After discussing different treatment options we decided to give her a trial of Plaquenil over the next 3 months.  The plan of care was discussed as noted above.  Sherron Ales, MD  Note - This record has been created using Pollyann Savoy.  Chart creation errors have been sought, but may not always  have been located. Such creation  errors do not reflect on  the standard of medical care.

## 2019-02-24 ENCOUNTER — Ambulatory Visit: Payer: Self-pay | Admitting: Podiatry

## 2019-03-02 ENCOUNTER — Encounter: Payer: Self-pay | Admitting: Physician Assistant

## 2019-03-02 ENCOUNTER — Ambulatory Visit: Payer: Self-pay

## 2019-03-02 ENCOUNTER — Ambulatory Visit (INDEPENDENT_AMBULATORY_CARE_PROVIDER_SITE_OTHER): Payer: BC Managed Care – PPO | Admitting: Physician Assistant

## 2019-03-02 ENCOUNTER — Other Ambulatory Visit: Payer: Self-pay

## 2019-03-02 VITALS — BP 157/84 | HR 74 | Resp 13 | Ht 66.0 in | Wt 201.0 lb

## 2019-03-02 DIAGNOSIS — M79672 Pain in left foot: Secondary | ICD-10-CM | POA: Diagnosis not present

## 2019-03-02 DIAGNOSIS — Z79899 Other long term (current) drug therapy: Secondary | ICD-10-CM

## 2019-03-02 DIAGNOSIS — M1990 Unspecified osteoarthritis, unspecified site: Secondary | ICD-10-CM

## 2019-03-02 DIAGNOSIS — M19041 Primary osteoarthritis, right hand: Secondary | ICD-10-CM

## 2019-03-02 DIAGNOSIS — F5101 Primary insomnia: Secondary | ICD-10-CM

## 2019-03-02 DIAGNOSIS — M1189 Other specified crystal arthropathies, multiple sites: Secondary | ICD-10-CM

## 2019-03-02 DIAGNOSIS — M79642 Pain in left hand: Secondary | ICD-10-CM | POA: Diagnosis not present

## 2019-03-02 DIAGNOSIS — M79671 Pain in right foot: Secondary | ICD-10-CM

## 2019-03-02 DIAGNOSIS — M160 Bilateral primary osteoarthritis of hip: Secondary | ICD-10-CM

## 2019-03-02 DIAGNOSIS — M19072 Primary osteoarthritis, left ankle and foot: Secondary | ICD-10-CM

## 2019-03-02 DIAGNOSIS — M47816 Spondylosis without myelopathy or radiculopathy, lumbar region: Secondary | ICD-10-CM

## 2019-03-02 DIAGNOSIS — M19042 Primary osteoarthritis, left hand: Secondary | ICD-10-CM

## 2019-03-02 DIAGNOSIS — M79641 Pain in right hand: Secondary | ICD-10-CM | POA: Diagnosis not present

## 2019-03-02 DIAGNOSIS — I10 Essential (primary) hypertension: Secondary | ICD-10-CM

## 2019-03-02 DIAGNOSIS — K760 Fatty (change of) liver, not elsewhere classified: Secondary | ICD-10-CM

## 2019-03-02 DIAGNOSIS — Z96652 Presence of left artificial knee joint: Secondary | ICD-10-CM

## 2019-03-02 DIAGNOSIS — M19071 Primary osteoarthritis, right ankle and foot: Secondary | ICD-10-CM

## 2019-03-02 DIAGNOSIS — K449 Diaphragmatic hernia without obstruction or gangrene: Secondary | ICD-10-CM

## 2019-03-02 DIAGNOSIS — Z87442 Personal history of urinary calculi: Secondary | ICD-10-CM

## 2019-03-02 DIAGNOSIS — G629 Polyneuropathy, unspecified: Secondary | ICD-10-CM

## 2019-03-02 DIAGNOSIS — M1380 Other specified arthritis, unspecified site: Secondary | ICD-10-CM

## 2019-03-02 DIAGNOSIS — E559 Vitamin D deficiency, unspecified: Secondary | ICD-10-CM

## 2019-03-02 DIAGNOSIS — M1711 Unilateral primary osteoarthritis, right knee: Secondary | ICD-10-CM

## 2019-03-02 DIAGNOSIS — E790 Hyperuricemia without signs of inflammatory arthritis and tophaceous disease: Secondary | ICD-10-CM

## 2019-03-02 MED ORDER — COLCHICINE 0.6 MG PO CAPS: 1.0000 | ORAL_CAPSULE | Freq: Every day | ORAL | 0 refills | Status: DC

## 2019-03-02 MED ORDER — HYDROXYCHLOROQUINE SULFATE 200 MG PO TABS: ORAL_TABLET | ORAL | 0 refills | Status: DC

## 2019-03-02 NOTE — Patient Instructions (Signed)
Standing Labs We placed an order today for your standing lab work.    Please come back and get your standing labs in 1 month, 3 months and then every 5 months.   We have open lab daily Monday through Thursday from 8:30-12:30 PM and 1:30-4:30 PM and Friday from 8:30-12:30 PM and 1:30-4:00 PM at the office of Dr. Shaili Deveshwar.   You may experience shorter wait times on Monday and Friday afternoons. The office is located at [address], [address], [place], [place] [address] No appointment is necessary.   Labs are drawn by [facility].  You may receive a bill from [facility] for your lab work.  If you wish to have your labs drawn at another location, please call the office 24 hours in advance to send orders.  If you have any questions regarding directions or hours of operation,  please call [phone].   Just as a reminder please drink plenty of water prior to coming for your lab work. Thanks!   Hydroxychloroquine tablets What is this medicine? HYDROXYCHLOROQUINE (hye drox ee KLOR oh kwin) is used to treat rheumatoid arthritis and systemic lupus erythematosus. It is also used to treat malaria. This medicine may be used for other purposes; ask your health care provider or pharmacist if you have questions. COMMON BRAND NAME(S): Plaquenil, Quineprox What should I tell my health care provider before I take this medicine? They need to know if you have any of these conditions:  diabetes  eye disease, vision problems  G6PD deficiency  heart disease  history of irregular heartbeat  if you often drink alcohol  kidney disease  liver disease  porphyria  psoriasis  an unusual or allergic reaction to chloroquine, hydroxychloroquine, other medicines, foods, dyes, or preservatives  pregnant or trying to get pregnant  breast-feeding How should I use this medicine? Take this medicine by mouth with a glass of water. Follow the directions on the prescription label. Do not cut, crush  or chew this medicine. Swallow the tablets whole. Take this medicine with food. Avoid taking antacids within 4 hours of taking this medicine. It is best to separate these medicines by at least 4 hours. Take your medicine at regular intervals. Do not take it more often than directed. Take all of your medicine as directed even if you think you are better. Do not skip doses or stop your medicine early. Talk to your pediatrician regarding the use of this medicine in children. While this drug may be prescribed for selected conditions, precautions do apply. Overdosage: If you think you have taken too much of this medicine contact a poison control center or emergency room at once. NOTE: This medicine is only for you. Do not share this medicine with others. What if I miss a dose? If you miss a dose, take it as soon as you can. If it is almost time for your next dose, take only that dose. Do not take double or extra doses. What may interact with this medicine? Do not take this medicine with any of the following medications:  cisapride  dronedarone  pimozide  thioridazine This medicine may also interact with the following medications:  ampicillin  antacids  cimetidine  cyclosporine  digoxin  kaolin  medicines for diabetes, like insulin, glipizide, glyburide  medicines for seizures like carbamazepine, phenobarbital, phenytoin  mefloquine  methotrexate  other medicines that prolong the QT interval (cause an abnormal heart rhythm)  praziquantel This list may not describe all possible interactions. Give your health care   provider a list of all the medicines, herbs, non-prescription drugs, or dietary supplements you use. Also tell them if you smoke, drink alcohol, or use illegal drugs. Some items may interact with your medicine. What should I watch for while using this medicine? Visit your health care professional for regular checks on your progress. Tell your health care professional if  your symptoms do not start to get better or if they get worse. You may need blood work done while you are taking this medicine. If you take other medicines that can affect heart rhythm, you may need more testing. Talk to your health care professional if you have questions. Your vision may be tested before and during use of this medicine. Tell your health care professional right away if you have any change in your eyesight. What side effects may I notice from receiving this medicine? Side effects that you should report to your doctor or health care professional as soon as possible:  allergic reactions like skin rash, itching or hives, swelling of the face, lips, or tongue  changes in vision  decreased hearing or ringing of the ears  muscle weakness  redness, blistering, peeling or loosening of the skin, including inside the mouth  sensitivity to light  signs and symptoms of a dangerous change in heartbeat or heart rhythm like chest pain; dizziness; fast or irregular heartbeat; palpitations; feeling faint or lightheaded, falls; breathing problems  signs and symptoms of liver injury like dark yellow or brown urine; general ill feeling or flu-like symptoms; light-colored stools; loss of appetite; nausea; right upper belly pain; unusually weak or tired; yellowing of the eyes or skin  signs and symptoms of low blood sugar such as feeling anxious; confusion; dizziness; increased hunger; unusually weak or tired; sweating; shakiness; cold; irritable; headache; blurred vision; fast heartbeat; loss of consciousness  suicidal thoughts  uncontrollable head, mouth, neck, arm, or leg movements Side effects that usually do not require medical attention (report to your doctor or health care professional if they continue or are bothersome):  diarrhea  dizziness  hair loss  headache  irritable  loss of appetite  nausea, vomiting  stomach pain This list may not describe all possible side  effects. Call your doctor for medical advice about side effects. You may report side effects to FDA at 1-800-FDA-1088. Where should I keep my medicine? Keep out of the reach of children. Store at room temperature between 15 and 30 degrees C (59 and 86 degrees F). Protect from moisture and light. Throw away any unused medicine after the expiration date. NOTE: This sheet is a summary. It may not cover all possible information. If you have questions about this medicine, talk to your doctor, pharmacist, or health care provider.  2020 Elsevier/Gold Standard (2018-05-03 12:56:32)  

## 2019-03-08 IMAGING — MG DIGITAL DIAGNOSTIC BILATERAL MAMMOGRAM WITH TOMO AND CAD
8 of 14 series · 8 of 40 positions shown · non-contrast
Comparison: Previous exam(s).

CLINICAL DATA: Intermittent nonfocal burning sensations in the left
breast, more toward the anterior portion of the breast, for the past
2 months.

EXAM:
DIGITAL DIAGNOSTIC BILATERAL MAMMOGRAM WITH CAD AND TOMO

[R MLO synth-2D (1 of 2)]
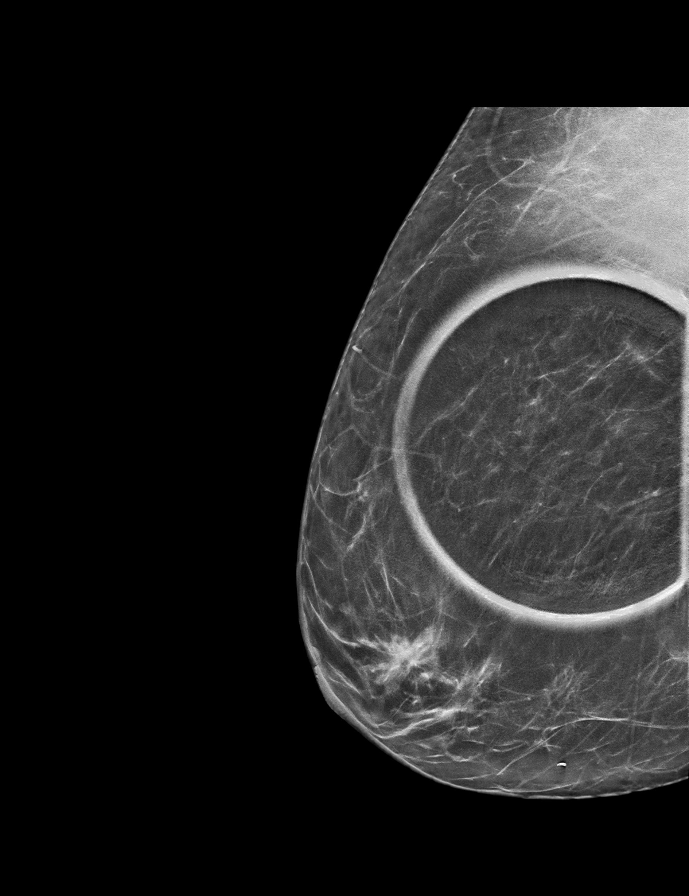

[R CC synth-2D (1 of 2)]
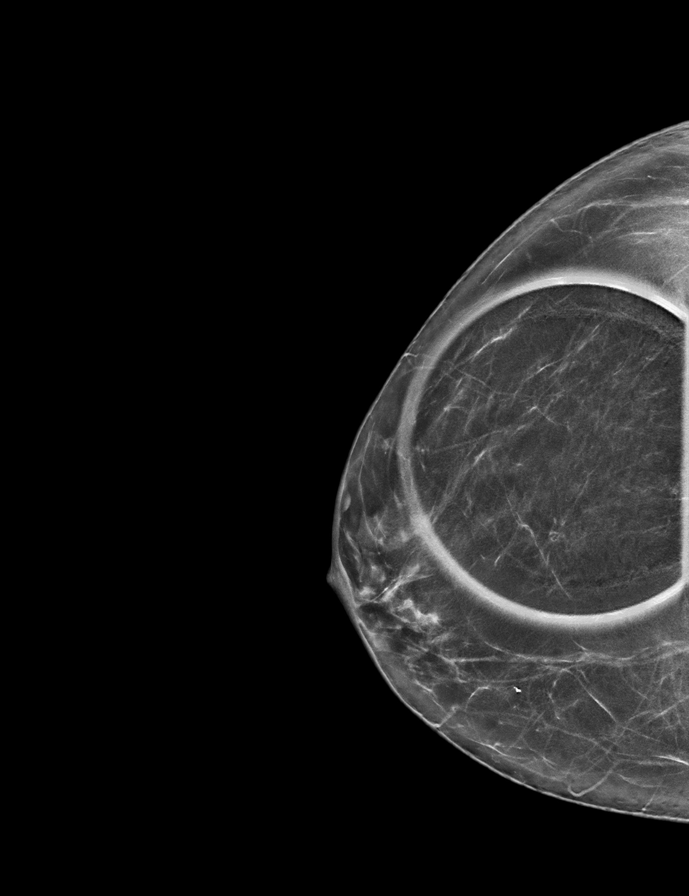

[R ML synth-2D]
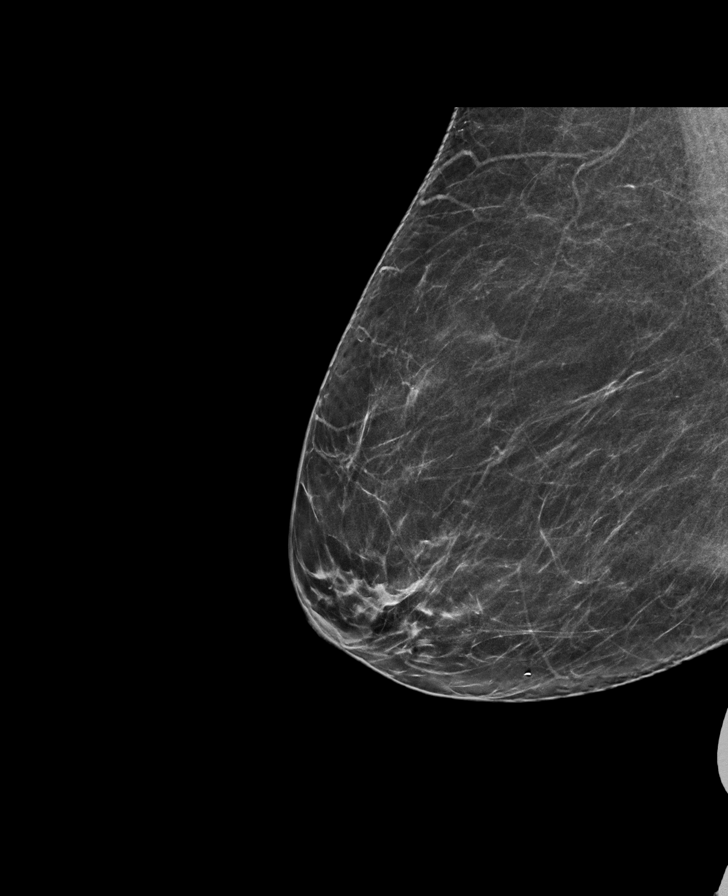

[R MLO synth-2D (2 of 2)]
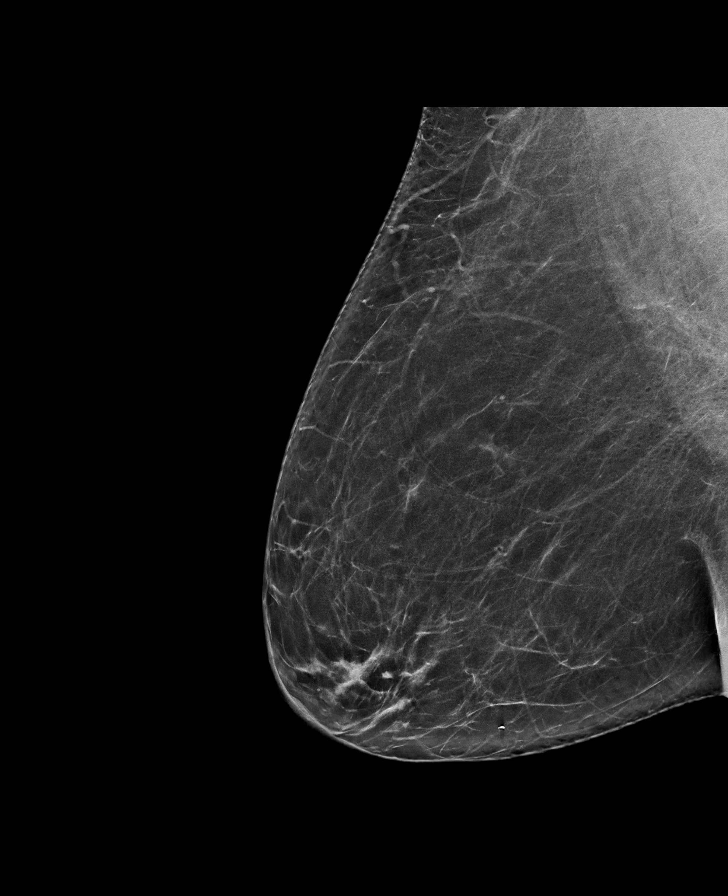

[L MLO synth-2D]
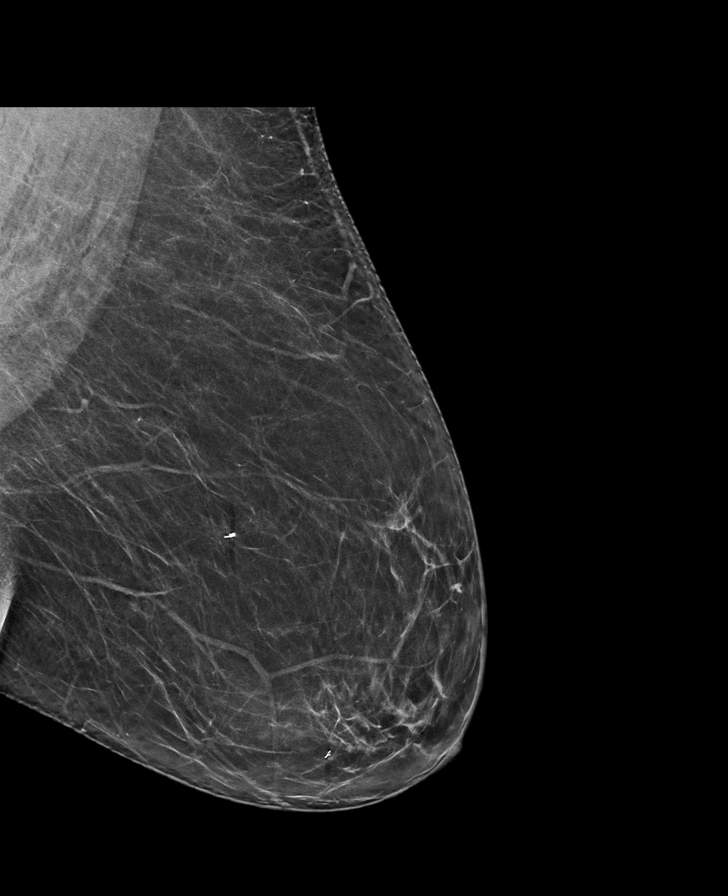

[R CC synth-2D (2 of 2)]
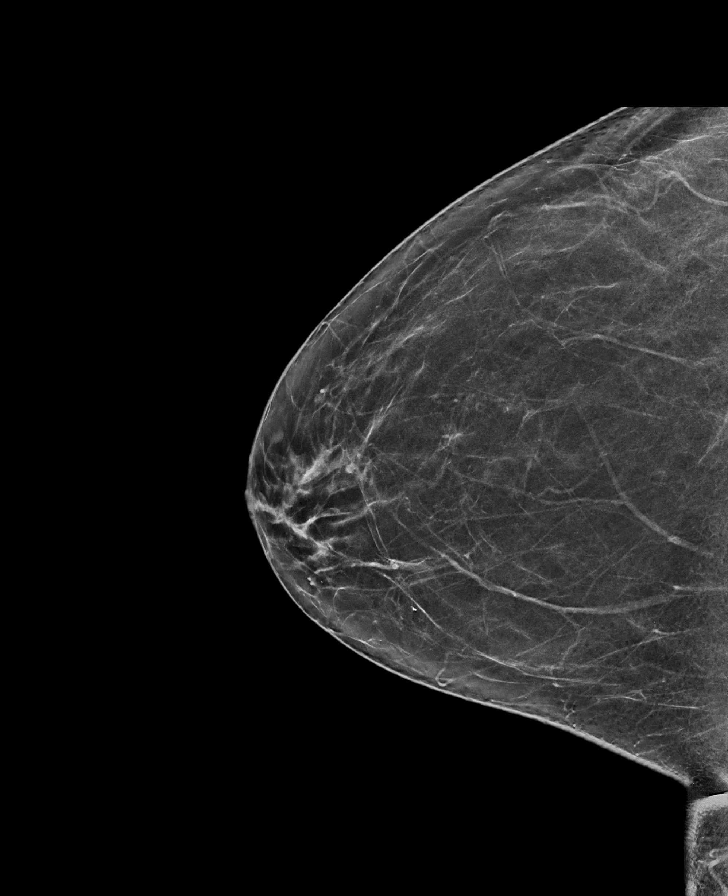

[L CC synth-2D]
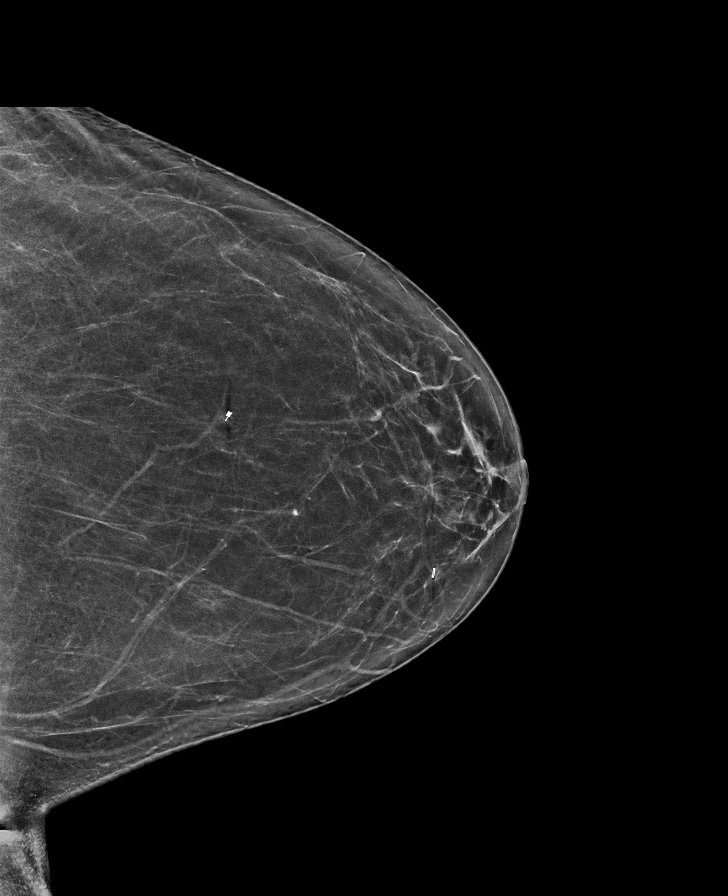

[R ML tomo · tomo slice 35/68.0]
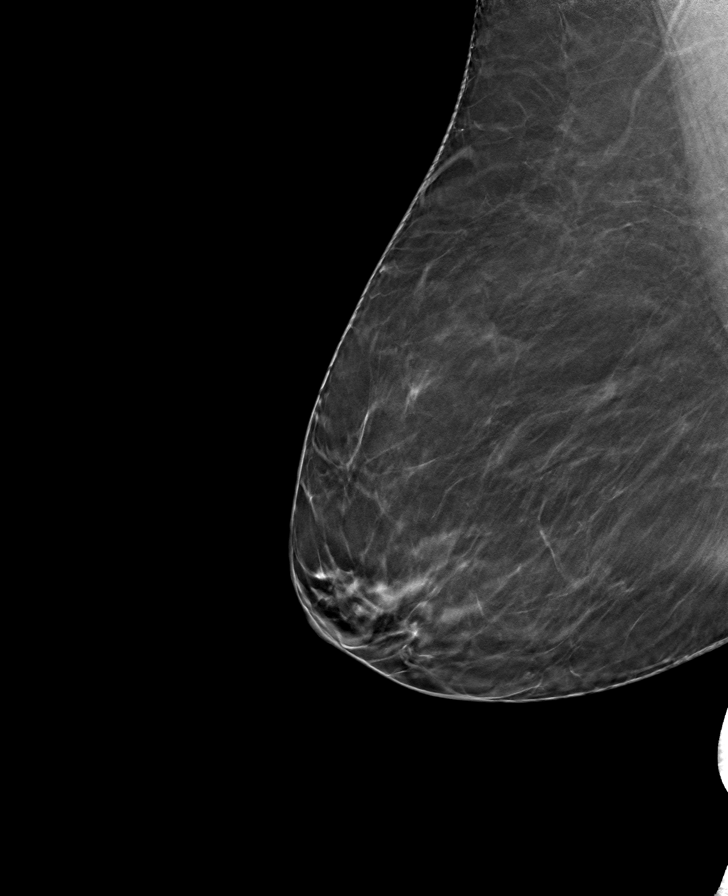

[8 of 40 positions shown; findings below may reference images not displayed]

ACR Breast Density Category b: There are scattered areas of
fibroglandular density.
FINDINGS: Stable mammographic appearance of the breasts with no interval
abnormalities at the biopsy sites on the left. No findings
suspicious for malignancy in either breast.

Mammographic images were processed with CAD.
IMPRESSION: No evidence of malignancy.

RECOMMENDATION:
Bilateral screening mammogram in 1 year.

I have discussed the findings and recommendations with the patient.
Results were also provided in writing at the conclusion of the
visit. If applicable, a reminder letter will be sent to the patient
regarding the next appointment.

BI-RADS CATEGORY  1: Negative.

## 2019-03-09 ENCOUNTER — Telehealth: Payer: Self-pay | Admitting: Rheumatology

## 2019-03-09 MED ORDER — COLCHICINE 0.6 MG PO CAPS: 1.0000 | ORAL_CAPSULE | Freq: Every day | ORAL | 0 refills | Status: DC

## 2019-03-09 NOTE — Telephone Encounter (Signed)
Please resend rx for Colchicine to the mail order pharmacy. Fax # [phone] [facility] it, but needs to go to mail order.

## 2019-03-09 NOTE — Telephone Encounter (Signed)
Prescription resent to the mail order pharmacy.

## 2019-03-10 ENCOUNTER — Telehealth: Payer: Self-pay | Admitting: *Deleted

## 2019-03-10 LAB — COMPLETE METABOLIC PANEL WITH GFR
AG Ratio: 1.9 (calc) (ref 1.0–2.5)
ALT: 28 U/L (ref 6–29)
AST: 27 U/L (ref 10–35)
Albumin: 4.6 g/dL (ref 3.6–5.1)
Alkaline phosphatase (APISO): 77 U/L (ref 37–153)
BUN: 14 mg/dL (ref 7–25)
CO2: 26 mmol/L (ref 20–32)
Calcium: 10.1 mg/dL (ref 8.6–10.4)
Chloride: 106 mmol/L (ref 98–110)
Creat: 0.53 mg/dL (ref 0.50–0.99)
GFR, Est African American: 119 mL/min/{1.73_m2} (ref >=60)
GFR, Est Non African American: 102 mL/min/{1.73_m2} (ref >=60)
Globulin: 2.4 g/dL (calc) (ref 1.9–3.7)
Glucose, Bld: 100 mg/dL — ABNORMAL HIGH (ref 65–99)
Potassium: 4.3 mmol/L (ref 3.5–5.3)
Sodium: 142 mmol/L (ref 135–146)
Total Bilirubin: 0.4 mg/dL (ref 0.2–1.2)
Total Protein: 7 g/dL (ref 6.1–8.1)

## 2019-03-10 LAB — CBC WITH DIFFERENTIAL/PLATELET
Absolute Monocytes: 469 cells/uL (ref 200–950)
Basophils Absolute: 48 cells/uL (ref 0–200)
Basophils Relative: 0.7 %
Eosinophils Absolute: 102 cells/uL (ref 15–500)
Eosinophils Relative: 1.5 %
HCT: 42.9 % (ref 35.0–45.0)
Hemoglobin: 14.5 g/dL (ref 11.7–15.5)
Lymphs Abs: 2006 cells/uL (ref 850–3900)
MCH: 31.5 pg (ref 27.0–33.0)
MCHC: 33.8 g/dL (ref 32.0–36.0)
MCV: 93.3 fL (ref 80.0–100.0)
MPV: 10.3 fL (ref 7.5–12.5)
Monocytes Relative: 6.9 %
Neutro Abs: 4175 cells/uL (ref 1500–7800)
Neutrophils Relative %: 61.4 %
Platelets: 252 10*3/uL (ref 140–400)
RBC: 4.6 10*6/uL (ref 3.80–5.10)
RDW: 12.8 % (ref 11.0–15.0)
Total Lymphocyte: 29.5 %
WBC: 6.8 10*3/uL (ref 3.8–10.8)

## 2019-03-10 LAB — CYCLIC CITRUL PEPTIDE ANTIBODY, IGG: Cyclic Citrullin Peptide Ab: <16 UNITS

## 2019-03-10 LAB — RHEUMATOID FACTOR: Rheumatoid fact SerPl-aCnc: <14 IU/mL (ref <14)

## 2019-03-10 LAB — 14-3-3 ETA PROTEIN: 14-3-3 eta Protein: <0.2 ng/mL (ref <0.2)

## 2019-03-10 LAB — SEDIMENTATION RATE: Sed Rate: 2 mm/h (ref 0–30)

## 2019-03-10 NOTE — Telephone Encounter (Signed)
Attempted to contact the patient and left message for patient to call the office.  

## 2019-03-10 NOTE — Telephone Encounter (Signed)
Patient was in the office on 03/02/19 and was prescribed PLQ. Patient started the prescription 03/07/19. Patient states she wanted to verify that you wanted her to continue the PLQ even thought her labs were normal. Please advise.

## 2019-03-10 NOTE — Telephone Encounter (Signed)
Please advise patient to start taking plaquenil as discussed.  We want her to take a trial of Plaquenil to see if it improves her symptoms. Please advise patient to notify us if she cannot tolerate PLQ.

## 2019-03-11 NOTE — Telephone Encounter (Signed)
Patient advised to start taking plaquenil as discussed.  Patient advised Dr. Corliss Skains wants her to take a trial of Plaquenil to see if it improves her symptoms. Patient advised  to notify us if she cannot tolerate PLQ.

## 2019-03-17 ENCOUNTER — Ambulatory Visit: Payer: BC Managed Care – PPO | Admitting: Family Medicine

## 2019-03-22 ENCOUNTER — Other Ambulatory Visit: Payer: Self-pay | Admitting: Family Medicine

## 2019-03-22 ENCOUNTER — Telehealth: Payer: Self-pay | Admitting: Family Medicine

## 2019-03-22 NOTE — Telephone Encounter (Signed)
Patient called states pharmacy request for Rx/ Lexapro refill denied for (Appt)-- pt scheduled for Provider's 1st available which isn't til  4/13 & is requesting an emergency supply that will get her to Appt date.  --Forwarding request to med asst for provider Review/Approval on refill of:  escitalopram (LEXAPRO) 20 MG tablet [[id number]]   Order Details Dose: 20 mg Route: Oral Frequency: Daily at bedtime  Dispense Quantity: 15 tablet Refills: 0       Sig: Take 1 tablet (20 mg total) by mouth at bedtime. **PATIENT NEEDS APT FOR ANY FURTHER REFILLS**   ---  If approved send refill order to :  Preferred Pharmacies      [facility] - [place], [place] - [address] [phone] (Phone) [phone] (Fax)   --glh

## 2019-03-22 NOTE — Telephone Encounter (Signed)
Last seen 10/06/18.     Ok to give pt ONLY 30 tabs -  NO ReFills to be given until seen- no exceptions.

## 2019-03-22 NOTE — Telephone Encounter (Signed)
Patient was last seen 07/19/18 and advised to follow up in 3 months.   Patient last refill of Lexapro given 01/04/19 #15 with no refills.   Patient pharmacy sent over refill request today and I denied it. Patient is now scheduled for 04/19/19 and is requesting Lexapro 'emergency' refill.   Please advise. AS< CMA

## 2019-03-23 ENCOUNTER — Telehealth: Payer: Self-pay | Admitting: *Deleted

## 2019-03-23 MED ORDER — ESCITALOPRAM OXALATE 20 MG PO TABS: 20.0000 mg | ORAL_TABLET | Freq: Every day | ORAL | 0 refills | Status: DC

## 2019-03-23 NOTE — Telephone Encounter (Signed)
30 day supply of Lexapro sent to pharmacy. Can you please advise patient of this. AS< CMA

## 2019-03-23 NOTE — Telephone Encounter (Signed)
Spoke with patient, she wanted to verify that she was okay to get her baseline eye exam within the first 4-6 weeks. patient advised that would be okay. Reminded patient that she will be due for labs after she has been on the medication for 1 month. Patient advised once labs are done and we have eye exam we can send in prescription for a 90 day supply to mail order pharmacy.

## 2019-03-29 ENCOUNTER — Ambulatory Visit (INDEPENDENT_AMBULATORY_CARE_PROVIDER_SITE_OTHER): Payer: BC Managed Care – PPO | Admitting: Podiatry

## 2019-03-29 ENCOUNTER — Other Ambulatory Visit: Payer: Self-pay | Admitting: Rheumatology

## 2019-03-29 ENCOUNTER — Other Ambulatory Visit: Payer: Self-pay

## 2019-03-29 ENCOUNTER — Encounter: Payer: Self-pay | Admitting: Podiatry

## 2019-03-29 ENCOUNTER — Ambulatory Visit (INDEPENDENT_AMBULATORY_CARE_PROVIDER_SITE_OTHER): Payer: BC Managed Care – PPO

## 2019-03-29 VITALS — Temp 96.8°F

## 2019-03-29 DIAGNOSIS — Q828 Other specified congenital malformations of skin: Secondary | ICD-10-CM

## 2019-03-29 DIAGNOSIS — M1990 Unspecified osteoarthritis, unspecified site: Secondary | ICD-10-CM

## 2019-03-29 DIAGNOSIS — M7752 Other enthesopathy of left foot: Secondary | ICD-10-CM | POA: Diagnosis not present

## 2019-03-29 MED ORDER — HYDROXYCHLOROQUINE SULFATE 200 MG PO TABS: ORAL_TABLET | ORAL | 0 refills | Status: DC

## 2019-03-29 NOTE — Telephone Encounter (Signed)
Ok to refill 30-day supply

## 2019-03-29 NOTE — Telephone Encounter (Signed)
Patient left a voicemail requesting prescription refill of Plaquenil.   

## 2019-03-29 NOTE — Progress Notes (Signed)
She presents today states that I have a hard knot on the plantar aspect of my left foot as she points to the fifth metatarsal area left.  States that she has had some lateral ankle pain that has come about secondary to her knee repair.  She had a total knee performed by Dr. Noemi Chapel 5 months ago.  Objective: Vital signs are stable she is alert and oriented x3 pulses are palpable left foot.  She has an area of erythema and fluctuance beneath the fifth metatarsal head of the left foot consistent with a bursitis and a porokeratotic lesion that is painful on palpation as well.  Radiographs not do not demonstrate any type of osseous abnormalities.  Assessment: Bursitis subfifth met head left porokeratosis subfifth met head left.  Plan: I injected the area today with 2 mg of dexamethasone local anesthetic for the bursitis.  Debrided the reactive hyperkeratotic lesion placed Neosporin ointment and a Band-Aid.  Instructed her to follow-up with me on an as-needed basis.

## 2019-03-29 NOTE — Telephone Encounter (Signed)
Last Visit: 03/02/19 Next Visit: 05/27/19  Labs: 03/02/19 CBC and CMP WNL No baseline PLQ eye exam on file. Patient has appointment scheduled for eye exam. (Started 03/02/19)  Okay to refill 30 day supply PLQ?

## 2019-04-01 ENCOUNTER — Other Ambulatory Visit: Payer: Self-pay | Admitting: Physician Assistant

## 2019-04-01 DIAGNOSIS — M1990 Unspecified osteoarthritis, unspecified site: Secondary | ICD-10-CM

## 2019-04-13 ENCOUNTER — Other Ambulatory Visit: Payer: Self-pay | Admitting: *Deleted

## 2019-04-13 DIAGNOSIS — Z79899 Other long term (current) drug therapy: Secondary | ICD-10-CM

## 2019-04-13 DIAGNOSIS — Z5181 Encounter for therapeutic drug level monitoring: Secondary | ICD-10-CM

## 2019-04-13 DIAGNOSIS — Z6834 Body mass index (BMI) 34.0-34.9, adult: Secondary | ICD-10-CM | POA: Diagnosis not present

## 2019-04-13 DIAGNOSIS — Z01419 Encounter for gynecological examination (general) (routine) without abnormal findings: Secondary | ICD-10-CM | POA: Diagnosis not present

## 2019-04-13 DIAGNOSIS — Z1231 Encounter for screening mammogram for malignant neoplasm of breast: Secondary | ICD-10-CM | POA: Diagnosis not present

## 2019-04-14 ENCOUNTER — Telehealth: Payer: Self-pay | Admitting: *Deleted

## 2019-04-14 LAB — COMPLETE METABOLIC PANEL WITH GFR
AG Ratio: 2.2 (calc) (ref 1.0–2.5)
ALT: 20 U/L (ref 6–29)
AST: 20 U/L (ref 10–35)
Albumin: 4.2 g/dL (ref 3.6–5.1)
Alkaline phosphatase (APISO): 76 U/L (ref 37–153)
BUN: 18 mg/dL (ref 7–25)
CO2: 26 mmol/L (ref 20–32)
Calcium: 9.6 mg/dL (ref 8.6–10.4)
Chloride: 106 mmol/L (ref 98–110)
Creat: 0.62 mg/dL (ref 0.50–0.99)
GFR, Est African American: 113 mL/min/{1.73_m2} (ref >=60)
GFR, Est Non African American: 97 mL/min/{1.73_m2} (ref >=60)
Globulin: 1.9 g/dL (calc) (ref 1.9–3.7)
Glucose, Bld: 109 mg/dL — ABNORMAL HIGH (ref 65–99)
Potassium: 4.2 mmol/L (ref 3.5–5.3)
Sodium: 142 mmol/L (ref 135–146)
Total Bilirubin: 0.3 mg/dL (ref 0.2–1.2)
Total Protein: 6.1 g/dL (ref 6.1–8.1)

## 2019-04-14 LAB — CBC WITH DIFFERENTIAL/PLATELET
Absolute Monocytes: 402 cells/uL (ref 200–950)
Basophils Absolute: 50 cells/uL (ref 0–200)
Basophils Relative: 0.9 %
Eosinophils Absolute: 99 cells/uL (ref 15–500)
Eosinophils Relative: 1.8 %
HCT: 43.3 % (ref 35.0–45.0)
Hemoglobin: 14.5 g/dL (ref 11.7–15.5)
Lymphs Abs: 1645 cells/uL (ref 850–3900)
MCH: 32 pg (ref 27.0–33.0)
MCHC: 33.5 g/dL (ref 32.0–36.0)
MCV: 95.6 fL (ref 80.0–100.0)
MPV: 10.5 fL (ref 7.5–12.5)
Monocytes Relative: 7.3 %
Neutro Abs: 3306 cells/uL (ref 1500–7800)
Neutrophils Relative %: 60.1 %
Platelets: 222 10*3/uL (ref 140–400)
RBC: 4.53 10*6/uL (ref 3.80–5.10)
RDW: 12.5 % (ref 11.0–15.0)
Total Lymphocyte: 29.9 %
WBC: 5.5 10*3/uL (ref 3.8–10.8)

## 2019-04-14 MED ORDER — COLCHICINE 0.6 MG PO CAPS: 1.0000 | ORAL_CAPSULE | Freq: Every day | ORAL | 0 refills | Status: DC

## 2019-04-14 NOTE — Telephone Encounter (Signed)
Patient states she tried to get prescription of Mitigare from [facility]. She states it was going to be to expensinve and request for the prescription to be sent to local pharmacy [facility].   Last Visit: 03/02/19 Next Visit: 05/27/19 Labs: 04/13/19 Glucose is 109. Rest of CMP WNL.  Okay to refill per Dr. Corliss Skains   Patient states she is experiencing fatigue and "run down" since starting PLQ. Patient states she started on PLQ 1 tablet po bid Mon-Fri. Patient states the fatigue was worse when on 2 tablets. Patient states she cut back to 1 tablet daily about 1 week ago and her fatigue slightly improved. Patient states she has been having GI symptoms as well. They slightly improved with the reduced dose of PLQ. Please advise.

## 2019-04-14 NOTE — Telephone Encounter (Signed)
It is okay for her to stay on Plaquenil 1 tablet daily.  If she cannot tolerate Plaquenil 1 tablet daily then she may discontinue it.

## 2019-04-14 NOTE — Progress Notes (Signed)
Glucose is 109. Rest of CMP WNL

## 2019-04-14 NOTE — Telephone Encounter (Signed)
Patient advised it is okay for her to stay on Plaquenil 1 tablet daily.  If she cannot tolerate Plaquenil 1 tablet daily then she may discontinue it.

## 2019-04-14 NOTE — Progress Notes (Signed)
CBC is normal.

## 2019-04-19 ENCOUNTER — Other Ambulatory Visit: Payer: Self-pay

## 2019-04-19 ENCOUNTER — Encounter: Payer: Self-pay | Admitting: Family Medicine

## 2019-04-19 ENCOUNTER — Ambulatory Visit (INDEPENDENT_AMBULATORY_CARE_PROVIDER_SITE_OTHER): Payer: BC Managed Care – PPO | Admitting: Family Medicine

## 2019-04-19 VITALS — Ht 66.0 in | Wt 198.0 lb

## 2019-04-19 DIAGNOSIS — E559 Vitamin D deficiency, unspecified: Secondary | ICD-10-CM | POA: Diagnosis not present

## 2019-04-19 DIAGNOSIS — G479 Sleep disorder, unspecified: Secondary | ICD-10-CM | POA: Diagnosis not present

## 2019-04-19 DIAGNOSIS — M1189 Other specified crystal arthropathies, multiple sites: Secondary | ICD-10-CM | POA: Diagnosis not present

## 2019-04-19 DIAGNOSIS — F4323 Adjustment disorder with mixed anxiety and depressed mood: Secondary | ICD-10-CM | POA: Diagnosis not present

## 2019-04-19 DIAGNOSIS — E039 Hypothyroidism, unspecified: Secondary | ICD-10-CM

## 2019-04-19 DIAGNOSIS — M159 Polyosteoarthritis, unspecified: Secondary | ICD-10-CM

## 2019-04-19 DIAGNOSIS — R7303 Prediabetes: Secondary | ICD-10-CM

## 2019-04-19 MED ORDER — ESCITALOPRAM OXALATE 10 MG PO TABS: 10.0000 mg | ORAL_TABLET | Freq: Every day | ORAL | 1 refills | Status: DC

## 2019-04-19 MED ORDER — VITAMIN D (ERGOCALCIFEROL) 1.25 MG (50000 UNIT) PO CAPS: 50000.0000 [IU] | ORAL_CAPSULE | ORAL | 1 refills | Status: DC

## 2019-04-19 NOTE — Progress Notes (Signed)
Telehealth office visit note for Marissa Larson, D.O- at Primary Care at [facility]   I connected with current patient today and verified that I am speaking with the correct person   . Location of the patient: Home . Location of the provider: Office - This visit type was conducted due to national recommendations for restrictions regarding the COVID-19 Pandemic (e.g. social distancing) in an effort to limit this patient's exposure and mitigate transmission in our community.    - No physical exam could be performed with this format, beyond that communicated to [place] by the patient/ family members as noted.   - Additionally my office staff/ schedulers were to discuss with the patient that there may be a monetary charge related to this service, depending on their medical insurance.  My understanding is that patient understood and consented to proceed.     _________________________________________________________________________________   History of Present Illness:  I, Toni Amend, am serving as Education administrator for [employer].  On October 13, 2018, had her knee replacement, and notes "that was my focus for quite a while."   - Knee Replacement Says it went fabulously.  She had a tough couple of weeks at first, had an infection and a Baker's cyst, but notes once she got through that, she was walking with just a cane down the hill and back.  Says "physical therapy, everything went fantastic, and it's doing really well."  She was supposed to have the other knee done in December, but wanted to wait a bit to make sure her first knee was really strong first.  Had the surgery through [facility] and went through [facility].  - Mood Management Patient notes that she did increase her dose of Lexapro to 20 mg during acute stress preceding her surgery, but currently she's back down to 10 mg of Lexapro.  She remained on the 20 for a while, but states she gets "sweaty" on the higher  dose.    - Sleep Difficulties States she had to stop taking the trazodone once she had her knee surgery.  Says she could not sleep after her surgery since she was on prednisone and ativan.  "After things settled down, I cleared the plate a little bit, and reintroduced [the trazodone]."  She is not taking trazodone anymore.  Says when she tried it a month ago, she was getting dizzy and having long-term effects, so she decided not to take it anymore.  To sleep, she is taking Lunesta every night.  Says she has been on this medication as-needed for quite a while.  She takes melatonin as well.  - Osteoarthritis and Pseudogout, Managed by Specialist She started on Plaquenil thirty days ago because she was having flare-ups.  Now she is taking mitigare and Plaquenil, and seeing an improvement in her symptoms on current treatment.     GAD 7 : Generalized Anxiety Score 07/19/2018 05/29/2016  Nervous, Anxious, on Edge 0 0  Control/stop worrying 0 1  Worry too much - different things 0 0  Trouble relaxing 0 0  Restless 0 0  Easily annoyed or irritable 0 0  Afraid - awful might happen 0 0  Total GAD 7 Score 0 1  Anxiety Difficulty Not difficult at all Somewhat difficult    Depression screen [facility] 2/9 04/19/2019 07/19/2018 04/22/2018 06/16/2017 06/10/2017  Decreased Interest 0 0 0 0 0  Down, Depressed, Hopeless 0 0 0 0 0  PHQ - 2 Score 0 0 0  0 0  Altered sleeping 3 0 3 3 -  Tired, decreased energy 1 0 3 3 -  Change in appetite 0 0 0 0 -  Feeling bad or failure about yourself  0 0 0 0 -  Trouble concentrating 0 0 0 0 -  Moving slowly or fidgety/restless 0 0 0 0 -  Suicidal thoughts 0 0 0 0 -  PHQ-9 Score 4 0 6 6 -  Difficult doing work/chores Not difficult at all Not difficult at all Very difficult Not difficult at all -      Impression and Recommendations:     1. Adjustment disorder with mixed anxiety and depressed mood   2. Sleep difficulties   3. Vitamin D deficiency   4. Pseudogout  involving multiple joints   5. Generalized OA   6. Hypothyroidism, unspecified type   7. Prediabetes       - Last OV 10/06/2018 and patient told to follow up in 30 days, but was lost to follow-up.   Hypothyroidism - For management of her hypothyroidism, patient requests referral to Dr. Elvera LennoxGherghe of endocrinology today.  [facility], [facility] by Dr. Leslie DalesAltheimer, and he is retiring.  - Ambulatory referral provided today.  See orders.  - Will continue to monitor.   Adjustment Disorder w/ Mixed Anxiety & Depressed Mood - Last OV, increased Lexapro to 20 from 10 prior. - Per patient, did increase her dose during acute stress related to knee replacement surgery, but has since decreased dose of Lexapro on her own, back down to 10 mg.  - Stable at this time. - Patient will continue management on 10 mg Lexapro as established.  See med list.  - Last OV, patient was told to take half-tablet of trazodone for anxiety as needed during the day. - Per patient, discontinued use of trazodone entirely.  - Will continue to monitor.   Sleep Difficulties - Per patient, discontinued trazodone entirely after knee replacement surgery.  - Patient continues melatonin and Lunesta as formerly prescribed by historical provider. - Stable at this time on current management. - Patient tolerating treatment plan well without S-E. - Continue management as established.  See med list.  - Will continue to monitor.   Pseudogout involving multiple joints, Generalized OA  - Patient's treatment plan continues to be managed and modified by specialist.  - Patient will continue mangement on mitigare and plaquenil as established.  See med list. - Per patient, tolerating medications well and noticing improvements.  - Patient knows to continue to follow-up with specialist as established.  - Will continue to monitor alongside supervising specialist.   Health Counseling &  Preventative Maintenance - Advised patient to continue working toward exercising to improve overall mental, physical, and emotional health.    - Reviewed the "spokes of the wheel" of mood and wellbeing.  Stressed the importance of ongoing prudent habits, including regular exercise, appropriate sleep hygiene, healthful dietary choices, and prayer/meditation to relax.  - Encouraged patient to engage in daily physical activity as tolerated, especially a formal exercise routine.  Recommended that the patient eventually strive for at least 150 minutes of moderate cardiovascular activity per week according to guidelines established by the [facility].   - Healthy dietary habits encouraged, including low-carb, and high amounts of lean protein in diet.   - Patient should also consume adequate amounts of water.  - Health counseling performed.  All questions answered.   Recommendations - Return late June for CPE  and full fasting lab work same day.      - As part of my medical decision making, I reviewed the following data within the electronic MEDICAL RECORD NUMBER History obtained from pt /family, CMA notes reviewed and incorporated if applicable, Labs reviewed, Radiograph/ tests reviewed if applicable and OV notes from prior OV's with me, as well as other specialists she/he has seen since seeing me last, were all reviewed and used in my medical decision making process today.    - Additionally, when appropriate, discussion had with patient regarding our treatment plan, and their biases/concerns about that plan were used in my medical decision making today.    - The patient agreed with the plan and demonstrated an understanding of the instructions.   No barriers to understanding were identified.     - The patient was advised to call back or seek an in-person evaluation if the symptoms worsen or if the condition fails to improve as anticipated.    Return for CPE and fasting blood work same day in June, and  chronic f/up as scheduled.     Orders Placed This Encounter  Procedures  . Ambulatory referral to Endocrinology    Meds ordered this encounter  Medications  . escitalopram (LEXAPRO) 10 MG tablet    Sig: Take 1 tablet (10 mg total) by mouth at bedtime.    Dispense:  90 tablet    Refill:  1    Needs ov with new provider at office for RF  . Vitamin D, Ergocalciferol, (DRISDOL) 1.25 MG (50000 UNIT) CAPS capsule    Sig: Take 1 capsule (50,000 Units total) by mouth every 7 (seven) days.    Dispense:  12 capsule    Refill:  1    Medications Discontinued During This Encounter  Medication Reason  . Colchicine (MITIGARE) 0.6 MG CAPS Error  . nitrofurantoin (MACRODANTIN) 100 MG capsule Error  . predniSONE (DELTASONE) 5 MG tablet Error  . Vitamin D, Ergocalciferol, (DRISDOL) 1.25 MG (50000 UT) CAPS capsule Reorder  . escitalopram (LEXAPRO) 20 MG tablet Reorder       Time spent on visit including pre-visit chart review and post-visit care was 21 minutes.    Note:  This note was prepared with assistance of Dragon voice recognition software. Occasional wrong-word or sound-a-like substitutions may have occurred due to the inherent limitations of voice recognition software.  The 21st Century Cures Act was signed into law in 2016 which includes the topic of electronic health records.  This provides immediate access to information in MyChart.  This includes consultation notes, operative notes, office notes, lab results and pathology reports.  If you have any questions about what you read please let us know at your next visit or call us at the office.  We are right here with you.  This document serves as a record of services personally performed by Thomasene Lot, DO. It was created on her behalf by Peggye Fothergill, a trained medical scribe. The creation of this record is based on the scribe's personal observations and the provider's statements to them.    The above documentation from  Peggye Fothergill, medical scribe, has been reviewed by Carlye Grippe, D.O.    __________________________________________________________________________________     Patient Care Team    Relationship Specialty Notifications Start End  Thomasene Lot, DO PCP - General Family Medicine  10/09/15   Marinus Maw, MD Consulting Physician Cardiology  10/09/15   Salvatore Marvel, MD Consulting Physician Orthopedic Surgery  10/09/15  Pollyann Savoy, MD Consulting Physician Rheumatology  10/09/15   Sharrell Ku, MD Consulting Physician Gastroenterology  10/09/15   Freddy Finner, MD Consulting Physician Obstetrics and Gynecology  03/13/17   Rene Paci, MD Consulting Physician Urology  03/13/17   Anson Fret, MD Consulting Physician Neurology  04/22/18      -Vitals obtained; medications/ allergies reconciled;  personal medical, social, Sx etc.histories were updated by CMA, reviewed by me and are reflected in chart   Patient Active Problem List   Diagnosis Date Noted  . NAFLD (nonalcoholic fatty liver disease) 09/38/1829  . Elevated LFTs 11/11/2015  . Adjustment disorder with mixed anxiety and depressed mood 11/11/2015  . Pseudogout involving multiple joints 10/14/2015  . HTN (hypertension) 10/14/2015  . Elevated HDL 11/11/2015  . Hypothyroidism 10/14/2015  . Generalized OA 10/14/2015  . 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 Hyperuricemia 04/11/2016  . Primary insomnia 04/11/2016  . Vitamin D deficiency 10/14/2015  . Fatigue 10/14/2015  . Sleep difficulties 10/14/2015  . Elevated LDL cholesterol level 07/19/2018  . Prediabetes 07/19/2018  . Paresthesia 12/09/2017  . Chronic insomnia 11/24/2017  . Insomnia secondary to chronic pain 11/24/2017  . Complaint related to dreams 11/24/2017  . 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 Neuropathy 08/18/2017  . 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 Obesity, Class I, BMI 30-34.9 12/03/2016  . Kidney stones 09/12/2016  . Nausea 09/12/2016  . High risk medication use 07/30/2016  . Primary osteoarthritis of both feet 04/12/2016  . History of kidney stones 04/12/2016  . Primary osteoarthritis of both hands 04/11/2016  . Bilateral primary osteoarthritis of hip 04/11/2016  . Primary osteoarthritis of both knees 04/11/2016  . Spondylosis of lumbar region without myelopathy or radiculopathy 04/11/2016  . Flank pain 04/02/2016  . Sinusitis 04/02/2016  . Dysuria 11/11/2015  . Hormone replacement therapy- per GYN 10/14/2015  . h/o Hiatal hernia 10/14/2015  . Counseling on health promotion and disease prevention 10/14/2015  . Age-related nuclear cataract of both eyes 01/25/2015  . Posterior vitreous detachment of left eye 01/25/2015  . chronic Palpitations 07/19/2013     Current Meds  Medication Sig  . Cholecalciferol (VITAMIN D) 50 MCG (2000 UT) CAPS 2,000 daily OTC in addition to once weekly prescription  . Colchicine (MITIGARE) 0.6 MG CAPS Take 1 capsule by mouth daily.  Marland Kitchen CREAM BASE EX Apply 1 application topically daily. BIEST Transdermal Cream 0.5 mg: Compounded bio-identical Estriol (E3) combined by [facility]  . escitalopram (LEXAPRO) 10 MG tablet Take 1 tablet (10 mg total) by mouth at bedtime.  . famotidine (PEPCID) 40 MG tablet Take 40 mg by mouth daily as needed (for heartburn/indigestion.).   Marland Kitchen Flaxseed, Linseed, (FLAX SEED OIL) 1000 MG CAPS Take 2,000 mg by mouth every evening.  . hydroxychloroquine (PLAQUENIL) 200 MG tablet Take 200mg  by mouth twice daily, Monday-Friday only. None on Saturday or Sunday.  Sunday ibuprofen (ADVIL,MOTRIN) 200 MG tablet Take 400 mg by mouth every 8 (eight) hours as needed (for pain.).  Marland Kitchen levothyroxine (SYNTHROID, LEVOTHROID) 112 MCG tablet Take 112 mcg by mouth daily before breakfast.  . Magnesium 500 MG CAPS Take 1,000 mg by mouth every evening.   . Misc Natural Products (TART CHERRY ADVANCED PO) Take 2,000 mg by mouth daily. 1000 mg per capsule  . nitrofurantoin, macrocrystal-monohydrate, (MACROBID) 100 MG capsule Take by mouth daily. UTI prevention  .  NONFORMULARY OR COMPOUNDED ITEM Take 150 mg by mouth at bedtime. Bio identical Progesterone 150 ([facility])  . Omega-3 Fatty Acids (FISH OIL) 1200 MG CAPS Take 2,400 mg by mouth every evening.  . [DISCONTINUED] escitalopram (LEXAPRO) 20 MG tablet Take 1 tablet (20 mg total) by mouth at bedtime. **PATIENT NEEDS APT FOR ANY FURTHER REFILLS**     Allergies:  Allergies  Allergen Reactions  . Penicillins Hives and Itching    Has patient had a PCN reaction causing immediate rash, facial/tongue/throat swelling, SOB or lightheadedness with hypotension: No Has patient had a PCN reaction causing severe rash involving mucus membranes or skin necrosis: No Has patient had a PCN reaction that required hospitalization: No Has patient had a PCN reaction occurring within the last 10 years: No If all of the above answers are "NO", then may proceed with Cephalosporin use.   . Sulfa Antibiotics Hives and Itching    Bactrim     ROS:  See above HPI for pertinent positives and negatives   Objective:   Height 5\' 6"  (1.676 m), weight 198 lb (89.8 kg).  (if some vitals are omitted, this means that patient was UNABLE to obtain them even though they were asked to get them prior to OV today.  They were asked to call at their earliest convenience with these once obtained. ) General: A & O * 3; sounds in no acute distress; in usual state of health.  Skin: Pt confirms warm and dry extremities and pink fingertips HEENT: Pt confirms lips non-cyanotic Chest: Patient confirms normal chest excursion and movement Respiratory: speaking in full sentences, no conversational dyspnea; patient confirms no use of accessory muscles Psych: insight appears good, mood- appears full

## 2019-04-20 ENCOUNTER — Other Ambulatory Visit: Payer: Self-pay | Admitting: Rheumatology

## 2019-04-20 DIAGNOSIS — Z79899 Other long term (current) drug therapy: Secondary | ICD-10-CM | POA: Diagnosis not present

## 2019-04-20 DIAGNOSIS — H2513 Age-related nuclear cataract, bilateral: Secondary | ICD-10-CM | POA: Diagnosis not present

## 2019-04-20 DIAGNOSIS — H52203 Unspecified astigmatism, bilateral: Secondary | ICD-10-CM | POA: Diagnosis not present

## 2019-04-20 DIAGNOSIS — H524 Presbyopia: Secondary | ICD-10-CM | POA: Diagnosis not present

## 2019-04-22 ENCOUNTER — Other Ambulatory Visit: Payer: Self-pay | Admitting: Family Medicine

## 2019-04-26 ENCOUNTER — Other Ambulatory Visit: Payer: Self-pay | Admitting: Rheumatology

## 2019-04-26 DIAGNOSIS — M1990 Unspecified osteoarthritis, unspecified site: Secondary | ICD-10-CM

## 2019-04-26 NOTE — Telephone Encounter (Signed)
Last Visit: 03/02/2019 Next Visit: 05/27/2019 Labs: 04/13/2019 Glucose is 109. Rest of CMP WNL. Eye exam: 04/20/2019   Per telephone encounter on 04/14/2019, It is okay for her to stay on Plaquenil 1 tablet daily.  If she cannot tolerate Plaquenil 1 tablet daily then she may discontinue it.  Okay to refill per Dr. Corliss Skains.

## 2019-04-27 ENCOUNTER — Ambulatory Visit: Payer: BC Managed Care – PPO | Admitting: Family Medicine

## 2019-05-17 ENCOUNTER — Other Ambulatory Visit: Payer: Self-pay

## 2019-05-18 NOTE — Progress Notes (Deleted)
Office Visit Note  Patient: Marissa Larson             Date of Birth: Jun 04, 1955           MRN: [id number]             PCP: Lorrene Reid, PA-C Referring: Mellody Dance, DO Visit Date: 05/27/2019 Occupation: @GUAROCC @  Subjective:  No chief complaint on file.   History of Present Illness: Marissa Larson is a 64 y.o. female ***   Activities of Daily Living:  Patient reports morning stiffness for *** {minute/hour:19697}.   Patient {ACTIONS;DENIES/REPORTS:21021675::"Denies"} nocturnal pain.  Difficulty dressing/grooming: {ACTIONS;DENIES/REPORTS:21021675::"Denies"} Difficulty climbing stairs: {ACTIONS;DENIES/REPORTS:21021675::"Denies"} Difficulty getting out of chair: {ACTIONS;DENIES/REPORTS:21021675::"Denies"} Difficulty using hands for taps, buttons, cutlery, and/or writing: {ACTIONS;DENIES/REPORTS:21021675::"Denies"}  No Rheumatology ROS completed.   PMFS History:  Patient Active Problem List   Diagnosis Date Noted  . Elevated LDL cholesterol level 07/19/2018  . Prediabetes 07/19/2018  . Paresthesia 12/09/2017  . Chronic insomnia 11/24/2017  . Insomnia secondary to chronic pain 11/24/2017  . Complaint related to dreams 11/24/2017  . 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 Neuropathy 08/18/2017  . 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 Obesity, Class I, BMI 30-34.9 12/03/2016  . Kidney stones 09/12/2016  . Nausea 09/12/2016  . High risk medication use 07/30/2016  . NAFLD (nonalcoholic fatty liver disease) 07/30/2016  . Primary osteoarthritis of both feet 04/12/2016  . History of kidney stones 04/12/2016  . Primary osteoarthritis of both hands 04/11/2016  . Bilateral primary osteoarthritis of hip 04/11/2016  . Primary osteoarthritis of both knees 04/11/2016  . Spondylosis of lumbar region without myelopathy or radiculopathy 04/11/2016  . Hyperuricemia 04/11/2016  .  Primary insomnia 04/11/2016  . Flank pain 04/02/2016  . Sinusitis 04/02/2016  . Elevated HDL 11/11/2015  . Elevated LFTs 11/11/2015  . Adjustment disorder with mixed anxiety and depressed mood 11/11/2015  . Dysuria 11/11/2015  . Hypothyroidism 10/14/2015  . Pseudogout involving multiple joints 10/14/2015  . Vitamin D deficiency 10/14/2015  . Hormone replacement therapy- per GYN 10/14/2015  . HTN (hypertension) 10/14/2015  . Fatigue 10/14/2015  . h/o Hiatal hernia 10/14/2015  . Sleep difficulties 10/14/2015  . Generalized OA 10/14/2015  . Counseling on health promotion and disease prevention 10/14/2015  . 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 Age-related nuclear cataract of both eyes 01/25/2015  . Posterior vitreous detachment of left eye 01/25/2015  . chronic Palpitations 07/19/2013    Past Medical History:  Diagnosis Date  . Arthritis   . Chest pain 06/2013   Left sided  . Chronic leg pain 06/2013  . Complication of anesthesia   . Dysrhythmia   . Fatigue   . GERD (gastroesophageal reflux disease)   . History of kidney stones   . Hypertension   . Hypothyroidism   . Nonproductive cough 06/2013  . Overweight 01/18/2014  . Peripheral neuropathy   . PONV (postoperative nausea and vomiting)   . Sleep difficulties    Change in sleep patterns  . SOB (shortness of breath) 06/2013  . Tachycardia 06/2013  . Thyroid disease    hypo  . UTI (lower urinary tract infection)   . Vesico-ureteric reflux     Family History  Problem Relation Age of Onset  . Migraines Other        Fam Hx   . Heart disease Other        Fam  Hx  . Thyroid disease Other        Fam Hx - She also has thyroid disease  . Diabetes Other        Fam Hx  . Arthritis Other        Fam Hx  . Gallbladder disease Other        Fam Hx  . Cancer Other        Fam Hx - Pancreatic  . Hypertension Other        Fam Hx  . Heart attack Cousin        Multiple cousins with MI's before 65 years of age  . Heart disease  Mother 68  . Hyperlipidemia Mother 48  . Hypertension Mother 42  . Stroke Mother 11  . Heart disease Maternal Grandmother   . Cancer Father 70       pancreatic  . Diabetes Brother 50  . Heart attack Maternal Uncle   . Heart attack Paternal Aunt    Past Surgical History:  Procedure Laterality Date  . ABDOMINAL SURGERY  1994  . CYSTOSCOPY/URETEROSCOPY/HOLMIUM LASER/STENT PLACEMENT Left 09/22/2017   Procedure: CYSTOSCOPY/RETROGRADE/URETEROSCOPY/ BASKET STONE EXTRACTION/STENT PLACEMENT;  Surgeon: Rene Paci, MD;  Location: [facility];  Service: Urology;  Laterality: Left;  ONLY NEEDS 60 MIN  . FOOT SURGERY Right 2009  . KIDNEY SURGERY Left 1993  . KNEE SURGERY     Multiple knee surgeries  . OOPHORECTOMY Left 1994  . 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 Thurston Hole  . small fiber neuropathy biopsy  12/2017  . TONSILLECTOMY    . URETEROSCOPY VIA URETEROSTOMY Left 1994   Social History   Social History Narrative  . Not on file   Immunization History  Administered Date(s) Administered  . Tdap 01/07/2011     Objective: Vital Signs: There were no vitals taken for this visit.   Physical Exam   Musculoskeletal Exam: ***  CDAI Exam: CDAI Score: -- Patient Global: --; Provider Global: -- Swollen: --; Tender: -- Joint Exam 05/27/2019   No joint exam has been documented for this visit   There is currently no information documented on the homunculus. Go to the Rheumatology activity and complete the homunculus joint exam.  Investigation: No additional findings.  Imaging: No results found.  Recent Labs: Lab Results  Component Value Date   WBC 5.5 04/13/2019   HGB 14.5 04/13/2019   PLT 222 04/13/2019   NA 142 04/13/2019   K 4.2 04/13/2019   CL 106 04/13/2019   CO2 26 04/13/2019   GLUCOSE 109 (H) 04/13/2019   BUN 18 04/13/2019   CREATININE 0.62 04/13/2019   BILITOT 0.3 04/13/2019   ALKPHOS 84 06/17/2018   AST 20 04/13/2019   ALT 20 04/13/2019    PROT 6.1 04/13/2019   ALBUMIN 4.3 06/17/2018   CALCIUM 9.6 04/13/2019   GFRAA 113 04/13/2019   QFTBGOLDPLUS NEGATIVE 02/16/2017    Speciality Comments: PLQ eye exam: 04/20/2019 normal. Dr. Burgess Estelle. Follow up in 1 year.  Procedures:  No procedures performed Allergies: Penicillins and Sulfa antibiotics   Assessment / Plan:     Visit Diagnoses: No diagnosis found.  Orders: No orders of the defined types were placed in this encounter.  No orders of the defined types were placed in this encounter.   Face-to-face time spent with patient was *** minutes. Greater than 50% of time was spent in counseling and coordination of care.  Follow-Up Instructions: No follow-ups on file.   Gearldine Bienenstock,  PA-C  Note - This record has been created using [employer].  Chart creation errors have been sought, but may not always  have been located. Such creation errors do not reflect on  the standard of medical care.

## 2019-05-18 NOTE — Progress Notes (Signed)
Patient ID: Marissa Larson, female   DOB: Aug 13, 1955, 64 y.o.   MRN: [id number]   This visit occurred during the SARS-CoV-2 public health emergency.  Safety protocols were in place, including screening questions prior to the visit, additional usage of staff PPE, and extensive cleaning of exam room while observing appropriate contact time as indicated for disinfecting solutions.   HPI  Marissa Larson is a 64 y.o.-year-old female, referred by her PCP, Dr. Sharee Holster, for management of hypothyroidism and prediabetes.   She previously saw Dr. Leslie Dales from 2017 2019.  I reviewed his notes.  Pt. has been dx with hypothyroidism in her 9-21 y/o, when she presented with several symptoms including fatigue >> on Synthroid DAW (brand name for last 1-2 years) 112 mcg.  She takes the thyroid hormone: - fasting - with water - coffee and creamer >30 min later - separated by 1-2h from b'fast  - no calcium, iron, PPIs - on Corticare B  (no Biotin)-last dose yesterday am - >4h after LT4 - on Pepcid prn - not lately  I reviewed pt's thyroid tests: Lab Results  Component Value Date   TSH 1.700 06/17/2018   TSH 0.486 06/10/2017   TSH 0.61 02/20/2017   TSH 0.409 (L) 12/08/2016   TSH 1.10 10/10/2015   TSH 0.75 04/27/2015   FREET4 1.37 06/17/2018   FREET4 1.66 06/10/2017   FREET4 1.4 10/10/2015   FREET4 1.55 04/27/2015   T3FREE 3.1 06/17/2018   T3FREE 2.9 06/10/2017   T3FREE 3.2 10/10/2015   Per Dr. Berline Lopes records: She was followed by an endocrinologist in [place] in about 1982-2012 prior to relocating here. TSH 1.680 on 09/29/13 on levothyroxine 112 mcg daily. TSH 1.450 on 03/23/14 on levothyroxine 112 mcg daily. TSH 1.450 on 07/21/14 on levothyroxine 112 mcg daily. Thyroid peroxidase (anti-TPO) antibody negative and thyroglobulin antibody negative in 3/17. FT4 1.59 (0.82-1.77), TSH 0.338 (0.450-4.500) in 3/17 on levothyroxine 112 mcg daily. FT4 1.55 (0.82-1.77), TSH 0.753 (0.450-4.500) in 4/17  on levothyroxine 112 mcg daily. FT4 1.45 (0.82-1.77), TSH 0.846 (0.450-4.500) in 10/17 on levothyroxine 112 mcg daily. FT4 1.0 (0.6-1.4), TSH 1.492 (0.450-5.330) in 6/18 on levothyroxine 112 mcg daily. FT4 1.1 (0.6-1.4), TSH 0.605 (0.450-5.330) in 2/19 on levothyroxine 112 mcg daily. FT4 0.9 (0.6-1.4), TSH 0.486 (0.450-5.330) in 10/19 on levothyroxine 112 mcg daily.  Pt describes: - chronic fatigue, poor sleep - weight gain -No cold intolerance -No depression, + anxiety -No constipation, + diarrhea -No dry skin -No hair loss  Pt denies feeling nodules in neck, hoarseness, dysphagia/odynophagia, SOB with lying down.  She has + FH of thyroid disorders in: Mother (thyroidectomy for goiter) and sister, M aunt (thyroidectomy for goiter). No FH of thyroid cancer.  No h/o radiation tx to head or neck. No recent use of iodine supplements. No steroid inj. PO Prednisone course 10/2019.  Pt. also has a history of mild prediabetes:  She had 2 slightly elevated HbA1c: Lab Results  Component Value Date   HGBA1C 5.7 (H) 06/17/2018   HGBA1C 5.7 (H) 06/10/2017   HGBA1C 5.5 12/08/2016   HGBA1C 5.5 10/10/2015   She is not on any medication for the prediabetes.  She is not checking blood sugars.  No CKD: Lab Results  Component Value Date   BUN 18 04/13/2019   BUN 14 03/02/2019   Lab Results  Component Value Date   CREATININE 0.62 04/13/2019   CREATININE 0.53 03/02/2019   + HL: Lab Results  Component Value Date   CHOL 227 (H) 01/04/2019  HDL 92 01/04/2019   LDLCALC 112 (H) 01/04/2019   TRIG 136 01/04/2019   CHOLHDL 2.5 01/04/2019  On omega-3 fatty acids and flaxseed oil.  Last eye exam: 04/2019 - on Plaquenil.  She has family history of diabetes in brother.  She has fatty liver - lost weight (20 lbs) 2 years ago, but gained some back.  She sees Dr. Corliss Skains for OA and pseudogout >> on Plaquenil and Colchicine.  She also has a history of kidney stones.    ROS: Constitutional: + See HPI Eyes: no blurry vision, no xerophthalmia ENT: no sore throat, + see HPI Cardiovascular: no CP/SOB/+ palpitations/ no leg swelling Respiratory: no cough/SOB Gastrointestinal: + N/V/D/ no C Musculoskeletal: + Both: Muscle/joint aches Skin: no rashes Neurological: no tremors/numbness/tingling/dizziness Psychiatric: no depression/+ anxiety  Past Medical History:  Diagnosis Date  . Arthritis   . Chest pain 06/2013   Left sided  . Chronic leg pain 06/2013  . Complication of anesthesia   . Dysrhythmia   . Fatigue   . GERD (gastroesophageal reflux disease)   . History of kidney stones   . Hypertension   . Hypothyroidism   . Nonproductive cough 06/2013  . Overweight 01/18/2014  . Peripheral neuropathy   . PONV (postoperative nausea and vomiting)   . Sleep difficulties    Change in sleep patterns  . SOB (shortness of breath) 06/2013  . Tachycardia 06/2013  . Thyroid disease    hypo  . UTI (lower urinary tract infection)   . Vesico-ureteric reflux    Past Surgical History:  Procedure Laterality Date  . ABDOMINAL SURGERY  1994  . CYSTOSCOPY/URETEROSCOPY/HOLMIUM LASER/STENT PLACEMENT Left 09/22/2017   Procedure: CYSTOSCOPY/RETROGRADE/URETEROSCOPY/ BASKET STONE EXTRACTION/STENT PLACEMENT;  Surgeon: Rene Paci, MD;  Location: [facility];  Service: Urology;  Laterality: Left;  ONLY NEEDS 60 MIN  . FOOT SURGERY Right 2009  . KIDNEY SURGERY Left 1993  . KNEE SURGERY     Multiple knee surgeries  . OOPHORECTOMY Left 1994  . 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 Thurston Hole  . small fiber neuropathy biopsy  12/2017  . TONSILLECTOMY    . URETEROSCOPY VIA URETEROSTOMY Left 1994   Social History   Socioeconomic History  . Marital status: Married    Spouse name: Not on file  . Number of children: 0  . Years of education: Not on file  . Highest education level: Not on file  Occupational History  . Occupation: Orthoptist   Tobacco Use  . Smoking status: Never Smoker  . Smokeless tobacco: Never Used  Substance and Sexual Activity  . Alcohol use: Yes    Alcohol/week: 1.0 - 2.0 standard drinks    Types: 1 - 2 Glasses of wine per week    Comment: Twice a week  . Drug use: No  . Sexual activity: Yes    Birth control/protection: None  Other Topics Concern  . Not on file  Social History Narrative  . Not on file   Social Determinants of Health   Financial Resource Strain:   . Difficulty of Paying Living Expenses:   Food Insecurity:   . Worried About Programme researcher, broadcasting/film/video in the Last Year:   . Barista in the Last Year:   Transportation Needs:   . Freight forwarder (Medical):   Marland Kitchen Lack of Transportation (Non-Medical):   Physical Activity:   . Days of Exercise per Week:   . Minutes of Exercise per Session:  Stress:   . Feeling of Stress :   Social Connections:   . Frequency of Communication with Friends and Family:   . Frequency of Social Gatherings with Friends and Family:   . Attends Religious Services:   . Active Member of Clubs or Organizations:   . Attends Banker Meetings:   Marland Kitchen Marital Status:   Intimate Partner Violence:   . Fear of Current or Ex-Partner:   . Emotionally Abused:   Marland Kitchen Physically Abused:   . Sexually Abused:    Current Outpatient Medications on File Prior to Visit  Medication Sig Dispense Refill  . Cholecalciferol (VITAMIN D) 50 MCG (2000 UT) CAPS 2,000 daily OTC in addition to once weekly prescription 30 capsule   . Colchicine (MITIGARE) 0.6 MG CAPS Take 1 capsule by mouth daily. 90 capsule 0  . CREAM BASE EX Apply 1 application topically daily. BIEST Transdermal Cream 0.5 mg: Compounded bio-identical Estriol (E3) combined by [facility]    . escitalopram (LEXAPRO) 10 MG tablet Take 1 tablet (10 mg total) by mouth at bedtime. 90 tablet 1  . eszopiclone (LUNESTA) 1 MG TABS tablet Take 1 mg by mouth at bedtime as needed.    . famotidine  (PEPCID) 40 MG tablet Take 40 mg by mouth daily as needed (for heartburn/indigestion.).   2  . Flaxseed, Linseed, (FLAX SEED OIL) 1000 MG CAPS Take 2,000 mg by mouth every evening.    . hydroxychloroquine (PLAQUENIL) 200 MG tablet Take 1 tablet (200 mg total) by mouth daily. 90 tablet 0  . ibuprofen (ADVIL,MOTRIN) 200 MG tablet Take 400 mg by mouth every 8 (eight) hours as needed (for pain.).    Marland Kitchen levothyroxine (SYNTHROID, LEVOTHROID) 112 MCG tablet Take 112 mcg by mouth daily before breakfast.    . Magnesium 500 MG CAPS Take 1,000 mg by mouth every evening.    . Misc Natural Products (TART CHERRY ADVANCED PO) Take 2,000 mg by mouth daily. 1000 mg per capsule    . nitrofurantoin, macrocrystal-monohydrate, (MACROBID) 100 MG capsule Take by mouth daily. UTI prevention    . NONFORMULARY OR COMPOUNDED ITEM Take 150 mg by mouth at bedtime. Bio identical Progesterone 150 ([facility])    . Omega-3 Fatty Acids (FISH OIL) 1200 MG CAPS Take 2,400 mg by mouth every evening.    . Vitamin D, Ergocalciferol, (DRISDOL) 1.25 MG (50000 UNIT) CAPS capsule Take 1 capsule (50,000 Units total) by mouth every 7 (seven) days. 12 capsule 1   No current facility-administered medications on file prior to visit.   Allergies  Allergen Reactions  . Penicillins Hives and Itching    Has patient had a PCN reaction causing immediate rash, facial/tongue/throat swelling, SOB or lightheadedness with hypotension: No Has patient had a PCN reaction causing severe rash involving mucus membranes or skin necrosis: No Has patient had a PCN reaction that required hospitalization: No Has patient had a PCN reaction occurring within the last 10 years: No If all of the above answers are "NO", then may proceed with Cephalosporin use.   . Sulfa Antibiotics Hives and Itching    Bactrim   Family History  Problem Relation Age of Onset  . Migraines Other        Fam Hx   . Heart disease Other        Fam Hx  . Thyroid disease  Other        Fam Hx - She also has thyroid disease  . Diabetes Other  Fam Hx  . Arthritis Other        Fam Hx  . Gallbladder disease Other        Fam Hx  . Cancer Other        Fam Hx - Pancreatic  . Hypertension Other        Fam Hx  . Heart attack Cousin        Multiple cousins with MI's before 36 years of age  . Heart disease Mother 22  . Hyperlipidemia Mother 67  . Hypertension Mother 55  . Stroke Mother 76  . Heart disease Maternal Grandmother   . Cancer Father 62       pancreatic  . Diabetes Brother 1  . Heart attack Maternal Uncle   . Heart attack Paternal Aunt    PE: BP (!) 180/130   Pulse 85   Ht 5\' 6"  (1.676 m)   Wt 198 lb (89.8 kg)   SpO2 96%   BMI 31.96 kg/m  Wt Readings from Last 3 Encounters:  05/19/19 198 lb (89.8 kg)  04/19/19 198 lb (89.8 kg)  03/02/19 201 lb (91.2 kg)   Constitutional: overweight, in NAD Eyes: PERRLA, EOMI, no exophthalmos ENT: moist mucous membranes, no thyromegaly, no cervical lymphadenopathy Cardiovascular: RRR, No MRG Respiratory: CTA B Gastrointestinal: abdomen soft, NT, ND, BS+ Musculoskeletal: no deformities, strength intact in all 4 Skin: moist, warm, no rashes Neurological: no tremor with outstretched hands, DTR normal in all 4  ASSESSMENT: 1.  Acquired hypothyroidism -No clear evidence of Hashimoto's thyroiditis  2.  Mild prediabetes  PLAN:  1. Patient with long-standing hypothyroidism, on Synthroid d.a.w. therapy, 112 mcg daily. - No recent labs available for review: TSH was normal in 06/2018 - she appears euthyroid.  - she does not appear to have a goiter, thyroid nodules, or neck compression symptoms - We discussed about correct intake of levothyroxine, fasting, with water, separated by at least 30 minutes from breakfast, and separated by more than 4 hours from calcium, iron, multivitamins, acid reflux medications (PPIs). - will check thyroid tests today: TSH, free T4 - If labs today are abnormal, she  will need to return in ~6 weeks for repeat labs - Otherwise, I will see her back in 6 months  2.  Mild prediabetes -Patient had 2 HbA1c levels at 5.7%, which is low in the prediabetic range -At today's visit, HbA1c is 5.5% (better) -Discussed that no medication is needed, but continue to work on improving her diet -She would like to hold off with a referral to nutrition for now -She also needs to improve exercise -No need to start checking blood sugars for now -I will recheck her HbA1c when she returns to the clinic  Needs refills  - [facility].  Component     Latest Ref Rng & Units 05/19/2019  Hemoglobin A1C     4.0 - 5.6 % 5.5  T4,Free(Direct)     0.60 - 1.60 ng/dL 1.16  TSH     0.35 - 4.50 uIU/mL 1.17  Excellent thyroid tests.  Philemon Kingdom, MD PhD [facility]

## 2019-05-19 ENCOUNTER — Encounter: Payer: Self-pay | Admitting: Internal Medicine

## 2019-05-19 ENCOUNTER — Other Ambulatory Visit: Payer: Self-pay

## 2019-05-19 ENCOUNTER — Other Ambulatory Visit: Payer: Self-pay | Admitting: *Deleted

## 2019-05-19 ENCOUNTER — Ambulatory Visit (INDEPENDENT_AMBULATORY_CARE_PROVIDER_SITE_OTHER): Payer: BC Managed Care – PPO | Admitting: Internal Medicine

## 2019-05-19 VITALS — BP 160/120 | HR 85 | Ht 66.0 in | Wt 198.0 lb

## 2019-05-19 DIAGNOSIS — E039 Hypothyroidism, unspecified: Secondary | ICD-10-CM | POA: Diagnosis not present

## 2019-05-19 DIAGNOSIS — Z79899 Other long term (current) drug therapy: Secondary | ICD-10-CM

## 2019-05-19 DIAGNOSIS — R7303 Prediabetes: Secondary | ICD-10-CM | POA: Diagnosis not present

## 2019-05-19 LAB — TSH: TSH: 1.17 u[IU]/mL (ref 0.35–4.50)

## 2019-05-19 LAB — POCT GLYCOSYLATED HEMOGLOBIN (HGB A1C): Hemoglobin A1C: 5.5 % (ref 4.0–5.6)

## 2019-05-19 LAB — T4, FREE: Free T4: 1.16 ng/dL (ref 0.60–1.60)

## 2019-05-19 MED ORDER — SYNTHROID 112 MCG PO TABS: 112.0000 ug | ORAL_TABLET | Freq: Every day | ORAL | 3 refills | Status: DC

## 2019-05-19 NOTE — Patient Instructions (Signed)
Please stop at the lab.  Please continue Synthroid 112 mcg daily.  Take the thyroid hormone every day, with water, at least 30 minutes before breakfast, separated by at least 4 hours from: - acid reflux medications - calcium - iron - multivitamins  Please come back for a follow-up appointment in 6 months.  Please consider the following ways to cut down carbs and fat and increase fiber and micronutrients in your diet: - substitute whole grain for white bread or pasta - substitute brown rice for white rice - substitute 90-calorie flat bread pieces for slices of bread when possible - substitute sweet potatoes or yams for white potatoes - substitute humus for margarine - substitute tofu for cheese when possible - substitute almond or rice milk for regular milk (would not drink soy milk daily due to concern for soy estrogen influence on breast cancer risk) - substitute dark chocolate for other sweets when possible - substitute water - can add lemon or orange slices for taste - for diet sodas (artificial sweeteners will trick your body that you can eat sweets without getting calories and will lead you to overeating and weight gain in the long run) - do not skip breakfast or other meals (this will slow down the metabolism and will result in more weight gain over time)  - can try smoothies made from fruit and almond/rice milk in am instead of regular breakfast - can also try old-fashioned (not instant) oatmeal made with almond/rice milk in am - order the dressing on the side when eating salad at a restaurant (pour less than half of the dressing on the salad) - eat as little meat as possible - can try juicing, but should not forget that juicing will get rid of the fiber, so would alternate with eating raw veg./fruits or drinking smoothies - use as little oil as possible, even when using olive oil - can dress a salad with a mix of balsamic vinegar and lemon juice, for e.g. - use agave nectar,  stevia sugar, or regular sugar rather than artificial sweateners - steam or broil/roast veggies  - snack on veggies/fruit/nuts (unsalted, preferably) when possible, rather than processed foods - reduce or eliminate aspartame in diet (it is in diet sodas, chewing gum, etc) Read the labels!  Try to read Dr. Katherina Right book: "Program for Reversing Diabetes" for other ideas for healthy eating.

## 2019-05-20 ENCOUNTER — Telehealth: Payer: Self-pay | Admitting: Internal Medicine

## 2019-05-20 ENCOUNTER — Telehealth: Payer: Self-pay | Admitting: *Deleted

## 2019-05-20 LAB — COMPLETE METABOLIC PANEL WITH GFR
AG Ratio: 2 (calc) (ref 1.0–2.5)
ALT: 23 U/L (ref 6–29)
AST: 24 U/L (ref 10–35)
Albumin: 4.5 g/dL (ref 3.6–5.1)
Alkaline phosphatase (APISO): 79 U/L (ref 37–153)
BUN: 17 mg/dL (ref 7–25)
CO2: 28 mmol/L (ref 20–32)
Calcium: 9.4 mg/dL (ref 8.6–10.4)
Chloride: 104 mmol/L (ref 98–110)
Creat: 0.55 mg/dL (ref 0.50–0.99)
GFR, Est African American: 117 mL/min/{1.73_m2} (ref >=60)
GFR, Est Non African American: 101 mL/min/{1.73_m2} (ref >=60)
Globulin: 2.3 g/dL (calc) (ref 1.9–3.7)
Glucose, Bld: 108 mg/dL — ABNORMAL HIGH (ref 65–99)
Potassium: 4.2 mmol/L (ref 3.5–5.3)
Sodium: 140 mmol/L (ref 135–146)
Total Bilirubin: 0.4 mg/dL (ref 0.2–1.2)
Total Protein: 6.8 g/dL (ref 6.1–8.1)

## 2019-05-20 LAB — CBC WITH DIFFERENTIAL/PLATELET
Absolute Monocytes: 459 cells/uL (ref 200–950)
Basophils Absolute: 62 cells/uL (ref 0–200)
Basophils Relative: 1 %
Eosinophils Absolute: 87 cells/uL (ref 15–500)
Eosinophils Relative: 1.4 %
HCT: 43.3 % (ref 35.0–45.0)
Hemoglobin: 14.7 g/dL (ref 11.7–15.5)
Lymphs Abs: 1916 cells/uL (ref 850–3900)
MCH: 31.3 pg (ref 27.0–33.0)
MCHC: 33.9 g/dL (ref 32.0–36.0)
MCV: 92.1 fL (ref 80.0–100.0)
MPV: 10.4 fL (ref 7.5–12.5)
Monocytes Relative: 7.4 %
Neutro Abs: 3677 cells/uL (ref 1500–7800)
Neutrophils Relative %: 59.3 %
Platelets: 248 10*3/uL (ref 140–400)
RBC: 4.7 10*6/uL (ref 3.80–5.10)
RDW: 12.3 % (ref 11.0–15.0)
Total Lymphocyte: 30.9 %
WBC: 6.2 10*3/uL (ref 3.8–10.8)

## 2019-05-20 NOTE — Telephone Encounter (Signed)
Patient states her blood pressure this afternoon was 187/94. Patient states she had a headache and dizziness and nausea. Patient states she saw another yesterday doctor with a blood pressure reading of 180/130. Patient states she is on PLQ. Wants to know if this will effect her blood pressure.

## 2019-05-20 NOTE — Telephone Encounter (Signed)
Patient advised per Dr. Corliss Skains the [facility] will not cause elevated blood pressure. Patient advised per Dr.Deveshwar she should follow up with urgent care for elevated blood pressure as she can not see PCP today.

## 2019-05-20 NOTE — Telephone Encounter (Signed)
Pt c/o BP issue: STAT if pt c/o blurred vision, one-sided weakness or slurred speech  1. What are your last 5 BP readings?  180/130 160/80 187/94  2. Are you having any other symptoms (ex. Dizziness, headache, blurred vision, passed out)? Nausea, headache, dizziness  3. What is your BP issue? Pt went to another doctor yesterday and her BP was high. She has never had this happen before and wanted to know what to do

## 2019-05-20 NOTE — Telephone Encounter (Signed)
I spoke to the patient who is calling because of elevated BP and symptoms of nausea, headache and dizziness.  I told her to reach out to her PCP for advisement, seeing that she is not established with General Cardiology, but has been followed by Dr Ladona Ridgel.  She will call with f/u.

## 2019-05-20 NOTE — Progress Notes (Signed)
CBC and CMP normal

## 2019-05-23 ENCOUNTER — Telehealth: Payer: Self-pay | Admitting: Physician Assistant

## 2019-05-23 NOTE — Telephone Encounter (Signed)
Patient called says she went to a diff provider appt last week & they were concerned that her BP was elevated as high as 180/130 , says before they let her leave it had come down to 160/155 but they advised her to f/u with her PCP as soon as possible scheduled pt for provider's 1st available Wed 05/25/19--- also forwarding message to med asst to contact pt w/ any advice (if she needs to proceed to nearest UC or ED for trmt of HTN symptoms.  --pt's ph# [phone]  --glh

## 2019-05-24 NOTE — Telephone Encounter (Signed)
Called patient and left message for her to call back. AS, CMA

## 2019-05-24 NOTE — Telephone Encounter (Signed)
Patient called back stating she has a dull headache today by denies the following sx: SOB, chest pain or tightness, dizziness, jaw or neck pain, L arm pain. Patient is made aware that if she starts to have any of these sx to call 911 or go to the ER. AS, CMA

## 2019-05-25 ENCOUNTER — Other Ambulatory Visit: Payer: Self-pay

## 2019-05-25 ENCOUNTER — Ambulatory Visit (INDEPENDENT_AMBULATORY_CARE_PROVIDER_SITE_OTHER): Payer: BC Managed Care – PPO | Admitting: Physician Assistant

## 2019-05-25 ENCOUNTER — Encounter: Payer: Self-pay | Admitting: Physician Assistant

## 2019-05-25 VITALS — BP 150/83 | HR 71 | Temp 97.9°F | Ht 66.0 in | Wt 197.4 lb

## 2019-05-25 DIAGNOSIS — I10 Essential (primary) hypertension: Secondary | ICD-10-CM

## 2019-05-25 MED ORDER — HYDROCHLOROTHIAZIDE 12.5 MG PO TABS: 12.5000 mg | ORAL_TABLET | Freq: Every day | ORAL | 1 refills | Status: DC

## 2019-05-25 NOTE — Patient Instructions (Signed)
Hypertension, Adult High blood pressure (hypertension) is when the force of blood pumping through the arteries is too strong. The arteries are the blood vessels that carry blood from the heart throughout the body. Hypertension forces the heart to work harder to pump blood and may cause arteries to become narrow or stiff. Untreated or uncontrolled hypertension can cause a heart attack, heart failure, a stroke, kidney disease, and other problems. A blood pressure reading consists of a higher number over a lower number. Ideally, your blood pressure should be below 120/80. The first ("top") number is called the systolic pressure. It is a measure of the pressure in your arteries as your heart beats. The second ("bottom") number is called the diastolic pressure. It is a measure of the pressure in your arteries as the heart relaxes. What are the causes? The exact cause of this condition is not known. There are some conditions that result in or are related to high blood pressure. What increases the risk? Some risk factors for high blood pressure are under your control. The following factors may make you more likely to develop this condition:  Smoking.  Having type 2 diabetes mellitus, high cholesterol, or both.  Not getting enough exercise or physical activity.  Being overweight.  Having too much fat, sugar, calories, or salt (sodium) in your diet.  Drinking too much alcohol. Some risk factors for high blood pressure may be difficult or impossible to change. Some of these factors include:  Having chronic kidney disease.  Having a family history of high blood pressure.  Age. Risk increases with age.  Race. You may be at higher risk if you are African American.  Gender. Men are at higher risk than women before age 45. After age 65, women are at higher risk than men.  Having obstructive sleep apnea.  Stress. What are the signs or symptoms? High blood pressure may not cause symptoms. Very high  blood pressure (hypertensive crisis) may cause:  Headache.  Anxiety.  Shortness of breath.  Nosebleed.  Nausea and vomiting.  Vision changes.  Severe chest pain.  Seizures. How is this diagnosed? This condition is diagnosed by measuring your blood pressure while you are seated, with your arm resting on a flat surface, your legs uncrossed, and your feet flat on the floor. The cuff of the blood pressure monitor will be placed directly against the skin of your upper arm at the level of your heart. It should be measured at least twice using the same arm. Certain conditions can cause a difference in blood pressure between your right and left arms. Certain factors can cause blood pressure readings to be lower or higher than normal for a short period of time:  When your blood pressure is higher when you are in a health care provider's office than when you are at home, this is called white coat hypertension. Most people with this condition do not need medicines.  When your blood pressure is higher at home than when you are in a health care provider's office, this is called masked hypertension. Most people with this condition may need medicines to control blood pressure. If you have a high blood pressure reading during one visit or you have normal blood pressure with other risk factors, you may be asked to:  Return on a different day to have your blood pressure checked again.  Monitor your blood pressure at home for 1 week or longer. If you are diagnosed with hypertension, you may have other blood or   imaging tests to help your health care provider understand your overall risk for other conditions. How is this treated? This condition is treated by making healthy lifestyle changes, such as eating healthy foods, exercising more, and reducing your alcohol intake. Your health care provider may prescribe medicine if lifestyle changes are not enough to get your blood pressure under control, and  if:  Your systolic blood pressure is above 130.  Your diastolic blood pressure is above 80. Your personal target blood pressure may vary depending on your medical conditions, your age, and other factors. Follow these instructions at home: Eating and drinking   Eat a diet that is high in fiber and potassium, and low in sodium, added sugar, and fat. An example eating plan is called the DASH (Dietary Approaches to Stop Hypertension) diet. To eat this way: ? Eat plenty of fresh fruits and vegetables. Try to fill one half of your plate at each meal with fruits and vegetables. ? Eat whole grains, such as whole-wheat pasta, brown rice, or whole-grain bread. Fill about one fourth of your plate with whole grains. ? Eat or drink low-fat dairy products, such as skim milk or low-fat yogurt. ? Avoid fatty cuts of meat, processed or cured meats, and poultry with skin. Fill about one fourth of your plate with lean proteins, such as fish, chicken without skin, beans, eggs, or tofu. ? Avoid pre-made and processed foods. These tend to be higher in sodium, added sugar, and fat.  Reduce your daily sodium intake. Most people with hypertension should eat less than 1,500 mg of sodium a day.  Do not drink alcohol if: ? Your health care provider tells you not to drink. ? You are pregnant, may be pregnant, or are planning to become pregnant.  If you drink alcohol: ? Limit how much you use to:  0-1 drink a day for women.  0-2 drinks a day for men. ? Be aware of how much alcohol is in your drink. In the [place] one drink equals one 12 oz bottle of beer (355 mL), one 5 oz glass of wine (148 mL), or one 1 oz glass of hard liquor (44 mL). Lifestyle   Work with your health care provider to maintain a healthy body weight or to lose weight. Ask what an ideal weight is for you.  Get at least 30 minutes of exercise most days of the week. Activities may include walking, swimming, or biking.  Include exercise to  strengthen your muscles (resistance exercise), such as Pilates or lifting weights, as part of your weekly exercise routine. Try to do these types of exercises for 30 minutes at least 3 days a week.  Do not use any products that contain nicotine or tobacco, such as cigarettes, e-cigarettes, and chewing tobacco. If you need help quitting, ask your health care provider.  Monitor your blood pressure at home as told by your health care provider.  Keep all follow-up visits as told by your health care provider. This is important. Medicines  Take over-the-counter and prescription medicines only as told by your health care provider. Follow directions carefully. Blood pressure medicines must be taken as prescribed.  Do not skip doses of blood pressure medicine. Doing this puts you at risk for problems and can make the medicine less effective.  Ask your health care provider about side effects or reactions to medicines that you should watch for. Contact a health care provider if you:  Think you are having a reaction to a medicine you   are taking.  Have headaches that keep coming back (recurring).  Feel dizzy.  Have swelling in your ankles.  Have trouble with your vision. Get help right away if you:  Develop a severe headache or confusion.  Have unusual weakness or numbness.  Feel faint.  Have severe pain in your chest or abdomen.  Vomit repeatedly.  Have trouble breathing. Summary  Hypertension is when the force of blood pumping through your arteries is too strong. If this condition is not controlled, it may put you at risk for serious complications.  Your personal target blood pressure may vary depending on your medical conditions, your age, and other factors. For most people, a normal blood pressure is less than 120/80.  Hypertension is treated with lifestyle changes, medicines, or a combination of both. Lifestyle changes include losing weight, eating a healthy, low-sodium diet,  exercising more, and limiting alcohol. This information is not intended to replace advice given to you by your health care provider. Make sure you discuss any questions you have with your health care provider. Document Revised: 09/02/2017 Document Reviewed: 09/02/2017 [employer] Patient Education  2020 [employer] DASH Eating Plan DASH stands for "Dietary Approaches to Stop Hypertension." The DASH eating plan is a healthy eating plan that has been shown to reduce high blood pressure (hypertension). It may also reduce your risk for type 2 diabetes, heart disease, and stroke. The DASH eating plan may also help with weight loss. What are tips for following this plan?  General guidelines  Avoid eating more than 2,300 mg (milligrams) of salt (sodium) a day. If you have hypertension, you may need to reduce your sodium intake to 1,500 mg a day.  Limit alcohol intake to no more than 1 drink a day for nonpregnant women and 2 drinks a day for men. One drink equals 12 oz of beer, 5 oz of wine, or 1 oz of hard liquor.  Work with your health care provider to maintain a healthy body weight or to lose weight. Ask what an ideal weight is for you.  Get at least 30 minutes of exercise that causes your heart to beat faster (aerobic exercise) most days of the week. Activities may include walking, swimming, or biking.  Work with your health care provider or diet and nutrition specialist (dietitian) to adjust your eating plan to your individual calorie needs. Reading food labels   Check food labels for the amount of sodium per serving. Choose foods with less than 5 percent of the Daily Value of sodium. Generally, foods with less than 300 mg of sodium per serving fit into this eating plan.  To find whole grains, look for the word "whole" as the first word in the ingredient list. Shopping  Buy products labeled as "low-sodium" or "no salt added."  Buy fresh foods. Avoid canned foods and premade or frozen  meals. Cooking  Avoid adding salt when cooking. Use salt-free seasonings or herbs instead of table salt or sea salt. Check with your health care provider or pharmacist before using salt substitutes.  Do not fry foods. Cook foods using healthy methods such as baking, boiling, grilling, and broiling instead.  Cook with heart-healthy oils, such as olive, canola, soybean, or sunflower oil. Meal planning  Eat a balanced diet that includes: ? 5 or more servings of fruits and vegetables each day. At each meal, try to fill half of your plate with fruits and vegetables. ? Up to 6-8 servings of whole grains each day. ? Less than 6   oz of lean meat, poultry, or fish each day. A 3-oz serving of meat is about the same size as a deck of cards. One egg equals 1 oz. ? 2 servings of low-fat dairy each day. ? A serving of nuts, seeds, or beans 5 times each week. ? Heart-healthy fats. Healthy fats called Omega-3 fatty acids are found in foods such as flaxseeds and coldwater fish, like sardines, salmon, and mackerel.  Limit how much you eat of the following: ? Canned or prepackaged foods. ? Food that is high in trans fat, such as fried foods. ? Food that is high in saturated fat, such as fatty meat. ? Sweets, desserts, sugary drinks, and other foods with added sugar. ? Full-fat dairy products.  Do not salt foods before eating.  Try to eat at least 2 vegetarian meals each week.  Eat more home-cooked food and less restaurant, buffet, and fast food.  When eating at a restaurant, ask that your food be prepared with less salt or no salt, if possible. What foods are recommended? The items listed may not be a complete list. Talk with your dietitian about what dietary choices are best for you. Grains Whole-grain or whole-wheat bread. Whole-grain or whole-wheat pasta. Brown rice. Oatmeal. Quinoa. Bulgur. Whole-grain and low-sodium cereals. Pita bread. Low-fat, low-sodium crackers. Whole-wheat flour  tortillas. Vegetables Fresh or frozen vegetables (raw, steamed, roasted, or grilled). Low-sodium or reduced-sodium tomato and vegetable juice. Low-sodium or reduced-sodium tomato sauce and tomato paste. Low-sodium or reduced-sodium canned vegetables. Fruits All fresh, dried, or frozen fruit. Canned fruit in natural juice (without added sugar). Meat and other protein foods Skinless chicken or turkey. Ground chicken or turkey. Pork with fat trimmed off. Fish and seafood. Egg whites. Dried beans, peas, or lentils. Unsalted nuts, nut butters, and seeds. Unsalted canned beans. Lean cuts of beef with fat trimmed off. Low-sodium, lean deli meat. Dairy Low-fat (1%) or fat-free (skim) milk. Fat-free, low-fat, or reduced-fat cheeses. Nonfat, low-sodium ricotta or cottage cheese. Low-fat or nonfat yogurt. Low-fat, low-sodium cheese. Fats and oils Soft margarine without trans fats. Vegetable oil. Low-fat, reduced-fat, or light mayonnaise and salad dressings (reduced-sodium). Canola, safflower, olive, soybean, and sunflower oils. Avocado. Seasoning and other foods Herbs. Spices. Seasoning mixes without salt. Unsalted popcorn and pretzels. Fat-free sweets. What foods are not recommended? The items listed may not be a complete list. Talk with your dietitian about what dietary choices are best for you. Grains Baked goods made with fat, such as croissants, muffins, or some breads. Dry pasta or rice meal packs. Vegetables Creamed or fried vegetables. Vegetables in a cheese sauce. Regular canned vegetables (not low-sodium or reduced-sodium). Regular canned tomato sauce and paste (not low-sodium or reduced-sodium). Regular tomato and vegetable juice (not low-sodium or reduced-sodium). Pickles. Olives. Fruits Canned fruit in a light or heavy syrup. Fried fruit. Fruit in cream or butter sauce. Meat and other protein foods Fatty cuts of meat. Ribs. Fried meat. Bacon. Sausage. Bologna and other processed lunch meats.  Salami. Fatback. Hotdogs. Bratwurst. Salted nuts and seeds. Canned beans with added salt. Canned or smoked fish. Whole eggs or egg yolks. Chicken or turkey with skin. Dairy Whole or 2% milk, cream, and half-and-half. Whole or full-fat cream cheese. Whole-fat or sweetened yogurt. Full-fat cheese. Nondairy creamers. Whipped toppings. Processed cheese and cheese spreads. Fats and oils Butter. Stick margarine. Lard. Shortening. Ghee. Bacon fat. Tropical oils, such as coconut, palm kernel, or palm oil. Seasoning and other foods Salted popcorn and pretzels. Onion salt, garlic salt, seasoned   salt, table salt, and sea salt. Worcestershire sauce. Tartar sauce. Barbecue sauce. Teriyaki sauce. Soy sauce, including reduced-sodium. Steak sauce. Canned and packaged gravies. Fish sauce. Oyster sauce. Cocktail sauce. Horseradish that you find on the shelf. Ketchup. Mustard. Meat flavorings and tenderizers. Bouillon cubes. Hot sauce and Tabasco sauce. Premade or packaged marinades. Premade or packaged taco seasonings. Relishes. Regular salad dressings. Where to find more information:  National Heart, Lung, and Blood Institute: www.nhlbi.nih.gov  American Heart Association: www.heart.org Summary  The DASH eating plan is a healthy eating plan that has been shown to reduce high blood pressure (hypertension). It may also reduce your risk for type 2 diabetes, heart disease, and stroke.  With the DASH eating plan, you should limit salt (sodium) intake to 2,300 mg a day. If you have hypertension, you may need to reduce your sodium intake to 1,500 mg a day.  When on the DASH eating plan, aim to eat more fresh fruits and vegetables, whole grains, lean proteins, low-fat dairy, and heart-healthy fats.  Work with your health care provider or diet and nutrition specialist (dietitian) to adjust your eating plan to your individual calorie needs. This information is not intended to replace advice given to you by your health  care provider. Make sure you discuss any questions you have with your health care provider. Document Revised: 12/05/2016 Document Reviewed: 12/17/2015 [employer] Patient Education  2020 [employer]  

## 2019-05-25 NOTE — Progress Notes (Signed)
Established Patient Office Visit  Subjective:  Patient ID: Marissa Larson, female    DOB: May 10, 1955  Age: 64 y.o. MRN: [id number]  CC:  Chief Complaint  Patient presents with  . Hypertension    HPI [name] presents for elevated blood pressures. She was seen by her endocrinologist on 05/19/19 and her blood pressure was elevated at 160/120 and HR 85. Pt brings in BP log and range is 140s-180s/80s-103, HR 60s-70s. She denies chest pain, dizziness, jaw or neck pain. She reports history of PVCs and sometimes has intermittent palpitations. She symptomatic with headache, nausea and lightheadedness. Reports in the past she was on HCTZ 12.5 mg and tolerated medication well.   Past Medical History:  Diagnosis Date  . Arthritis   . Chest pain 06/2013   Left sided  . Chronic leg pain 06/2013  . Complication of anesthesia   . Dysrhythmia   . Fatigue   . GERD (gastroesophageal reflux disease)   . History of kidney stones   . Hypertension   . Hypothyroidism   . Nonproductive cough 06/2013  . Overweight 01/18/2014  . Peripheral neuropathy   . PONV (postoperative nausea and vomiting)   . Sleep difficulties    Change in sleep patterns  . SOB (shortness of breath) 06/2013  . Tachycardia 06/2013  . Thyroid disease    hypo  . UTI (lower urinary tract infection)   . Vesico-ureteric reflux     Past Surgical History:  Procedure Laterality Date  . ABDOMINAL SURGERY  1994  . CYSTOSCOPY/URETEROSCOPY/HOLMIUM LASER/STENT PLACEMENT Left 09/22/2017   Procedure: CYSTOSCOPY/RETROGRADE/URETEROSCOPY/ BASKET STONE EXTRACTION/STENT PLACEMENT;  Surgeon: Rene Paci, MD;  Location: [facility];  Service: Urology;  Laterality: Left;  ONLY NEEDS 60 MIN  . FOOT SURGERY Right 2009  . KIDNEY SURGERY Left 1993  . KNEE SURGERY     Multiple knee surgeries  . OOPHORECTOMY Left 1994  . 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 Thurston Hole  . small fiber neuropathy biopsy  12/2017  . TONSILLECTOMY     . URETEROSCOPY VIA URETEROSTOMY Left 1994    Family History  Problem Relation Age of Onset  . Migraines Other        Fam Hx   . Heart disease Other        Fam Hx  . Thyroid disease Other        Fam Hx - She also has thyroid disease  . Diabetes Other        Fam Hx  . Arthritis Other        Fam Hx  . Gallbladder disease Other        Fam Hx  . Cancer Other        Fam Hx - Pancreatic  . Hypertension Other        Fam Hx  . Heart attack Cousin        Multiple cousins with MI's before 25 years of age  . Heart disease Mother 52  . Hyperlipidemia Mother 62  . Hypertension Mother 9  . Stroke Mother 78  . Heart disease Maternal Grandmother   . Cancer Father 82       pancreatic  . Diabetes Brother 50  . Heart attack Maternal Uncle   . Heart attack Paternal Aunt     Social History   Socioeconomic History  . Marital status: Married    Spouse name: Not on file  . Number of children: 0  . Years of education: Not on file  .  Highest education level: Not on file  Occupational History  . Occupation: Scientist, research (medical)  Tobacco Use  . Smoking status: Never Smoker  . Smokeless tobacco: Never Used  Substance and Sexual Activity  . Alcohol use: Yes    Alcohol/week: 1.0 - 2.0 standard drinks    Types: 1 - 2 Glasses of wine per week    Comment: Twice a week  . Drug use: No  . Sexual activity: Yes    Birth control/protection: None  Other Topics Concern  . Not on file  Social History Narrative  . Not on file   Social Determinants of Health   Financial Resource Strain:   . Difficulty of Paying Living Expenses:   Food Insecurity:   . Worried About Charity fundraiser in the Last Year:   . Arboriculturist in the Last Year:   Transportation Needs:   . Film/video editor (Medical):   Marland Kitchen Lack of Transportation (Non-Medical):   Physical Activity:   . Days of Exercise per Week:   . Minutes of Exercise per Session:   Stress:   . Feeling of Stress :   Social  Connections:   . Frequency of Communication with Friends and Family:   . Frequency of Social Gatherings with Friends and Family:   . Attends Religious Services:   . Active Member of Clubs or Organizations:   . Attends Archivist Meetings:   Marland Kitchen Marital Status:   Intimate Partner Violence:   . Fear of Current or Ex-Partner:   . Emotionally Abused:   Marland Kitchen Physically Abused:   . Sexually Abused:     Outpatient Medications Prior to Visit  Medication Sig Dispense Refill  . Cholecalciferol (VITAMIN D) 50 MCG (2000 UT) CAPS 2,000 daily OTC in addition to once weekly prescription 30 capsule   . Colchicine (MITIGARE) 0.6 MG CAPS Take 1 capsule by mouth daily. 90 capsule 0  . CREAM BASE EX Apply 1 application topically daily. BIEST Transdermal Cream 0.5 mg: Compounded bio-identical Estriol (E3) combined by [facility]    . escitalopram (LEXAPRO) 10 MG tablet Take 1 tablet (10 mg total) by mouth at bedtime. 90 tablet 1  . eszopiclone (LUNESTA) 1 MG TABS tablet Take 1 mg by mouth at bedtime as needed.    . famotidine (PEPCID) 40 MG tablet Take 40 mg by mouth daily as needed (for heartburn/indigestion.).   2  . Flaxseed, Linseed, (FLAX SEED OIL) 1000 MG CAPS Take 2,000 mg by mouth every evening.    . hydroxychloroquine (PLAQUENIL) 200 MG tablet Take 1 tablet (200 mg total) by mouth daily. 90 tablet 0  . Magnesium 500 MG CAPS Take 1,000 mg by mouth every evening.    . Misc Natural Products (TART CHERRY ADVANCED PO) Take 2,000 mg by mouth daily. 1000 mg per capsule    . nitrofurantoin, macrocrystal-monohydrate, (MACROBID) 100 MG capsule Take by mouth daily. UTI prevention    . NONFORMULARY OR COMPOUNDED ITEM Take 150 mg by mouth at bedtime. Bio identical Progesterone 150 ([facility])    . Omega-3 Fatty Acids (FISH OIL) 1200 MG CAPS Take 2,400 mg by mouth every evening.    Marland Kitchen SYNTHROID 112 MCG tablet Take 1 tablet (112 mcg total) by mouth daily before breakfast. 90 tablet 3  .  Vitamin D, Ergocalciferol, (DRISDOL) 1.25 MG (50000 UNIT) CAPS capsule Take 1 capsule (50,000 Units total) by mouth every 7 (seven) days. 12 capsule 1  . ibuprofen (ADVIL,MOTRIN)  200 MG tablet Take 400 mg by mouth every 8 (eight) hours as needed (for pain.).     No facility-administered medications prior to visit.    Allergies  Allergen Reactions  . Penicillins Hives and Itching    Has patient had a PCN reaction causing immediate rash, facial/tongue/throat swelling, SOB or lightheadedness with hypotension: No Has patient had a PCN reaction causing severe rash involving mucus membranes or skin necrosis: No Has patient had a PCN reaction that required hospitalization: No Has patient had a PCN reaction occurring within the last 10 years: No If all of the above answers are "NO", then may proceed with Cephalosporin use.   . Sulfa Antibiotics Hives and Itching    Bactrim    ROS Review of Systems  A fourteen system review of systems was performed and found to be positive as per HPI.    Objective:    Physical Exam  General:  Well Developed, well nourished, appropriate for stated age.  Neuro:  Alert and oriented,  extra-ocular muscles intact  HEENT:  Normocephalic, atraumatic, neck supple, no carotid bruits appreciated  Skin:  no gross rash, warm, pink. Cardiac:  RRR, S1 S2 Respiratory:  ECTA B/L and A/P, Not using accessory muscles, speaking in full sentences- unlabored. Vascular:  Ext warm, no cyanosis apprec.; cap RF less 2 sec. Psych:  No HI/SI, judgement and insight good, Euthymic mood. Full Affect.  BP (!) 150/83   Pulse 71   Temp 97.9 F (36.6 C) (Oral)   Ht 5\' 6"  (1.676 m)   Wt 197 lb 6.4 oz (89.5 kg)   SpO2 96%   BMI 31.86 kg/m  Wt Readings from Last 3 Encounters:  05/25/19 197 lb 6.4 oz (89.5 kg)  05/19/19 198 lb (89.8 kg)  04/19/19 198 lb (89.8 kg)     Health Maintenance Due  Topic Date Due  . COVID-19 Vaccine (1) Never done  . HIV Screening  Never done     There are no preventive care reminders to display for this patient.  Lab Results  Component Value Date   TSH 1.17 05/19/2019   Lab Results  Component Value Date   WBC 6.2 05/19/2019   HGB 14.7 05/19/2019   HCT 43.3 05/19/2019   MCV 92.1 05/19/2019   PLT 248 05/19/2019   Lab Results  Component Value Date   NA 140 05/19/2019   K 4.2 05/19/2019   CO2 28 05/19/2019   GLUCOSE 108 (H) 05/19/2019   BUN 17 05/19/2019   CREATININE 0.55 05/19/2019   BILITOT 0.4 05/19/2019   ALKPHOS 84 06/17/2018   AST 24 05/19/2019   ALT 23 05/19/2019   PROT 6.8 05/19/2019   ALBUMIN 4.3 06/17/2018   CALCIUM 9.4 05/19/2019   ANIONGAP 10 09/21/2017   Lab Results  Component Value Date   CHOL 227 (H) 01/04/2019   Lab Results  Component Value Date   HDL 92 01/04/2019   Lab Results  Component Value Date   LDLCALC 112 (H) 01/04/2019   Lab Results  Component Value Date   TRIG 136 01/04/2019   Lab Results  Component Value Date   CHOLHDL 2.5 01/04/2019   Lab Results  Component Value Date   HGBA1C 5.5 05/19/2019      Assessment & Plan:   Problem List Items Addressed This Visit      Cardiovascular and Mediastinum   HTN (hypertension) - Primary (Chronic)   Relevant Medications   hydrochlorothiazide (HYDRODIURIL) 12.5 MG tablet  Meds ordered this encounter  Medications  . hydrochlorothiazide (HYDRODIURIL) 12.5 MG tablet    Sig: Take 1 tablet (12.5 mg total) by mouth daily.    Dispense:  30 tablet    Refill:  1    Order Specific Question:   Supervising Provider    Answer:   Nani Gasser D [[id number]]   HTN: - BP today is 162/85, elevated and above goal. - Pt expressed motivation to make dietary and lifestyle changes to help control BP and would like to try HCTZ again since she tolerated medication without side effects. - Start HCTZ 12.5 mg  - Continue ambulatory BP and pulse monitoring and keep a log to bring at follow-up visit.  - Follow DASH diet and increase  physical activity.    Follow-up: Return in about 4 weeks (around 06/22/2019) for HTN, new med started, BW- CMP.    Mayer Masker, PA-C

## 2019-05-27 ENCOUNTER — Ambulatory Visit: Payer: BC Managed Care – PPO | Admitting: Physician Assistant

## 2019-06-02 ENCOUNTER — Ambulatory Visit: Payer: BC Managed Care – PPO | Admitting: Physician Assistant

## 2019-06-07 NOTE — Progress Notes (Deleted)
Office Visit Note  Patient: Marissa Larson             Date of Birth: July 05, 1955           MRN: [id number]             PCP: Mayer Masker, PA-C Referring: Thomasene Lot, DO Visit Date: 06/17/2019 Occupation: @GUAROCC @  Subjective:  No chief complaint on file.   History of Present Illness: Marissa Larson is a 64 y.o. female ***   Activities of Daily Living:  Patient reports morning stiffness for *** {minute/hour:19697}.   Patient {ACTIONS;DENIES/REPORTS:21021675::"Denies"} nocturnal pain.  Difficulty dressing/grooming: {ACTIONS;DENIES/REPORTS:21021675::"Denies"} Difficulty climbing stairs: {ACTIONS;DENIES/REPORTS:21021675::"Denies"} Difficulty getting out of chair: {ACTIONS;DENIES/REPORTS:21021675::"Denies"} Difficulty using hands for taps, buttons, cutlery, and/or writing: {ACTIONS;DENIES/REPORTS:21021675::"Denies"}  No Rheumatology ROS completed.   PMFS History:  Patient Active Problem List   Diagnosis Date Noted  . Elevated LDL cholesterol level 07/19/2018  . Prediabetes 07/19/2018  . Paresthesia 12/09/2017  . Chronic insomnia 11/24/2017  . Insomnia secondary to chronic pain 11/24/2017  . Complaint related to dreams 11/24/2017  . 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 Neuropathy 08/18/2017  . 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 Obesity, Class I, BMI 30-34.9 12/03/2016  . Kidney stones 09/12/2016  . Nausea 09/12/2016  . High risk medication use 07/30/2016  . NAFLD (nonalcoholic fatty liver disease) 08/01/2016  . Primary osteoarthritis of both feet 04/12/2016  . History of kidney stones 04/12/2016  . Primary osteoarthritis of both hands 04/11/2016  . Bilateral primary osteoarthritis of hip 04/11/2016  . Primary osteoarthritis of both knees 04/11/2016  . Spondylosis of lumbar region without myelopathy or radiculopathy 04/11/2016  . Hyperuricemia 04/11/2016  .  Primary insomnia 04/11/2016  . Flank pain 04/02/2016  . Sinusitis 04/02/2016  . Elevated HDL 11/11/2015  . Elevated LFTs 11/11/2015  . Adjustment disorder with mixed anxiety and depressed mood 11/11/2015  . Dysuria 11/11/2015  . Hypothyroidism 10/14/2015  . Pseudogout involving multiple joints 10/14/2015  . Vitamin D deficiency 10/14/2015  . Hormone replacement therapy- per GYN 10/14/2015  . HTN (hypertension) 10/14/2015  . Fatigue 10/14/2015  . h/o Hiatal hernia 10/14/2015  . Sleep difficulties 10/14/2015  . Generalized OA 10/14/2015  . Counseling on health promotion and disease prevention 10/14/2015  . 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 Age-related nuclear cataract of both eyes 01/25/2015  . Posterior vitreous detachment of left eye 01/25/2015  . chronic Palpitations 07/19/2013    Past Medical History:  Diagnosis Date  . Arthritis   . Chest pain 06/2013   Left sided  . Chronic leg pain 06/2013  . Complication of anesthesia   . Dysrhythmia   . Fatigue   . GERD (gastroesophageal reflux disease)   . History of kidney stones   . Hypertension   . Hypothyroidism   . Nonproductive cough 06/2013  . Overweight 01/18/2014  . Peripheral neuropathy   . PONV (postoperative nausea and vomiting)   . Sleep difficulties    Change in sleep patterns  . SOB (shortness of breath) 06/2013  . Tachycardia 06/2013  . Thyroid disease    hypo  . UTI (lower urinary tract infection)   . Vesico-ureteric reflux     Family History  Problem Relation Age of Onset  . Migraines Other        Fam Hx   . Heart disease Other        Fam  Hx  . Thyroid disease Other        Fam Hx - She also has thyroid disease  . Diabetes Other        Fam Hx  . Arthritis Other        Fam Hx  . Gallbladder disease Other        Fam Hx  . Cancer Other        Fam Hx - Pancreatic  . Hypertension Other        Fam Hx  . Heart attack Cousin        Multiple cousins with MI's before 53 years of age  . Heart disease  Mother 62  . Hyperlipidemia Mother 47  . Hypertension Mother 11  . Stroke Mother 52  . Heart disease Maternal Grandmother   . Cancer Father 53       pancreatic  . Diabetes Brother 29  . Heart attack Maternal Uncle   . Heart attack Paternal Aunt    Past Surgical History:  Procedure Laterality Date  . ABDOMINAL SURGERY  1994  . CYSTOSCOPY/URETEROSCOPY/HOLMIUM LASER/STENT PLACEMENT Left 09/22/2017   Procedure: CYSTOSCOPY/RETROGRADE/URETEROSCOPY/ BASKET STONE EXTRACTION/STENT PLACEMENT;  Surgeon: Ceasar Mons, MD;  Location: [facility];  Service: Urology;  Laterality: Left;  ONLY NEEDS 60 MIN  . FOOT SURGERY Right 2009  . KIDNEY SURGERY Left 1993  . KNEE SURGERY     Multiple knee surgeries  . OOPHORECTOMY Left 1994  . 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 Noemi Chapel  . small fiber neuropathy biopsy  12/2017  . TONSILLECTOMY    . URETEROSCOPY VIA URETEROSTOMY Left 1994   Social History   Social History Narrative  . Not on file   Immunization History  Administered Date(s) Administered  . Tdap 01/07/2011     Objective: Vital Signs: There were no vitals taken for this visit.   Physical Exam   Musculoskeletal Exam: ***  CDAI Exam: CDAI Score: -- Patient Global: --; Provider Global: -- Swollen: --; Tender: -- Joint Exam 06/17/2019   No joint exam has been documented for this visit   There is currently no information documented on the homunculus. Go to the Rheumatology activity and complete the homunculus joint exam.  Investigation: No additional findings.  Imaging: No results found.  Recent Labs: Lab Results  Component Value Date   WBC 6.2 05/19/2019   HGB 14.7 05/19/2019   PLT 248 05/19/2019   NA 140 05/19/2019   K 4.2 05/19/2019   CL 104 05/19/2019   CO2 28 05/19/2019   GLUCOSE 108 (H) 05/19/2019   BUN 17 05/19/2019   CREATININE 0.55 05/19/2019   BILITOT 0.4 05/19/2019   ALKPHOS 84 06/17/2018   AST 24 05/19/2019   ALT 23 05/19/2019    PROT 6.8 05/19/2019   ALBUMIN 4.3 06/17/2018   CALCIUM 9.4 05/19/2019   GFRAA 117 05/19/2019   QFTBGOLDPLUS NEGATIVE 02/16/2017    Speciality Comments: PLQ eye exam: 04/20/2019 normal. Dr. Satira Sark. Follow up in 1 year.  Procedures:  No procedures performed Allergies: Penicillins and Sulfa antibiotics   Assessment / Plan:     Visit Diagnoses: No diagnosis found.  Orders: No orders of the defined types were placed in this encounter.  No orders of the defined types were placed in this encounter.   Face-to-face time spent with patient was *** minutes. Greater than 50% of time was spent in counseling and coordination of care.  Follow-Up Instructions: No follow-ups on file.   Earnestine Mealing,  CMA  Note - This record has been created using [employer].  Chart creation errors have been sought, but may not always  have been located. Such creation errors do not reflect on  the standard of medical care.

## 2019-06-12 DIAGNOSIS — M1711 Unilateral primary osteoarthritis, right knee: Secondary | ICD-10-CM | POA: Diagnosis not present

## 2019-06-14 DIAGNOSIS — M1711 Unilateral primary osteoarthritis, right knee: Secondary | ICD-10-CM | POA: Diagnosis not present

## 2019-06-15 ENCOUNTER — Other Ambulatory Visit: Payer: Self-pay | Admitting: Physician Assistant

## 2019-06-15 DIAGNOSIS — E7889 Other lipoprotein metabolism disorders: Secondary | ICD-10-CM

## 2019-06-15 DIAGNOSIS — E039 Hypothyroidism, unspecified: Secondary | ICD-10-CM

## 2019-06-15 DIAGNOSIS — R7989 Other specified abnormal findings of blood chemistry: Secondary | ICD-10-CM

## 2019-06-15 DIAGNOSIS — R7303 Prediabetes: Secondary | ICD-10-CM

## 2019-06-15 DIAGNOSIS — Z79890 Hormone replacement therapy (postmenopausal): Secondary | ICD-10-CM

## 2019-06-15 DIAGNOSIS — I10 Essential (primary) hypertension: Secondary | ICD-10-CM

## 2019-06-15 DIAGNOSIS — E559 Vitamin D deficiency, unspecified: Secondary | ICD-10-CM

## 2019-06-15 DIAGNOSIS — E790 Hyperuricemia without signs of inflammatory arthritis and tophaceous disease: Secondary | ICD-10-CM

## 2019-06-16 DIAGNOSIS — M1711 Unilateral primary osteoarthritis, right knee: Secondary | ICD-10-CM | POA: Diagnosis not present

## 2019-06-17 ENCOUNTER — Other Ambulatory Visit: Payer: BC Managed Care – PPO

## 2019-06-17 ENCOUNTER — Ambulatory Visit: Payer: BC Managed Care – PPO | Admitting: Physician Assistant

## 2019-06-17 NOTE — Progress Notes (Deleted)
Office Visit Note  Patient: Marissa Larson             Date of Birth: 02-19-1955           MRN: [id number]             PCP: Mayer Masker, PA-C Referring: Thomasene Lot, DO Visit Date: 06/28/2019 Occupation: @GUAROCC @  Subjective:  No chief complaint on file.   History of Present Illness: Marissa Larson is a 64 y.o. female ***   Activities of Daily Living:  Patient reports morning stiffness for *** {minute/hour:19697}.   Patient {ACTIONS;DENIES/REPORTS:21021675::"Denies"} nocturnal pain.  Difficulty dressing/grooming: {ACTIONS;DENIES/REPORTS:21021675::"Denies"} Difficulty climbing stairs: {ACTIONS;DENIES/REPORTS:21021675::"Denies"} Difficulty getting out of chair: {ACTIONS;DENIES/REPORTS:21021675::"Denies"} Difficulty using hands for taps, buttons, cutlery, and/or writing: {ACTIONS;DENIES/REPORTS:21021675::"Denies"}  No Rheumatology ROS completed.   PMFS History:  Patient Active Problem List   Diagnosis Date Noted  . Elevated LDL cholesterol level 07/19/2018  . Prediabetes 07/19/2018  . Paresthesia 12/09/2017  . Chronic insomnia 11/24/2017  . Insomnia secondary to chronic pain 11/24/2017  . Complaint related to dreams 11/24/2017  . 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 Neuropathy 08/18/2017  . 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 Obesity, Class I, BMI 30-34.9 12/03/2016  . Kidney stones 09/12/2016  . Nausea 09/12/2016  . High risk medication use 07/30/2016  . NAFLD (nonalcoholic fatty liver disease) 08/01/2016  . Primary osteoarthritis of both feet 04/12/2016  . History of kidney stones 04/12/2016  . Primary osteoarthritis of both hands 04/11/2016  . Bilateral primary osteoarthritis of hip 04/11/2016  . Primary osteoarthritis of both knees 04/11/2016  . Spondylosis of lumbar region without myelopathy or radiculopathy 04/11/2016  . Hyperuricemia 04/11/2016  .  Primary insomnia 04/11/2016  . Flank pain 04/02/2016  . Sinusitis 04/02/2016  . Elevated HDL 11/11/2015  . Elevated LFTs 11/11/2015  . Adjustment disorder with mixed anxiety and depressed mood 11/11/2015  . Dysuria 11/11/2015  . Hypothyroidism 10/14/2015  . Pseudogout involving multiple joints 10/14/2015  . Vitamin D deficiency 10/14/2015  . Hormone replacement therapy- per GYN 10/14/2015  . HTN (hypertension) 10/14/2015  . Fatigue 10/14/2015  . h/o Hiatal hernia 10/14/2015  . Sleep difficulties 10/14/2015  . Generalized OA 10/14/2015  . Counseling on health promotion and disease prevention 10/14/2015  . 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 Age-related nuclear cataract of both eyes 01/25/2015  . Posterior vitreous detachment of left eye 01/25/2015  . chronic Palpitations 07/19/2013    Past Medical History:  Diagnosis Date  . Arthritis   . Chest pain 06/2013   Left sided  . Chronic leg pain 06/2013  . Complication of anesthesia   . Dysrhythmia   . Fatigue   . GERD (gastroesophageal reflux disease)   . History of kidney stones   . Hypertension   . Hypothyroidism   . Nonproductive cough 06/2013  . Overweight 01/18/2014  . Peripheral neuropathy   . PONV (postoperative nausea and vomiting)   . Sleep difficulties    Change in sleep patterns  . SOB (shortness of breath) 06/2013  . Tachycardia 06/2013  . Thyroid disease    hypo  . UTI (lower urinary tract infection)   . Vesico-ureteric reflux     Family History  Problem Relation Age of Onset  . Migraines Other        Fam Hx   . Heart disease Other        Fam  Hx  . Thyroid disease Other        Fam Hx - She also has thyroid disease  . Diabetes Other        Fam Hx  . Arthritis Other        Fam Hx  . Gallbladder disease Other        Fam Hx  . Cancer Other        Fam Hx - Pancreatic  . Hypertension Other        Fam Hx  . Heart attack Cousin        Multiple cousins with MI's before 80 years of age  . Heart disease  Mother 72  . Hyperlipidemia Mother 24  . Hypertension Mother 26  . Stroke Mother 70  . Heart disease Maternal Grandmother   . Cancer Father 64       pancreatic  . Diabetes Brother 50  . Heart attack Maternal Uncle   . Heart attack Paternal Aunt    Past Surgical History:  Procedure Laterality Date  . ABDOMINAL SURGERY  1994  . CYSTOSCOPY/URETEROSCOPY/HOLMIUM LASER/STENT PLACEMENT Left 09/22/2017   Procedure: CYSTOSCOPY/RETROGRADE/URETEROSCOPY/ BASKET STONE EXTRACTION/STENT PLACEMENT;  Surgeon: Rene Paci, MD;  Location: [facility];  Service: Urology;  Laterality: Left;  ONLY NEEDS 60 MIN  . FOOT SURGERY Right 2009  . KIDNEY SURGERY Left 1993  . KNEE SURGERY     Multiple knee surgeries  . OOPHORECTOMY Left 1994  . 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 Thurston Hole  . small fiber neuropathy biopsy  12/2017  . TONSILLECTOMY    . URETEROSCOPY VIA URETEROSTOMY Left 1994   Social History   Social History Narrative  . Not on file   Immunization History  Administered Date(s) Administered  . Tdap 01/07/2011     Objective: Vital Signs: There were no vitals taken for this visit.   Physical Exam   Musculoskeletal Exam: ***  CDAI Exam: CDAI Score: -- Patient Global: --; Provider Global: -- Swollen: --; Tender: -- Joint Exam 06/28/2019   No joint exam has been documented for this visit   There is currently no information documented on the homunculus. Go to the Rheumatology activity and complete the homunculus joint exam.  Investigation: No additional findings.  Imaging: No results found.  Recent Labs: Lab Results  Component Value Date   WBC 6.2 05/19/2019   HGB 14.7 05/19/2019   PLT 248 05/19/2019   NA 140 05/19/2019   K 4.2 05/19/2019   CL 104 05/19/2019   CO2 28 05/19/2019   GLUCOSE 108 (H) 05/19/2019   BUN 17 05/19/2019   CREATININE 0.55 05/19/2019   BILITOT 0.4 05/19/2019   ALKPHOS 84 06/17/2018   AST 24 05/19/2019   ALT 23 05/19/2019    PROT 6.8 05/19/2019   ALBUMIN 4.3 06/17/2018   CALCIUM 9.4 05/19/2019   GFRAA 117 05/19/2019   QFTBGOLDPLUS NEGATIVE 02/16/2017    Speciality Comments: PLQ eye exam: 04/20/2019 normal. Dr. Burgess Estelle. Follow up in 1 year.  Procedures:  No procedures performed Allergies: Penicillins and Sulfa antibiotics   Assessment / Plan:     Visit Diagnoses: Inflammatory arthritis  High risk medication use  Pseudogout involving multiple joints  Primary osteoarthritis of both hands  Bilateral primary osteoarthritis of hip  Primary osteoarthritis of right knee  S/P total knee replacement, left  Primary osteoarthritis of both feet  Spondylosis of lumbar region without myelopathy or radiculopathy  Hyperuricemia  NAFLD (nonalcoholic fatty liver disease)  Essential hypertension  Small fiber neuropathy  h/o Hiatal hernia  Vitamin D deficiency  Primary insomnia  History of kidney stones  Orders: No orders of the defined types were placed in this encounter.  No orders of the defined types were placed in this encounter.   Face-to-face time spent with patient was *** minutes. Greater than 50% of time was spent in counseling and coordination of care.  Follow-Up Instructions: No follow-ups on file.   Ofilia Neas, PA-C  Note - This record has been created using Dragon software.  Chart creation errors have been sought, but may not always  have been located. Such creation errors do not reflect on  the standard of medical care.

## 2019-06-22 ENCOUNTER — Encounter: Payer: BC Managed Care – PPO | Admitting: Physician Assistant

## 2019-06-28 ENCOUNTER — Ambulatory Visit: Payer: BC Managed Care – PPO | Admitting: Physician Assistant

## 2019-07-01 NOTE — Progress Notes (Deleted)
Office Visit Note  Patient: Marissa Larson             Date of Birth: August 14, 1955           MRN: [id number]             PCP: Lorrene Reid, PA-C Referring: Mellody Dance, DO Visit Date: 07/07/2019 Occupation: @GUAROCC @  Subjective:  No chief complaint on file.   History of Present Illness: Marissa Larson is a 64 y.o. female ***   Activities of Daily Living:  Patient reports morning stiffness for *** {minute/hour:19697}.   Patient {ACTIONS;DENIES/REPORTS:21021675::"Denies"} nocturnal pain.  Difficulty dressing/grooming: {ACTIONS;DENIES/REPORTS:21021675::"Denies"} Difficulty climbing stairs: {ACTIONS;DENIES/REPORTS:21021675::"Denies"} Difficulty getting out of chair: {ACTIONS;DENIES/REPORTS:21021675::"Denies"} Difficulty using hands for taps, buttons, cutlery, and/or writing: {ACTIONS;DENIES/REPORTS:21021675::"Denies"}  No Rheumatology ROS completed.   PMFS History:  Patient Active Problem List   Diagnosis Date Noted  . Elevated LDL cholesterol level 07/19/2018  . Prediabetes 07/19/2018  . Paresthesia 12/09/2017  . Chronic insomnia 11/24/2017  . Insomnia secondary to chronic pain 11/24/2017  . Complaint related to dreams 11/24/2017  . 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 Neuropathy 08/18/2017  . 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 Obesity, Class I, BMI 30-34.9 12/03/2016  . Kidney stones 09/12/2016  . Nausea 09/12/2016  . High risk medication use 07/30/2016  . NAFLD (nonalcoholic fatty liver disease) 07/30/2016  . Primary osteoarthritis of both feet 04/12/2016  . History of kidney stones 04/12/2016  . Primary osteoarthritis of both hands 04/11/2016  . Bilateral primary osteoarthritis of hip 04/11/2016  . Primary osteoarthritis of both knees 04/11/2016  . Spondylosis of lumbar region without myelopathy or radiculopathy 04/11/2016  . Hyperuricemia 04/11/2016  .  Primary insomnia 04/11/2016  . Flank pain 04/02/2016  . Sinusitis 04/02/2016  . Elevated HDL 11/11/2015  . Elevated LFTs 11/11/2015  . Adjustment disorder with mixed anxiety and depressed mood 11/11/2015  . Dysuria 11/11/2015  . Hypothyroidism 10/14/2015  . Pseudogout involving multiple joints 10/14/2015  . Vitamin D deficiency 10/14/2015  . Hormone replacement therapy- per GYN 10/14/2015  . HTN (hypertension) 10/14/2015  . Fatigue 10/14/2015  . h/o Hiatal hernia 10/14/2015  . Sleep difficulties 10/14/2015  . Generalized OA 10/14/2015  . Counseling on health promotion and disease prevention 10/14/2015  . 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 Age-related nuclear cataract of both eyes 01/25/2015  . Posterior vitreous detachment of left eye 01/25/2015  . chronic Palpitations 07/19/2013    Past Medical History:  Diagnosis Date  . Arthritis   . Chest pain 06/2013   Left sided  . Chronic leg pain 06/2013  . Complication of anesthesia   . Dysrhythmia   . Fatigue   . GERD (gastroesophageal reflux disease)   . History of kidney stones   . Hypertension   . Hypothyroidism   . Nonproductive cough 06/2013  . Overweight 01/18/2014  . Peripheral neuropathy   . PONV (postoperative nausea and vomiting)   . Sleep difficulties    Change in sleep patterns  . SOB (shortness of breath) 06/2013  . Tachycardia 06/2013  . Thyroid disease    hypo  . UTI (lower urinary tract infection)   . Vesico-ureteric reflux     Family History  Problem Relation Age of Onset  . Migraines Other        Fam Hx   . Heart disease Other        Fam  Hx  . Thyroid disease Other        Fam Hx - She also has thyroid disease  . Diabetes Other        Fam Hx  . Arthritis Other        Fam Hx  . Gallbladder disease Other        Fam Hx  . Cancer Other        Fam Hx - Pancreatic  . Hypertension Other        Fam Hx  . Heart attack Cousin        Multiple cousins with MI's before 81 years of age  . Heart disease  Mother 71  . Hyperlipidemia Mother 39  . Hypertension Mother 65  . Stroke Mother 40  . Heart disease Maternal Grandmother   . Cancer Father 32       pancreatic  . Diabetes Brother 50  . Heart attack Maternal Uncle   . Heart attack Paternal Aunt    Past Surgical History:  Procedure Laterality Date  . ABDOMINAL SURGERY  1994  . CYSTOSCOPY/URETEROSCOPY/HOLMIUM LASER/STENT PLACEMENT Left 09/22/2017   Procedure: CYSTOSCOPY/RETROGRADE/URETEROSCOPY/ BASKET STONE EXTRACTION/STENT PLACEMENT;  Surgeon: Rene Paci, MD;  Location: [facility];  Service: Urology;  Laterality: Left;  ONLY NEEDS 60 MIN  . FOOT SURGERY Right 2009  . KIDNEY SURGERY Left 1993  . KNEE SURGERY     Multiple knee surgeries  . OOPHORECTOMY Left 1994  . 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 Thurston Hole  . small fiber neuropathy biopsy  12/2017  . TONSILLECTOMY    . URETEROSCOPY VIA URETEROSTOMY Left 1994   Social History   Social History Narrative  . Not on file   Immunization History  Administered Date(s) Administered  . Tdap 01/07/2011     Objective: Vital Signs: There were no vitals taken for this visit.   Physical Exam   Musculoskeletal Exam: ***  CDAI Exam: CDAI Score: -- Patient Global: --; Provider Global: -- Swollen: --; Tender: -- Joint Exam 07/07/2019   No joint exam has been documented for this visit   There is currently no information documented on the homunculus. Go to the Rheumatology activity and complete the homunculus joint exam.  Investigation: No additional findings.  Imaging: No results found.  Recent Labs: Lab Results  Component Value Date   WBC 6.2 05/19/2019   HGB 14.7 05/19/2019   PLT 248 05/19/2019   NA 140 05/19/2019   K 4.2 05/19/2019   CL 104 05/19/2019   CO2 28 05/19/2019   GLUCOSE 108 (H) 05/19/2019   BUN 17 05/19/2019   CREATININE 0.55 05/19/2019   BILITOT 0.4 05/19/2019   ALKPHOS 84 06/17/2018   AST 24 05/19/2019   ALT 23 05/19/2019    PROT 6.8 05/19/2019   ALBUMIN 4.3 06/17/2018   CALCIUM 9.4 05/19/2019   GFRAA 117 05/19/2019   QFTBGOLDPLUS NEGATIVE 02/16/2017    Speciality Comments: PLQ eye exam: 04/20/2019 normal. Dr. Burgess Estelle. Follow up in 1 year.  Procedures:  No procedures performed Allergies: Penicillins and Sulfa antibiotics   Assessment / Plan:     Visit Diagnoses: No diagnosis found.  Orders: No orders of the defined types were placed in this encounter.  No orders of the defined types were placed in this encounter.   Face-to-face time spent with patient was *** minutes. Greater than 50% of time was spent in counseling and coordination of care.  Follow-Up Instructions: No follow-ups on file.   Ellen Henri,  CMA  Note - This record has been created using [employer].  Chart creation errors have been sought, but may not always  have been located. Such creation errors do not reflect on  the standard of medical care.

## 2019-07-05 DIAGNOSIS — M1711 Unilateral primary osteoarthritis, right knee: Secondary | ICD-10-CM | POA: Diagnosis not present

## 2019-07-07 ENCOUNTER — Ambulatory Visit: Payer: BC Managed Care – PPO | Admitting: Physician Assistant

## 2019-07-13 NOTE — Progress Notes (Signed)
Office Visit Note  Patient: Marissa Larson             Date of Birth: June 10, 1955           MRN: [id number]             PCP: Mayer MaskerAbonza, Maritza, PA-C Referring: Thomasene Lotpalski, Deborah, DO Visit Date: 07/26/2019 Occupation: @GUAROCC @  Subjective:  Pain in multiple joints.   History of Present Illness: Marissa Larson is a 64 y.o. female with history of seronegative inflammatory arthritis, pseudogout and osteoarthritis.  She had her left knee replacement in October 2020.  She was scheduled to have right total knee replacement in December.  It was postponed as she was not very symptomatic at the time.  She came to see us at the last visit in February at the time she was having pain in multiple joints including her wrist joints.  We suspected that she may have a component of seronegative rheumatoid arthritis.  At that time she was placed on Plaquenil.  She took Plaquenil twice a day Monday to Friday but could not tolerate the side effects.  She reduced the dose to once a day.  She states over the last month her right knee joint was worse and was having increased pain and discomfort to the point she was using a cane.  She was seen by Dr. Thurston HoleWainer about 4 weeks ago and received a cortisone injection and a prednisone taper.  She is feeling better now.  He is planning to do right total knee replacement in October.  She continues to have discomfort in her bilateral wrist joints.  Activities of Daily Living:  Patient reports morning stiffness for 20 minutes.   Patient Reports nocturnal pain.  Difficulty dressing/grooming: Denies Difficulty climbing stairs: Reports Difficulty getting out of chair: Reports Difficulty using hands for taps, buttons, cutlery, and/or writing: Reports  Review of Systems  Constitutional: Positive for fatigue. Negative for night sweats, weight gain and weight loss.  HENT: Positive for mouth sores and mouth dryness. Negative for trouble swallowing, trouble swallowing and nose dryness.     Eyes: Negative for pain, redness, itching, visual disturbance and dryness.  Respiratory: Negative for cough, shortness of breath and difficulty breathing.   Cardiovascular: Negative for chest pain, palpitations, hypertension, irregular heartbeat and swelling in legs/feet.  Gastrointestinal: Negative for blood in stool, constipation and diarrhea.  Endocrine: Negative for increased urination.  Genitourinary: Negative for difficulty urinating and vaginal dryness.  Musculoskeletal: Positive for arthralgias, joint pain, joint swelling and morning stiffness. Negative for myalgias, muscle weakness, muscle tenderness and myalgias.  Skin: Negative for color change, rash, hair loss, redness, skin tightness, ulcers and sensitivity to sunlight.  Allergic/Immunologic: Negative for susceptible to infections.  Neurological: Positive for weakness. Negative for dizziness, numbness, headaches, memory loss and night sweats.  Hematological: Negative for bruising/bleeding tendency and swollen glands.  Psychiatric/Behavioral: Positive for sleep disturbance. Negative for depressed mood and confusion. The patient is not nervous/anxious.     PMFS History:  Patient Active Problem List   Diagnosis Date Noted  . Elevated LDL cholesterol level 07/19/2018  . Prediabetes 07/19/2018  . Paresthesia 12/09/2017  . Chronic insomnia 11/24/2017  . Insomnia secondary to chronic pain 11/24/2017  . Complaint related to dreams 11/24/2017  . 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 Neuropathy 08/18/2017  . 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 Obesity, Class I, BMI 30-34.9 12/03/2016  .  Kidney stones 09/12/2016  . Nausea 09/12/2016  . High risk medication use 07/30/2016  . NAFLD (nonalcoholic fatty liver disease) 27/03/5007  . Primary osteoarthritis of both feet 04/12/2016  . History of kidney stones 04/12/2016  . Primary  osteoarthritis of both hands 04/11/2016  . Bilateral primary osteoarthritis of hip 04/11/2016  . Primary osteoarthritis of both knees 04/11/2016  . Spondylosis of lumbar region without myelopathy or radiculopathy 04/11/2016  . Hyperuricemia 04/11/2016  . Primary insomnia 04/11/2016  . Flank pain 04/02/2016  . Sinusitis 04/02/2016  . Elevated HDL 11/11/2015  . Elevated LFTs 11/11/2015  . Adjustment disorder with mixed anxiety and depressed mood 11/11/2015  . Dysuria 11/11/2015  . Hypothyroidism 10/14/2015  . Pseudogout involving multiple joints 10/14/2015  . Vitamin D deficiency 10/14/2015  . Hormone replacement therapy- per GYN 10/14/2015  . HTN (hypertension) 10/14/2015  . Fatigue 10/14/2015  . h/o Hiatal hernia 10/14/2015  . Sleep difficulties 10/14/2015  . Generalized OA 10/14/2015  . Counseling on health promotion and disease prevention 10/14/2015  . 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 Age-related nuclear cataract of both eyes 01/25/2015  . Posterior vitreous detachment of left eye 01/25/2015  . chronic Palpitations 07/19/2013    Past Medical History:  Diagnosis Date  . Arthritis   . Chest pain 06/2013   Left sided  . Chronic leg pain 06/2013  . Complication of anesthesia   . Dysrhythmia   . Fatigue   . GERD (gastroesophageal reflux disease)   . History of kidney stones   . Hypertension   . Hypothyroidism   . Nonproductive cough 06/2013  . Overweight 01/18/2014  . Peripheral neuropathy   . PONV (postoperative nausea and vomiting)   . Sleep difficulties    Change in sleep patterns  . SOB (shortness of breath) 06/2013  . Tachycardia 06/2013  . Thyroid disease    hypo  . UTI (lower urinary tract infection)   . Vesico-ureteric reflux     Family History  Problem Relation Age of Onset  . Migraines Other        Fam Hx   . Heart disease Other        Fam Hx  . Thyroid disease Other        Fam Hx - She also has thyroid disease  . Diabetes Other        Fam Hx    . Arthritis Other        Fam Hx  . Gallbladder disease Other        Fam Hx  . Cancer Other        Fam Hx - Pancreatic  . Hypertension Other        Fam Hx  . Heart attack Cousin        Multiple cousins with MI's before 66 years of age  . Heart disease Mother 48  . Hyperlipidemia Mother 17  . Hypertension Mother 64  . Stroke Mother 62  . Heart disease Maternal Grandmother   . Cancer Father 53       pancreatic  . Diabetes Brother 50  . Heart attack Maternal Uncle   . Heart attack Paternal Aunt    Past Surgical History:  Procedure Laterality Date  . ABDOMINAL SURGERY  1994  . CYSTOSCOPY/URETEROSCOPY/HOLMIUM LASER/STENT PLACEMENT Left 09/22/2017   Procedure: CYSTOSCOPY/RETROGRADE/URETEROSCOPY/ BASKET STONE EXTRACTION/STENT PLACEMENT;  Surgeon: Rene Paci, MD;  Location: [facility];  Service: Urology;  Laterality: Left;  ONLY NEEDS 60 MIN  . FOOT SURGERY  Right 2009  . KIDNEY SURGERY Left 1993  . KNEE SURGERY     Multiple knee surgeries  . OOPHORECTOMY Left 1994  . 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 Thurston Hole  . small fiber neuropathy biopsy  12/2017  . TONSILLECTOMY    . URETEROSCOPY VIA URETEROSTOMY Left 1994   Social History   Social History Narrative  . Not on file   Immunization History  Administered Date(s) Administered  . Tdap 01/07/2011     Objective: Vital Signs: BP (!) 141/79 (BP Location: Left Arm, Patient Position: Sitting, Cuff Size: Normal)   Pulse 76   Resp 16   Ht 5\' 6"  (1.676 m)   Wt 198 lb 9.6 oz (90.1 kg)   BMI 32.05 kg/m    Physical Exam Vitals and nursing note reviewed.  Constitutional:      Appearance: She is well-developed.  HENT:     Head: Normocephalic and atraumatic.  Eyes:     Conjunctiva/sclera: Conjunctivae normal.  Cardiovascular:     Rate and Rhythm: Normal rate and regular rhythm.     Heart sounds: Normal heart sounds.  Pulmonary:     Effort: Pulmonary effort is normal.     Breath sounds: Normal breath  sounds.  Abdominal:     General: Bowel sounds are normal.     Palpations: Abdomen is soft.  Musculoskeletal:     Cervical back: Normal range of motion.  Lymphadenopathy:     Cervical: No cervical adenopathy.  Skin:    General: Skin is warm and dry.     Capillary Refill: Capillary refill takes less than 2 seconds.  Neurological:     Mental Status: She is alert and oriented to person, place, and time.  Psychiatric:        Behavior: Behavior normal.      Musculoskeletal Exam: C-spine was in good range of motion.  Shoulder joints with good range of motion with discomfort.  She had good range of motion of her elbow joints and wrist joints.  She had tenderness on palpation of bilateral wrist joints over the ulnar region.  No MCP PIP or DIP swelling was noted.  She had good range of motion of bilateral hip joints.  She had incomplete extension of her right knee joint without any swelling.  Left knee joint is replaced.  She had no tenderness across the MTPs.  CDAI Exam: CDAI Score: -- Patient Global: --; Provider Global: -- Swollen: --; Tender: -- Joint Exam 07/26/2019   No joint exam has been documented for this visit   There is currently no information documented on the homunculus. Go to the Rheumatology activity and complete the homunculus joint exam.  Investigation: No additional findings.  Imaging: No results found.  Recent Labs: Lab Results  Component Value Date   WBC 6.2 05/19/2019   HGB 14.7 05/19/2019   PLT 248 05/19/2019   NA 140 05/19/2019   K 4.2 05/19/2019   CL 104 05/19/2019   CO2 28 05/19/2019   GLUCOSE 108 (H) 05/19/2019   BUN 17 05/19/2019   CREATININE 0.55 05/19/2019   BILITOT 0.4 05/19/2019   ALKPHOS 84 06/17/2018   AST 24 05/19/2019   ALT 23 05/19/2019   PROT 6.8 05/19/2019   ALBUMIN 4.3 06/17/2018   CALCIUM 9.4 05/19/2019   GFRAA 117 05/19/2019   QFTBGOLDPLUS NEGATIVE 02/16/2017    Speciality Comments: PLQ eye exam: 04/20/2019 normal. Dr.  04/22/2019. Follow up in 1 year.  Procedures:  No procedures performed Allergies:  Penicillins and Sulfa antibiotics   Assessment / Plan:     Visit Diagnoses: Inflammatory arthritis -patient continues to have pain and discomfort in multiple joints.  She states she could not tolerate Plaquenil twice daily Monday to Friday and reduce dose to 1 tablet p.o. daily.  She believes the dose is not effective and she would like to increase the dose of Plaquenil to 1 tablet twice daily Monday to Friday.  She has been having increased pain and swelling in her right knee joint.  She states she got to the point that she could not walk without a cane.  She was seen by Dr. Wyline Mood who injected her knee joint and also gave her a prednisone taper.  Her symptoms are better now.  She is planning to have right total knee replacement in October.  Plan: hydroxychloroquine (PLAQUENIL) 200 MG tablet p.o. twice daily Monday through Friday  High risk medication use - Plaquenil 200 mg po qd - Plan: CBC with Differential/Platelet, COMPLETE METABOLIC PANEL WITH GFR today.  Plaquenil eye examination April 20, 2019 was normal at Dr. Huel Coventry office  Pseudogout involving multiple joints - colchicine to 0.6 mg 1 capsule by mouth daily.  Patient still able to tolerate increased dose for flares due to GI side effects.  Primary osteoarthritis of both hands-joint protection was discussed.  Bilateral primary osteoarthritis of hip-she has some limitation but not much discomfort.  Primary osteoarthritis of right knee-she has limited extension of her right knee joint and discomfort.  She had recent cortisone injection and prednisone taper.  S/P total knee replacement, left-doing well.  She had surgery by Dr. Wyline Mood in 2020.  Primary osteoarthritis of both feet-she is currently not having much discomfort.  Spondylosis of lumbar region without myelopathy or radiculopathy -she has pain off and on.  Hyperuricemia-her uric acid has been  normal.  NAFLD (nonalcoholic fatty liver disease)  Small fiber neuropathy  Essential hypertension  h/o Hiatal hernia  Vitamin D deficiency  Primary insomnia  History of kidney stones  Orders: Orders Placed This Encounter  Procedures  . CBC with Differential/Platelet  . COMPLETE METABOLIC PANEL WITH GFR   Meds ordered this encounter  Medications  . hydroxychloroquine (PLAQUENIL) 200 MG tablet    Sig: 1 tablet p.o. twice daily Monday through Friday    Dispense:  120 tablet    Refill:  0     Follow-Up Instructions: Return in about 3 months (around 10/26/2019) for Inflammatory arthritis, CPPD.   Pollyann Savoy, MD  Note - This record has been created using [employer].  Chart creation errors have been sought, but may not always  have been located. Such creation errors do not reflect on  the standard of medical care.

## 2019-07-22 ENCOUNTER — Telehealth: Payer: Self-pay | Admitting: *Deleted

## 2019-07-22 ENCOUNTER — Other Ambulatory Visit: Payer: Self-pay | Admitting: Physician Assistant

## 2019-07-22 DIAGNOSIS — I10 Essential (primary) hypertension: Secondary | ICD-10-CM

## 2019-07-22 NOTE — Telephone Encounter (Signed)
   Primary Cardiologist: Dr. Ladona Ridgel  Chart reviewed as part of pre-operative protocol coverage. Patient was contacted 07/22/2019 in reference to pre-operative risk assessment for pending surgery as outlined below.  Marissa Larson was last seen on 08/27/18 by Gypsy Balsam NP. H/o fatigue, GERD, HTN, hypothyroidism, overweight, tachycardia, PVC, prior atypical CP. 2D echo 2015 with normal LVEF. RCRI 0.4% indicating low risk of CV complications. Anticipate patient can be cleared if doing well but reached out to discuss. Got VM - LMTCB.  [name], PA-C 07/22/2019, 4:54 PM

## 2019-07-22 NOTE — Telephone Encounter (Signed)
   [facility] Pre-operative Risk Assessment    HEARTCARE STAFF: - Please ensure there is not already an duplicate clearance open for this procedure. - Under Visit Info/Reason for Call, type in Other and utilize the format Clearance MM/DD/YY or Clearance TBD. Do not use dashes or single digits. - If request is for dental extraction, please clarify the # of teeth to be extracted.  Request for surgical clearance:  1. What type of surgery is being performed? Right total knee replacement   2. When is this surgery scheduled? TBD   3. What type of clearance is required (medical clearance vs. Pharmacy clearance to hold med vs. Both)? Medical  4. Are there any medications that need to be held prior to surgery and how long?None listed   5. Practice name and name of physician performing surgery? Marissa Larson; Marissa Larson   6. What is the office phone number? [phone] x [phone] Marissa Larson   7.   What is the office fax number? [place]   Anesthesia type (None, local, MAC, general) ? Choice   Marissa Larson 07/22/2019, 3:17 PM  _________________________________________________________________   (provider comments below)

## 2019-07-25 ENCOUNTER — Other Ambulatory Visit: Payer: Self-pay | Admitting: Rheumatology

## 2019-07-25 DIAGNOSIS — M1990 Unspecified osteoarthritis, unspecified site: Secondary | ICD-10-CM

## 2019-07-25 NOTE — Telephone Encounter (Addendum)
Last Visit: 03/02/2019 Next Visit:07/25/2019 Labs: 05/19/2019 CBC and CMP normal PLQ eye exam: 04/20/2019 normal.   Current Dose per office note on 2/24/202: colchicine to 0.6 mg 1 capsule by mouth daily  . Per telephone encounter on 04/14/2019, It is okay for her to stay on Plaquenil 1 tablet daily. If she cannot tolerate Plaquenil 1 tablet daily then she may discontinue it.  Okay to refill per Dr. Corliss Skains

## 2019-07-26 ENCOUNTER — Encounter: Payer: Self-pay | Admitting: Physician Assistant

## 2019-07-26 ENCOUNTER — Other Ambulatory Visit: Payer: Self-pay

## 2019-07-26 ENCOUNTER — Ambulatory Visit (INDEPENDENT_AMBULATORY_CARE_PROVIDER_SITE_OTHER): Payer: BC Managed Care – PPO | Admitting: Rheumatology

## 2019-07-26 VITALS — BP 141/79 | HR 76 | Resp 16 | Ht 66.0 in | Wt 198.6 lb

## 2019-07-26 DIAGNOSIS — G629 Polyneuropathy, unspecified: Secondary | ICD-10-CM

## 2019-07-26 DIAGNOSIS — E559 Vitamin D deficiency, unspecified: Secondary | ICD-10-CM

## 2019-07-26 DIAGNOSIS — E790 Hyperuricemia without signs of inflammatory arthritis and tophaceous disease: Secondary | ICD-10-CM

## 2019-07-26 DIAGNOSIS — M160 Bilateral primary osteoarthritis of hip: Secondary | ICD-10-CM

## 2019-07-26 DIAGNOSIS — M1990 Unspecified osteoarthritis, unspecified site: Secondary | ICD-10-CM

## 2019-07-26 DIAGNOSIS — F5101 Primary insomnia: Secondary | ICD-10-CM

## 2019-07-26 DIAGNOSIS — Z87442 Personal history of urinary calculi: Secondary | ICD-10-CM

## 2019-07-26 DIAGNOSIS — I10 Essential (primary) hypertension: Secondary | ICD-10-CM

## 2019-07-26 DIAGNOSIS — K449 Diaphragmatic hernia without obstruction or gangrene: Secondary | ICD-10-CM

## 2019-07-26 DIAGNOSIS — M19071 Primary osteoarthritis, right ankle and foot: Secondary | ICD-10-CM

## 2019-07-26 DIAGNOSIS — K760 Fatty (change of) liver, not elsewhere classified: Secondary | ICD-10-CM

## 2019-07-26 DIAGNOSIS — M1189 Other specified crystal arthropathies, multiple sites: Secondary | ICD-10-CM | POA: Diagnosis not present

## 2019-07-26 DIAGNOSIS — Z79899 Other long term (current) drug therapy: Secondary | ICD-10-CM | POA: Diagnosis not present

## 2019-07-26 DIAGNOSIS — M47816 Spondylosis without myelopathy or radiculopathy, lumbar region: Secondary | ICD-10-CM

## 2019-07-26 DIAGNOSIS — M19041 Primary osteoarthritis, right hand: Secondary | ICD-10-CM

## 2019-07-26 DIAGNOSIS — Z96652 Presence of left artificial knee joint: Secondary | ICD-10-CM

## 2019-07-26 DIAGNOSIS — M19042 Primary osteoarthritis, left hand: Secondary | ICD-10-CM

## 2019-07-26 DIAGNOSIS — M19072 Primary osteoarthritis, left ankle and foot: Secondary | ICD-10-CM

## 2019-07-26 DIAGNOSIS — M1711 Unilateral primary osteoarthritis, right knee: Secondary | ICD-10-CM

## 2019-07-26 MED ORDER — HYDROXYCHLOROQUINE SULFATE 200 MG PO TABS: ORAL_TABLET | ORAL | 0 refills | Status: DC

## 2019-07-26 NOTE — Telephone Encounter (Signed)
The patient's surgery is not until October.  She actually asked if she could be seen prior to her surgery.  She requested [facility] or Dr Ladona Ridgel.  I'll ask that she be contacted for an appointment for pre op clearance.  Corine Shelter PA-C 07/26/2019 3:26 PM

## 2019-07-26 NOTE — Telephone Encounter (Signed)
Pt is scheduled to see Gypsy Balsam, NP, 09/22/19.  Will route back to the requesting surgeon's office to make them aware clearance will be addressed at that time.

## 2019-07-26 NOTE — Patient Instructions (Signed)
Please increase the dose of Plaquenil 200 mg twice a day Monday through Friday.

## 2019-07-26 NOTE — Telephone Encounter (Signed)
Patient is returning call.  °

## 2019-07-27 ENCOUNTER — Encounter: Payer: Self-pay | Admitting: Medical-Surgical

## 2019-07-27 ENCOUNTER — Telehealth (INDEPENDENT_AMBULATORY_CARE_PROVIDER_SITE_OTHER): Payer: BC Managed Care – PPO | Admitting: Medical-Surgical

## 2019-07-27 ENCOUNTER — Other Ambulatory Visit: Payer: Self-pay

## 2019-07-27 VITALS — BP 141/79 | Ht 66.0 in | Wt 195.0 lb

## 2019-07-27 DIAGNOSIS — J069 Acute upper respiratory infection, unspecified: Secondary | ICD-10-CM

## 2019-07-27 LAB — CBC WITH DIFFERENTIAL/PLATELET
Absolute Monocytes: 567 cells/uL (ref 200–950)
Basophils Absolute: 69 cells/uL (ref 0–200)
Basophils Relative: 1.1 %
Eosinophils Absolute: 88 cells/uL (ref 15–500)
Eosinophils Relative: 1.4 %
HCT: 42.7 % (ref 35.0–45.0)
Hemoglobin: 14.3 g/dL (ref 11.7–15.5)
Lymphs Abs: 1707 cells/uL (ref 850–3900)
MCH: 32.1 pg (ref 27.0–33.0)
MCHC: 33.5 g/dL (ref 32.0–36.0)
MCV: 95.7 fL (ref 80.0–100.0)
MPV: 10.4 fL (ref 7.5–12.5)
Monocytes Relative: 9 %
Neutro Abs: 3868 cells/uL (ref 1500–7800)
Neutrophils Relative %: 61.4 %
Platelets: 214 10*3/uL (ref 140–400)
RBC: 4.46 10*6/uL (ref 3.80–5.10)
RDW: 13.5 % (ref 11.0–15.0)
Total Lymphocyte: 27.1 %
WBC: 6.3 10*3/uL (ref 3.8–10.8)

## 2019-07-27 LAB — COMPLETE METABOLIC PANEL WITH GFR
AG Ratio: 1.9 (calc) (ref 1.0–2.5)
ALT: 20 U/L (ref 6–29)
AST: 20 U/L (ref 10–35)
Albumin: 4.3 g/dL (ref 3.6–5.1)
Alkaline phosphatase (APISO): 69 U/L (ref 37–153)
BUN: 16 mg/dL (ref 7–25)
CO2: 25 mmol/L (ref 20–32)
Calcium: 9.5 mg/dL (ref 8.6–10.4)
Chloride: 105 mmol/L (ref 98–110)
Creat: 0.54 mg/dL (ref 0.50–0.99)
GFR, Est African American: 117 mL/min/{1.73_m2} (ref >=60)
GFR, Est Non African American: 101 mL/min/{1.73_m2} (ref >=60)
Globulin: 2.3 g/dL (calc) (ref 1.9–3.7)
Glucose, Bld: 88 mg/dL (ref 65–99)
Potassium: 4.2 mmol/L (ref 3.5–5.3)
Sodium: 140 mmol/L (ref 135–146)
Total Bilirubin: 0.5 mg/dL (ref 0.2–1.2)
Total Protein: 6.6 g/dL (ref 6.1–8.1)

## 2019-07-27 MED ORDER — IPRATROPIUM BROMIDE 0.03 % NA SOLN: 2.0000 | Freq: Two times a day (BID) | NASAL | 0 refills | Status: DC

## 2019-07-27 NOTE — Progress Notes (Signed)
Virtual Visit via Telephone   I connected with  Tilden Dome  on 07/27/19 by telephone/telehealth and verified that I am speaking with the correct person using two identifiers.   I discussed the limitations, risks, security and privacy concerns of performing an evaluation and management service by telephone, including the higher likelihood of inaccurate diagnosis and treatment, and the availability of in person appointments.  We also discussed the likely need of an additional face to face encounter for complete and high quality delivery of care.  I also discussed with the patient that there may be a patient responsible charge related to this service. The patient expressed understanding and wishes to proceed.  Provider location is in medical facility. Patient location is at their home, different from provider location. People involved in care of the patient during this telehealth encounter were myself, my nurse/medical assistant, and my front office/scheduling team member.  CC: Fatigue/lethargy  HPI: Pleasant 64 year old female presenting via telephone visit with reports of feeling rundown/lethargic for several days.  Notes that her lymph nodes were swollen for about 5 days under both arms.  This has since improved but she awoke this morning with upper respiratory symptoms.  She did see rheumatology yesterday who urged her to contact her primary care to touch base regarding the swollen lymph nodes and her current symptoms.  She reports waking with sinus congestion, rhinorrhea, ear pressure/pain.  Denies fever, chills, GI symptoms.  Has not taking any medications so far.  Was recently treated with prednisone and takes Plaquenil daily.  Notes her husband was sick a few weeks ago with upper respiratory symptoms.  Review of Systems: See HPI for pertinent positives and negatives.   Objective Findings:    General: Speaking full sentences, no audible heavy breathing.  Sounds alert and appropriately  interactive.    Independent interpretation of tests performed by another provider:   None.  Impression, and Recommendations:    1. Viral URI Fatigue and swollen lymph nodes likely her body's response to a viral infection.  With her onset of respiratory symptoms suspect this is a garden-variety cold.  Recommend symptomatic treatment at home.  Upper respiratory OTC shopping list sent to patient via MyChart message.  Sending in Atrovent to help manage rhinorrhea.  Advised patient to monitor blood pressures as some cold medications can cause an increase.  If she should develop cough that is resistant to over-the-counter measures, advised patient to contact me and I will call in something to her pharmacy.  In the meantime should her symptoms drastically worsen, advised her to go to urgent care over the weekend.  If no improvement by Monday, we may need to initiate empiric treatment for sinusitis.  Patient will contact me and let me know. - ipratropium (ATROVENT) 0.03 % nasal spray; Place 2 sprays into both nostrils every 12 (twelve) hours.  Dispense: 30 mL; Refill: 0  I discussed the above assessment and treatment plan with the patient. The patient was provided an opportunity to ask questions and all were answered. The patient agreed with the plan and demonstrated an understanding of the instructions.   The patient was advised to call back or seek an in-person evaluation if the symptoms worsen or if the condition fails to improve as anticipated.   Return if symptoms worsen or fail to improve.  20 minutes of non-face-to-face time was provided during this encounter.  Thayer Ohm, DNP, APRN, FNP-BC [facility]

## 2019-08-08 ENCOUNTER — Ambulatory Visit: Payer: BC Managed Care – PPO | Admitting: Podiatry

## 2019-08-15 ENCOUNTER — Other Ambulatory Visit: Payer: BC Managed Care – PPO

## 2019-08-15 ENCOUNTER — Other Ambulatory Visit: Payer: Self-pay

## 2019-08-15 DIAGNOSIS — R7989 Other specified abnormal findings of blood chemistry: Secondary | ICD-10-CM

## 2019-08-15 DIAGNOSIS — R7303 Prediabetes: Secondary | ICD-10-CM

## 2019-08-15 DIAGNOSIS — Z0000 Encounter for general adult medical examination without abnormal findings: Secondary | ICD-10-CM

## 2019-08-15 DIAGNOSIS — E7889 Other lipoprotein metabolism disorders: Secondary | ICD-10-CM

## 2019-08-15 DIAGNOSIS — E039 Hypothyroidism, unspecified: Secondary | ICD-10-CM

## 2019-08-15 DIAGNOSIS — E559 Vitamin D deficiency, unspecified: Secondary | ICD-10-CM

## 2019-08-15 DIAGNOSIS — I10 Essential (primary) hypertension: Secondary | ICD-10-CM

## 2019-08-17 ENCOUNTER — Other Ambulatory Visit: Payer: BC Managed Care – PPO

## 2019-08-17 ENCOUNTER — Other Ambulatory Visit: Payer: Self-pay

## 2019-08-17 DIAGNOSIS — I10 Essential (primary) hypertension: Secondary | ICD-10-CM | POA: Diagnosis not present

## 2019-08-17 DIAGNOSIS — E039 Hypothyroidism, unspecified: Secondary | ICD-10-CM | POA: Diagnosis not present

## 2019-08-17 DIAGNOSIS — R7303 Prediabetes: Secondary | ICD-10-CM | POA: Diagnosis not present

## 2019-08-17 DIAGNOSIS — R7989 Other specified abnormal findings of blood chemistry: Secondary | ICD-10-CM | POA: Diagnosis not present

## 2019-08-17 DIAGNOSIS — E7889 Other lipoprotein metabolism disorders: Secondary | ICD-10-CM

## 2019-08-17 DIAGNOSIS — E559 Vitamin D deficiency, unspecified: Secondary | ICD-10-CM

## 2019-08-17 DIAGNOSIS — Z0000 Encounter for general adult medical examination without abnormal findings: Secondary | ICD-10-CM

## 2019-08-18 LAB — LIPID PANEL
Chol/HDL Ratio: 2.5 ratio (ref 0.0–4.4)
Cholesterol, Total: 236 mg/dL — ABNORMAL HIGH (ref 100–199)
HDL: 93 mg/dL (ref >39)
LDL Chol Calc (NIH): 120 mg/dL — ABNORMAL HIGH (ref 0–99)
Triglycerides: 138 mg/dL (ref 0–149)
VLDL Cholesterol Cal: 23 mg/dL (ref 5–40)

## 2019-08-18 LAB — COMPREHENSIVE METABOLIC PANEL
ALT: 25 IU/L (ref 0–32)
AST: 27 IU/L (ref 0–40)
Albumin/Globulin Ratio: 2.1 (ref 1.2–2.2)
Albumin: 4.7 g/dL (ref 3.8–4.8)
Alkaline Phosphatase: 92 IU/L (ref 48–121)
BUN/Creatinine Ratio: 22 (ref 12–28)
BUN: 15 mg/dL (ref 8–27)
Bilirubin Total: 0.3 mg/dL (ref 0.0–1.2)
CO2: 22 mmol/L (ref 20–29)
Calcium: 9.6 mg/dL (ref 8.7–10.3)
Chloride: 105 mmol/L (ref 96–106)
Creatinine, Ser: 0.68 mg/dL (ref 0.57–1.00)
GFR calc Af Amer: 108 mL/min/{1.73_m2} (ref >59)
GFR calc non Af Amer: 94 mL/min/{1.73_m2} (ref >59)
Globulin, Total: 2.2 g/dL (ref 1.5–4.5)
Glucose: 101 mg/dL — ABNORMAL HIGH (ref 65–99)
Potassium: 4.6 mmol/L (ref 3.5–5.2)
Sodium: 142 mmol/L (ref 134–144)
Total Protein: 6.9 g/dL (ref 6.0–8.5)

## 2019-08-18 LAB — CBC WITH DIFFERENTIAL/PLATELET
Basophils Absolute: 0.1 10*3/uL (ref 0.0–0.2)
Basos: 1 %
EOS (ABSOLUTE): 0.1 10*3/uL (ref 0.0–0.4)
Eos: 2 %
Hematocrit: 44.9 % (ref 34.0–46.6)
Hemoglobin: 15.3 g/dL (ref 11.1–15.9)
Immature Grans (Abs): 0 10*3/uL (ref 0.0–0.1)
Immature Granulocytes: 0 %
Lymphocytes Absolute: 2.3 10*3/uL (ref 0.7–3.1)
Lymphs: 34 %
MCH: 32.4 pg (ref 26.6–33.0)
MCHC: 34.1 g/dL (ref 31.5–35.7)
MCV: 95 fL (ref 79–97)
Monocytes Absolute: 0.6 10*3/uL (ref 0.1–0.9)
Monocytes: 8 %
Neutrophils Absolute: 3.6 10*3/uL (ref 1.4–7.0)
Neutrophils: 55 %
Platelets: 235 10*3/uL (ref 150–450)
RBC: 4.72 x10E6/uL (ref 3.77–5.28)
RDW: 13.5 % (ref 11.7–15.4)
WBC: 6.6 10*3/uL (ref 3.4–10.8)

## 2019-08-18 LAB — HEMOGLOBIN A1C
Est. average glucose Bld gHb Est-mCnc: 111 mg/dL
Hgb A1c MFr Bld: 5.5 % (ref 4.8–5.6)

## 2019-08-18 LAB — TSH: TSH: 1.3 u[IU]/mL (ref 0.450–4.500)

## 2019-08-18 LAB — VITAMIN D 25 HYDROXY (VIT D DEFICIENCY, FRACTURES): Vit D, 25-Hydroxy: 36.7 ng/mL (ref 30.0–100.0)

## 2019-08-24 ENCOUNTER — Encounter: Payer: BC Managed Care – PPO | Admitting: Physician Assistant

## 2019-08-25 ENCOUNTER — Other Ambulatory Visit: Payer: Self-pay | Admitting: Medical-Surgical

## 2019-08-25 DIAGNOSIS — J069 Acute upper respiratory infection, unspecified: Secondary | ICD-10-CM

## 2019-08-25 NOTE — Telephone Encounter (Signed)
Routing to PCP

## 2019-08-26 NOTE — Telephone Encounter (Signed)
Please call Ms. Machamer and inquire if symptomatic. Atrovent nasal spray was prescribed by Joy in July for viral URI.  Thank you, Kandis Cocking

## 2019-08-29 ENCOUNTER — Other Ambulatory Visit: Payer: Self-pay | Admitting: *Deleted

## 2019-08-29 DIAGNOSIS — M1990 Unspecified osteoarthritis, unspecified site: Secondary | ICD-10-CM

## 2019-08-29 MED ORDER — HYDROXYCHLOROQUINE SULFATE 200 MG PO TABS: ORAL_TABLET | ORAL | 0 refills | Status: DC

## 2019-08-29 NOTE — Telephone Encounter (Signed)
Left message for patient to call back. AS, CMA 

## 2019-08-29 NOTE — Telephone Encounter (Signed)
Last Visit: 07/26/2019 Next Visit:10/10/2019 Labs: 08/17/2019 Glucose 101 rest of CMP and CBC WNL Eye exam:  04/20/2019 normal.  Current Dose per office note 07/26/2019: could not tolerate Plaquenil twice daily Monday to Friday and reduce dose to 1 tablet p.o. daily.  She believes the dose is not effective and she would like to increase the dose of Plaquenil to 1 tablet twice daily Monday to Friday.  DX: Inflammatory arthritis   Okay to refill Plaquenil?

## 2019-09-05 DIAGNOSIS — M19071 Primary osteoarthritis, right ankle and foot: Secondary | ICD-10-CM | POA: Diagnosis not present

## 2019-09-09 DIAGNOSIS — M79671 Pain in right foot: Secondary | ICD-10-CM | POA: Diagnosis not present

## 2019-09-22 ENCOUNTER — Other Ambulatory Visit: Payer: Self-pay | Admitting: Family Medicine

## 2019-09-22 ENCOUNTER — Ambulatory Visit: Payer: BC Managed Care – PPO | Admitting: Nurse Practitioner

## 2019-09-22 DIAGNOSIS — E559 Vitamin D deficiency, unspecified: Secondary | ICD-10-CM

## 2019-09-26 NOTE — Progress Notes (Deleted)
Office Visit Note  Patient: Marissa Larson             Date of Birth: [birth year]           MRN: [id number]             PCP: Mayer Masker, PA-C Referring: Mayer Masker, PA-C Visit Date: 10/10/2019 Occupation: @GUAROCC @  Subjective:  No chief complaint on file.   History of Present Illness: Marissa Larson is a 64 y.o. female ***   Activities of Daily Living:  Patient reports morning stiffness for *** {minute/hour:19697}.   Patient {ACTIONS;DENIES/REPORTS:21021675::"Denies"} nocturnal pain.  Difficulty dressing/grooming: {ACTIONS;DENIES/REPORTS:21021675::"Denies"} Difficulty climbing stairs: {ACTIONS;DENIES/REPORTS:21021675::"Denies"} Difficulty getting out of chair: {ACTIONS;DENIES/REPORTS:21021675::"Denies"} Difficulty using hands for taps, buttons, cutlery, and/or writing: {ACTIONS;DENIES/REPORTS:21021675::"Denies"}  No Rheumatology ROS completed.   PMFS History:  Patient Active Problem List   Diagnosis Date Noted  . Elevated LDL cholesterol level 07/19/2018  . Prediabetes 07/19/2018  . Paresthesia 12/09/2017  . Chronic insomnia 11/24/2017  . Insomnia secondary to chronic pain 11/24/2017  . Complaint related to dreams 11/24/2017  . 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 Neuropathy 08/18/2017  . 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 Obesity, Class I, BMI 30-34.9 12/03/2016  . Kidney stones 09/12/2016  . Nausea 09/12/2016  . High risk medication use 07/30/2016  . NAFLD (nonalcoholic fatty liver disease) 08/01/2016  . Primary osteoarthritis of both feet 04/12/2016  . History of kidney stones 04/12/2016  . Primary osteoarthritis of both hands 04/11/2016  . Bilateral primary osteoarthritis of hip 04/11/2016  . Primary osteoarthritis of both knees 04/11/2016  . Spondylosis of lumbar region without myelopathy or radiculopathy 04/11/2016  . Hyperuricemia 04/11/2016    . Primary insomnia 04/11/2016  . Flank pain 04/02/2016  . Sinusitis 04/02/2016  . Elevated HDL 11/11/2015  . Elevated LFTs 11/11/2015  . Adjustment disorder with mixed anxiety and depressed mood 11/11/2015  . Dysuria 11/11/2015  . Hypothyroidism 10/14/2015  . Pseudogout involving multiple joints 10/14/2015  . Vitamin D deficiency 10/14/2015  . Hormone replacement therapy- per GYN 10/14/2015  . HTN (hypertension) 10/14/2015  . Fatigue 10/14/2015  . h/o Hiatal hernia 10/14/2015  . Sleep difficulties 10/14/2015  . Generalized OA 10/14/2015  . Counseling on health promotion and disease prevention 10/14/2015  . 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 Age-related nuclear cataract of both eyes 01/25/2015  . Posterior vitreous detachment of left eye 01/25/2015  . chronic Palpitations 07/19/2013    Past Medical History:  Diagnosis Date  . Arthritis   . Chest pain 06/2013   Left sided  . Chronic leg pain 06/2013  . Complication of anesthesia   . Dysrhythmia   . Fatigue   . GERD (gastroesophageal reflux disease)   . History of kidney stones   . Hypertension   . Hypothyroidism   . Nonproductive cough 06/2013  . Overweight 01/18/2014  . Peripheral neuropathy   . PONV (postoperative nausea and vomiting)   . Sleep difficulties    Change in sleep patterns  . SOB (shortness of breath) 06/2013  . Tachycardia 06/2013  . Thyroid disease    hypo  . UTI (lower urinary tract infection)   . Vesico-ureteric reflux     Family History  Problem Relation Age of Onset  . Migraines Other        Fam Hx   . Heart disease Other  Fam Hx  . Thyroid disease Other        Fam Hx - She also has thyroid disease  . Diabetes Other        Fam Hx  . Arthritis Other        Fam Hx  . Gallbladder disease Other        Fam Hx  . Cancer Other        Fam Hx - Pancreatic  . Hypertension Other        Fam Hx  . Heart attack Cousin        Multiple cousins with MI's before 31 years of age  . Heart  disease Mother 47  . Hyperlipidemia Mother 65  . Hypertension Mother 31  . Stroke Mother 74  . Heart disease Maternal Grandmother   . Cancer Father 26       pancreatic  . Diabetes Brother 50  . Heart attack Maternal Uncle   . Heart attack Paternal Aunt    Past Surgical History:  Procedure Laterality Date  . ABDOMINAL SURGERY  1994  . CYSTOSCOPY/URETEROSCOPY/HOLMIUM LASER/STENT PLACEMENT Left 09/22/2017   Procedure: CYSTOSCOPY/RETROGRADE/URETEROSCOPY/ BASKET STONE EXTRACTION/STENT PLACEMENT;  Surgeon: Rene Paci, MD;  Location: [facility];  Service: Urology;  Laterality: Left;  ONLY NEEDS 60 MIN  . FOOT SURGERY Right 2009  . KIDNEY SURGERY Left 1993  . KNEE SURGERY     Multiple knee surgeries  . OOPHORECTOMY Left 1994  . 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 Thurston Hole  . small fiber neuropathy biopsy  12/2017  . TONSILLECTOMY    . URETEROSCOPY VIA URETEROSTOMY Left 1994   Social History   Social History Narrative  . Not on file   Immunization History  Administered Date(s) Administered  . Tdap 01/07/2011     Objective: Vital Signs: There were no vitals taken for this visit.   Physical Exam   Musculoskeletal Exam: ***  CDAI Exam: CDAI Score: -- Patient Global: --; Provider Global: -- Swollen: --; Tender: -- Joint Exam 10/10/2019   No joint exam has been documented for this visit   There is currently no information documented on the homunculus. Go to the Rheumatology activity and complete the homunculus joint exam.  Investigation: No additional findings.  Imaging: No results found.  Recent Labs: Lab Results  Component Value Date   WBC 6.6 08/17/2019   HGB 15.3 08/17/2019   PLT 235 08/17/2019   NA 142 08/17/2019   K 4.6 08/17/2019   CL 105 08/17/2019   CO2 22 08/17/2019   GLUCOSE 101 (H) 08/17/2019   BUN 15 08/17/2019   CREATININE 0.68 08/17/2019   BILITOT 0.3 08/17/2019   ALKPHOS 92 08/17/2019   AST 27 08/17/2019   ALT 25  08/17/2019   PROT 6.9 08/17/2019   ALBUMIN 4.7 08/17/2019   CALCIUM 9.6 08/17/2019   GFRAA 108 08/17/2019   QFTBGOLDPLUS NEGATIVE 02/16/2017    Speciality Comments: PLQ eye exam: 04/20/2019 normal. Dr. Burgess Estelle. Follow up in 1 year.  Procedures:  No procedures performed Allergies: Penicillins and Sulfa antibiotics   Assessment / Plan:     Visit Diagnoses: No diagnosis found.  Orders: No orders of the defined types were placed in this encounter.  No orders of the defined types were placed in this encounter.   Face-to-face time spent with patient was *** minutes. Greater than 50% of time was spent in counseling and coordination of care.  Follow-Up Instructions: No follow-ups on file.   Orie Fisherman  Quita Skye, PA-C  Note - This record has been created using [employer].  Chart creation errors have been sought, but may not always  have been located. Such creation errors do not reflect on  the standard of medical care.

## 2019-09-27 ENCOUNTER — Ambulatory Visit: Payer: BC Managed Care – PPO | Admitting: Podiatry

## 2019-10-03 ENCOUNTER — Other Ambulatory Visit: Payer: Self-pay

## 2019-10-03 ENCOUNTER — Ambulatory Visit (INDEPENDENT_AMBULATORY_CARE_PROVIDER_SITE_OTHER): Payer: BC Managed Care – PPO | Admitting: Physician Assistant

## 2019-10-03 ENCOUNTER — Encounter: Payer: Self-pay | Admitting: Physician Assistant

## 2019-10-03 DIAGNOSIS — E559 Vitamin D deficiency, unspecified: Secondary | ICD-10-CM

## 2019-10-03 DIAGNOSIS — Z01812 Encounter for preprocedural laboratory examination: Secondary | ICD-10-CM | POA: Diagnosis not present

## 2019-10-03 DIAGNOSIS — M1711 Unilateral primary osteoarthritis, right knee: Secondary | ICD-10-CM | POA: Diagnosis not present

## 2019-10-03 DIAGNOSIS — M25561 Pain in right knee: Secondary | ICD-10-CM | POA: Diagnosis not present

## 2019-10-03 MED ORDER — VITAMIN D (ERGOCALCIFEROL) 1.25 MG (50000 UNIT) PO CAPS: 50000.0000 [IU] | ORAL_CAPSULE | ORAL | 1 refills | Status: DC

## 2019-10-03 NOTE — Patient Instructions (Signed)

## 2019-10-03 NOTE — Progress Notes (Signed)
Female Physical   Impression and Recommendations:    1. Vitamin D deficiency      1) Anticipatory Guidance: Discussed skin CA prevention and sunscreen when outside along with skin surveillance; eating a balanced and modest diet; physical activity at least 25 minutes per day or minimum of 150 min/ week moderate to intense activity.  2) Immunizations / Screenings / Labs:   All immunizations are up-to-date per recommendations or will be updated today if pt allows.    - Patient understands with dental and vision screens they will schedule independently.  - Obtained CBC, CMP, HgA1c, Lipid panel, TSH and vit D when fasting.  Most labs essentially within normal limits or stable from prior.  Lipid panel: Total cholesterol and LDL mildly elevated-patient has not been as active due to severe right knee osteoarthritis which she is planning to have a total knee replacement October 27 with [facility].  Also reports recently changing diet to plant-based. - UTD on colonoscopy, Tdap, and pap smear. - Obtains mammogram with Ob-Gyn ([facility]) and plans to schedule her annual visit before her surgery. - Declined Shingrix vaccine.  3) Weight:  BMI meaning discussed with patient.  Discussed goal to improve diet habits to improve overall feelings of well being and objective health data. Improve nutrient density of diet through increasing intake of fruits and vegetables and decreasing saturated fats, white flour products and refined sugars.  4) Healthcare Maintenance: -Continue current medication regimen. Provided requested refills. -Follow-up with cardiology as scheduled for surgical clearance. -BP elevated in the office today, BP recheck improved but still above goal.  Asymptomatic.  Reports ambulatory BP readings have been 120-130s/high 70s to 80s.  Advised to continue with ambulatory BP and pulse monitoring and to return next week for blood pressure recheck. -Follow a heart healthy  diet. Plan to recheck lipid panel next OV and if it continues to be elevated despite dietary and lifestyle changes will consider starting medication therapy.  The 10-year ASCVD risk score Denman George(Goff DC Montez HagemanJr., et al., 2013) is: 6%   Values used to calculate the score:     Age: [age] years     Sex: Female     Is Non-Hispanic African American: No     Diabetic: No     Tobacco smoker: No     Systolic Blood Pressure: 148 mmHg     Is BP treated: Yes     HDL Cholesterol: 93 mg/dL     Total Cholesterol: 236 mg/dL -Follow up in 3 months for reg OV: HTN, HLD and FBW (lipid panel, cmp)  Meds ordered this encounter  Medications   Vitamin D, Ergocalciferol, (DRISDOL) 1.25 MG (50000 UNIT) CAPS capsule    Sig: Take 1 capsule (50,000 Units total) by mouth every 7 (seven) days.    Dispense:  12 capsule    Refill:  1    No orders of the defined types were placed in this encounter.    Return in about 3 months (around 01/02/2020) for HTN, Thyroid, recheck lipid panel.      Gross side effects, risk and benefits, and alternatives of medications discussed with patient.  Patient is aware that all medications have potential side effects and we are unable to predict every side effect or drug-drug interaction that may occur.  Expresses verbal understanding and consents to current therapy plan and treatment regimen.  F-up preventative CPE in 1 year-this is in addition to any chronic care visits.    Please see orders placed and  AVS handed out to patient at the end of our visit for further patient instructions/ counseling done pertaining to today's office visit.  Note:  This note was prepared with assistance of Dragon voice recognition software. Occasional wrong-word or sound-a-like substitutions may have occurred due to the inherent limitations of voice recognition software.    Subjective:     CPE HPI: Marissa Larson is a 64 y.o. female who presents to [facility] today for a yearly  health maintenance exam.   Health Maintenance Summary  - Reviewed and updated, unless pt declines services.  Last Cologuard or Colonoscopy:    02/12/2015- repeat in 10 years Tobacco History Reviewed:  Y, never smoker Alcohol and/or drug use:    No concerns; no use Exercise Habits: Not currently due to right knee pain Dental Home: Needs to get established Eye exams: Y Dermatology home: Y  Female Health:  PAP Smear - last known results:  10/22/2016- negative, followed by Ob-Gyn STD concerns:   none Lumps or breast concerns:   none      Immunization History  Administered Date(s) Administered   Tdap 01/07/2011     Health Maintenance  Topic Date Due   PAP SMEAR-Modifier  10/23/2019   COVID-19 Vaccine (1) 10/19/2019 (Originally 06/20/1967)   INFLUENZA VACCINE  04/05/2020 (Originally 08/07/2019)   HIV Screening  10/02/2020 (Originally 06/20/1970)   MAMMOGRAM  11/24/2019   TETANUS/TDAP  01/06/2021   COLONOSCOPY  02/11/2025   Hepatitis C Screening  Completed     Wt Readings from Last 3 Encounters:  10/03/19 201 lb 11.2 oz (91.5 kg)  07/27/19 195 lb (88.5 kg)  07/26/19 198 lb 9.6 oz (90.1 kg)   BP Readings from Last 3 Encounters:  10/03/19 (!) 148/92  07/27/19 (!) 141/79  07/26/19 (!) 141/79   Pulse Readings from Last 3 Encounters:  10/03/19 80  07/26/19 76  05/25/19 71     Past Medical History:  Diagnosis Date   Arthritis    Chest pain 06/2013   Left sided   Chronic leg pain 06/2013   Complication of anesthesia    Dysrhythmia    Fatigue    GERD (gastroesophageal reflux disease)    History of kidney stones    Hypertension    Hypothyroidism    Nonproductive cough 06/2013   Overweight 01/18/2014   Peripheral neuropathy    PONV (postoperative nausea and vomiting)    Sleep difficulties    Change in sleep patterns   SOB (shortness of breath) 06/2013   Tachycardia 06/2013   Thyroid disease    hypo   UTI (lower urinary tract infection)     Vesico-ureteric reflux       Past Surgical History:  Procedure Laterality Date   ABDOMINAL SURGERY  1994   CYSTOSCOPY/URETEROSCOPY/HOLMIUM LASER/STENT PLACEMENT Left 09/22/2017   Procedure: CYSTOSCOPY/RETROGRADE/URETEROSCOPY/ BASKET STONE EXTRACTION/STENT PLACEMENT;  Surgeon: Rene Paci, MD;  Location: [facility];  Service: Urology;  Laterality: Left;  ONLY NEEDS 60 MIN   FOOT SURGERY Right 2009   KIDNEY SURGERY Left 1993   KNEE SURGERY     Multiple knee surgeries   OOPHORECTOMY Left 1994   REPLACEMENT TOTAL KNEE Left 10/13/2018   Dr. Thurston Hole   small fiber neuropathy biopsy  12/2017   TONSILLECTOMY     URETEROSCOPY VIA URETEROSTOMY Left 1994      Family History  Problem Relation Age of Onset   Migraines Other        Fam Hx  Heart disease Other        Fam Hx   Thyroid disease Other        Fam Hx - She also has thyroid disease   Diabetes Other        Fam Hx   Arthritis Other        Fam Hx   Gallbladder disease Other        Fam Hx   Cancer Other        Fam Hx - Pancreatic   Hypertension Other        Fam Hx   Heart attack Cousin        Multiple cousins with MI's before 73 years of age   Heart disease Mother 18   Hyperlipidemia Mother 39   Hypertension Mother 85   Stroke Mother 7   Heart disease Maternal Grandmother    Cancer Father 89       pancreatic   Diabetes Brother 50   Heart attack Maternal Uncle    Heart attack Paternal Aunt       Social History   Substance and Sexual Activity  Drug Use No  ,   Social History   Substance and Sexual Activity  Alcohol Use Yes   Alcohol/week: 1.0 - 2.0 standard drink   Types: 1 - 2 Glasses of wine per week   Comment: Twice a week  ,   Social History   Tobacco Use  Smoking Status Never Smoker  Smokeless Tobacco Never Used  ,   Social History   Substance and Sexual Activity  Sexual Activity Yes   Birth control/protection: None    Current Outpatient  Medications on File Prior to Visit  Medication Sig Dispense Refill   Cholecalciferol (VITAMIN D) 50 MCG (2000 UT) CAPS 2,000 daily OTC in addition to once weekly prescription 30 capsule    CREAM BASE EX Apply 1 application topically daily. BIEST Transdermal Cream 0.5 mg: Compounded bio-identical Estriol (E3) combined by [facility]     diclofenac (VOLTAREN) 75 MG EC tablet Take 75 mg by mouth daily as needed.     escitalopram (LEXAPRO) 10 MG tablet Take 1 tablet (10 mg total) by mouth at bedtime. 90 tablet 1   eszopiclone (LUNESTA) 1 MG TABS tablet Take 1 mg by mouth at bedtime as needed.     famotidine (PEPCID) 40 MG tablet Take 40 mg by mouth daily as needed (for heartburn/indigestion.).   2   Flaxseed, Linseed, (FLAX SEED OIL) 1000 MG CAPS Take 2,000 mg by mouth every evening.     hydrochlorothiazide (HYDRODIURIL) 12.5 MG tablet TAKE 1 TABLET BY MOUTH DAILY. 30 tablet 1   hydroxychloroquine (PLAQUENIL) 200 MG tablet 1 tablet p.o. twice daily Monday through Friday 120 tablet 0   ibuprofen (ADVIL,MOTRIN) 200 MG tablet Take 400 mg by mouth every 8 (eight) hours as needed (for pain.).     Magnesium 500 MG CAPS Take 1,000 mg by mouth every evening.     Misc Natural Products (TART CHERRY ADVANCED PO) Take 2,000 mg by mouth daily. 1000 mg per capsule     MITIGARE 0.6 MG CAPS TAKE 1 CAPSULE BY MOUTH DAILY. 90 capsule 0   nitrofurantoin, macrocrystal-monohydrate, (MACROBID) 100 MG capsule Take by mouth daily as needed. UTI prevention     NONFORMULARY OR COMPOUNDED ITEM Take 150 mg by mouth at bedtime. Bio identical Progesterone 150 ([facility])     Omega-3 Fatty Acids (FISH OIL) 1200 MG CAPS Take 2,400 mg  by mouth every evening.     SYNTHROID 112 MCG tablet Take 1 tablet (112 mcg total) by mouth daily before breakfast. 90 tablet 3   ipratropium (ATROVENT) 0.03 % nasal spray Place 2 sprays into both nostrils every 12 (twelve) hours. (Patient not taking: Reported on  10/03/2019) 30 mL 0   No current facility-administered medications on file prior to visit.    Allergies: Penicillins and Sulfa antibiotics  Review of Systems: General:   Denies fever, chills, unexplained weight loss.  Optho/Auditory:   Denies visual changes, blurred vision/LOV Respiratory:   Denies SOB, DOE more than baseline levels.   Cardiovascular:   Denies chest pain, palpitations, new onset peripheral edema  Gastrointestinal:   Denies nausea, vomiting, diarrhea.  Genitourinary: Denies dysuria, freq/ urgency, flank pain or discharge from genitals.  Endocrine:     Denies hot or cold intolerance, polyuria, polydipsia. Musculoskeletal:   Denies unexplained myalgias, joint swelling, unexplained arthralgias, gait problems.  Skin:  Denies rash, suspicious lesions Neurological:     Denies dizziness, unexplained weakness, numbness  Psychiatric/Behavioral:   Denies mood changes, suicidal or homicidal ideations, hallucinations    Objective:    Blood pressure (!) 148/92, pulse 80, height 5' 2.21" (1.58 m), weight 201 lb 11.2 oz (91.5 kg), SpO2 96 %. Body mass index is 36.65 kg/m. General Appearance:    Alert, cooperative, no distress, appears stated age  Head:    Normocephalic, without obvious abnormality, atraumatic  Eyes:    PERRL, conjunctiva/corneas clear, EOM's intact, both eyes  Ears:    Normal TM's and external ear canals, both ears  Nose:   Nares normal, septum midline, mucosa normal, no drainage    or sinus tenderness  Throat:   Lips w/o lesion, mucosa moist, and tongue normal; teeth and   gums normal  Neck:   Supple, symmetrical, trachea midline, no adenopathy;    thyroid:  no enlargement/tenderness/nodules; no carotid   bruit or JVD  Back:     Symmetric, no curvature, ROM normal, no CVA tenderness  Lungs:     Clear to auscultation bilaterally, respirations unlabored, no       Wh/ R/ R  Chest Wall:    No tenderness or gross deformity; normal excursion   Heart:    Regular  rate and rhythm, S1 and S2 normal, no murmur  Breast Exam:    Deferred to Ob-Gyn.  Abdomen:     Soft, non-tender, bowel sounds active all four quadrants, No   G/R/R, no masses, no organomegaly  Genitalia:    Deferred to Ob-Gyn.  Rectal:    Deferred to Ob-Gyn.  Extremities:   Extremities normal, atraumatic, no cyanosis or gross edema  Pulses:   Normal  Skin:   Warm, dry, Skin color, texture, turgor normal, no obvious rashes or lesions, cervical scar noted Psych: No HI/SI, judgement and insight good, Euthymic mood. Full Affect.  Neurologic:   CNII-XII grossly intact, normal strength, sensation and reflexes throughout

## 2019-10-06 ENCOUNTER — Telehealth: Payer: Self-pay | Admitting: Physician Assistant

## 2019-10-06 MED ORDER — DIAZEPAM 10 MG PO TABS: ORAL_TABLET | ORAL | 0 refills | Status: DC

## 2019-10-06 NOTE — Telephone Encounter (Signed)
Scheduled for mri tomorrow and patient is having anxiety due to claustrophobia. Is there medication that can be called in or taken to help reduce anxiety thanks.

## 2019-10-06 NOTE — Telephone Encounter (Signed)
Patient is aware med sent to [facility]. AS, CMA

## 2019-10-06 NOTE — Addendum Note (Signed)
Addended by: Sylvester Harder on: 10/06/2019 02:37 PM   Modules accepted: Orders

## 2019-10-07 DIAGNOSIS — M79671 Pain in right foot: Secondary | ICD-10-CM | POA: Diagnosis not present

## 2019-10-10 ENCOUNTER — Ambulatory Visit: Payer: BC Managed Care – PPO | Admitting: Physician Assistant

## 2019-10-10 ENCOUNTER — Ambulatory Visit: Payer: BC Managed Care – PPO

## 2019-10-10 DIAGNOSIS — M79671 Pain in right foot: Secondary | ICD-10-CM | POA: Diagnosis not present

## 2019-10-11 ENCOUNTER — Ambulatory Visit: Payer: BC Managed Care – PPO

## 2019-10-13 ENCOUNTER — Emergency Department (HOSPITAL_BASED_OUTPATIENT_CLINIC_OR_DEPARTMENT_OTHER): Payer: BC Managed Care – PPO

## 2019-10-13 ENCOUNTER — Other Ambulatory Visit: Payer: Self-pay

## 2019-10-13 ENCOUNTER — Encounter (HOSPITAL_BASED_OUTPATIENT_CLINIC_OR_DEPARTMENT_OTHER): Payer: Self-pay | Admitting: *Deleted

## 2019-10-13 ENCOUNTER — Ambulatory Visit (INDEPENDENT_AMBULATORY_CARE_PROVIDER_SITE_OTHER): Payer: BC Managed Care – PPO | Admitting: Physician Assistant

## 2019-10-13 ENCOUNTER — Emergency Department (HOSPITAL_BASED_OUTPATIENT_CLINIC_OR_DEPARTMENT_OTHER)
Admission: EM | Admit: 2019-10-13 | Discharge: 2019-10-13 | Disposition: A | Discharge status: Home / Self Care | Payer: BC Managed Care – PPO | Attending: Emergency Medicine | Admitting: Emergency Medicine

## 2019-10-13 ENCOUNTER — Telehealth: Payer: Self-pay | Admitting: Physician Assistant

## 2019-10-13 VITALS — BP 171/83 | HR 99

## 2019-10-13 DIAGNOSIS — R030 Elevated blood-pressure reading, without diagnosis of hypertension: Secondary | ICD-10-CM

## 2019-10-13 DIAGNOSIS — Z955 Presence of coronary angioplasty implant and graft: Secondary | ICD-10-CM | POA: Insufficient documentation

## 2019-10-13 DIAGNOSIS — Z79899 Other long term (current) drug therapy: Secondary | ICD-10-CM | POA: Diagnosis not present

## 2019-10-13 DIAGNOSIS — Z96652 Presence of left artificial knee joint: Secondary | ICD-10-CM | POA: Insufficient documentation

## 2019-10-13 DIAGNOSIS — N3000 Acute cystitis without hematuria: Secondary | ICD-10-CM | POA: Insufficient documentation

## 2019-10-13 DIAGNOSIS — N390 Urinary tract infection, site not specified: Secondary | ICD-10-CM | POA: Diagnosis not present

## 2019-10-13 DIAGNOSIS — R0789 Other chest pain: Secondary | ICD-10-CM | POA: Insufficient documentation

## 2019-10-13 DIAGNOSIS — I10 Essential (primary) hypertension: Secondary | ICD-10-CM

## 2019-10-13 DIAGNOSIS — J90 Pleural effusion, not elsewhere classified: Secondary | ICD-10-CM | POA: Diagnosis not present

## 2019-10-13 DIAGNOSIS — M79604 Pain in right leg: Secondary | ICD-10-CM | POA: Diagnosis not present

## 2019-10-13 DIAGNOSIS — R600 Localized edema: Secondary | ICD-10-CM | POA: Diagnosis not present

## 2019-10-13 DIAGNOSIS — E039 Hypothyroidism, unspecified: Secondary | ICD-10-CM | POA: Insufficient documentation

## 2019-10-13 DIAGNOSIS — M79671 Pain in right foot: Secondary | ICD-10-CM

## 2019-10-13 DIAGNOSIS — R079 Chest pain, unspecified: Secondary | ICD-10-CM | POA: Diagnosis not present

## 2019-10-13 LAB — URINALYSIS, ROUTINE W REFLEX MICROSCOPIC
Bilirubin Urine: NEGATIVE
Glucose, UA: NEGATIVE mg/dL
Ketones, ur: NEGATIVE mg/dL
Nitrite: NEGATIVE
Protein, ur: NEGATIVE mg/dL
Specific Gravity, Urine: 1.015 (ref 1.005–1.030)
pH: 7 (ref 5.0–8.0)

## 2019-10-13 LAB — URINALYSIS, MICROSCOPIC (REFLEX)

## 2019-10-13 LAB — BASIC METABOLIC PANEL
Anion gap: 10 (ref 5–15)
BUN: 19 mg/dL (ref 8–23)
CO2: 25 mmol/L (ref 22–32)
Calcium: 9.6 mg/dL (ref 8.9–10.3)
Chloride: 101 mmol/L (ref 98–111)
Creatinine, Ser: 0.45 mg/dL (ref 0.44–1.00)
GFR calc non Af Amer: >60 mL/min (ref >60)
Glucose, Bld: 100 mg/dL — ABNORMAL HIGH (ref 70–99)
Potassium: 3.7 mmol/L (ref 3.5–5.1)
Sodium: 136 mmol/L (ref 135–145)

## 2019-10-13 LAB — CBC
HCT: 44.1 % (ref 36.0–46.0)
Hemoglobin: 15.1 g/dL — ABNORMAL HIGH (ref 12.0–15.0)
MCH: 32.6 pg (ref 26.0–34.0)
MCHC: 34.2 g/dL (ref 30.0–36.0)
MCV: 95.2 fL (ref 80.0–100.0)
Platelets: 239 10*3/uL (ref 150–400)
RBC: 4.63 MIL/uL (ref 3.87–5.11)
RDW: 11.9 % (ref 11.5–15.5)
WBC: 9.3 10*3/uL (ref 4.0–10.5)
nRBC: 0 % (ref 0.0–0.2)

## 2019-10-13 LAB — TROPONIN I (HIGH SENSITIVITY)
Troponin I (High Sensitivity): 5 ng/L (ref <18)
Troponin I (High Sensitivity): 5 ng/L (ref <18)

## 2019-10-13 MED ORDER — HYDROXYZINE HCL 25 MG PO TABS: ORAL_TABLET | ORAL | 0 refills | Status: DC

## 2019-10-13 MED ORDER — CIPROFLOXACIN HCL 500 MG PO TABS: 500.0000 mg | ORAL_TABLET | Freq: Two times a day (BID) | ORAL | 0 refills | Status: DC

## 2019-10-13 NOTE — Telephone Encounter (Signed)
Per Kandis Cocking sending Hydroxyzine to pharmacy. Left message for patient to call back. AS, CMA

## 2019-10-13 NOTE — Telephone Encounter (Signed)
Please see below. AS, CMA 

## 2019-10-13 NOTE — Telephone Encounter (Signed)
Patient and spouse stopped back by office after getting discharged from ED. They have called in meds to help with the card situation from this morning. They are requesting something for her acute anxiety from the events today to help calm her down. Please advise if we can prescribe anything for the patient.

## 2019-10-13 NOTE — ED Triage Notes (Signed)
Pt has been having elevated BP at home (171/88) and has been feeling generally unwell ("not myself and a little light headed and dizzy) and she has been having CP.  Pt was seen by RN at her PCP office and they wanted her to be evaluated in the ED.  Pt is having some chest pressure and feeling like her heart is beating fast at this time.    Pt spouse advised me that there should be no covid 19 testing on pt.  Advised that she will be able to make all medical decisions herself.

## 2019-10-13 NOTE — Discharge Instructions (Signed)

## 2019-10-13 NOTE — Addendum Note (Signed)
Addended by: Sylvester Harder on: 10/13/2019 04:29 PM   Modules accepted: Orders

## 2019-10-13 NOTE — ED Provider Notes (Signed)
[facility] EMERGENCY DEPARTMENT Provider Note   CSN: [id number] Arrival date & time: 10/13/19  1057     History Chief Complaint  Patient presents with  . Chest Pain    Marissa Larson is a 64 y.o. female who presents with a chief complaint of chest pain.  Patient states that she has had several days of generalized malaise, fatigue, mild nausea.  Last night she had several episodes that woke her from sleep of sharp left-sided chest pain which was fleeting, intermittent but occurred on and off for several hours.  She denied diaphoresis, vomiting.  This morning she felt somewhat short of breath.  Her husband times and that she seemed "very worked up."  And also chimes in that she has been under "significant stress from her job."  The patient also states that she is going to have a knee surgery in 2 weeks is is nervous about that.  She has a history of mild hypertension treated with hydrochlorothiazide and was treated for it today.  She was going back to her doctor this morning to recheck her blood pressure however it was elevated and they sent her here for further evaluation.  She denies any recent confinement although she does sit at her desk for multiple hours a day.  She denies fevers, cough, vomiting or diarrhea.  She does not smoke.  She does not have a history of high cholesterol.  She has no primary relative with MI or CAD before the age of 28.  He has no history of diabetes.  Patient denies history of DVT or PE.  She does use exogenous estrogen cream.  HPI     Past Medical History:  Diagnosis Date  . Arthritis   . Chest pain 06/2013   Left sided  . Chronic leg pain 06/2013  . Complication of anesthesia   . Dysrhythmia   . Fatigue   . GERD (gastroesophageal reflux disease)   . History of kidney stones   . Hypertension   . Hypothyroidism   . Nonproductive cough 06/2013  . Overweight 01/18/2014  . Peripheral neuropathy   . PONV (postoperative nausea and vomiting)   .  Sleep difficulties    Change in sleep patterns  . SOB (shortness of breath) 06/2013  . Tachycardia 06/2013  . Thyroid disease    hypo  . UTI (lower urinary tract infection)   . Vesico-ureteric reflux     Patient Active Problem List   Diagnosis Date Noted  . Elevated LDL cholesterol level 07/19/2018  . Prediabetes 07/19/2018  . Paresthesia 12/09/2017  . Chronic insomnia 11/24/2017  . Insomnia secondary to chronic pain 11/24/2017  . Complaint related to dreams 11/24/2017  . 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 Neuropathy 08/18/2017  . 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 Obesity, Class I, BMI 30-34.9 12/03/2016  . Kidney stones 09/12/2016  . Nausea 09/12/2016  . High risk medication use 07/30/2016  . NAFLD (nonalcoholic fatty liver disease) 65/78/4696  . Primary osteoarthritis of both feet 04/12/2016  . History of kidney stones 04/12/2016  . Primary osteoarthritis of both hands 04/11/2016  . Bilateral primary osteoarthritis of hip 04/11/2016  . Primary osteoarthritis of both knees 04/11/2016  . Spondylosis of lumbar region without myelopathy or radiculopathy 04/11/2016  . Hyperuricemia 04/11/2016  . Primary insomnia 04/11/2016  . Flank pain 04/02/2016  . Sinusitis 04/02/2016  . Elevated HDL 11/11/2015  .  Elevated LFTs 11/11/2015  . Adjustment disorder with mixed anxiety and depressed mood 11/11/2015  . Dysuria 11/11/2015  . Hypothyroidism 10/14/2015  . Pseudogout involving multiple joints 10/14/2015  . Vitamin D deficiency 10/14/2015  . Hormone replacement therapy- per GYN 10/14/2015  . HTN (hypertension) 10/14/2015  . Fatigue 10/14/2015  . h/o Hiatal hernia 10/14/2015  . Sleep difficulties 10/14/2015  . Generalized OA 10/14/2015  . Counseling on health promotion and disease prevention 10/14/2015  . 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 Posterior vitreous detachment of left eye 01/25/2015  . chronic Palpitations 07/19/2013    Past Surgical History:  Procedure Laterality Date  . ABDOMINAL SURGERY  1994  . CYSTOSCOPY/URETEROSCOPY/HOLMIUM LASER/STENT PLACEMENT Left 09/22/2017   Procedure: CYSTOSCOPY/RETROGRADE/URETEROSCOPY/ BASKET STONE EXTRACTION/STENT PLACEMENT;  Surgeon: Rene Paci, MD;  Location: [facility] ORS;  Service: Urology;  Laterality: Left;  ONLY NEEDS 60 MIN  . FOOT SURGERY Right 2009  . KIDNEY SURGERY Left 1993  . KNEE SURGERY     Multiple knee surgeries  . OOPHORECTOMY Left 1994  . 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 Thurston Hole  . small fiber neuropathy biopsy  12/2017  . TONSILLECTOMY    . URETEROSCOPY VIA URETEROSTOMY Left 1994     OB History   No obstetric history on file.     Family History  Problem Relation Age of Onset  . Migraines Other        Fam Hx   . Heart disease Other        Fam Hx  . Thyroid disease Other        Fam Hx - She also has thyroid disease  . Diabetes Other        Fam Hx  . Arthritis Other        Fam Hx  . Gallbladder disease Other        Fam Hx  . Cancer Other        Fam Hx - Pancreatic  . Hypertension Other        Fam Hx  . Heart attack Cousin        Multiple cousins with MI's before 72 years of age  . Heart disease Mother 5  . Hyperlipidemia Mother 26  . Hypertension Mother 10  . Stroke Mother 32  . Heart disease Maternal Grandmother   . Cancer Father 57       pancreatic  . Diabetes Brother 50  . Heart attack Maternal Uncle   . Heart attack Paternal Aunt     Social History   Tobacco Use  . Smoking status: Never Smoker  . Smokeless tobacco: Never Used  Vaping Use  . Vaping Use: Never used  Substance Use Topics  . Alcohol use: Yes    Alcohol/week: 1.0 - 2.0 standard drink    Types: 1 - 2 Glasses of wine per week    Comment: Twice a week  . Drug use: No    Home Medications Prior to Admission  medications   Medication Sig Start Date End Date Taking? Authorizing Provider  Cholecalciferol (VITAMIN D) 50 MCG (2000 UT) CAPS 2,000 daily OTC in addition to once weekly prescription 07/19/18   Thomasene Lot, DO  ciprofloxacin (CIPRO) 500 MG tablet Take 1 tablet (500 mg total) by mouth 2 (two) times daily. One po bid x 7 days 10/13/19   Arthor Captain, PA-C  CREAM BASE EX Apply 1 application topically daily. BIEST Transdermal  Cream 0.5 mg: Compounded bio-identical Estriol (E3) combined by [facility]    [provider]  diazepam (VALIUM) 10 MG tablet 30 minutes before procedure. 10/06/19   Mayer Masker, PA-C  diclofenac (VOLTAREN) 75 MG EC tablet Take 75 mg by mouth daily as needed. 07/21/19   [provider]  escitalopram (LEXAPRO) 10 MG tablet Take 1 tablet (10 mg total) by mouth at bedtime. 04/19/19   Opalski, Gavin Pound, DO  eszopiclone (LUNESTA) 1 MG TABS tablet Take 1 mg by mouth at bedtime as needed. 03/30/19   [provider]  famotidine (PEPCID) 40 MG tablet Take 40 mg by mouth daily as needed (for heartburn/indigestion.).  06/18/17   [provider]  Flaxseed, Linseed, (FLAX SEED OIL) 1000 MG CAPS Take 2,000 mg by mouth every evening.    [provider]  hydrochlorothiazide (HYDRODIURIL) 12.5 MG tablet TAKE 1 TABLET BY MOUTH DAILY. 08/09/19   Mayer Masker, PA-C  hydroxychloroquine (PLAQUENIL) 200 MG tablet 1 tablet p.o. twice daily Monday through Friday 08/29/19   Gearldine Bienenstock, PA-C  hydrOXYzine (ATARAX/VISTARIL) 25 MG tablet Take 1-2 tablets by mouth every 6-8 hrs PNR anxiety 10/13/19   Abonza, Maritza, PA-C  ibuprofen (ADVIL,MOTRIN) 200 MG tablet Take 400 mg by mouth every 8 (eight) hours as needed (for pain.).    [provider]  ipratropium (ATROVENT) 0.03 % nasal spray Place 2 sprays into both nostrils every 12 (twelve) hours. Patient not taking: Reported on 10/03/2019 07/27/19   Christen Butter, NP  Magnesium 500 MG CAPS Take  1,000 mg by mouth every evening.    [provider]  Misc Natural Products (TART CHERRY ADVANCED PO) Take 2,000 mg by mouth daily. 1000 mg per capsule    [provider]  MITIGARE 0.6 MG CAPS TAKE 1 CAPSULE BY MOUTH DAILY. 07/25/19   Pollyann Savoy, MD  nitrofurantoin, macrocrystal-monohydrate, (MACROBID) 100 MG capsule Take by mouth daily as needed. UTI prevention 09/08/18   [provider]  NONFORMULARY OR COMPOUNDED ITEM Take 150 mg by mouth at bedtime. Bio identical Progesterone 150 ([facility])    [provider]  Omega-3 Fatty Acids (FISH OIL) 1200 MG CAPS Take 2,400 mg by mouth every evening.    [provider]  SYNTHROID 112 MCG tablet Take 1 tablet (112 mcg total) by mouth daily before breakfast. 05/19/19   Carlus Pavlov, MD  Vitamin D, Ergocalciferol, (DRISDOL) 1.25 MG (50000 UNIT) CAPS capsule Take 1 capsule (50,000 Units total) by mouth every 7 (seven) days. 10/03/19   Mayer Masker, PA-C    Allergies    Penicillins and Sulfa antibiotics  Review of Systems   Review of Systems Ten systems reviewed and are negative for acute change, except as noted in the HPI.   Physical Exam Updated Vital Signs BP (!) 160/71 (BP Location: Left Arm)   Pulse 65   Temp 98.3 F (36.8 C) (Oral)   Resp 13   Ht 5\' 6"  (1.676 m)   Wt 94.8 kg   SpO2 100%   BMI 33.72 kg/m   Physical Exam Vitals and nursing note reviewed.  Constitutional:      General: She is not in acute distress.    Appearance: She is well-developed. She is not diaphoretic.  HENT:     Head: Normocephalic and atraumatic.  Eyes:     General: No scleral icterus.    Conjunctiva/sclera: Conjunctivae normal.  Cardiovascular:     Rate and Rhythm: Normal rate and regular rhythm.  Heart sounds: Normal heart sounds. No murmur heard.  No friction rub. No gallop.   Pulmonary:     Effort: Pulmonary effort is normal. No respiratory distress.     Breath sounds: Normal  breath sounds.  Abdominal:     General: Bowel sounds are normal. There is no distension.     Palpations: Abdomen is soft. There is no mass.     Tenderness: There is no abdominal tenderness. There is no guarding.  Musculoskeletal:     Cervical back: Normal range of motion.  Skin:    General: Skin is warm and dry.  Neurological:     Mental Status: She is alert and oriented to person, place, and time.  Psychiatric:     Comments: Appears anxious     ED Results / Procedures / Treatments   Labs (all labs ordered are listed, but only abnormal results are displayed) Labs Reviewed  BASIC METABOLIC PANEL - Abnormal; Notable for the following components:      Result Value   Glucose, Bld 100 (*)    All other components within normal limits  CBC - Abnormal; Notable for the following components:   Hemoglobin 15.1 (*)    All other components within normal limits  URINALYSIS, ROUTINE W REFLEX MICROSCOPIC - Abnormal; Notable for the following components:   Hgb urine dipstick TRACE (*)    Leukocytes,Ua TRACE (*)    All other components within normal limits  URINALYSIS, MICROSCOPIC (REFLEX) - Abnormal; Notable for the following components:   Bacteria, UA FEW (*)    All other components within normal limits  URINE CULTURE  TROPONIN I (HIGH SENSITIVITY)  TROPONIN I (HIGH SENSITIVITY)    EKG EKG Interpretation  Date/Time:  Thursday October 13 2019 11:35:39 EDT Ventricular Rate:  79 PR Interval:    QRS Duration: 108 QT Interval:  394 QTC Calculation: 452 R Axis:   14 Text Interpretation: Sinus rhythm No significant change since last tracing Confirmed by Cathren Laine ([id number]) on 10/13/2019 12:05:58 PM   Radiology DG Chest 2 View  Result Date: 10/13/2019 CLINICAL DATA:  64 year old female with chest pain and hypertension. EXAM: CHEST - 2 VIEW COMPARISON:  CT Chest, Abdomen, and Pelvis 03/12/2017 and earlier. FINDINGS: Lung volumes and mediastinal contours are stable since 2015. Mildly  tortuous thoracic aorta. Both lungs appear stable and clear. No pneumothorax or pleural effusion. No acute osseous abnormality identified. Negative visible bowel gas pattern. IMPRESSION: No acute cardiopulmonary abnormality. Electronically Signed   By: Odessa Fleming M.D.   On: 10/13/2019 11:46   US Venous Img Lower Unilateral Right (DVT)  Result Date: 10/13/2019 CLINICAL DATA:  Right lower extremity pain and edema. Evaluate for DVT. EXAM: RIGHT LOWER EXTREMITY VENOUS DOPPLER ULTRASOUND TECHNIQUE: Gray-scale sonography with graded compression, as well as color Doppler and duplex ultrasound were performed to evaluate the lower extremity deep venous systems from the level of the common femoral vein and including the common femoral, femoral, profunda femoral, popliteal and calf veins including the posterior tibial, peroneal and gastrocnemius veins when visible. The superficial great saphenous vein was also interrogated. Spectral Doppler was utilized to evaluate flow at rest and with distal augmentation maneuvers in the common femoral, femoral and popliteal veins. COMPARISON:  None. FINDINGS: Contralateral Common Femoral Vein: Respiratory phasicity is normal and symmetric with the symptomatic side. No evidence of thrombus. Normal compressibility. Common Femoral Vein: No evidence of thrombus. Normal compressibility, respiratory phasicity and response to augmentation. Saphenofemoral Junction: No evidence of thrombus. Normal compressibility and flow  on color Doppler imaging. Profunda Femoral Vein: No evidence of thrombus. Normal compressibility and flow on color Doppler imaging. Femoral Vein: No evidence of thrombus. Normal compressibility, respiratory phasicity and response to augmentation. Popliteal Vein: No evidence of thrombus. Normal compressibility, respiratory phasicity and response to augmentation. Calf Veins: No evidence of thrombus. Normal compressibility and flow on color Doppler imaging. Superficial Great  Saphenous Vein: No evidence of thrombus. Normal compressibility. Venous Reflux:  None. Other Findings:  None. IMPRESSION: No evidence of DVT within the right lower extremity. Electronically Signed   By: Simonne ComeJohn  Watts M.D.   On: 10/13/2019 13:20    Procedures Procedures (including critical care time)  Medications Ordered in ED Medications - No data to display  ED Course  I have reviewed the triage vital signs and the nursing notes.  Pertinent labs & imaging results that were available during my care of the patient were reviewed by me and considered in my medical decision making (see chart for details).    MDM Rules/Calculators/A&P                          Given the large differential diagnosis for Marissa Larson, the decision making in this case is of high complexity.  I ordered and reviewed labs which included a urinalysis which appears to be infected.  Troponin x2 is negative, CBC without elevated white blood cell count.  Mild elevated hemoglobin might be suggestive of some dehydration.  BMP with borderline elevated glucose of insignificant value.  I ordered and reviewed a 2 view chest x-ray and right leg ultrasound both of which show no acute abnormalities. EKG shows normal sinus rhythm at a rate of 79.   After evaluating all of the data points in this case, the presentation of Marissa Larson is NOT consistent with Acute Coronary Syndrome (ACS) and/or myocardial ischemia, pulmonary embolism, aortic dissection; Borhaave's, significant arrythmia, pneumothorax, cardiac tamponade, or other emergent cardiopulmonary condition.  Further, the presentation of Marissa Larson is NOT consistent with pericarditis, myocarditis, cholecystitis, pancreatitis, mediastinitis, endocarditis, new valvular disease.  Additionally, the presentation of Marissa DonnaMichele Fowleris NOT consistent with flail chest, cardiac contusion, ARDS, or significant intra-thoracic or intra-abdominal bleeding.  Moreover, this presentation  is NOT consistent with pneumonia, sepsis, or pyelonephritis.    Strict return and follow-up precautions have been given by me personally or by detailed written instruction given verbally by nursing staff using the teach back method to the patient/family/caregiver(s).  Data Reviewed/Counseling: I have reviewed the patient's vital signs, nursing notes, and other relevant tests/information. I had a detailed discussion regarding the historical points, exam findings, and any diagnostic results supporting the discharge diagnosis. I also discussed the need for outpatient follow-up and the need to return to the ED if symptoms worsen or if there are any questions or concerns that arise at home.  Final Clinical Impression(s) / ED Diagnoses Final diagnoses:  Acute cystitis without hematuria  Elevated blood pressure reading  Atypical chest pain    Rx / DC Orders ED Discharge Orders         Ordered    ciprofloxacin (CIPRO) 500 MG tablet  2 times daily        10/13/19 1416           Arthor CaptainHarris, Vernie Piet, PA-C 10/13/19 1712    Cathren LaineSteinl, Kevin, MD 10/14/19 0710

## 2019-10-13 NOTE — Progress Notes (Signed)
Patient in office for nurse visit BP check. Patient brought BP log with readings ranging from 164/85-146/118.   Patient states she has had some chest pain, headache, dizziness and general fatigue recently. Patient BP reading in office 171/83.   Spoke with Kandis Cocking who advised patient should go to ED for evaluation. Patient has been advised. Patient declines EMS but is having her husband come to office to pick her up and take her to ED. AS, CMA   Patient spouse came into office to pick up patient and was very agitated and upset wanting to know if patient would be covid tested to be allowed treatment at ED. Greg asked for me to call ED and let them know they would not be covid tested or given the covid vaccine and that he would stay with patient every second patient was in the ED. He also wanted to know who the ED director was.    Called and spoke with physician in ED at [facility] and advised patient was coming and spouse very agitated. AS, CMA   Agree with above documentation and recommendations. Pending ED evaluation, will make medication adjustments for better BP control.  Mayer Masker, PA-C

## 2019-10-13 NOTE — ED Notes (Signed)
Pt self ambulated to restroom without difficulty. 

## 2019-10-14 ENCOUNTER — Other Ambulatory Visit: Payer: Self-pay | Admitting: Family Medicine

## 2019-10-14 ENCOUNTER — Telehealth: Payer: Self-pay | Admitting: Physician Assistant

## 2019-10-14 ENCOUNTER — Other Ambulatory Visit: Payer: Self-pay | Admitting: Physician Assistant

## 2019-10-14 DIAGNOSIS — F4323 Adjustment disorder with mixed anxiety and depressed mood: Secondary | ICD-10-CM

## 2019-10-14 DIAGNOSIS — I10 Essential (primary) hypertension: Secondary | ICD-10-CM

## 2019-10-14 DIAGNOSIS — G479 Sleep disorder, unspecified: Secondary | ICD-10-CM

## 2019-10-14 MED ORDER — AMLODIPINE BESYLATE 5 MG PO TABS: 5.0000 mg | ORAL_TABLET | Freq: Every day | ORAL | 0 refills | Status: DC

## 2019-10-14 NOTE — Telephone Encounter (Signed)
Patient called this morning to give update after ED visit yesterday. She said that the staff was amazing here and to thank everyone for suggesting she go and get seen. She also said the acute anxiety meds helped a lot last night and she was able to get a good nights rest, overall she says she feel quite better. She did want to discuss with the clinic staff about her next appt/care plan. She is due to come back in Dec based on last OV for a 3 mnth f/u but is wondering does she need to get seen sooner because of these issues she is having (pt seems to also be followed by cards). Please advise.

## 2019-10-14 NOTE — Telephone Encounter (Signed)
Please see other message. AS, CMA 

## 2019-10-14 NOTE — Progress Notes (Signed)
Sending Amlodidpine 5mg  1 po qd for HTN per Martiza. Patient is aware to take medication as directed. Keep 3 month follow up with Marita and schedule with Cards. Patient is aware to check BP at home and call with log. Patient aware to call if she experience dizziness. AS, CMA

## 2019-10-14 NOTE — Progress Notes (Deleted)
Office Visit Note  Patient: Marissa Larson             Date of Birth: [birth year]           MRN: [id number]             PCP: Mayer Masker, PA-C Referring: Mayer Masker, PA-C Visit Date: 10/26/2019 Occupation: @GUAROCC @  Subjective:  No chief complaint on file.   History of Present Illness: Marissa Larson is a 64 y.o. female ***   Activities of Daily Living:  Patient reports morning stiffness for *** {minute/hour:19697}.   Patient {ACTIONS;DENIES/REPORTS:21021675::"Denies"} nocturnal pain.  Difficulty dressing/grooming: {ACTIONS;DENIES/REPORTS:21021675::"Denies"} Difficulty climbing stairs: {ACTIONS;DENIES/REPORTS:21021675::"Denies"} Difficulty getting out of chair: {ACTIONS;DENIES/REPORTS:21021675::"Denies"} Difficulty using hands for taps, buttons, cutlery, and/or writing: {ACTIONS;DENIES/REPORTS:21021675::"Denies"}  No Rheumatology ROS completed.   PMFS History:  Patient Active Problem List   Diagnosis Date Noted  . Elevated LDL cholesterol level 07/19/2018  . Prediabetes 07/19/2018  . Paresthesia 12/09/2017  . Chronic insomnia 11/24/2017  . Insomnia secondary to chronic pain 11/24/2017  . Complaint related to dreams 11/24/2017  . 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 Neuropathy 08/18/2017  . 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 Obesity, Class I, BMI 30-34.9 12/03/2016  . Kidney stones 09/12/2016  . Nausea 09/12/2016  . High risk medication use 07/30/2016  . NAFLD (nonalcoholic fatty liver disease) 08/01/2016  . Primary osteoarthritis of both feet 04/12/2016  . History of kidney stones 04/12/2016  . Primary osteoarthritis of both hands 04/11/2016  . Bilateral primary osteoarthritis of hip 04/11/2016  . Primary osteoarthritis of both knees 04/11/2016  . Spondylosis of lumbar region without myelopathy or radiculopathy 04/11/2016  . Hyperuricemia 04/11/2016    . Primary insomnia 04/11/2016  . Flank pain 04/02/2016  . Sinusitis 04/02/2016  . Elevated HDL 11/11/2015  . Elevated LFTs 11/11/2015  . Adjustment disorder with mixed anxiety and depressed mood 11/11/2015  . Dysuria 11/11/2015  . Hypothyroidism 10/14/2015  . Pseudogout involving multiple joints 10/14/2015  . Vitamin D deficiency 10/14/2015  . Hormone replacement therapy- per GYN 10/14/2015  . HTN (hypertension) 10/14/2015  . Fatigue 10/14/2015  . h/o Hiatal hernia 10/14/2015  . Sleep difficulties 10/14/2015  . Generalized OA 10/14/2015  . Counseling on health promotion and disease prevention 10/14/2015  . 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 Age-related nuclear cataract of both eyes 01/25/2015  . Posterior vitreous detachment of left eye 01/25/2015  . chronic Palpitations 07/19/2013    Past Medical History:  Diagnosis Date  . Arthritis   . Chest pain 06/2013   Left sided  . Chronic leg pain 06/2013  . Complication of anesthesia   . Dysrhythmia   . Fatigue   . GERD (gastroesophageal reflux disease)   . History of kidney stones   . Hypertension   . Hypothyroidism   . Nonproductive cough 06/2013  . Overweight 01/18/2014  . Peripheral neuropathy   . PONV (postoperative nausea and vomiting)   . Sleep difficulties    Change in sleep patterns  . SOB (shortness of breath) 06/2013  . Tachycardia 06/2013  . Thyroid disease    hypo  . UTI (lower urinary tract infection)   . Vesico-ureteric reflux     Family History  Problem Relation Age of Onset  . Migraines Other        Fam Hx   . Heart disease Other  Fam Hx  . Thyroid disease Other        Fam Hx - She also has thyroid disease  . Diabetes Other        Fam Hx  . Arthritis Other        Fam Hx  . Gallbladder disease Other        Fam Hx  . Cancer Other        Fam Hx - Pancreatic  . Hypertension Other        Fam Hx  . Heart attack Cousin        Multiple cousins with MI's before 51 years of age  . Heart  disease Mother 27  . Hyperlipidemia Mother 50  . Hypertension Mother 91  . Stroke Mother 78  . Heart disease Maternal Grandmother   . Cancer Father 79       pancreatic  . Diabetes Brother 50  . Heart attack Maternal Uncle   . Heart attack Paternal Aunt    Past Surgical History:  Procedure Laterality Date  . ABDOMINAL SURGERY  1994  . CYSTOSCOPY/URETEROSCOPY/HOLMIUM LASER/STENT PLACEMENT Left 09/22/2017   Procedure: CYSTOSCOPY/RETROGRADE/URETEROSCOPY/ BASKET STONE EXTRACTION/STENT PLACEMENT;  Surgeon: Rene Paci, MD;  Location: [facility];  Service: Urology;  Laterality: Left;  ONLY NEEDS 60 MIN  . FOOT SURGERY Right 2009  . KIDNEY SURGERY Left 1993  . KNEE SURGERY     Multiple knee surgeries  . OOPHORECTOMY Left 1994  . 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 Thurston Hole  . small fiber neuropathy biopsy  12/2017  . TONSILLECTOMY    . URETEROSCOPY VIA URETEROSTOMY Left 1994   Social History   Social History Narrative  . Not on file   Immunization History  Administered Date(s) Administered  . Tdap 01/07/2011     Objective: Vital Signs: There were no vitals taken for this visit.   Physical Exam   Musculoskeletal Exam: ***  CDAI Exam: CDAI Score: -- Patient Global: --; Provider Global: -- Swollen: --; Tender: -- Joint Exam 10/26/2019   No joint exam has been documented for this visit   There is currently no information documented on the homunculus. Go to the Rheumatology activity and complete the homunculus joint exam.  Investigation: No additional findings.  Imaging: DG Chest 2 View  Result Date: 10/13/2019 CLINICAL DATA:  64 year old female with chest pain and hypertension. EXAM: CHEST - 2 VIEW COMPARISON:  CT Chest, Abdomen, and Pelvis 03/12/2017 and earlier. FINDINGS: Lung volumes and mediastinal contours are stable since 2015. Mildly tortuous thoracic aorta. Both lungs appear stable and clear. No pneumothorax or pleural effusion. No acute osseous  abnormality identified. Negative visible bowel gas pattern. IMPRESSION: No acute cardiopulmonary abnormality. Electronically Signed   By: Odessa Fleming M.D.   On: 10/13/2019 11:46   US Venous Img Lower Unilateral Right (DVT)  Result Date: 10/13/2019 CLINICAL DATA:  Right lower extremity pain and edema. Evaluate for DVT. EXAM: RIGHT LOWER EXTREMITY VENOUS DOPPLER ULTRASOUND TECHNIQUE: Gray-scale sonography with graded compression, as well as color Doppler and duplex ultrasound were performed to evaluate the lower extremity deep venous systems from the level of the common femoral vein and including the common femoral, femoral, profunda femoral, popliteal and calf veins including the posterior tibial, peroneal and gastrocnemius veins when visible. The superficial great saphenous vein was also interrogated. Spectral Doppler was utilized to evaluate flow at rest and with distal augmentation maneuvers in the common femoral, femoral and popliteal veins. COMPARISON:  None. FINDINGS: Contralateral Common  Femoral Vein: Respiratory phasicity is normal and symmetric with the symptomatic side. No evidence of thrombus. Normal compressibility. Common Femoral Vein: No evidence of thrombus. Normal compressibility, respiratory phasicity and response to augmentation. Saphenofemoral Junction: No evidence of thrombus. Normal compressibility and flow on color Doppler imaging. Profunda Femoral Vein: No evidence of thrombus. Normal compressibility and flow on color Doppler imaging. Femoral Vein: No evidence of thrombus. Normal compressibility, respiratory phasicity and response to augmentation. Popliteal Vein: No evidence of thrombus. Normal compressibility, respiratory phasicity and response to augmentation. Calf Veins: No evidence of thrombus. Normal compressibility and flow on color Doppler imaging. Superficial Great Saphenous Vein: No evidence of thrombus. Normal compressibility. Venous Reflux:  None. Other Findings:  None. IMPRESSION:  No evidence of DVT within the right lower extremity. Electronically Signed   By: Simonne Come M.D.   On: 10/13/2019 13:20    Recent Labs: Lab Results  Component Value Date   WBC 9.3 10/13/2019   HGB 15.1 (H) 10/13/2019   PLT 239 10/13/2019   NA 136 10/13/2019   K 3.7 10/13/2019   CL 101 10/13/2019   CO2 25 10/13/2019   GLUCOSE 100 (H) 10/13/2019   BUN 19 10/13/2019   CREATININE 0.45 10/13/2019   BILITOT 0.3 08/17/2019   ALKPHOS 92 08/17/2019   AST 27 08/17/2019   ALT 25 08/17/2019   PROT 6.9 08/17/2019   ALBUMIN 4.7 08/17/2019   CALCIUM 9.6 10/13/2019   GFRAA 108 08/17/2019   QFTBGOLDPLUS NEGATIVE 02/16/2017    Speciality Comments: PLQ eye exam: 04/20/2019 normal. Dr. Burgess Estelle. Follow up in 1 year.  Procedures:  No procedures performed Allergies: Penicillins and Sulfa antibiotics   Assessment / Plan:     Visit Diagnoses: Inflammatory arthritis  High risk medication use  Pseudogout involving multiple joints  Primary osteoarthritis of both hands  Bilateral primary osteoarthritis of hip  Primary osteoarthritis of right knee  S/P total knee replacement, left  Primary osteoarthritis of both feet  Spondylosis of lumbar region without myelopathy or radiculopathy  Hyperuricemia  NAFLD (nonalcoholic fatty liver disease)  Small fiber neuropathy  Essential hypertension  h/o Hiatal hernia  Vitamin D deficiency  Primary insomnia  History of kidney stones  Orders: No orders of the defined types were placed in this encounter.  No orders of the defined types were placed in this encounter.   Face-to-face time spent with patient was *** minutes. Greater than 50% of time was spent in counseling and coordination of care.  Follow-Up Instructions: No follow-ups on file.   Gearldine Bienenstock, PA-C  Note - This record has been created using Dragon software.  Chart creation errors have been sought, but may not always  have been located. Such creation errors do not  reflect on  the standard of medical care.

## 2019-10-15 LAB — URINE CULTURE: Special Requests: NORMAL

## 2019-10-19 DIAGNOSIS — M1711 Unilateral primary osteoarthritis, right knee: Secondary | ICD-10-CM | POA: Diagnosis not present

## 2019-10-19 NOTE — Progress Notes (Signed)
Electrophysiology Office Note Date: 10/19/2019  ID:  Susana Duell, DOB 1955-04-28, MRN [id number]  PCP: Mayer Masker, PA-C Electrophysiologist: Ladona Ridgel  CC: surgical clearance  Marissa Larson is a 64 y.o. female seen today for Dr Ladona Ridgel.  She presents today for routine electrophysiology followup.  Since last being seen in our clinic, the patient reports doing reasonably well.  She did well with her L total knee replacement in 2020 and now needs her right knee replaced.  She has had some problems with hypertension and chest discomfort that is fairly atypical.   She denies palpitations,  PND, orthopnea, nausea, vomiting, dizziness, syncope, edema, weight gain, or early satiety.  Past Medical History:  Diagnosis Date  . Arthritis   . Chest pain 06/2013   Left sided  . Chronic leg pain 06/2013  . Complication of anesthesia   . Dysrhythmia   . Fatigue   . GERD (gastroesophageal reflux disease)   . History of kidney stones   . Hypertension   . Hypothyroidism   . Nonproductive cough 06/2013  . Overweight 01/18/2014  . Peripheral neuropathy   . PONV (postoperative nausea and vomiting)   . Sleep difficulties    Change in sleep patterns  . SOB (shortness of breath) 06/2013  . Tachycardia 06/2013  . Thyroid disease    hypo  . UTI (lower urinary tract infection)   . Vesico-ureteric reflux    Past Surgical History:  Procedure Laterality Date  . ABDOMINAL SURGERY  1994  . CYSTOSCOPY/URETEROSCOPY/HOLMIUM LASER/STENT PLACEMENT Left 09/22/2017   Procedure: CYSTOSCOPY/RETROGRADE/URETEROSCOPY/ BASKET STONE EXTRACTION/STENT PLACEMENT;  Surgeon: Rene Paci, MD;  Location: [facility];  Service: Urology;  Laterality: Left;  ONLY NEEDS 60 MIN  . FOOT SURGERY Right 2009  . KIDNEY SURGERY Left 1993  . KNEE SURGERY     Multiple knee surgeries  . OOPHORECTOMY Left 1994  . 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 Thurston Hole  . small fiber neuropathy biopsy  12/2017  .  TONSILLECTOMY    . URETEROSCOPY VIA URETEROSTOMY Left 1994    Current Outpatient Medications  Medication Sig Dispense Refill  . amLODipine (NORVASC) 5 MG tablet Take 1 tablet (5 mg total) by mouth daily. 90 tablet 0  . Cholecalciferol (VITAMIN D) 50 MCG (2000 UT) CAPS 2,000 daily OTC in addition to once weekly prescription 30 capsule   . ciprofloxacin (CIPRO) 500 MG tablet Take 1 tablet (500 mg total) by mouth 2 (two) times daily. One po bid x 7 days 14 tablet 0  . CREAM BASE EX Apply 1 application topically daily. BIEST Transdermal Cream 0.5 mg: Compounded bio-identical Estriol (E3) combined by [facility]    . diazepam (VALIUM) 10 MG tablet 30 minutes before procedure. 1 tablet 0  . diclofenac (VOLTAREN) 75 MG EC tablet Take 75 mg by mouth daily as needed.    Marland Kitchen escitalopram (LEXAPRO) 10 MG tablet Take 1 tablet (10 mg total) by mouth at bedtime. 90 tablet 1  . eszopiclone (LUNESTA) 1 MG TABS tablet Take 1 mg by mouth at bedtime as needed.    . famotidine (PEPCID) 40 MG tablet Take 40 mg by mouth daily as needed (for heartburn/indigestion.).   2  . Flaxseed, Linseed, (FLAX SEED OIL) 1000 MG CAPS Take 2,000 mg by mouth every evening.    . hydrochlorothiazide (HYDRODIURIL) 12.5 MG tablet TAKE 1 TABLET BY MOUTH DAILY. 30 tablet 1  . hydroxychloroquine (PLAQUENIL) 200 MG tablet 1 tablet p.o. twice daily Monday through Friday  120 tablet 0  . hydrOXYzine (ATARAX/VISTARIL) 25 MG tablet Take 1-2 tablets by mouth every 6-8 hrs PNR anxiety 30 tablet 0  . ibuprofen (ADVIL,MOTRIN) 200 MG tablet Take 400 mg by mouth every 8 (eight) hours as needed (for pain.).    Marland Kitchen ipratropium (ATROVENT) 0.03 % nasal spray Place 2 sprays into both nostrils every 12 (twelve) hours. (Patient not taking: Reported on 10/03/2019) 30 mL 0  . Magnesium 500 MG CAPS Take 1,000 mg by mouth every evening.    . Misc Natural Products (TART CHERRY ADVANCED PO) Take 2,000 mg by mouth daily. 1000 mg per capsule    . MITIGARE 0.6  MG CAPS TAKE 1 CAPSULE BY MOUTH DAILY. 90 capsule 0  . nitrofurantoin, macrocrystal-monohydrate, (MACROBID) 100 MG capsule Take by mouth daily as needed. UTI prevention    . NONFORMULARY OR COMPOUNDED ITEM Take 150 mg by mouth at bedtime. Bio identical Progesterone 150 ([facility])    . Omega-3 Fatty Acids (FISH OIL) 1200 MG CAPS Take 2,400 mg by mouth every evening.    Marland Kitchen SYNTHROID 112 MCG tablet Take 1 tablet (112 mcg total) by mouth daily before breakfast. 90 tablet 3  . Vitamin D, Ergocalciferol, (DRISDOL) 1.25 MG (50000 UNIT) CAPS capsule Take 1 capsule (50,000 Units total) by mouth every 7 (seven) days. 12 capsule 1   No current facility-administered medications for this visit.    Allergies:   Penicillins and Sulfa antibiotics   Social History: Social History   Socioeconomic History  . Marital status: Married    Spouse name: Not on file  . Number of children: 0  . Years of education: Not on file  . Highest education level: Not on file  Occupational History  . Occupation: Orthoptist  Tobacco Use  . Smoking status: Never Smoker  . Smokeless tobacco: Never Used  Vaping Use  . Vaping Use: Never used  Substance and Sexual Activity  . Alcohol use: Yes    Alcohol/week: 1.0 - 2.0 standard drink    Types: 1 - 2 Glasses of wine per week    Comment: Twice a week  . Drug use: No  . Sexual activity: Yes    Birth control/protection: None  Other Topics Concern  . Not on file  Social History Narrative  . Not on file   Social Determinants of Health   Financial Resource Strain:   . Difficulty of Paying Living Expenses: Not on file  Food Insecurity:   . Worried About Programme researcher, broadcasting/film/video in the Last Year: Not on file  . Ran Out of Food in the Last Year: Not on file  Transportation Needs:   . Lack of Transportation (Medical): Not on file  . Lack of Transportation (Non-Medical): Not on file  Physical Activity:   . Days of Exercise per Week: Not on file   . Minutes of Exercise per Session: Not on file  Stress:   . Feeling of Stress : Not on file  Social Connections:   . Frequency of Communication with Friends and Family: Not on file  . Frequency of Social Gatherings with Friends and Family: Not on file  . Attends Religious Services: Not on file  . Active Member of Clubs or Organizations: Not on file  . Attends Banker Meetings: Not on file  . Marital Status: Not on file  Intimate Partner Violence:   . Fear of Current or Ex-Partner: Not on file  . Emotionally Abused: Not on file  .  Physically Abused: Not on file  . Sexually Abused: Not on file    Family History: Family History  Problem Relation Age of Onset  . Migraines Other        Fam Hx   . Heart disease Other        Fam Hx  . Thyroid disease Other        Fam Hx - She also has thyroid disease  . Diabetes Other        Fam Hx  . Arthritis Other        Fam Hx  . Gallbladder disease Other        Fam Hx  . Cancer Other        Fam Hx - Pancreatic  . Hypertension Other        Fam Hx  . Heart attack Cousin        Multiple cousins with MI's before 31 years of age  . Heart disease Mother 67  . Hyperlipidemia Mother 10  . Hypertension Mother 36  . Stroke Mother 8  . Heart disease Maternal Grandmother   . Cancer Father 45       pancreatic  . Diabetes Brother 50  . Heart attack Maternal Uncle   . Heart attack Paternal Aunt     Review of Systems: All other systems reviewed and are otherwise negative except as noted above.   Physical Exam: VS:  There were no vitals taken for this visit. , BMI There is no height or weight on file to calculate BMI. Wt Readings from Last 3 Encounters:  10/13/19 208 lb 14.4 oz (94.8 kg)  10/03/19 201 lb 11.2 oz (91.5 kg)  07/27/19 195 lb (88.5 kg)    GEN- The patient is well appearing, alert and oriented x 3 today.   HEENT: normocephalic, atraumatic; sclera clear, conjunctiva pink; hearing intact; oropharynx clear; neck  supple  Lungs- Clear to ausculation bilaterally, normal work of breathing.  No wheezes, rales, rhonchi Heart- Regular rate and rhythm, no murmurs, rubs or gallops  GI- soft, non-tender, non-distended, bowel sounds present Extremities- no clubbing, cyanosis, or edema  MS- no significant deformity or atrophy Skin- warm and dry, no rash or lesion  Psych- euthymic mood, full affect Neuro- strength and sensation are intact   EKG:  EKG reviewed from 10/13/19  Recent Labs: 08/17/2019: ALT 25; TSH 1.300 10/13/2019: BUN 19; Creatinine, Ser 0.45; Hemoglobin 15.1; Platelets 239; Potassium 3.7; Sodium 136    Assessment and Plan:  1.  Surgical clearance She remains functionally independent but has had some hypertension with associated chest pain recently Will complete work up with echocardiogram and Lexiscan myoview  If normal, she is at low risk for knee surgery  2.  Palpitations Stable No change required today  3.  HTN She has been started on amlodipine by PCP but has not yet taken first dose Will follow     Current medicines are reviewed at length with the patient today.   The patient does not have concerns regarding her medicines.  The following changes were made today:  none  Labs/ tests ordered today include: none No orders of the defined types were placed in this encounter.    Disposition:   Follow up with EP prn     Signed, Gypsy Balsam, NP 10/19/2019 2:29 PM   [facility] [address] [place] [address] [phone] (office) [phone] (fax)

## 2019-10-20 ENCOUNTER — Other Ambulatory Visit: Payer: Self-pay

## 2019-10-20 ENCOUNTER — Telehealth (HOSPITAL_COMMUNITY): Payer: Self-pay | Admitting: *Deleted

## 2019-10-20 ENCOUNTER — Ambulatory Visit (INDEPENDENT_AMBULATORY_CARE_PROVIDER_SITE_OTHER): Payer: BC Managed Care – PPO | Admitting: Nurse Practitioner

## 2019-10-20 ENCOUNTER — Encounter: Payer: Self-pay | Admitting: Nurse Practitioner

## 2019-10-20 VITALS — BP 150/90 | HR 88 | Ht 66.0 in | Wt 198.0 lb

## 2019-10-20 DIAGNOSIS — R079 Chest pain, unspecified: Secondary | ICD-10-CM | POA: Diagnosis not present

## 2019-10-20 DIAGNOSIS — R002 Palpitations: Secondary | ICD-10-CM | POA: Diagnosis not present

## 2019-10-20 DIAGNOSIS — I10 Essential (primary) hypertension: Secondary | ICD-10-CM

## 2019-10-20 DIAGNOSIS — Z01818 Encounter for other preprocedural examination: Secondary | ICD-10-CM

## 2019-10-20 NOTE — Patient Instructions (Addendum)
Medication Instructions:  *If you need a refill on your cardiac medications before your next appointment, please call your pharmacy*  Testing/Procedures: Your physician has requested that you have a lexiscan myoview. For further information please visit [email or web address] Please follow instruction sheet, as given.   Follow-Up: At [facility], you and your health needs are our priority.  As part of our continuing mission to provide you with exceptional heart care, we have created designated Provider Care Teams.  These Care Teams include your primary Cardiologist (physician) and Advanced Practice Providers (APPs -  Physician Assistants and Nurse Practitioners) who all work together to provide you with the care you need, when you need it.  We recommend signing up for the patient portal called "MyChart".  Sign up information is provided on this After Visit Summary.  MyChart is used to connect with patients for Virtual Visits (Telemedicine).  Patients are able to view lab/test results, encounter notes, upcoming appointments, etc.  Non-urgent messages can be sent to your provider as well.   To learn more about what you can do with MyChart, go to [email or web address].    Your next appointment:   Your physician recommends that you follow up as needed

## 2019-10-20 NOTE — Telephone Encounter (Signed)
Left message on voicemail per DPR in reference to upcoming appointment scheduled on 10/26/19 with detailed instructions given per Myocardial Perfusion Study Information Sheet for the test. LM to arrive 15 minutes early, and that it is imperative to arrive on time for appointment to keep from having the test rescheduled. If you need to cancel or reschedule your appointment, please call the office within 24 hours of your appointment. Failure to do so may result in a cancellation of your appointment, and a $50 no show fee. Phone number given for call back for any questions. Gavriella Hearst Jacqueline ° ° ° °

## 2019-10-20 NOTE — Progress Notes (Deleted)
Office Visit Note  Patient: Marissa Larson             Date of Birth: 02/23/1955           MRN: [id number]             PCP: Mayer Masker, PA-C Referring: Mayer Masker, PA-C Visit Date: 10/21/2019 Occupation: @GUAROCC @  Subjective:    History of Present Illness: Marissa Larson is a 64 y.o. female with history of inflammatory arthritis and pseudogout.  She is taking plaquenil 200 mg 1 tablet by mouth daily.  She could not tolerate taking PLQ 200 mg BID M-F.   CBC and BMP updated on 10/13/19.    Activities of Daily Living:  Patient reports morning stiffness for   {minute/hour:19697}.   Patient {ACTIONS;DENIES/REPORTS:21021675::"Denies"} nocturnal pain.  Difficulty dressing/grooming: {ACTIONS;DENIES/REPORTS:21021675::"Denies"} Difficulty climbing stairs: {ACTIONS;DENIES/REPORTS:21021675::"Denies"} Difficulty getting out of chair: {ACTIONS;DENIES/REPORTS:21021675::"Denies"} Difficulty using hands for taps, buttons, cutlery, and/or writing: {ACTIONS;DENIES/REPORTS:21021675::"Denies"}  Review of Systems  Constitutional: Negative for fatigue.  HENT: Negative for mouth sores, mouth dryness and nose dryness.   Eyes: Negative for pain, visual disturbance and dryness.  Respiratory: Negative for cough, hemoptysis, shortness of breath and difficulty breathing.   Cardiovascular: Negative for chest pain, palpitations, hypertension and swelling in legs/feet.  Gastrointestinal: Negative for blood in stool, constipation and diarrhea.  Endocrine: Negative for increased urination.  Genitourinary: Negative for painful urination.  Musculoskeletal: Negative for arthralgias, joint pain, joint swelling, myalgias, muscle weakness, morning stiffness, muscle tenderness and myalgias.  Skin: Negative for color change, pallor, rash, hair loss, nodules/bumps, skin tightness, ulcers and sensitivity to sunlight.  Allergic/Immunologic: Negative for susceptible to infections.  Neurological: Negative for  dizziness, numbness, headaches and weakness.  Hematological: Negative for swollen glands.  Psychiatric/Behavioral: Negative for depressed mood and sleep disturbance. The patient is not nervous/anxious.     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   Obesity, Class I, BMI 30-34.9 12/03/2016   Kidney stones 09/12/2016   Nausea 09/12/2016   High risk medication use 07/30/2016   NAFLD (nonalcoholic fatty liver disease) 08/01/2016   Primary osteoarthritis of both feet 04/12/2016   History of kidney stones 04/12/2016   Primary osteoarthritis of both hands 04/11/2016   Bilateral primary osteoarthritis of hip 04/11/2016   Primary osteoarthritis of both knees 04/11/2016   Spondylosis of lumbar region without myelopathy or radiculopathy 04/11/2016   Hyperuricemia 04/11/2016   Primary insomnia 04/11/2016   Flank pain 04/02/2016   Sinusitis 04/02/2016   Elevated HDL 11/11/2015   Elevated LFTs 11/11/2015   Adjustment disorder with mixed anxiety and depressed mood 11/11/2015   Dysuria 11/11/2015   Hypothyroidism 10/14/2015   Pseudogout involving multiple joints 10/14/2015   Vitamin D deficiency 10/14/2015   Hormone replacement therapy- per GYN 10/14/2015   HTN (hypertension) 10/14/2015   Fatigue 10/14/2015   h/o Hiatal hernia 10/14/2015   Sleep difficulties 10/14/2015   Generalized OA 10/14/2015   Counseling on health promotion and disease prevention 10/14/2015   Gastric ulcer- due to Mobic 10/09/2015   Age-related  nuclear cataract of both eyes 01/25/2015  Posterior vitreous detachment of left eye 01/25/2015   chronic Palpitations 07/19/2013    Past Medical History:  Diagnosis Date   Arthritis    Chest pain 06/2013   Left sided   Chronic leg pain 06/2013   Complication of anesthesia    Dysrhythmia    Fatigue    GERD (gastroesophageal reflux disease)    History of kidney stones    Hypertension    Hypothyroidism    Nonproductive cough 06/2013   Overweight 01/18/2014   Peripheral neuropathy    PONV (postoperative nausea and vomiting)    Sleep difficulties    Change in sleep patterns   SOB (shortness of breath) 06/2013   Tachycardia 06/2013   Thyroid disease    hypo   UTI (lower urinary tract infection)    Vesico-ureteric reflux     Family History  Problem Relation Age of Onset   Migraines Other        Fam Hx    Heart disease Other        Fam Hx   Thyroid disease Other        Fam Hx - She also has thyroid disease   Diabetes Other        Fam Hx   Arthritis Other        Fam Hx   Gallbladder disease Other        Fam Hx   Cancer Other        Fam Hx - Pancreatic   Hypertension Other        Fam Hx   Heart attack Cousin        Multiple cousins with MI's before 63 years of age   Heart disease Mother 21   Hyperlipidemia Mother 14   Hypertension Mother 47   Stroke Mother 22   Heart disease Maternal Grandmother    Cancer Father 49       pancreatic   Diabetes Brother 50   Heart attack Maternal Uncle    Heart attack Paternal Aunt    Past Surgical History:  Procedure Laterality Date   ABDOMINAL SURGERY  1994   CYSTOSCOPY/URETEROSCOPY/HOLMIUM LASER/STENT PLACEMENT Left 09/22/2017   Procedure: CYSTOSCOPY/RETROGRADE/URETEROSCOPY/ BASKET STONE EXTRACTION/STENT PLACEMENT;  Surgeon: Rene Paci, MD;  Location: [facility];  Service: Urology;  Laterality: Left;  ONLY NEEDS 60 MIN   FOOT SURGERY Right 2009   KIDNEY SURGERY Left 1993    KNEE SURGERY     Multiple knee surgeries   OOPHORECTOMY Left 1994   REPLACEMENT TOTAL KNEE Left 10/13/2018   Dr. Thurston Hole   small fiber neuropathy biopsy  12/2017   TONSILLECTOMY     URETEROSCOPY VIA URETEROSTOMY Left 1994   Social History   Social History Narrative   Not on file   Immunization History  Administered Date(s) Administered   Tdap 01/07/2011     Objective: Vital Signs: There were no vitals taken for this visit.   Physical Exam Vitals and nursing note reviewed.  Constitutional:      Appearance: She is well-developed.  HENT:     Head: Normocephalic and atraumatic.  Eyes:     Conjunctiva/sclera: Conjunctivae normal.  Pulmonary:     Effort: Pulmonary effort is normal.  Abdominal:     Palpations: Abdomen is soft.  Musculoskeletal:     Cervical back: Normal range of motion.  Skin:    General: Skin is warm and dry.     Capillary Refill: Capillary refill takes less than 2 seconds.  Neurological:  Mental Status: She is alert and oriented to person, place, and time.  Psychiatric:        Behavior: Behavior normal.      Musculoskeletal Exam: ***  CDAI Exam: CDAI Score: -- Patient Global: --; Provider Global: -- Swollen: --; Tender: -- Joint Exam 10/21/2019   No joint exam has been documented for this visit   There is currently no information documented on the homunculus. Go to the Rheumatology activity and complete the homunculus joint exam.  Investigation: No additional findings.  Imaging: DG Chest 2 View  Result Date: 10/13/2019 CLINICAL DATA:  64 year old female with chest pain and hypertension. EXAM: CHEST - 2 VIEW COMPARISON:  CT Chest, Abdomen, and Pelvis 03/12/2017 and earlier. FINDINGS: Lung volumes and mediastinal contours are stable since 2015. Mildly tortuous thoracic aorta. Both lungs appear stable and clear. No pneumothorax or pleural effusion. No acute osseous abnormality identified. Negative visible bowel gas pattern. IMPRESSION:  No acute cardiopulmonary abnormality. Electronically Signed   By: Odessa FlemingH  Hall M.D.   On: 10/13/2019 11:46   US Venous Img Lower Unilateral Right (DVT)  Result Date: 10/13/2019 CLINICAL DATA:  Right lower extremity pain and edema. Evaluate for DVT. EXAM: RIGHT LOWER EXTREMITY VENOUS DOPPLER ULTRASOUND TECHNIQUE: Gray-scale sonography with graded compression, as well as color Doppler and duplex ultrasound were performed to evaluate the lower extremity deep venous systems from the level of the common femoral vein and including the common femoral, femoral, profunda femoral, popliteal and calf veins including the posterior tibial, peroneal and gastrocnemius veins when visible. The superficial great saphenous vein was also interrogated. Spectral Doppler was utilized to evaluate flow at rest and with distal augmentation maneuvers in the common femoral, femoral and popliteal veins. COMPARISON:  None. FINDINGS: Contralateral Common Femoral Vein: Respiratory phasicity is normal and symmetric with the symptomatic side. No evidence of thrombus. Normal compressibility. Common Femoral Vein: No evidence of thrombus. Normal compressibility, respiratory phasicity and response to augmentation. Saphenofemoral Junction: No evidence of thrombus. Normal compressibility and flow on color Doppler imaging. Profunda Femoral Vein: No evidence of thrombus. Normal compressibility and flow on color Doppler imaging. Femoral Vein: No evidence of thrombus. Normal compressibility, respiratory phasicity and response to augmentation. Popliteal Vein: No evidence of thrombus. Normal compressibility, respiratory phasicity and response to augmentation. Calf Veins: No evidence of thrombus. Normal compressibility and flow on color Doppler imaging. Superficial Great Saphenous Vein: No evidence of thrombus. Normal compressibility. Venous Reflux:  None. Other Findings:  None. IMPRESSION: No evidence of DVT within the right lower extremity. Electronically  Signed   By: Simonne ComeJohn  Watts M.D.   On: 10/13/2019 13:20    Recent Labs: Lab Results  Component Value Date   WBC 9.3 10/13/2019   HGB 15.1 (H) 10/13/2019   PLT 239 10/13/2019   NA 136 10/13/2019   K 3.7 10/13/2019   CL 101 10/13/2019   CO2 25 10/13/2019   GLUCOSE 100 (H) 10/13/2019   BUN 19 10/13/2019   CREATININE 0.45 10/13/2019   BILITOT 0.3 08/17/2019   ALKPHOS 92 08/17/2019   AST 27 08/17/2019   ALT 25 08/17/2019   PROT 6.9 08/17/2019   ALBUMIN 4.7 08/17/2019   CALCIUM 9.6 10/13/2019   GFRAA 108 08/17/2019   QFTBGOLDPLUS NEGATIVE 02/16/2017    Speciality Comments: PLQ eye exam: 04/20/2019 normal. Dr. Burgess Estelleanner. Follow up in 1 year.  Procedures:  No procedures performed Allergies: Penicillins and Sulfa antibiotics   Assessment / Plan:     Visit Diagnoses: No diagnosis found.  Orders:  No orders of the defined types were placed in this encounter.  No orders of the defined types were placed in this encounter.   Face-to-face time spent with patient was *** minutes. Greater than 50% of time was spent in counseling and coordination of care.  Follow-Up Instructions: No follow-ups on file.   Ellen Henri, CMA  Note - This record has been created using Animal nutritionist.  Chart creation errors have been sought, but may not always  have been located. Such creation errors do not reflect on  the standard of medical care.

## 2019-10-21 ENCOUNTER — Telehealth: Payer: Self-pay | Admitting: Rheumatology

## 2019-10-21 ENCOUNTER — Other Ambulatory Visit: Payer: Self-pay | Admitting: Rheumatology

## 2019-10-21 ENCOUNTER — Ambulatory Visit: Payer: BC Managed Care – PPO | Admitting: Physician Assistant

## 2019-10-21 DIAGNOSIS — G629 Polyneuropathy, unspecified: Secondary | ICD-10-CM

## 2019-10-21 DIAGNOSIS — M19042 Primary osteoarthritis, left hand: Secondary | ICD-10-CM

## 2019-10-21 DIAGNOSIS — E559 Vitamin D deficiency, unspecified: Secondary | ICD-10-CM

## 2019-10-21 DIAGNOSIS — M1189 Other specified crystal arthropathies, multiple sites: Secondary | ICD-10-CM

## 2019-10-21 DIAGNOSIS — M160 Bilateral primary osteoarthritis of hip: Secondary | ICD-10-CM

## 2019-10-21 DIAGNOSIS — E790 Hyperuricemia without signs of inflammatory arthritis and tophaceous disease: Secondary | ICD-10-CM

## 2019-10-21 DIAGNOSIS — Z87442 Personal history of urinary calculi: Secondary | ICD-10-CM

## 2019-10-21 DIAGNOSIS — M1711 Unilateral primary osteoarthritis, right knee: Secondary | ICD-10-CM

## 2019-10-21 DIAGNOSIS — F5101 Primary insomnia: Secondary | ICD-10-CM

## 2019-10-21 DIAGNOSIS — M19071 Primary osteoarthritis, right ankle and foot: Secondary | ICD-10-CM

## 2019-10-21 DIAGNOSIS — Z96652 Presence of left artificial knee joint: Secondary | ICD-10-CM

## 2019-10-21 DIAGNOSIS — Z79899 Other long term (current) drug therapy: Secondary | ICD-10-CM

## 2019-10-21 DIAGNOSIS — M47816 Spondylosis without myelopathy or radiculopathy, lumbar region: Secondary | ICD-10-CM

## 2019-10-21 DIAGNOSIS — K760 Fatty (change of) liver, not elsewhere classified: Secondary | ICD-10-CM

## 2019-10-21 DIAGNOSIS — I10 Essential (primary) hypertension: Secondary | ICD-10-CM

## 2019-10-21 DIAGNOSIS — M1990 Unspecified osteoarthritis, unspecified site: Secondary | ICD-10-CM

## 2019-10-21 DIAGNOSIS — K449 Diaphragmatic hernia without obstruction or gangrene: Secondary | ICD-10-CM

## 2019-10-21 NOTE — Telephone Encounter (Signed)
Attempted to contact the patient and left message for patient to call the office.  

## 2019-10-21 NOTE — Telephone Encounter (Signed)
Patient calling in reference to upcoming appointment. Patient has a question she would like for you to relay to doctor for her upcoming appointment. Please call to discuss.

## 2019-10-21 NOTE — Telephone Encounter (Signed)
Patient states she has an appointment on 10/27/2019. Patient asked if Dr. Corliss Skains would provide her an exemption for the Covid 19 vaccine because she is on immunosuppressants. Patient advised that it is highly recommended for her to have the Covid 19 vaccine because she is immunocompromised and on immunosuppressants. Patient advised Dr. Corliss Skains would encourage the vaccine and would not provide an exemption. Patient expressed understanding.

## 2019-10-21 NOTE — Telephone Encounter (Signed)
Last Visit: 07/26/2019 Next Visit:10/10/2019 Labs: 08/17/2019 Glucose 101 rest of CMP and CBC WNL  Current Dose per office note 07/26/2019:colchicine to 0.6 mg 1 capsule by mouth daily Dx: Pseudogout involving multiple joints   Okay to refill per Dr. Corliss Skains

## 2019-10-24 NOTE — Progress Notes (Deleted)
Office Visit Note  Patient: Marissa Larson             Date of Birth: 11/13/1955           MRN: [id number]             PCP: Mayer Masker, PA-C Referring: Mayer Masker, PA-C Visit Date: 10/27/2019 Occupation: @GUAROCC @  Subjective:  No chief complaint on file.   History of Present Illness: Marissa Larson is a 64 y.o. female ***   Activities of Daily Living:  Patient reports morning stiffness for *** {minute/hour:19697}.   Patient {ACTIONS;DENIES/REPORTS:21021675::"Denies"} nocturnal pain.  Difficulty dressing/grooming: {ACTIONS;DENIES/REPORTS:21021675::"Denies"} Difficulty climbing stairs: {ACTIONS;DENIES/REPORTS:21021675::"Denies"} Difficulty getting out of chair: {ACTIONS;DENIES/REPORTS:21021675::"Denies"} Difficulty using hands for taps, buttons, cutlery, and/or writing: {ACTIONS;DENIES/REPORTS:21021675::"Denies"}  No Rheumatology ROS completed.   PMFS History:  Patient Active Problem List   Diagnosis Date Noted  . Elevated LDL cholesterol level 07/19/2018  . Prediabetes 07/19/2018  . Paresthesia 12/09/2017  . Chronic insomnia 11/24/2017  . Insomnia secondary to chronic pain 11/24/2017  . Complaint related to dreams 11/24/2017  . 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 Neuropathy 08/18/2017  . 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 Obesity, Class I, BMI 30-34.9 12/03/2016  . Kidney stones 09/12/2016  . Nausea 09/12/2016  . High risk medication use 07/30/2016  . NAFLD (nonalcoholic fatty liver disease) 08/01/2016  . Primary osteoarthritis of both feet 04/12/2016  . History of kidney stones 04/12/2016  . Primary osteoarthritis of both hands 04/11/2016  . Bilateral primary osteoarthritis of hip 04/11/2016  . Primary osteoarthritis of both knees 04/11/2016  . Spondylosis of lumbar region without myelopathy or radiculopathy 04/11/2016  . Hyperuricemia 04/11/2016    . Primary insomnia 04/11/2016  . Flank pain 04/02/2016  . Sinusitis 04/02/2016  . Elevated HDL 11/11/2015  . Elevated LFTs 11/11/2015  . Adjustment disorder with mixed anxiety and depressed mood 11/11/2015  . Dysuria 11/11/2015  . Hypothyroidism 10/14/2015  . Pseudogout involving multiple joints 10/14/2015  . Vitamin D deficiency 10/14/2015  . Hormone replacement therapy- per GYN 10/14/2015  . HTN (hypertension) 10/14/2015  . Fatigue 10/14/2015  . h/o Hiatal hernia 10/14/2015  . Sleep difficulties 10/14/2015  . Generalized OA 10/14/2015  . Counseling on health promotion and disease prevention 10/14/2015  . 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 Age-related nuclear cataract of both eyes 01/25/2015  . Posterior vitreous detachment of left eye 01/25/2015  . chronic Palpitations 07/19/2013    Past Medical History:  Diagnosis Date  . Arthritis   . Chest pain 06/2013   Left sided  . Chronic leg pain 06/2013  . Complication of anesthesia   . Dysrhythmia   . Fatigue   . GERD (gastroesophageal reflux disease)   . History of kidney stones   . Hypertension   . Hypothyroidism   . Nonproductive cough 06/2013  . Overweight 01/18/2014  . Peripheral neuropathy   . PONV (postoperative nausea and vomiting)   . Sleep difficulties    Change in sleep patterns  . SOB (shortness of breath) 06/2013  . Tachycardia 06/2013  . Thyroid disease    hypo  . UTI (lower urinary tract infection)   . Vesico-ureteric reflux     Family History  Problem Relation Age of Onset  . Migraines Other        Fam Hx   . Heart disease Other  Fam Hx  . Thyroid disease Other        Fam Hx - She also has thyroid disease  . Diabetes Other        Fam Hx  . Arthritis Other        Fam Hx  . Gallbladder disease Other        Fam Hx  . Cancer Other        Fam Hx - Pancreatic  . Hypertension Other        Fam Hx  . Heart attack Cousin        Multiple cousins with MI's before 59 years of age  . Heart  disease Mother 54  . Hyperlipidemia Mother 79  . Hypertension Mother 60  . Stroke Mother 19  . Heart disease Maternal Grandmother   . Cancer Father 84       pancreatic  . Diabetes Brother 50  . Heart attack Maternal Uncle   . Heart attack Paternal Aunt    Past Surgical History:  Procedure Laterality Date  . ABDOMINAL SURGERY  1994  . CYSTOSCOPY/URETEROSCOPY/HOLMIUM LASER/STENT PLACEMENT Left 09/22/2017   Procedure: CYSTOSCOPY/RETROGRADE/URETEROSCOPY/ BASKET STONE EXTRACTION/STENT PLACEMENT;  Surgeon: Rene Paci, MD;  Location: [facility];  Service: Urology;  Laterality: Left;  ONLY NEEDS 60 MIN  . FOOT SURGERY Right 2009  . KIDNEY SURGERY Left 1993  . KNEE SURGERY     Multiple knee surgeries  . OOPHORECTOMY Left 1994  . 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 Thurston Hole  . small fiber neuropathy biopsy  12/2017  . TONSILLECTOMY    . URETEROSCOPY VIA URETEROSTOMY Left 1994   Social History   Social History Narrative  . Not on file   Immunization History  Administered Date(s) Administered  . Tdap 01/07/2011     Objective: Vital Signs: There were no vitals taken for this visit.   Physical Exam   Musculoskeletal Exam: ***  CDAI Exam: CDAI Score: -- Patient Global: --; Provider Global: -- Swollen: --; Tender: -- Joint Exam 10/27/2019   No joint exam has been documented for this visit   There is currently no information documented on the homunculus. Go to the Rheumatology activity and complete the homunculus joint exam.  Investigation: No additional findings.  Imaging: DG Chest 2 View  Result Date: 10/13/2019 CLINICAL DATA:  64 year old female with chest pain and hypertension. EXAM: CHEST - 2 VIEW COMPARISON:  CT Chest, Abdomen, and Pelvis 03/12/2017 and earlier. FINDINGS: Lung volumes and mediastinal contours are stable since 2015. Mildly tortuous thoracic aorta. Both lungs appear stable and clear. No pneumothorax or pleural effusion. No acute osseous  abnormality identified. Negative visible bowel gas pattern. IMPRESSION: No acute cardiopulmonary abnormality. Electronically Signed   By: Odessa Fleming M.D.   On: 10/13/2019 11:46   US Venous Img Lower Unilateral Right (DVT)  Result Date: 10/13/2019 CLINICAL DATA:  Right lower extremity pain and edema. Evaluate for DVT. EXAM: RIGHT LOWER EXTREMITY VENOUS DOPPLER ULTRASOUND TECHNIQUE: Gray-scale sonography with graded compression, as well as color Doppler and duplex ultrasound were performed to evaluate the lower extremity deep venous systems from the level of the common femoral vein and including the common femoral, femoral, profunda femoral, popliteal and calf veins including the posterior tibial, peroneal and gastrocnemius veins when visible. The superficial great saphenous vein was also interrogated. Spectral Doppler was utilized to evaluate flow at rest and with distal augmentation maneuvers in the common femoral, femoral and popliteal veins. COMPARISON:  None. FINDINGS: Contralateral Common  Femoral Vein: Respiratory phasicity is normal and symmetric with the symptomatic side. No evidence of thrombus. Normal compressibility. Common Femoral Vein: No evidence of thrombus. Normal compressibility, respiratory phasicity and response to augmentation. Saphenofemoral Junction: No evidence of thrombus. Normal compressibility and flow on color Doppler imaging. Profunda Femoral Vein: No evidence of thrombus. Normal compressibility and flow on color Doppler imaging. Femoral Vein: No evidence of thrombus. Normal compressibility, respiratory phasicity and response to augmentation. Popliteal Vein: No evidence of thrombus. Normal compressibility, respiratory phasicity and response to augmentation. Calf Veins: No evidence of thrombus. Normal compressibility and flow on color Doppler imaging. Superficial Great Saphenous Vein: No evidence of thrombus. Normal compressibility. Venous Reflux:  None. Other Findings:  None. IMPRESSION:  No evidence of DVT within the right lower extremity. Electronically Signed   By: Simonne Come M.D.   On: 10/13/2019 13:20    Recent Labs: Lab Results  Component Value Date   WBC 9.3 10/13/2019   HGB 15.1 (H) 10/13/2019   PLT 239 10/13/2019   NA 136 10/13/2019   K 3.7 10/13/2019   CL 101 10/13/2019   CO2 25 10/13/2019   GLUCOSE 100 (H) 10/13/2019   BUN 19 10/13/2019   CREATININE 0.45 10/13/2019   BILITOT 0.3 08/17/2019   ALKPHOS 92 08/17/2019   AST 27 08/17/2019   ALT 25 08/17/2019   PROT 6.9 08/17/2019   ALBUMIN 4.7 08/17/2019   CALCIUM 9.6 10/13/2019   GFRAA 108 08/17/2019   QFTBGOLDPLUS NEGATIVE 02/16/2017    Speciality Comments: PLQ eye exam: 04/20/2019 normal. Dr. Burgess Estelle. Follow up in 1 year.  Procedures:  No procedures performed Allergies: Penicillins and Sulfa antibiotics   Assessment / Plan:     Visit Diagnoses: No diagnosis found.  Orders: No orders of the defined types were placed in this encounter.  No orders of the defined types were placed in this encounter.   Face-to-face time spent with patient was *** minutes. Greater than 50% of time was spent in counseling and coordination of care.  Follow-Up Instructions: No follow-ups on file.   Ellen Henri, CMA  Note - This record has been created using Animal nutritionist.  Chart creation errors have been sought, but may not always  have been located. Such creation errors do not reflect on  the standard of medical care.

## 2019-10-26 ENCOUNTER — Ambulatory Visit (HOSPITAL_COMMUNITY): Payer: BC Managed Care – PPO | Attending: Nurse Practitioner

## 2019-10-26 ENCOUNTER — Other Ambulatory Visit: Payer: Self-pay

## 2019-10-26 ENCOUNTER — Ambulatory Visit: Payer: BC Managed Care – PPO | Admitting: Physician Assistant

## 2019-10-26 DIAGNOSIS — Z79899 Other long term (current) drug therapy: Secondary | ICD-10-CM

## 2019-10-26 DIAGNOSIS — Z96652 Presence of left artificial knee joint: Secondary | ICD-10-CM

## 2019-10-26 DIAGNOSIS — N390 Urinary tract infection, site not specified: Secondary | ICD-10-CM | POA: Diagnosis not present

## 2019-10-26 DIAGNOSIS — R079 Chest pain, unspecified: Secondary | ICD-10-CM | POA: Diagnosis not present

## 2019-10-26 DIAGNOSIS — G629 Polyneuropathy, unspecified: Secondary | ICD-10-CM

## 2019-10-26 DIAGNOSIS — K449 Diaphragmatic hernia without obstruction or gangrene: Secondary | ICD-10-CM

## 2019-10-26 DIAGNOSIS — F5101 Primary insomnia: Secondary | ICD-10-CM

## 2019-10-26 DIAGNOSIS — Z87442 Personal history of urinary calculi: Secondary | ICD-10-CM

## 2019-10-26 DIAGNOSIS — M19071 Primary osteoarthritis, right ankle and foot: Secondary | ICD-10-CM

## 2019-10-26 DIAGNOSIS — M1189 Other specified crystal arthropathies, multiple sites: Secondary | ICD-10-CM

## 2019-10-26 DIAGNOSIS — M19041 Primary osteoarthritis, right hand: Secondary | ICD-10-CM

## 2019-10-26 DIAGNOSIS — M1990 Unspecified osteoarthritis, unspecified site: Secondary | ICD-10-CM

## 2019-10-26 DIAGNOSIS — I10 Essential (primary) hypertension: Secondary | ICD-10-CM

## 2019-10-26 DIAGNOSIS — M1711 Unilateral primary osteoarthritis, right knee: Secondary | ICD-10-CM

## 2019-10-26 DIAGNOSIS — K760 Fatty (change of) liver, not elsewhere classified: Secondary | ICD-10-CM

## 2019-10-26 DIAGNOSIS — E790 Hyperuricemia without signs of inflammatory arthritis and tophaceous disease: Secondary | ICD-10-CM

## 2019-10-26 DIAGNOSIS — M160 Bilateral primary osteoarthritis of hip: Secondary | ICD-10-CM

## 2019-10-26 DIAGNOSIS — M47816 Spondylosis without myelopathy or radiculopathy, lumbar region: Secondary | ICD-10-CM

## 2019-10-26 DIAGNOSIS — E559 Vitamin D deficiency, unspecified: Secondary | ICD-10-CM

## 2019-10-26 LAB — MYOCARDIAL PERFUSION IMAGING
LV dias vol: 75 mL (ref 46–106)
LV sys vol: 23 mL
Peak HR: 109 {beats}/min
Rest HR: 74 {beats}/min
SDS: 0
SRS: 0
SSS: 0
TID: 0.88

## 2019-10-26 MED ORDER — TECHNETIUM TC 99M TETROFOSMIN IV KIT
32.4000 | PACK | Freq: Once | INTRAVENOUS | Status: AC | PRN
  Administered 2019-10-26: 32.4 via INTRAVENOUS
  Filled 2019-10-26: qty 33

## 2019-10-26 MED ORDER — REGADENOSON 0.4 MG/5ML IV SOLN
0.4000 mg | Freq: Once | INTRAVENOUS | Status: AC
  Administered 2019-10-26: 0.4 mg via INTRAVENOUS

## 2019-10-26 MED ORDER — TECHNETIUM TC 99M TETROFOSMIN IV KIT
10.4000 | PACK | Freq: Once | INTRAVENOUS | Status: AC | PRN
  Administered 2019-10-26: 10.4 via INTRAVENOUS
  Filled 2019-10-26: qty 11

## 2019-10-27 ENCOUNTER — Ambulatory Visit: Payer: BC Managed Care – PPO | Admitting: Physician Assistant

## 2019-10-27 DIAGNOSIS — M1189 Other specified crystal arthropathies, multiple sites: Secondary | ICD-10-CM

## 2019-10-27 DIAGNOSIS — Z96652 Presence of left artificial knee joint: Secondary | ICD-10-CM

## 2019-10-27 DIAGNOSIS — K449 Diaphragmatic hernia without obstruction or gangrene: Secondary | ICD-10-CM

## 2019-10-27 DIAGNOSIS — Z87442 Personal history of urinary calculi: Secondary | ICD-10-CM

## 2019-10-27 DIAGNOSIS — E559 Vitamin D deficiency, unspecified: Secondary | ICD-10-CM

## 2019-10-27 DIAGNOSIS — M1990 Unspecified osteoarthritis, unspecified site: Secondary | ICD-10-CM

## 2019-10-27 DIAGNOSIS — F5101 Primary insomnia: Secondary | ICD-10-CM

## 2019-10-27 DIAGNOSIS — Z79899 Other long term (current) drug therapy: Secondary | ICD-10-CM

## 2019-10-27 DIAGNOSIS — G629 Polyneuropathy, unspecified: Secondary | ICD-10-CM

## 2019-10-27 DIAGNOSIS — E790 Hyperuricemia without signs of inflammatory arthritis and tophaceous disease: Secondary | ICD-10-CM

## 2019-10-27 DIAGNOSIS — M47816 Spondylosis without myelopathy or radiculopathy, lumbar region: Secondary | ICD-10-CM

## 2019-10-27 DIAGNOSIS — M19071 Primary osteoarthritis, right ankle and foot: Secondary | ICD-10-CM

## 2019-10-27 DIAGNOSIS — K760 Fatty (change of) liver, not elsewhere classified: Secondary | ICD-10-CM

## 2019-10-27 DIAGNOSIS — I10 Essential (primary) hypertension: Secondary | ICD-10-CM

## 2019-10-27 DIAGNOSIS — M160 Bilateral primary osteoarthritis of hip: Secondary | ICD-10-CM

## 2019-10-27 DIAGNOSIS — M19041 Primary osteoarthritis, right hand: Secondary | ICD-10-CM

## 2019-10-27 DIAGNOSIS — M1711 Unilateral primary osteoarthritis, right knee: Secondary | ICD-10-CM

## 2019-10-28 ENCOUNTER — Other Ambulatory Visit: Payer: Self-pay | Admitting: Physician Assistant

## 2019-10-28 ENCOUNTER — Telehealth: Payer: Self-pay | Admitting: Internal Medicine

## 2019-10-28 ENCOUNTER — Telehealth: Payer: Self-pay | Admitting: Physician Assistant

## 2019-10-28 NOTE — Telephone Encounter (Signed)
Patient would like to know whether or not she has been cleared to have her procedure. Please advise.

## 2019-10-28 NOTE — Telephone Encounter (Signed)
Pt called back to check on her clearance. I s/w the pt and informed we are needing to confirm with Gypsy Balsam, NP who she saw if echo is still needed for pre op clearance, per ov note pt to have Myoview and Echo, though only Myoview was done. I assured the pt that we did send a message to NP to please confirm if still needing echo or if the pt is cleared. Pt's surgery is scheduled for 11/02/19.

## 2019-10-28 NOTE — Telephone Encounter (Signed)
Hi Marissa Larson,   You recently saw [name] for pre-op clearance and ordered a Myoview. Myoview was low risk with no evidence of ischemia. EF >65. I saw your note also mentioned getting an Echo but I do not see an order for this. Just wanted to check to see if you wanted her to have an Echo prior to surgery or is she OK to proceed given low risk Myoview?  Thanks so much! [name]

## 2019-10-28 NOTE — Telephone Encounter (Signed)
Patient called and left VM asking to speak with Jake Samples about an unspecified concern.issue. She did not give details of the need for a call on VM.

## 2019-10-28 NOTE — Telephone Encounter (Signed)
Follow up:     Patient calling to check the status of her medical clearance. Patient states no has called. Please call the patient.

## 2019-10-31 ENCOUNTER — Telehealth: Payer: Self-pay

## 2019-10-31 NOTE — Telephone Encounter (Signed)
-----   Message from Marissa Lente, NP sent at 10/29/2019  6:46 AM EDT ----- Please notify patient of low risk myoview, ok to proceed with surgery.

## 2019-10-31 NOTE — Telephone Encounter (Signed)
   Primary Cardiologist: Dr. Taylor  Chart reviewed as part of pre-operative protocol coverage. Patient recently seen by Amber Seiler, NP, on 10/20/2019 for pre-op evaluation. At that time she described some atypical chest pain. Stress test was ordered and came back low risk with no evidence of infarction or ischemia. LVEF 69%. Per Amber, OK to proceed with procedure.   I will route this recommendation to the requesting party via Epic fax function and remove from pre-op pool.  Please call with questions.  Marissa Hatchell E Bahja Bence, PA-C 10/31/2019, 11:32 AM 

## 2019-10-31 NOTE — Telephone Encounter (Signed)
   Primary Cardiologist: Dr. Ladona Ridgel  Chart reviewed as part of pre-operative protocol coverage. Patient recently seen by Gypsy Balsam, NP, on 10/20/2019 for pre-op evaluation. At that time she described some atypical chest pain. Stress test was ordered and came back low risk with no evidence of infarction or ischemia. LVEF 69%. Per [facility], OK to proceed with procedure.   I will route this recommendation to the requesting party via Epic fax function and remove from pre-op pool.  Please call with questions.  Corrin Parker, PA-C 10/31/2019, 11:32 AM

## 2019-10-31 NOTE — Telephone Encounter (Signed)
Pt has been cleared for her surgery. We will fax clearance notes to requesting office. Pt has been advised she has been cleared. Pt thanked our office for all of our help.

## 2019-10-31 NOTE — Telephone Encounter (Signed)
Patient called and asked to speak with Okey Regal. She is looking for an update on medical clearance. Please call/advise.   Thank you!

## 2019-10-31 NOTE — Telephone Encounter (Signed)
Pt is aware and agreeable to normal study and clearance for procedure

## 2019-11-02 DIAGNOSIS — M1711 Unilateral primary osteoarthritis, right knee: Secondary | ICD-10-CM | POA: Diagnosis not present

## 2019-11-02 HISTORY — PX: KNEE ARTHROPLASTY: SHX992

## 2019-11-03 NOTE — Telephone Encounter (Signed)
I have been out of the office and this message was sent to my direct mail box.   Called patient and left message to call back. AS, CMA

## 2019-11-11 ENCOUNTER — Ambulatory Visit: Payer: BC Managed Care – PPO | Admitting: Internal Medicine

## 2019-11-15 ENCOUNTER — Telehealth: Payer: Self-pay | Admitting: Physician Assistant

## 2019-11-15 ENCOUNTER — Other Ambulatory Visit: Payer: Self-pay | Admitting: Physician Assistant

## 2019-11-15 NOTE — Telephone Encounter (Signed)
Patient requesting refill of Hydoxyzine. Patient made aware too soon to refill. Patient would like to discuss Anxiety. Scheduled apt for 12/19/19 with Maritza. AS, CMA

## 2019-11-15 NOTE — Telephone Encounter (Signed)
Patient called to ask a question about a prescription that was sent in. Thanks

## 2019-11-23 ENCOUNTER — Ambulatory Visit: Payer: BC Managed Care – PPO | Admitting: Physician Assistant

## 2019-11-28 ENCOUNTER — Other Ambulatory Visit: Payer: Self-pay | Admitting: Physician Assistant

## 2019-11-30 ENCOUNTER — Other Ambulatory Visit: Payer: Self-pay | Admitting: Physician Assistant

## 2019-11-30 DIAGNOSIS — M1990 Unspecified osteoarthritis, unspecified site: Secondary | ICD-10-CM

## 2019-11-30 NOTE — Telephone Encounter (Signed)
Last Visit:07/26/2019 Next Visit: 12/12/2019 Labs: 08/17/2019 Glucose 101 rest of CMP and CBC WNL PLQ eye exam: 04/20/2019 normal.   Current Dose per note7/20/2021: could not tolerate Plaquenil twice daily Monday to Friday and reduce dose to 1 tablet p.o. daily. She believes the dose is not effective and she would like to increase the dose of Plaquenil to 1 tablet twice daily Monday to Friday.  DX: Inflammatory arthritis

## 2019-12-12 ENCOUNTER — Ambulatory Visit: Payer: BC Managed Care – PPO | Admitting: Physician Assistant

## 2019-12-14 NOTE — Progress Notes (Signed)
Office Visit Note  Patient: Marissa Larson             Date of Birth: November 02, 1955           MRN: [id number]             PCP: Mayer Masker, PA-C Referring: Mayer Masker, PA-C Visit Date: 12/15/2019 Occupation: @GUAROCC @  Subjective:  Discuss restarting medications   History of Present Illness: Shynia Daleo is a 64 y.o. female with history of inflammatory arthritis, osteoarthritis, and pseudogout.  She had a right knee total arthroplasty performed about 6 weeks ago by Dr. 68.  She continues to go to physical therapy and is working on reaching full extension.  She states the pain has started to become more tolerable and is typically a  3/10.  She states she could not tolerate taking celebrex or meloxicam, so she had to take a recent course of prednisone.  She states her generalized arthralgias improved significantly while taking prednisone.  She states she discontinued all of her medications, including PLQ and mitigare, prior to surgery and has not restarted yet.  She states prior to surgery she had a flare in multiple joints but has been doing okay since finishing the prednisone taper. She denies any pain or swelling in her hands or feet at this time.   She is open to restarting PLQ and mitigare.    Activities of Daily Living:  Patient reports morning stiffness for 30-45 minutes.   Patient Reports nocturnal pain.  Difficulty dressing/grooming: Denies Difficulty climbing stairs: Reports Difficulty getting out of chair: Reports Difficulty using hands for taps, buttons, cutlery, and/or writing: Denies  Review of Systems  Constitutional: Negative for fatigue.  HENT: Negative for mouth sores, mouth dryness and nose dryness.   Eyes: Negative for pain, visual disturbance and dryness.  Respiratory: Negative for cough, hemoptysis, shortness of breath and difficulty breathing.   Cardiovascular: Negative for chest pain, palpitations, hypertension and swelling in legs/feet.   Gastrointestinal: Negative for blood in stool, constipation and diarrhea.  Endocrine: Negative for increased urination.  Genitourinary: Negative for painful urination.  Musculoskeletal: Positive for arthralgias, joint pain, joint swelling, myalgias, muscle weakness, morning stiffness, muscle tenderness and myalgias.  Skin: Negative for color change, pallor, rash, hair loss, nodules/bumps, skin tightness, ulcers and sensitivity to sunlight.  Allergic/Immunologic: Negative for susceptible to infections.  Neurological: Negative for dizziness, numbness, headaches and weakness.  Hematological: Negative for swollen glands.  Psychiatric/Behavioral: Negative for depressed mood and sleep disturbance. The patient is nervous/anxious.     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   Obesity, Class I, BMI 30-34.9 12/03/2016   Kidney stones 09/12/2016   Nausea 09/12/2016   High risk medication use 07/30/2016   NAFLD (nonalcoholic fatty liver disease) 08/01/2016   Primary osteoarthritis of both feet 04/12/2016   History of kidney stones 04/12/2016   Primary osteoarthritis of both hands 04/11/2016   Bilateral primary osteoarthritis of hip 04/11/2016   Primary osteoarthritis of both knees 04/11/2016   Spondylosis of lumbar region without myelopathy or radiculopathy 04/11/2016  Hyperuricemia 04/11/2016   Primary insomnia 04/11/2016   Flank pain 04/02/2016   Sinusitis 04/02/2016   Elevated HDL 11/11/2015   Elevated LFTs 11/11/2015    Adjustment disorder with mixed anxiety and depressed mood 11/11/2015   Dysuria 11/11/2015   Hypothyroidism 10/14/2015   Pseudogout involving multiple joints 10/14/2015   Vitamin D deficiency 10/14/2015   Hormone replacement therapy- per GYN 10/14/2015   HTN (hypertension) 10/14/2015   Fatigue 10/14/2015   h/o Hiatal hernia 10/14/2015   Sleep difficulties 10/14/2015   Generalized OA 10/14/2015   Counseling on health promotion and disease prevention 10/14/2015   Gastric ulcer- due to Mobic 10/09/2015   Age-related nuclear cataract of both eyes 01/25/2015   Posterior vitreous detachment of left eye 01/25/2015   chronic Palpitations 07/19/2013    Past Medical History:  Diagnosis Date   Arthritis    Chest pain 06/2013   Left sided   Chronic leg pain 06/2013   Complication of anesthesia    Dysrhythmia    Fatigue    GERD (gastroesophageal reflux disease)    History of kidney stones    Hypertension    Hypothyroidism    Nonproductive cough 06/2013   Overweight 01/18/2014   Peripheral neuropathy    PONV (postoperative nausea and vomiting)    Sleep difficulties    Change in sleep patterns   SOB (shortness of breath) 06/2013   Tachycardia 06/2013   Thyroid disease    hypo   UTI (lower urinary tract infection)    Vesico-ureteric reflux     Family History  Problem Relation Age of Onset   Migraines Other        Fam Hx    Heart disease Other        Fam Hx   Thyroid disease Other        Fam Hx - She also has thyroid disease   Diabetes Other        Fam Hx   Arthritis Other        Fam Hx   Gallbladder disease Other        Fam Hx   Cancer Other        Fam Hx - Pancreatic   Hypertension Other        Fam Hx   Heart attack Cousin        Multiple cousins with MI's before 69 years of age   Heart disease Mother 47   Hyperlipidemia Mother 42   Hypertension Mother 41   Stroke Mother 77   Heart disease Maternal Grandmother    Cancer  Father 67       pancreatic   Diabetes Brother 50   Heart attack Maternal Uncle    Heart attack Paternal Aunt    Past Surgical History:  Procedure Laterality Date   ABDOMINAL SURGERY  1994   CYSTOSCOPY/URETEROSCOPY/HOLMIUM LASER/STENT PLACEMENT Left 09/22/2017   Procedure: CYSTOSCOPY/RETROGRADE/URETEROSCOPY/ BASKET STONE EXTRACTION/STENT PLACEMENT;  Surgeon: Rene Paci, MD;  Location: [facility];  Service: Urology;  Laterality: Left;  ONLY NEEDS 60 MIN   FOOT SURGERY Right 2009   KIDNEY SURGERY Left 1993   KNEE ARTHROPLASTY Right 11/02/2019   KNEE SURGERY     Multiple knee surgeries   OOPHORECTOMY Left 1994   REPLACEMENT TOTAL KNEE Left 10/13/2018   Dr. Thurston Hole   small fiber neuropathy biopsy  12/2017   TONSILLECTOMY     URETEROSCOPY VIA URETEROSTOMY Left 1994   Social History   Social History Narrative   Not on file  Immunization History  Administered Date(s) Administered   Tdap 01/07/2011     Objective: Vital Signs: BP (!) 143/80 (BP Location: Left Arm, Patient Position: Sitting, Cuff Size: Normal)    Pulse (!) 103    Ht 5\' 6"  (1.676 m)    Wt 196 lb (88.9 kg)    BMI 31.64 kg/m    Physical Exam Vitals and nursing note reviewed.  Constitutional:      Appearance: She is well-developed and well-nourished.  HENT:     Head: Normocephalic and atraumatic.  Eyes:     Extraocular Movements: EOM normal.     Conjunctiva/sclera: Conjunctivae normal.  Cardiovascular:     Pulses: Intact distal pulses.  Pulmonary:     Effort: Pulmonary effort is normal.  Abdominal:     Palpations: Abdomen is soft.  Musculoskeletal:     Cervical back: Normal range of motion.  Skin:    General: Skin is warm and dry.     Capillary Refill: Capillary refill takes less than 2 seconds.  Neurological:     Mental Status: She is alert and oriented to person, place, and time.  Psychiatric:        Mood and Affect: Mood and affect normal.        Behavior: Behavior normal.       Musculoskeletal Exam: C-spine, thoracic spine, and lumbar spine good ROM.  No midline spinal tenderness. Some fullness around the right sternoclavicular joint noted.  No tenderness or synovitis of SCJ noted.  Shoulder joints, elbow joints, wrist joints, MCPs, PIPs, and DIPs good ROM with no synovitis.  Complete fist formation bilaterally.  PIP and DIP thickening consistent with osteoarthritis of both hands.  Hip joints have slightly limited ROM bilaterally.  Left knee replacement has good ROM with no warmth or effusion.  Right knee replacement has slightly limited extension and warmth. Ankle joints good ROM with no tenderness or inflammation.   CDAI Exam: CDAI Score: -- Patient Global: --; Provider Global: -- Swollen: --; Tender: -- Joint Exam 12/15/2019   No joint exam has been documented for this visit   There is currently no information documented on the homunculus. Go to the Rheumatology activity and complete the homunculus joint exam.  Investigation: No additional findings.  Imaging: No results found.  Recent Labs: Lab Results  Component Value Date   WBC 9.3 10/13/2019   HGB 15.1 (H) 10/13/2019   PLT 239 10/13/2019   NA 136 10/13/2019   K 3.7 10/13/2019   CL 101 10/13/2019   CO2 25 10/13/2019   GLUCOSE 100 (H) 10/13/2019   BUN 19 10/13/2019   CREATININE 0.45 10/13/2019   BILITOT 0.3 08/17/2019   ALKPHOS 92 08/17/2019   AST 27 08/17/2019   ALT 25 08/17/2019   PROT 6.9 08/17/2019   ALBUMIN 4.7 08/17/2019   CALCIUM 9.6 10/13/2019   GFRAA 108 08/17/2019   QFTBGOLDPLUS NEGATIVE 02/16/2017    Speciality Comments: PLQ eye exam: 04/20/2019 normal. Dr. 04/22/2019. Follow up in 1 year.  Procedures:  No procedures performed Allergies: Penicillins and Sulfa antibiotics   Assessment / Plan:     Visit Diagnoses: Inflammatory arthritis: She has no joint tenderness or synovitis on examination today.  She recently underwent a right knee total arthroplasty performed by Dr.  Burgess Estelle about 6 weeks ago.  About 10 days prior to surgery she discontinued all of her medications including Plaquenil.  She was previously taking Plaquenil 200 mg 1 tablet by mouth daily and was tolerating it without any side  effects.  She had a flare in multiple joints while off of Plaquenil and mitigare prior to surgery.  Postoperatively she has been on a prednisone taper and has not had any increased joint pain or joint swelling.  We discussed the importance of restarting on Plaquenil 200 mg 1 tablet by mouth daily in order to slow the progression of joint damage as well as to prevent recurrent flares.  She does not need a refill at this time.  She was advised to notify us if she develops recurrent flares.  She will follow-up in the office in 5 months.  High risk medication use - Plaquenil 200 mg 1 tablet by mouth daily.  PLQ eye exam: 04/20/2019.  CBC and BMP were updated on 10/13/2019.  She has not been taking Plaquenil for the past 7 to 8 weeks.  She was advised to have repeat lab work 3 months after restarting on Plaquenil.  Standing orders for CBC and CMP were placed today.- Plan: CBC with Differential/Platelet, COMPLETE METABOLIC PANEL WITH GFR  Pseudogout involving multiple joints - She discontinued colchicine 7-8 weeks ago prior to undergoing a right knee total arthroplasty.  She had a flare in several joints several days after discontinuing colchicine.  She has been taking a prednisone taper postoperatively which has been managing her generalized arthralgias and inflammation.  She is not experiencing any signs or symptoms of a flare at this time.  We discussed restarting on colchicine to 0.6 mg 1 capsule by mouth daily.  She was given a Medicare co-pay card today while in the office.  Primary osteoarthritis of both hands: She has PIP and DIP thickening consistent with osteoarthritis of both hands.  She is able to make a complete fist bilaterally.  No synovitis was noted on examination today.  Joint  protection and muscle strengthening were discussed.  Bilateral primary osteoarthritis of hip: She has slightly limited range of motion of both hip joints on examination today.  She is not experiencing any groin pain at this time.  S/P total knee arthroplasty, right: Doing well.  Performed by Dr. Thurston Hole about 6 weeks ago.  She is currently going to physical therapy as recommended.  She has slightly limited extension on examination as well as warmth but no effusion.  She recently completed a prednisone taper.  She could not tolerate taking Celebrex or meloxicam.  She will continue to go to physical therapy and follow-up with Dr. Thurston Hole.  S/P total knee replacement, left - She had surgery by Dr. Thurston Hole in 2020.  Doing well.  She has good range of motion with no discomfort.  No warmth or effusion was noted.  Primary osteoarthritis of both feet: She is not experiencing any discomfort in her feet at this time.  She wears proper fitting shoes.  Spondylosis of lumbar region without myelopathy or radiculopathy: She has experiencing increased discomfort in her lower back since having her right knee replaced about 6 weeks ago.  She attributes the increased discomfort due to exercises at physical therapy.  She is not experiencing any symptoms of radiculopathy at this time.  Vitamin D deficiency: She is taking vitamin D 2,000 units daily.   Other medical conditions are listed as follows:   Hyperuricemia  NAFLD (nonalcoholic fatty liver disease)  Essential hypertension  Small fiber neuropathy  Primary insomnia  History of kidney stones  h/o Hiatal hernia    Orders: Orders Placed This Encounter  Procedures   CBC with Differential/Platelet   COMPLETE METABOLIC PANEL WITH GFR  No orders of the defined types were placed in this encounter.    Follow-Up Instructions: Return in about 5 months (around 05/14/2020) for CPPD, inflammatory arthritis .   Gearldine Bienenstockaylor M Akelia Husted, PA-C  Note - This record  has been created using Dragon software.  Chart creation errors have been sought, but may not always  have been located. Such creation errors do not reflect on  the standard of medical care.

## 2019-12-15 ENCOUNTER — Encounter: Payer: Self-pay | Admitting: Physician Assistant

## 2019-12-15 ENCOUNTER — Ambulatory Visit (INDEPENDENT_AMBULATORY_CARE_PROVIDER_SITE_OTHER): Payer: BC Managed Care – PPO | Admitting: Physician Assistant

## 2019-12-15 ENCOUNTER — Other Ambulatory Visit: Payer: Self-pay

## 2019-12-15 VITALS — BP 143/80 | HR 103 | Ht 66.0 in | Wt 196.0 lb

## 2019-12-15 DIAGNOSIS — K760 Fatty (change of) liver, not elsewhere classified: Secondary | ICD-10-CM

## 2019-12-15 DIAGNOSIS — M1990 Unspecified osteoarthritis, unspecified site: Secondary | ICD-10-CM

## 2019-12-15 DIAGNOSIS — K449 Diaphragmatic hernia without obstruction or gangrene: Secondary | ICD-10-CM

## 2019-12-15 DIAGNOSIS — M19041 Primary osteoarthritis, right hand: Secondary | ICD-10-CM | POA: Diagnosis not present

## 2019-12-15 DIAGNOSIS — Z87442 Personal history of urinary calculi: Secondary | ICD-10-CM

## 2019-12-15 DIAGNOSIS — Z96651 Presence of right artificial knee joint: Secondary | ICD-10-CM

## 2019-12-15 DIAGNOSIS — G629 Polyneuropathy, unspecified: Secondary | ICD-10-CM

## 2019-12-15 DIAGNOSIS — Z96652 Presence of left artificial knee joint: Secondary | ICD-10-CM

## 2019-12-15 DIAGNOSIS — E559 Vitamin D deficiency, unspecified: Secondary | ICD-10-CM

## 2019-12-15 DIAGNOSIS — I10 Essential (primary) hypertension: Secondary | ICD-10-CM

## 2019-12-15 DIAGNOSIS — M19071 Primary osteoarthritis, right ankle and foot: Secondary | ICD-10-CM

## 2019-12-15 DIAGNOSIS — M1189 Other specified crystal arthropathies, multiple sites: Secondary | ICD-10-CM | POA: Diagnosis not present

## 2019-12-15 DIAGNOSIS — M1711 Unilateral primary osteoarthritis, right knee: Secondary | ICD-10-CM

## 2019-12-15 DIAGNOSIS — F5101 Primary insomnia: Secondary | ICD-10-CM

## 2019-12-15 DIAGNOSIS — M19072 Primary osteoarthritis, left ankle and foot: Secondary | ICD-10-CM

## 2019-12-15 DIAGNOSIS — M47816 Spondylosis without myelopathy or radiculopathy, lumbar region: Secondary | ICD-10-CM

## 2019-12-15 DIAGNOSIS — Z79899 Other long term (current) drug therapy: Secondary | ICD-10-CM | POA: Diagnosis not present

## 2019-12-15 DIAGNOSIS — E790 Hyperuricemia without signs of inflammatory arthritis and tophaceous disease: Secondary | ICD-10-CM

## 2019-12-15 DIAGNOSIS — M160 Bilateral primary osteoarthritis of hip: Secondary | ICD-10-CM

## 2019-12-15 DIAGNOSIS — M19042 Primary osteoarthritis, left hand: Secondary | ICD-10-CM

## 2019-12-15 NOTE — Patient Instructions (Signed)
Standing Labs We placed an order today for your standing lab work.   Please have your standing labs drawn in 3 months   If possible, please have your labs drawn 2 weeks prior to your appointment so that the provider can discuss your results at your appointment.  We have open lab daily Monday through Thursday from 8:30-12:30 PM and 1:30-4:30 PM and Friday from 8:30-12:30 PM and 1:30-4:00 PM at the office of Dr. Shaili Deveshwar, [facility].   Please be advised, patients with office appointments requiring lab work will take precedents over walk-in lab work.  If possible, please come for your lab work on Monday and Friday afternoons, as you may experience shorter wait times. The office is located at [address], [address], [place], [place] [address] No appointment is necessary.   Labs are drawn by [facility]. Please bring your co-pay at the time of your lab draw.  You may receive a bill from [facility] for your lab work.  If you wish to have your labs drawn at another location, please call the office 24 hours in advance to send orders.  If you have any questions regarding directions or hours of operation,  please call [phone].   As a reminder, please drink plenty of water prior to coming for your lab work. Thanks!   

## 2019-12-19 ENCOUNTER — Ambulatory Visit: Payer: BC Managed Care – PPO | Admitting: Physician Assistant

## 2019-12-20 ENCOUNTER — Encounter: Payer: Self-pay | Admitting: Physician Assistant

## 2019-12-20 ENCOUNTER — Other Ambulatory Visit: Payer: Self-pay

## 2019-12-20 ENCOUNTER — Ambulatory Visit (INDEPENDENT_AMBULATORY_CARE_PROVIDER_SITE_OTHER): Payer: BC Managed Care – PPO | Admitting: Physician Assistant

## 2019-12-20 VITALS — BP 143/80 | HR 77 | Ht 66.0 in | Wt 197.0 lb

## 2019-12-20 DIAGNOSIS — I10 Essential (primary) hypertension: Secondary | ICD-10-CM | POA: Diagnosis not present

## 2019-12-20 DIAGNOSIS — F4323 Adjustment disorder with mixed anxiety and depressed mood: Secondary | ICD-10-CM

## 2019-12-20 DIAGNOSIS — G479 Sleep disorder, unspecified: Secondary | ICD-10-CM

## 2019-12-20 MED ORDER — HYDROXYZINE HCL 25 MG PO TABS: ORAL_TABLET | ORAL | 0 refills | Status: DC

## 2019-12-20 MED ORDER — ESCITALOPRAM OXALATE 10 MG PO TABS: 10.0000 mg | ORAL_TABLET | Freq: Every day | ORAL | 0 refills | Status: DC

## 2019-12-20 NOTE — Patient Instructions (Signed)

## 2019-12-20 NOTE — Progress Notes (Signed)
Established Patient Office Visit  Subjective:  Patient ID: Marissa Larson, female    DOB: 1955/08/06  Age: 64 y.o. MRN: [id number]  CC:  Chief Complaint  Patient presents with  . Anxiety    HPI Marissa Larson presents to discuss anxiety. Reports hydroxyzine helps with anxiety and sleep. She usually takes it at night, it helps with claming her thoughts/brain. Was experiencing significant stress before her right knee surgery which was contributing to her anxiety and blood pressure, which have improved and are better. Taking Lexapro 10 mg without issues. Denies SI/HI.  HTN: Pt denies chest pain, palpitations, dizziness or lower extremity swelling. Is no longer taking blood pressure medication because it has improved and has been checking it at home. Readings have been in 130-135/80s.   Past Medical History:  Diagnosis Date  . Arthritis   . Chest pain 06/2013   Left sided  . Chronic leg pain 06/2013  . Complication of anesthesia   . Dysrhythmia   . Fatigue   . GERD (gastroesophageal reflux disease)   . History of kidney stones   . Hypertension   . Hypothyroidism   . Nonproductive cough 06/2013  . Overweight 01/18/2014  . Peripheral neuropathy   . PONV (postoperative nausea and vomiting)   . Sleep difficulties    Change in sleep patterns  . SOB (shortness of breath) 06/2013  . Tachycardia 06/2013  . Thyroid disease    hypo  . UTI (lower urinary tract infection)   . Vesico-ureteric reflux     Past Surgical History:  Procedure Laterality Date  . ABDOMINAL SURGERY  1994  . CYSTOSCOPY/URETEROSCOPY/HOLMIUM LASER/STENT PLACEMENT Left 09/22/2017   Procedure: CYSTOSCOPY/RETROGRADE/URETEROSCOPY/ BASKET STONE EXTRACTION/STENT PLACEMENT;  Surgeon: Rene Paci, MD;  Location: [facility];  Service: Urology;  Laterality: Left;  ONLY NEEDS 60 MIN  . FOOT SURGERY Right 2009  . KIDNEY SURGERY Left 1993  . KNEE ARTHROPLASTY Right 11/02/2019  . KNEE SURGERY     Multiple knee  surgeries  . OOPHORECTOMY Left 1994  . 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 Thurston Hole  . small fiber neuropathy biopsy  12/2017  . TONSILLECTOMY    . URETEROSCOPY VIA URETEROSTOMY Left 1994    Family History  Problem Relation Age of Onset  . Migraines Other        Fam Hx   . Heart disease Other        Fam Hx  . Thyroid disease Other        Fam Hx - She also has thyroid disease  . Diabetes Other        Fam Hx  . Arthritis Other        Fam Hx  . Gallbladder disease Other        Fam Hx  . Cancer Other        Fam Hx - Pancreatic  . Hypertension Other        Fam Hx  . Heart attack Cousin        Multiple cousins with MI's before 69 years of age  . Heart disease Mother 2  . Hyperlipidemia Mother 31  . Hypertension Mother 61  . Stroke Mother 11  . Heart disease Maternal Grandmother   . Cancer Father 63       pancreatic  . Diabetes Brother 50  . Heart attack Maternal Uncle   . Heart attack Paternal Aunt     Social History   Socioeconomic History  . Marital status: Married  Spouse name: Not on file  . Number of children: 0  . Years of education: Not on file  . Highest education level: Not on file  Occupational History  . Occupation: Orthoptist  Tobacco Use  . Smoking status: Never Smoker  . Smokeless tobacco: Never Used  Vaping Use  . Vaping Use: Never used  Substance and Sexual Activity  . Alcohol use: Yes    Alcohol/week: 1.0 - 2.0 standard drink    Types: 1 - 2 Glasses of wine per week    Comment: Twice a week  . Drug use: No  . Sexual activity: Yes    Birth control/protection: None  Other Topics Concern  . Not on file  Social History Narrative  . Not on file   Social Determinants of Health   Financial Resource Strain: Not on file  Food Insecurity: Not on file  Transportation Needs: Not on file  Physical Activity: Not on file  Stress: Not on file  Social Connections: Not on file  Intimate Partner Violence: Not on file     Outpatient Medications Prior to Visit  Medication Sig Dispense Refill  . Cholecalciferol (VITAMIN D) 50 MCG (2000 UT) CAPS 2,000 daily OTC in addition to once weekly prescription 30 capsule   . CREAM BASE EX Apply 1 application topically daily. BIEST Transdermal Cream 0.5 mg: Compounded bio-identical Estriol (E3) combined by [facility]    . eszopiclone (LUNESTA) 1 MG TABS tablet Take 1 mg by mouth at bedtime as needed.    . famotidine (PEPCID) 40 MG tablet Take 40 mg by mouth daily as needed (for heartburn/indigestion.).   2  . Flaxseed, Linseed, (FLAX SEED OIL) 1000 MG CAPS Take 2,000 mg by mouth every evening.    . gabapentin (NEURONTIN) 300 MG capsule Take 300 mg by mouth at bedtime as needed.    . hydroxychloroquine (PLAQUENIL) 200 MG tablet TAKE 1 TABLET BY MOUTH TWICE A DAY MONDAY THROUGH FRIDAY 120 tablet 0  . Magnesium 500 MG CAPS Take 1,000 mg by mouth every evening.    Marland Kitchen MITIGARE 0.6 MG CAPS TAKE 1 CAPSULE BY MOUTH DAILY. 90 capsule 0  . nitrofurantoin, macrocrystal-monohydrate, (MACROBID) 100 MG capsule Take by mouth daily as needed. UTI prevention    . NONFORMULARY OR COMPOUNDED ITEM Take 150 mg by mouth at bedtime. Bio identical Progesterone 150 ([facility])    . Omega-3 Fatty Acids (FISH OIL) 1200 MG CAPS Take 2,400 mg by mouth every evening.    Marland Kitchen SYNTHROID 112 MCG tablet Take 1 tablet (112 mcg total) by mouth daily before breakfast. 90 tablet 3  . Vitamin D, Ergocalciferol, (DRISDOL) 1.25 MG (50000 UNIT) CAPS capsule Take 1 capsule (50,000 Units total) by mouth every 7 (seven) days. 12 capsule 1  . escitalopram (LEXAPRO) 10 MG tablet Take 1 tablet (10 mg total) by mouth at bedtime. 90 tablet 1  . hydrOXYzine (ATARAX/VISTARIL) 25 MG tablet TAKE 1 TO 2 TABLETS BY MOUTH EVERY 6 TO 8 HOURS AS NEEDED FOR ANXIETY 30 tablet 0   No facility-administered medications prior to visit.    Allergies  Allergen Reactions  . Penicillins Hives and Itching    Has  patient had a PCN reaction causing immediate rash, facial/tongue/throat swelling, SOB or lightheadedness with hypotension: No Has patient had a PCN reaction causing severe rash involving mucus membranes or skin necrosis: No Has patient had a PCN reaction that required hospitalization: No Has patient had a PCN reaction occurring within  the last 10 years: No If all of the above answers are "NO", then may proceed with Cephalosporin use.   . Sulfa Antibiotics Hives and Itching    Bactrim    ROS Review of Systems A fourteen system review of systems was performed and found to be positive as per HPI.   Objective:    Physical Exam General:  Well Developed, well nourished, appropriate for stated age.  Neuro:  Alert and oriented,  extra-ocular muscles intact, no focal deficits  HEENT:  Normocephalic, atraumatic, neck supple Skin:  no gross rash, warm, pink. Cardiac:  RRR, S1 S2, w/o murmur Respiratory:  ECTA B/L, Not using accessory muscles, speaking in full sentences- unlabored. Vascular:  Ext warm, no cyanosis apprec.; cap RF less 2 sec. Psych:  No HI/SI, judgement and insight good, Euthymic mood. Full Affect.  BP (!) 143/80   Pulse 77   Ht 5\' 6"  (1.676 m)   Wt 197 lb (89.4 kg)   SpO2 97%   BMI 31.80 kg/m  Wt Readings from Last 3 Encounters:  12/20/19 197 lb (89.4 kg)  12/15/19 196 lb (88.9 kg)  10/26/19 198 lb (89.8 kg)     Health Maintenance Due  Topic Date Due  . COVID-19 Vaccine (1) Never done  . PAP SMEAR-Modifier  10/23/2019  . MAMMOGRAM  11/24/2019    There are no preventive care reminders to display for this patient.  Lab Results  Component Value Date   TSH 1.300 08/17/2019   Lab Results  Component Value Date   WBC 9.3 10/13/2019   HGB 15.1 (H) 10/13/2019   HCT 44.1 10/13/2019   MCV 95.2 10/13/2019   PLT 239 10/13/2019   Lab Results  Component Value Date   NA 136 10/13/2019   K 3.7 10/13/2019   CO2 25 10/13/2019   GLUCOSE 100 (H) 10/13/2019   BUN 19  10/13/2019   CREATININE 0.45 10/13/2019   BILITOT 0.3 08/17/2019   ALKPHOS 92 08/17/2019   AST 27 08/17/2019   ALT 25 08/17/2019   PROT 6.9 08/17/2019   ALBUMIN 4.7 08/17/2019   CALCIUM 9.6 10/13/2019   ANIONGAP 10 10/13/2019   Lab Results  Component Value Date   CHOL 236 (H) 08/17/2019   Lab Results  Component Value Date   HDL 93 08/17/2019   Lab Results  Component Value Date   LDLCALC 120 (H) 08/17/2019   Lab Results  Component Value Date   TRIG 138 08/17/2019   Lab Results  Component Value Date   CHOLHDL 2.5 08/17/2019   Lab Results  Component Value Date   HGBA1C 5.5 08/17/2019      Assessment & Plan:   Problem List Items Addressed This Visit      Other   Sleep difficulties (Chronic)   Relevant Medications   escitalopram (LEXAPRO) 10 MG tablet   Adjustment disorder with mixed anxiety and depressed mood (Chronic)   Relevant Medications   escitalopram (LEXAPRO) 10 MG tablet     Adjustment disorder with mixed anxiety and depressed mood, Sleep difficulties: -PHQ 2 score 0, PHQ-9 score of 4 -Continue Lexapro 10 mg, hydroxyzine as needed for anxiety.  Patient also takes Lunesta and recommend to closely monitor therapy with hydroxyzine. -Will continue to monitor.  Hypertension: -BP initially elevated, BP recheck mildly improved (likely white coat syndrome component).  Advised to continue ambulatory BP/pulse monitoring and keep a log.  If BP consistently above 140/90 recommend to resume antihypertensive regimen. -Recommend to stay well-hydrated and follow a low-sodium  diet.   Meds ordered this encounter  Medications  . hydrOXYzine (ATARAX/VISTARIL) 25 MG tablet    Sig: Take 1-2 tabs by mouth every 6-8 hrs PRN anxiety    Dispense:  90 tablet    Refill:  0  . escitalopram (LEXAPRO) 10 MG tablet    Sig: Take 1 tablet (10 mg total) by mouth at bedtime.    Dispense:  90 tablet    Refill:  0    Needs ov with new provider at office for RF    Follow-up:  Return in about 5 months (around 05/19/2020) for Mood, HTN.    Mayer MaskerMaritza Jenisa Monty, PA-C

## 2019-12-26 ENCOUNTER — Other Ambulatory Visit: Payer: Self-pay | Admitting: Physician Assistant

## 2019-12-26 ENCOUNTER — Ambulatory Visit: Payer: BC Managed Care – PPO | Admitting: Internal Medicine

## 2019-12-26 DIAGNOSIS — M1990 Unspecified osteoarthritis, unspecified site: Secondary | ICD-10-CM

## 2019-12-26 NOTE — Telephone Encounter (Signed)
Please schedule patient for a follow up visit. Patient is due May 2022. Thanks!

## 2019-12-26 NOTE — Telephone Encounter (Signed)
Last Visit: 12/15/2019 Next Visit: due May 2022. Message sent to the front to schedule patient  Labs: 10/13/2019 Hgb 15.1. Glucose 100  Eye exam: 04/20/2019 normal.  Current Dose per office note 12/15/2019: Plaquenil 200 mg 1 tablet by mouth daily DX:  Inflammatory arthritis  Okay to refill Plaquenil?

## 2019-12-27 ENCOUNTER — Ambulatory Visit: Payer: BC Managed Care – PPO | Admitting: Physician Assistant

## 2020-01-12 ENCOUNTER — Other Ambulatory Visit: Payer: Self-pay | Admitting: Physician Assistant

## 2020-01-12 DIAGNOSIS — I10 Essential (primary) hypertension: Secondary | ICD-10-CM

## 2020-01-30 ENCOUNTER — Telehealth: Payer: Self-pay | Admitting: Physician Assistant

## 2020-01-30 NOTE — Telephone Encounter (Signed)
Patient schedule for acute visit tomorrow morning. AS, CMA

## 2020-01-30 NOTE — Telephone Encounter (Signed)
Patient has a head cold and would like to know how to treat it and if anything could be called in to help. If needed, patient uses [facility], thanks.

## 2020-01-31 ENCOUNTER — Encounter: Payer: Self-pay | Admitting: Physician Assistant

## 2020-01-31 ENCOUNTER — Ambulatory Visit (INDEPENDENT_AMBULATORY_CARE_PROVIDER_SITE_OTHER): Payer: BC Managed Care – PPO | Admitting: Physician Assistant

## 2020-01-31 VITALS — BP 127/89 | HR 86 | Ht 66.0 in | Wt 189.0 lb

## 2020-01-31 DIAGNOSIS — J069 Acute upper respiratory infection, unspecified: Secondary | ICD-10-CM | POA: Diagnosis not present

## 2020-01-31 DIAGNOSIS — R059 Cough, unspecified: Secondary | ICD-10-CM

## 2020-01-31 MED ORDER — AZITHROMYCIN 250 MG PO TABS: ORAL_TABLET | ORAL | 0 refills | Status: DC

## 2020-01-31 MED ORDER — BENZONATATE 100 MG PO CAPS: 100.0000 mg | ORAL_CAPSULE | Freq: Three times a day (TID) | ORAL | 0 refills | Status: DC | PRN

## 2020-01-31 NOTE — Progress Notes (Signed)
Telehealth office visit note for Marissa Masker, PA-C- at Primary Care at [facility]   I connected with current patient today by telephone and verified that I am speaking with the correct person   . Location of the patient: Home . Location of the provider: Office - This visit type was conducted due to national recommendations for restrictions regarding the COVID-19 Pandemic (e.g. social distancing) in an effort to limit this patient's exposure and mitigate transmission in our community.    - No physical exam could be performed with this format, beyond that communicated to [place] by the patient/ family members as noted.   - Additionally my office staff/ schedulers were to discuss with the patient that there may be a monetary charge related to this service, depending on their medical insurance.  My understanding is that patient understood and consented to proceed.     _________________________________________________________________________________   History of Present Illness: Patient calls in with c/o head cold, headache, congestion, dry cough, malaise and fatigue for 2 weeks. Reports symptoms started as a runny nose and progressed to feeling congested. Has a sore throat which is better. Denies fever, shortness of breath, chest pain, chills or sick contact exposures. Has not been vaccinated against Covid or influenza. Has not been tested for COVID-19 and will not be tested unless it is a saliva test. Has been taking DayQuil NyQuil, elderberry and Advil cold and sinus with mild relief.     GAD 7 : Generalized Anxiety Score 12/20/2019 05/25/2019 07/19/2018 05/29/2016  Nervous, Anxious, on Edge 0 0 0 0  Control/stop worrying 0 0 0 1  Worry too much - different things 0 0 0 0  Trouble relaxing 0 0 0 0  Restless 0 0 0 0  Easily annoyed or irritable 0 0 0 0  Afraid - awful might happen 0 0 0 0  Total GAD 7 Score 0 0 0 1  Anxiety Difficulty - - Not difficult at all Somewhat difficult     Depression screen [facility] 2/9 01/31/2020 12/20/2019 10/03/2019 07/27/2019 05/25/2019  Decreased Interest 0 0 0 0 0  Down, Depressed, Hopeless 0 0 0 0 0  PHQ - 2 Score 0 0 0 0 0  Altered sleeping 3 2 3 3 3   Tired, decreased energy 2 2 0 3 3  Change in appetite 1 0 0 0 2  Feeling bad or failure about yourself  0 0 0 0 0  Trouble concentrating 0 0 0 0 0  Moving slowly or fidgety/restless 0 0 0 0 0  Suicidal thoughts 0 0 0 0 0  PHQ-9 Score 6 4 3 6 8   Difficult doing work/chores Not difficult at all - Not difficult at all Not difficult at all Somewhat difficult  Some recent data might be hidden      Impression and Recommendations:     1. Upper respiratory tract infection, unspecified type   2. Cough     Upper respiratory tract infection, unspecified type, Cough: -Symptoms have been ongoing for >10 days with minimal improvement so will start antibiotic therapy. Discussed with patient recommend COVID-19 testing if symptoms fail to improve or worsen despite starting antibiotic therapy. Discussed with patient antibiotics treat bacterial infections and if current illness is viral in etiology, antibiotic will provide minimal benefit. Patient verbalized understanding. -Will send prescription for Tessalon Perles to help with cough. Continue home supportive care. -Monitor symptoms especially for red flag s/sxs such as shortness of breath, chest pain, or  confusion recommend to seek immediate medical care. -Recommend to continue with latest Covid precautions such as wearing mask.   - As part of my medical decision making, I reviewed the following data within the electronic MEDICAL RECORD NUMBER History obtained from pt /family, CMA notes reviewed and incorporated if applicable, Labs reviewed, Radiograph/ tests reviewed if applicable and OV notes from prior OV's with me, as well as any other specialists she/he has seen since seeing me last, were all reviewed and used in my medical decision making process  today.    - Additionally, when appropriate, discussion had with patient regarding our treatment plan, and their biases/concerns about that plan were used in my medical decision making today.    - The patient agreed with the plan and demonstrated an understanding of the instructions.   No barriers to understanding were identified.     - The patient was advised to call back or seek an in-person evaluation if the symptoms worsen or if the condition fails to improve as anticipated.   Return if symptoms worsen or fail to improve.    No orders of the defined types were placed in this encounter.   Meds ordered this encounter  Medications  . azithromycin (ZITHROMAX) 250 MG tablet    Sig: Take 2 tablets by mouth on day 1. Take 1 tablet by mouth x 4 days.    Dispense:  6 tablet    Refill:  0    Order Specific Question:   Supervising Provider    Answer:   Nani GasserMETHENEY, CATHERINE D [[id number]]  . benzonatate (TESSALON) 100 MG capsule    Sig: Take 1 capsule (100 mg total) by mouth 3 (three) times daily as needed for cough.    Dispense:  30 capsule    Refill:  0    Order Specific Question:   Supervising Provider    Answer:   Nani GasserMETHENEY, CATHERINE D [[id number]]    There are no discontinued medications.     Time spent on visit including pre-visit chart review and post-visit care was 12 minutes.      The 21st Century Cures Act was signed into law in 2016 which includes the topic of electronic health records.  This provides immediate access to information in MyChart.  This includes consultation notes, operative notes, office notes, lab results and pathology reports.  If you have any questions about what you read please let us know at your next visit or call us at the office.  We are right here with you.  Note:  This note was prepared with assistance of Dragon voice recognition software. Occasional wrong-word or sound-a-like substitutions may have occurred due to the inherent limitations of voice recognition  software.  __________________________________________________________________________________     Patient Care Team    Relationship Specialty Notifications Start End  Marissa MaskerAbonza, Jasiel [place], [place] PCP - General   05/08/19   Marinus Mawaylor, Gregg W, MD Consulting Physician Cardiology  10/09/15   Salvatore MarvelWainer, Robert, MD Consulting Physician Orthopedic Surgery  10/09/15   Pollyann Savoyeveshwar, Shaili, MD Consulting Physician Rheumatology  10/09/15   Sharrell KuMedoff, Jeffrey, MD Consulting Physician Gastroenterology  10/09/15   Freddy FinnerNeal, W Ronald, MD Consulting Physician Obstetrics and Gynecology  03/13/17   Rene PaciWinter, Christopher Aaron, MD Consulting Physician Urology  03/13/17   Anson FretAhern, Antonia B, MD Consulting Physician Neurology  04/22/18      -Vitals obtained; medications/ allergies reconciled;  personal medical, social, Sx etc.histories were updated by CMA, reviewed by me and are reflected in chart   Patient Active  Problem List   Diagnosis Date Noted  . Elevated LDL cholesterol level 07/19/2018  . Prediabetes 07/19/2018  . Paresthesia 12/09/2017  . Chronic insomnia 11/24/2017  . Insomnia secondary to chronic pain 11/24/2017  . Complaint related to dreams 11/24/2017  . 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 Neuropathy 08/18/2017  . 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 Obesity, Class I, BMI 30-34.9 12/03/2016  . Kidney stones 09/12/2016  . Nausea 09/12/2016  . High risk medication use 07/30/2016  . NAFLD (nonalcoholic fatty liver disease) 62/13/0865  . Primary osteoarthritis of both feet 04/12/2016  . History of kidney stones 04/12/2016  . Primary osteoarthritis of both hands 04/11/2016  . Bilateral primary osteoarthritis of hip 04/11/2016  . Primary osteoarthritis of both knees 04/11/2016  . Spondylosis of lumbar region without myelopathy or radiculopathy 04/11/2016  . Hyperuricemia 04/11/2016  . Primary insomnia 04/11/2016  .  Flank pain 04/02/2016  . Sinusitis 04/02/2016  . Elevated HDL 11/11/2015  . Elevated LFTs 11/11/2015  . Adjustment disorder with mixed anxiety and depressed mood 11/11/2015  . Dysuria 11/11/2015  . Hypothyroidism 10/14/2015  . Pseudogout involving multiple joints 10/14/2015  . Vitamin D deficiency 10/14/2015  . Hormone replacement therapy- per GYN 10/14/2015  . HTN (hypertension) 10/14/2015  . Fatigue 10/14/2015  . h/o Hiatal hernia 10/14/2015  . Sleep difficulties 10/14/2015  . Generalized OA 10/14/2015  . Counseling on health promotion and disease prevention 10/14/2015  . 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 Age-related nuclear cataract of both eyes 01/25/2015  . Posterior vitreous detachment of left eye 01/25/2015  . chronic Palpitations 07/19/2013     Current Meds  Medication Sig  . azithromycin (ZITHROMAX) 250 MG tablet Take 2 tablets by mouth on day 1. Take 1 tablet by mouth x 4 days.  . benzonatate (TESSALON) 100 MG capsule Take 1 capsule (100 mg total) by mouth 3 (three) times daily as needed for cough.  . Cholecalciferol (VITAMIN D) 50 MCG (2000 UT) CAPS 2,000 daily OTC in addition to once weekly prescription  . CREAM BASE EX Apply 1 application topically daily. BIEST Transdermal Cream 0.5 mg: Compounded bio-identical Estriol (E3) combined by [facility]  . escitalopram (LEXAPRO) 10 MG tablet Take 1 tablet (10 mg total) by mouth at bedtime.  . eszopiclone (LUNESTA) 1 MG TABS tablet Take 1 mg by mouth at bedtime as needed.  . famotidine (PEPCID) 40 MG tablet Take 40 mg by mouth daily as needed (for heartburn/indigestion.).   Marland Kitchen Flaxseed, Linseed, (FLAX SEED OIL) 1000 MG CAPS Take 2,000 mg by mouth every evening.  . gabapentin (NEURONTIN) 300 MG capsule Take 300 mg by mouth at bedtime as needed.  . hydrOXYzine (ATARAX/VISTARIL) 25 MG tablet Take 1-2 tabs by mouth every 6-8 hrs PRN anxiety  . Magnesium 500 MG CAPS Take 1,000 mg by mouth every evening.  .  nitrofurantoin, macrocrystal-monohydrate, (MACROBID) 100 MG capsule Take by mouth daily as needed. UTI prevention  . NONFORMULARY OR COMPOUNDED ITEM Take 150 mg by mouth at bedtime. Bio identical Progesterone 150 ([facility])  . Omega-3 Fatty Acids (FISH OIL) 1200 MG CAPS Take 2,400 mg by mouth every evening.  Marland Kitchen SYNTHROID 112 MCG tablet Take 1 tablet (112 mcg total) by mouth daily before breakfast.  . Vitamin D, Ergocalciferol, (DRISDOL) 1.25 MG (50000 UNIT) CAPS capsule Take 1 capsule (50,000 Units total) by mouth every 7 (  seven) days.     Allergies:  Allergies  Allergen Reactions  . Penicillins Hives and Itching    Has patient had a PCN reaction causing immediate rash, facial/tongue/throat swelling, SOB or lightheadedness with hypotension: No Has patient had a PCN reaction causing severe rash involving mucus membranes or skin necrosis: No Has patient had a PCN reaction that required hospitalization: No Has patient had a PCN reaction occurring within the last 10 years: No If all of the above answers are "NO", then may proceed with Cephalosporin use.   . Sulfa Antibiotics Hives and Itching    Bactrim     ROS:  See above HPI for pertinent positives and negatives   Objective:   Blood pressure 127/89, pulse 86, height 5\' 6"  (1.676 m), weight 189 lb (85.7 kg).  (if some vitals are omitted, this means that patient was UNABLE to obtain them. ) General: A & O * 3; sounds in no acute distress, sounds congested Respiratory: speaking in full sentences, no conversational dyspnea Psych: insight appears good, mood- appears full

## 2020-02-01 DIAGNOSIS — M25461 Effusion, right knee: Secondary | ICD-10-CM | POA: Diagnosis not present

## 2020-02-01 DIAGNOSIS — M6281 Muscle weakness (generalized): Secondary | ICD-10-CM | POA: Diagnosis not present

## 2020-02-01 DIAGNOSIS — R262 Difficulty in walking, not elsewhere classified: Secondary | ICD-10-CM | POA: Diagnosis not present

## 2020-02-01 DIAGNOSIS — M25661 Stiffness of right knee, not elsewhere classified: Secondary | ICD-10-CM | POA: Diagnosis not present

## 2020-02-10 ENCOUNTER — Ambulatory Visit: Payer: BC Managed Care – PPO | Admitting: Internal Medicine

## 2020-02-21 ENCOUNTER — Other Ambulatory Visit: Payer: Self-pay | Admitting: Physician Assistant

## 2020-02-21 DIAGNOSIS — M1990 Unspecified osteoarthritis, unspecified site: Secondary | ICD-10-CM

## 2020-02-22 ENCOUNTER — Other Ambulatory Visit: Payer: Self-pay | Admitting: Physician Assistant

## 2020-02-22 DIAGNOSIS — I10 Essential (primary) hypertension: Secondary | ICD-10-CM

## 2020-03-09 ENCOUNTER — Ambulatory Visit (INDEPENDENT_AMBULATORY_CARE_PROVIDER_SITE_OTHER): Payer: BC Managed Care – PPO | Admitting: Internal Medicine

## 2020-03-09 ENCOUNTER — Other Ambulatory Visit: Payer: Self-pay

## 2020-03-09 ENCOUNTER — Encounter: Payer: Self-pay | Admitting: Internal Medicine

## 2020-03-09 VITALS — BP 140/88 | HR 70 | Ht 66.0 in | Wt 198.8 lb

## 2020-03-09 DIAGNOSIS — E039 Hypothyroidism, unspecified: Secondary | ICD-10-CM

## 2020-03-09 DIAGNOSIS — R7303 Prediabetes: Secondary | ICD-10-CM | POA: Diagnosis not present

## 2020-03-09 LAB — TSH: TSH: 0.44 u[IU]/mL (ref 0.35–4.50)

## 2020-03-09 LAB — T4, FREE: Free T4: 1.1 ng/dL (ref 0.60–1.60)

## 2020-03-09 NOTE — Patient Instructions (Signed)
Please stop at the lab.  Please continue Synthroid 112 mcg daily.  Take the thyroid hormone every day, with water, at least 30 minutes before breakfast, separated by at least 4 hours from: - acid reflux medications - calcium - iron - multivitamins  Please come back for a follow-up appointment in 6 months.  

## 2020-03-09 NOTE — Progress Notes (Signed)
Patient ID: Marissa Larson, female   DOB: [birth year], 65 y.o.   MRN: [id number]   This visit occurred during the SARS-CoV-2 public health emergency.  Safety protocols were in place, including screening questions prior to the visit, additional usage of staff PPE, and extensive cleaning of exam room while observing appropriate contact time as indicated for disinfecting solutions.   HPI  Marissa Larson is a 65 y.o.-year-old female, initially referred by her PCP, Dr. Sharee Holster, returning for follow-up for hypothyroidism and prediabetes.  She previously saw Dr. Leslie Dales from 2017 2019.   Latest visit with me 10 months ago.  She had R TKR in 11/2019 >> slow recovery >> then hurt her back >> on pain medicines, steroids (finished 3 weeks ago)- still on Celebrex and Gabapentin. Still tired and has difficulty sleeping.  Pt. has been dx with hypothyroidism in her 79-21 y/o, when she presented with several symptoms including fatigue >> on Synthroid DAW (brand name for last 1-2 years) 112 mcg.  She takes Synthroid: - in am - fasting - at least 1 to 2 hours from b'fast - no calcium - no iron - no multivitamins - no PPIs, but takes Pepcid as needed-rarely - not on Biotin  Reviewed her TFTs: Lab Results  Component Value Date   TSH 1.300 08/17/2019   TSH 1.17 05/19/2019   TSH 1.700 06/17/2018   TSH 0.486 06/10/2017   TSH 0.61 02/20/2017   TSH 0.409 (L) 12/08/2016   TSH 1.10 10/10/2015   TSH 0.75 04/27/2015   FREET4 1.16 05/19/2019   FREET4 1.37 06/17/2018   FREET4 1.66 06/10/2017   FREET4 1.4 10/10/2015   FREET4 1.55 04/27/2015   T3FREE 3.1 06/17/2018   T3FREE 2.9 06/10/2017   T3FREE 3.2 10/10/2015   Per Dr. Altheimer's records: She was followed by an endocrinologist in [place] in about 1982-2012 prior to relocating here. TSH 1.680 on 09/29/13 on levothyroxine 112 mcg daily. TSH 1.450 on 03/23/14 on levothyroxine 112 mcg daily. TSH 1.450 on 07/21/14 on levothyroxine 112 mcg daily. Thyroid  peroxidase (anti-TPO) antibody negative and thyroglobulin antibody negative in 3/17. FT4 1.59 (0.82-1.77), TSH 0.338 (0.450-4.500) in 3/17 on levothyroxine 112 mcg daily. FT4 1.55 (0.82-1.77), TSH 0.753 (0.450-4.500) in 4/17 on levothyroxine 112 mcg daily. FT4 1.45 (0.82-1.77), TSH 0.846 (0.450-4.500) in 10/17 on levothyroxine 112 mcg daily. FT4 1.0 (0.6-1.4), TSH 1.492 (0.450-5.330) in 6/18 on levothyroxine 112 mcg daily. FT4 1.1 (0.6-1.4), TSH 0.605 (0.450-5.330) in 2/19 on levothyroxine 112 mcg daily. FT4 0.9 (0.6-1.4), TSH 0.486 (0.450-5.330) in 10/19 on levothyroxine 112 mcg daily.  She continues to describe manic fatigue and poor sleep, anxiety.  Pt denies: - feeling nodules in neck - hoarseness - dysphagia - choking - SOB with lying down  She has + FH of thyroid disorders in: Mother (thyroidectomy for goiter) and sister, M aunt (thyroidectomy for goiter).  No family history of thyroid cancer. No history of radiation therapy to head or neck.  No recent use of iodine supplements.  Not on biotin.  Mild prediabetes:  Reviewed HbA1c levels: Lab Results  Component Value Date   HGBA1C 5.5 08/17/2019   HGBA1C 5.5 05/19/2019   HGBA1C 5.7 (H) 06/17/2018   HGBA1C 5.7 (H) 06/10/2017   HGBA1C 5.5 12/08/2016   HGBA1C 5.5 10/10/2015   She is not on any medications for her prediabetes.  She is not checking sugars.  No CKD: Lab Results  Component Value Date   BUN 19 10/13/2019   BUN 15 08/17/2019   Lab Results  Component Value Date   CREATININE 0.45 10/13/2019   CREATININE 0.68 08/17/2019   + HL; latest lipids: Lab Results  Component Value Date   CHOL 236 (H) 08/17/2019   HDL 93 08/17/2019   LDLCALC 120 (H) 08/17/2019   TRIG 138 08/17/2019   CHOLHDL 2.5 08/17/2019  On omega-3 fatty acids and flaxseed oil.  Last eye exam: 04/2019 - No DR. Off Plaquenil.  She has family history of diabetes in brother.  She has fatty liver - lost weight (20 lbs) 3 years ago, but gained  some back.  She sees Dr. Corliss Skainseveshwar for OA and pseudogout >> on Plaquenil and Colchicine.  She has a history of kidney stones.   ROS: Constitutional: no weight gain/no weight loss, no fatigue, no subjective hyperthermia, no subjective hypothermia Eyes: no blurry vision, no xerophthalmia ENT: no sore throat, + see HPI Cardiovascular: no CP/no SOB/no palpitations/no leg swelling Respiratory: no cough/no SOB/no wheezing Gastrointestinal: no N/no V/no D/no C/no acid reflux Musculoskeletal: + muscle aches/+ joint aches Skin: no rashes, no hair loss Neurological: no tremors/no numbness/no tingling/no dizziness  I reviewed pt's medications, allergies, PMH, social hx, family hx, and changes were documented in the history of present illness. Otherwise, unchanged from my initial visit note.  Past Medical History:  Diagnosis Date  . Arthritis   . Chest pain 06/2013   Left sided  . Chronic leg pain 06/2013  . Complication of anesthesia   . Dysrhythmia   . Fatigue   . GERD (gastroesophageal reflux disease)   . History of kidney stones   . Hypertension   . Hypothyroidism   . Nonproductive cough 06/2013  . Overweight 01/18/2014  . Peripheral neuropathy   . PONV (postoperative nausea and vomiting)   . Sleep difficulties    Change in sleep patterns  . SOB (shortness of breath) 06/2013  . Tachycardia 06/2013  . Thyroid disease    hypo  . UTI (lower urinary tract infection)   . Vesico-ureteric reflux    Past Surgical History:  Procedure Laterality Date  . ABDOMINAL SURGERY  1994  . CYSTOSCOPY/URETEROSCOPY/HOLMIUM LASER/STENT PLACEMENT Left 09/22/2017   Procedure: CYSTOSCOPY/RETROGRADE/URETEROSCOPY/ BASKET STONE EXTRACTION/STENT PLACEMENT;  Surgeon: Rene PaciWinter, Christopher Aaron, MD;  Location: [facility];  Service: Urology;  Laterality: Left;  ONLY NEEDS 60 MIN  . FOOT SURGERY Right 2009  . KIDNEY SURGERY Left 1993  . KNEE ARTHROPLASTY Right 11/02/2019  . KNEE SURGERY     Multiple knee surgeries   . OOPHORECTOMY Left 1994  . 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 Thurston HoleWainer  . small fiber neuropathy biopsy  12/2017  . TONSILLECTOMY    . URETEROSCOPY VIA URETEROSTOMY Left 1994   Social History   Socioeconomic History  . Marital status: Married    Spouse name: Not on file  . Number of children: 0  . Years of education: Not on file  . Highest education level: Not on file  Occupational History  . Occupation: OrthoptistBusiness and financial analyst  Tobacco Use  . Smoking status: Never Smoker  . Smokeless tobacco: Never Used  Vaping Use  . Vaping Use: Never used  Substance and Sexual Activity  . Alcohol use: Yes    Alcohol/week: 1.0 - 2.0 standard drink    Types: 1 - 2 Glasses of wine per week    Comment: Twice a week  . Drug use: No  . Sexual activity: Yes    Birth control/protection: None  Other Topics Concern  . Not on file  Social History Narrative  . Not on file   Social Determinants of Health   Financial Resource Strain: Not on file  Food Insecurity: Not on file  Transportation Needs: Not on file  Physical Activity: Not on file  Stress: Not on file  Social Connections: Not on file  Intimate Partner Violence: Not on file   Current Outpatient Medications on File Prior to Visit  Medication Sig Dispense Refill  . azithromycin (ZITHROMAX) 250 MG tablet Take 2 tablets by mouth on day 1. Take 1 tablet by mouth x 4 days. 6 tablet 0  . benzonatate (TESSALON) 100 MG capsule Take 1 capsule (100 mg total) by mouth 3 (three) times daily as needed for cough. 30 capsule 0  . Cholecalciferol (VITAMIN D) 50 MCG (2000 UT) CAPS 2,000 daily OTC in addition to once weekly prescription 30 capsule   . CREAM BASE EX Apply 1 application topically daily. BIEST Transdermal Cream 0.5 mg: Compounded bio-identical Estriol (E3) combined by [facility]    . escitalopram (LEXAPRO) 10 MG tablet Take 1 tablet (10 mg total) by mouth at bedtime. 90 tablet 0  . eszopiclone (LUNESTA) 1 MG  TABS tablet Take 1 mg by mouth at bedtime as needed.    . famotidine (PEPCID) 40 MG tablet Take 40 mg by mouth daily as needed (for heartburn/indigestion.).   2  . Flaxseed, Linseed, (FLAX SEED OIL) 1000 MG CAPS Take 2,000 mg by mouth every evening.    . gabapentin (NEURONTIN) 300 MG capsule Take 300 mg by mouth at bedtime as needed.    . hydroxychloroquine (PLAQUENIL) 200 MG tablet TAKE 1 TABLET BY MOUTH TWICE A DAY MONDAY THROUGH FRIDAY 120 tablet 0  . hydrOXYzine (ATARAX/VISTARIL) 25 MG tablet Take 1-2 tabs by mouth every 6-8 hrs PRN anxiety 90 tablet 0  . Magnesium 500 MG CAPS Take 1,000 mg by mouth every evening.    Marland Kitchen MITIGARE 0.6 MG CAPS TAKE 1 CAPSULE BY MOUTH DAILY. 90 capsule 0  . nitrofurantoin, macrocrystal-monohydrate, (MACROBID) 100 MG capsule Take by mouth daily as needed. UTI prevention    . NONFORMULARY OR COMPOUNDED ITEM Take 150 mg by mouth at bedtime. Bio identical Progesterone 150 ([facility])    . Omega-3 Fatty Acids (FISH OIL) 1200 MG CAPS Take 2,400 mg by mouth every evening.    Marland Kitchen SYNTHROID 112 MCG tablet Take 1 tablet (112 mcg total) by mouth daily before breakfast. 90 tablet 3  . Vitamin D, Ergocalciferol, (DRISDOL) 1.25 MG (50000 UNIT) CAPS capsule Take 1 capsule (50,000 Units total) by mouth every 7 (seven) days. 12 capsule 1   No current facility-administered medications on file prior to visit.   Allergies  Allergen Reactions  . Penicillins Hives and Itching    Has patient had a PCN reaction causing immediate rash, facial/tongue/throat swelling, SOB or lightheadedness with hypotension: No Has patient had a PCN reaction causing severe rash involving mucus membranes or skin necrosis: No Has patient had a PCN reaction that required hospitalization: No Has patient had a PCN reaction occurring within the last 10 years: No If all of the above answers are "NO", then may proceed with Cephalosporin use.   . Sulfa Antibiotics Hives and Itching    Bactrim    Family History  Problem Relation Age of Onset  . Migraines Other        Fam Hx   . Heart disease Other        Fam Hx  . Thyroid disease Other  Fam Hx - She also has thyroid disease  . Diabetes Other        Fam Hx  . Arthritis Other        Fam Hx  . Gallbladder disease Other        Fam Hx  . Cancer Other        Fam Hx - Pancreatic  . Hypertension Other        Fam Hx  . Heart attack Cousin        Multiple cousins with MI's before 44 years of age  . Heart disease Mother 61  . Hyperlipidemia Mother 72  . Hypertension Mother 3  . Stroke Mother 46  . Heart disease Maternal Grandmother   . Cancer Father 66       pancreatic  . Diabetes Brother 50  . Heart attack Maternal Uncle   . Heart attack Paternal Aunt    PE: BP 140/88 (BP Location: Right Arm, Patient Position: Sitting, Cuff Size: Normal)   Pulse 70   Ht 5\' 6"  (1.676 m)   Wt 198 lb 12.8 oz (90.2 kg)   SpO2 99%   BMI 32.09 kg/m  Wt Readings from Last 3 Encounters:  03/09/20 198 lb 12.8 oz (90.2 kg)  01/31/20 189 lb (85.7 kg)  12/20/19 197 lb (89.4 kg)   Constitutional: overweight, in NAD Eyes: PERRLA, EOMI, no exophthalmos ENT: moist mucous membranes, no thyromegaly, no cervical lymphadenopathy Cardiovascular: RRR, No MRG Respiratory: CTA B Gastrointestinal: abdomen soft, NT, ND, BS+ Musculoskeletal: no deformities, strength intact in all 4 Skin: moist, warm, no rashes Neurological: no tremor with outstretched hands, DTR normal in all 4  ASSESSMENT: 1.  Acquired hypothyroidism -No clear evidence of Hashimoto's thyroiditis  2.  Mild prediabetes  PLAN:  1. Patient with longstanding hypothyroidism, Synthroid d.a.w. - latest thyroid labs reviewed with pt >> normal: Lab Results  Component Value Date   TSH 1.300 08/17/2019   - she continues on Synthroid d.a.w. 112 mcg daily - pt feels good on this dose. - we discussed about taking the thyroid hormone every day, with water, >30 minutes before  breakfast, separated by >4 hours from acid reflux medications, calcium, iron, multivitamins. Pt. is taking it correctly. - will check thyroid tests today: TSH and fT4 - If labs are abnormal, she will need to return for repeat TFTs in 1.5 months  2.  Mild prediabetes -She had 2 HbA1c levels in the prediabetic range before, while at last visit, HbA1c was normal, improved, at 5.5%. -At today's visit, HbA1c is 5.8%, slightly higher -Most likely increase in HbA1c is related to her decreased activity after her TKR and in the setting of back pain, and also steroids and gabapentin. No medication is absolutely for now, but will continue to work on her diet and is planning to come off gabapentin  soon. Net weight is stable since last visit. -I offered to refer her to nutrition  and abut she wanted to old off the referral at that time.  We will readdress this at next visit. -We can continue to follow her without necessarily checking daily blood sugars -I we will recheck the HbA1c when she returns to the clinic, in 6 months  Refills per [facility].  Component     Latest Ref Rng & Units 03/09/2020  T4,Free(Direct)     0.60 - 1.60 ng/dL 05/09/2020  TSH     6.57 - 8.46 uIU/mL 0.44  Thyroid tests are normal.   9.62, MD  PhD [facility]

## 2020-03-12 ENCOUNTER — Other Ambulatory Visit: Payer: Self-pay | Admitting: Physician Assistant

## 2020-03-12 DIAGNOSIS — E559 Vitamin D deficiency, unspecified: Secondary | ICD-10-CM

## 2020-03-12 DIAGNOSIS — G479 Sleep disorder, unspecified: Secondary | ICD-10-CM

## 2020-03-12 DIAGNOSIS — F4323 Adjustment disorder with mixed anxiety and depressed mood: Secondary | ICD-10-CM

## 2020-03-19 ENCOUNTER — Encounter: Payer: Self-pay | Admitting: Internal Medicine

## 2020-03-19 ENCOUNTER — Other Ambulatory Visit: Payer: Self-pay | Admitting: Physician Assistant

## 2020-03-19 DIAGNOSIS — E559 Vitamin D deficiency, unspecified: Secondary | ICD-10-CM

## 2020-03-19 DIAGNOSIS — G479 Sleep disorder, unspecified: Secondary | ICD-10-CM

## 2020-03-19 DIAGNOSIS — F4323 Adjustment disorder with mixed anxiety and depressed mood: Secondary | ICD-10-CM

## 2020-04-02 ENCOUNTER — Other Ambulatory Visit: Payer: Self-pay | Admitting: Internal Medicine

## 2020-04-18 DIAGNOSIS — Z01419 Encounter for gynecological examination (general) (routine) without abnormal findings: Secondary | ICD-10-CM | POA: Diagnosis not present

## 2020-04-18 DIAGNOSIS — B373 Candidiasis of vulva and vagina: Secondary | ICD-10-CM | POA: Diagnosis not present

## 2020-04-18 DIAGNOSIS — Z6834 Body mass index (BMI) 34.0-34.9, adult: Secondary | ICD-10-CM | POA: Diagnosis not present

## 2020-04-18 DIAGNOSIS — Z1231 Encounter for screening mammogram for malignant neoplasm of breast: Secondary | ICD-10-CM | POA: Diagnosis not present

## 2020-04-18 LAB — HM MAMMOGRAPHY

## 2020-04-18 LAB — HM DEXA SCAN

## 2020-05-04 NOTE — Progress Notes (Deleted)
Office Visit Note  Patient: Marissa Larson             Date of Birth: 03/09/1955           MRN: [id number]             PCP: Mayer Masker, PA-C Referring: Mayer Masker, PA-C Visit Date: 05/18/2020 Occupation: @GUAROCC @  Subjective:  No chief complaint on file.   History of Present Illness: Marissa Larson is a 65 y.o. female ***   Activities of Daily Living:  Patient reports morning stiffness for *** {minute/hour:19697}.   Patient {ACTIONS;DENIES/REPORTS:21021675::"Denies"} nocturnal pain.  Difficulty dressing/grooming: {ACTIONS;DENIES/REPORTS:21021675::"Denies"} Difficulty climbing stairs: {ACTIONS;DENIES/REPORTS:21021675::"Denies"} Difficulty getting out of chair: {ACTIONS;DENIES/REPORTS:21021675::"Denies"} Difficulty using hands for taps, buttons, cutlery, and/or writing: {ACTIONS;DENIES/REPORTS:21021675::"Denies"}  No Rheumatology ROS completed.   PMFS History:  Patient Active Problem List   Diagnosis Date Noted  . Elevated LDL cholesterol level 07/19/2018  . Prediabetes 07/19/2018  . Paresthesia 12/09/2017  . Chronic insomnia 11/24/2017  . Insomnia secondary to chronic pain 11/24/2017  . Complaint related to dreams 11/24/2017  . 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 Neuropathy 08/18/2017  . 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 Obesity, Class I, BMI 30-34.9 12/03/2016  . Kidney stones 09/12/2016  . Nausea 09/12/2016  . High risk medication use 07/30/2016  . NAFLD (nonalcoholic fatty liver disease) 08/01/2016  . Primary osteoarthritis of both feet 04/12/2016  . History of kidney stones 04/12/2016  . Primary osteoarthritis of both hands 04/11/2016  . Bilateral primary osteoarthritis of hip 04/11/2016  . Primary osteoarthritis of both knees 04/11/2016  . Spondylosis of lumbar region without myelopathy or radiculopathy 04/11/2016  . Hyperuricemia 04/11/2016   . Primary insomnia 04/11/2016  . Flank pain 04/02/2016  . Sinusitis 04/02/2016  . Elevated HDL 11/11/2015  . Elevated LFTs 11/11/2015  . Adjustment disorder with mixed anxiety and depressed mood 11/11/2015  . Dysuria 11/11/2015  . Hypothyroidism 10/14/2015  . Pseudogout involving multiple joints 10/14/2015  . Vitamin D deficiency 10/14/2015  . Hormone replacement therapy- per GYN 10/14/2015  . HTN (hypertension) 10/14/2015  . Fatigue 10/14/2015  . h/o Hiatal hernia 10/14/2015  . Sleep difficulties 10/14/2015  . Generalized OA 10/14/2015  . Counseling on health promotion and disease prevention 10/14/2015  . 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 Age-related nuclear cataract of both eyes 01/25/2015  . Posterior vitreous detachment of left eye 01/25/2015  . chronic Palpitations 07/19/2013    Past Medical History:  Diagnosis Date  . Arthritis   . Chest pain 06/2013   Left sided  . Chronic leg pain 06/2013  . Complication of anesthesia   . Dysrhythmia   . Fatigue   . GERD (gastroesophageal reflux disease)   . History of kidney stones   . Hypertension   . Hypothyroidism   . Nonproductive cough 06/2013  . Overweight 01/18/2014  . Peripheral neuropathy   . PONV (postoperative nausea and vomiting)   . Sleep difficulties    Change in sleep patterns  . SOB (shortness of breath) 06/2013  . Tachycardia 06/2013  . Thyroid disease    hypo  . UTI (lower urinary tract infection)   . Vesico-ureteric reflux     Family History  Problem Relation Age of Onset  . Migraines Other        Fam Hx   . Heart disease Other        Fam  Hx  . Thyroid disease Other        Fam Hx - She also has thyroid disease  . Diabetes Other        Fam Hx  . Arthritis Other        Fam Hx  . Gallbladder disease Other        Fam Hx  . Cancer Other        Fam Hx - Pancreatic  . Hypertension Other        Fam Hx  . Heart attack Cousin        Multiple cousins with MI's before 69 years of age  . Heart  disease Mother 11  . Hyperlipidemia Mother 42  . Hypertension Mother 22  . Stroke Mother 44  . Heart disease Maternal Grandmother   . Cancer Father 3       pancreatic  . Diabetes Brother 50  . Heart attack Maternal Uncle   . Heart attack Paternal Aunt    Past Surgical History:  Procedure Laterality Date  . ABDOMINAL SURGERY  1994  . CYSTOSCOPY/URETEROSCOPY/HOLMIUM LASER/STENT PLACEMENT Left 09/22/2017   Procedure: CYSTOSCOPY/RETROGRADE/URETEROSCOPY/ BASKET STONE EXTRACTION/STENT PLACEMENT;  Surgeon: Rene Paci, MD;  Location: [facility];  Service: Urology;  Laterality: Left;  ONLY NEEDS 60 MIN  . FOOT SURGERY Right 2009  . KIDNEY SURGERY Left 1993  . KNEE ARTHROPLASTY Right 11/02/2019  . KNEE SURGERY     Multiple knee surgeries  . OOPHORECTOMY Left 1994  . 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 Thurston Hole  . small fiber neuropathy biopsy  12/2017  . TONSILLECTOMY    . URETEROSCOPY VIA URETEROSTOMY Left 1994   Social History   Social History Narrative  . Not on file   Immunization History  Administered Date(s) Administered  . Tdap 01/07/2011     Objective: Vital Signs: There were no vitals taken for this visit.   Physical Exam   Musculoskeletal Exam: ***  CDAI Exam: CDAI Score: -- Patient Global: --; Provider Global: -- Swollen: --; Tender: -- Joint Exam 05/18/2020   No joint exam has been documented for this visit   There is currently no information documented on the homunculus. Go to the Rheumatology activity and complete the homunculus joint exam.  Investigation: No additional findings.  Imaging: No results found.  Recent Labs: Lab Results  Component Value Date   WBC 9.3 10/13/2019   HGB 15.1 (H) 10/13/2019   PLT 239 10/13/2019   NA 136 10/13/2019   K 3.7 10/13/2019   CL 101 10/13/2019   CO2 25 10/13/2019   GLUCOSE 100 (H) 10/13/2019   BUN 19 10/13/2019   CREATININE 0.45 10/13/2019   BILITOT 0.3 08/17/2019   ALKPHOS 92  08/17/2019   AST 27 08/17/2019   ALT 25 08/17/2019   PROT 6.9 08/17/2019   ALBUMIN 4.7 08/17/2019   CALCIUM 9.6 10/13/2019   GFRAA 108 08/17/2019   QFTBGOLDPLUS NEGATIVE 02/16/2017    Speciality Comments: PLQ eye exam: 04/20/2019 normal. Dr. Burgess Estelle. Follow up in 1 year.  Procedures:  No procedures performed Allergies: Penicillins and Sulfa antibiotics   Assessment / Plan:     Visit Diagnoses: No diagnosis found.  Orders: No orders of the defined types were placed in this encounter.  No orders of the defined types were placed in this encounter.   Face-to-face time spent with patient was *** minutes. Greater than 50% of time was spent in counseling and coordination of care.  Follow-Up Instructions: No follow-ups  on file.   Earnestine Mealing, CMA  Note - This record has been created using Editor, commissioning.  Chart creation errors have been sought, but may not always  have been located. Such creation errors do not reflect on  the standard of medical care.

## 2020-05-08 ENCOUNTER — Encounter: Payer: Self-pay | Admitting: Physician Assistant

## 2020-05-18 ENCOUNTER — Ambulatory Visit: Payer: BC Managed Care – PPO | Admitting: Physician Assistant

## 2020-05-18 DIAGNOSIS — I10 Essential (primary) hypertension: Secondary | ICD-10-CM

## 2020-05-18 DIAGNOSIS — M1990 Unspecified osteoarthritis, unspecified site: Secondary | ICD-10-CM

## 2020-05-18 DIAGNOSIS — Z96651 Presence of right artificial knee joint: Secondary | ICD-10-CM

## 2020-05-18 DIAGNOSIS — M1189 Other specified crystal arthropathies, multiple sites: Secondary | ICD-10-CM

## 2020-05-18 DIAGNOSIS — M160 Bilateral primary osteoarthritis of hip: Secondary | ICD-10-CM

## 2020-05-18 DIAGNOSIS — Z87442 Personal history of urinary calculi: Secondary | ICD-10-CM

## 2020-05-18 DIAGNOSIS — H524 Presbyopia: Secondary | ICD-10-CM | POA: Diagnosis not present

## 2020-05-18 DIAGNOSIS — H5203 Hypermetropia, bilateral: Secondary | ICD-10-CM | POA: Diagnosis not present

## 2020-05-18 DIAGNOSIS — E559 Vitamin D deficiency, unspecified: Secondary | ICD-10-CM

## 2020-05-18 DIAGNOSIS — H2513 Age-related nuclear cataract, bilateral: Secondary | ICD-10-CM | POA: Diagnosis not present

## 2020-05-18 DIAGNOSIS — K449 Diaphragmatic hernia without obstruction or gangrene: Secondary | ICD-10-CM

## 2020-05-18 DIAGNOSIS — E790 Hyperuricemia without signs of inflammatory arthritis and tophaceous disease: Secondary | ICD-10-CM

## 2020-05-18 DIAGNOSIS — G629 Polyneuropathy, unspecified: Secondary | ICD-10-CM

## 2020-05-18 DIAGNOSIS — M19041 Primary osteoarthritis, right hand: Secondary | ICD-10-CM

## 2020-05-18 DIAGNOSIS — Z96652 Presence of left artificial knee joint: Secondary | ICD-10-CM

## 2020-05-18 DIAGNOSIS — M19072 Primary osteoarthritis, left ankle and foot: Secondary | ICD-10-CM

## 2020-05-18 DIAGNOSIS — F5101 Primary insomnia: Secondary | ICD-10-CM

## 2020-05-18 DIAGNOSIS — Z79899 Other long term (current) drug therapy: Secondary | ICD-10-CM

## 2020-05-18 DIAGNOSIS — M47816 Spondylosis without myelopathy or radiculopathy, lumbar region: Secondary | ICD-10-CM

## 2020-05-18 DIAGNOSIS — H43813 Vitreous degeneration, bilateral: Secondary | ICD-10-CM | POA: Diagnosis not present

## 2020-05-18 DIAGNOSIS — K760 Fatty (change of) liver, not elsewhere classified: Secondary | ICD-10-CM

## 2020-05-18 NOTE — Progress Notes (Deleted)
Office Visit Note  Patient: Marissa Larson             Date of Birth: Nov 29, 1955           MRN: [id number]             PCP: Mayer Masker, PA-C Referring: Mayer Masker, PA-C Visit Date: 06/01/2020 Occupation: @GUAROCC @  Subjective:  No chief complaint on file.   History of Present Illness: Marissa Larson is a 65 y.o. female ***   Activities of Daily Living:  Patient reports morning stiffness for *** {minute/hour:19697}.   Patient {ACTIONS;DENIES/REPORTS:21021675::"Denies"} nocturnal pain.  Difficulty dressing/grooming: {ACTIONS;DENIES/REPORTS:21021675::"Denies"} Difficulty climbing stairs: {ACTIONS;DENIES/REPORTS:21021675::"Denies"} Difficulty getting out of chair: {ACTIONS;DENIES/REPORTS:21021675::"Denies"} Difficulty using hands for taps, buttons, cutlery, and/or writing: {ACTIONS;DENIES/REPORTS:21021675::"Denies"}  No Rheumatology ROS completed.   PMFS History:  Patient Active Problem List   Diagnosis Date Noted  . Elevated LDL cholesterol level 07/19/2018  . Prediabetes 07/19/2018  . Paresthesia 12/09/2017  . Chronic insomnia 11/24/2017  . Insomnia secondary to chronic pain 11/24/2017  . Complaint related to dreams 11/24/2017  . 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 Neuropathy 08/18/2017  . 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 Obesity, Class I, BMI 30-34.9 12/03/2016  . Kidney stones 09/12/2016  . Nausea 09/12/2016  . High risk medication use 07/30/2016  . NAFLD (nonalcoholic fatty liver disease) 08/01/2016  . Primary osteoarthritis of both feet 04/12/2016  . History of kidney stones 04/12/2016  . Primary osteoarthritis of both hands 04/11/2016  . Bilateral primary osteoarthritis of hip 04/11/2016  . Primary osteoarthritis of both knees 04/11/2016  . Spondylosis of lumbar region without myelopathy or radiculopathy 04/11/2016  . Hyperuricemia 04/11/2016   . Primary insomnia 04/11/2016  . Flank pain 04/02/2016  . Sinusitis 04/02/2016  . Elevated HDL 11/11/2015  . Elevated LFTs 11/11/2015  . Adjustment disorder with mixed anxiety and depressed mood 11/11/2015  . Dysuria 11/11/2015  . Hypothyroidism 10/14/2015  . Pseudogout involving multiple joints 10/14/2015  . Vitamin D deficiency 10/14/2015  . Hormone replacement therapy- per GYN 10/14/2015  . HTN (hypertension) 10/14/2015  . Fatigue 10/14/2015  . h/o Hiatal hernia 10/14/2015  . Sleep difficulties 10/14/2015  . Generalized OA 10/14/2015  . Counseling on health promotion and disease prevention 10/14/2015  . 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 Age-related nuclear cataract of both eyes 01/25/2015  . Posterior vitreous detachment of left eye 01/25/2015  . chronic Palpitations 07/19/2013    Past Medical History:  Diagnosis Date  . Arthritis   . Chest pain 06/2013   Left sided  . Chronic leg pain 06/2013  . Complication of anesthesia   . Dysrhythmia   . Fatigue   . GERD (gastroesophageal reflux disease)   . History of kidney stones   . Hypertension   . Hypothyroidism   . Nonproductive cough 06/2013  . Overweight 01/18/2014  . Peripheral neuropathy   . PONV (postoperative nausea and vomiting)   . Sleep difficulties    Change in sleep patterns  . SOB (shortness of breath) 06/2013  . Tachycardia 06/2013  . Thyroid disease    hypo  . UTI (lower urinary tract infection)   . Vesico-ureteric reflux     Family History  Problem Relation Age of Onset  . Migraines Other        Fam Hx   . Heart disease Other        Fam  Hx  . Thyroid disease Other        Fam Hx - She also has thyroid disease  . Diabetes Other        Fam Hx  . Arthritis Other        Fam Hx  . Gallbladder disease Other        Fam Hx  . Cancer Other        Fam Hx - Pancreatic  . Hypertension Other        Fam Hx  . Heart attack Cousin        Multiple cousins with MI's before 34 years of age  . Heart  disease Mother 56  . Hyperlipidemia Mother 42  . Hypertension Mother 9  . Stroke Mother 77  . Heart disease Maternal Grandmother   . Cancer Father 80       pancreatic  . Diabetes Brother 50  . Heart attack Maternal Uncle   . Heart attack Paternal Aunt    Past Surgical History:  Procedure Laterality Date  . ABDOMINAL SURGERY  1994  . CYSTOSCOPY/URETEROSCOPY/HOLMIUM LASER/STENT PLACEMENT Left 09/22/2017   Procedure: CYSTOSCOPY/RETROGRADE/URETEROSCOPY/ BASKET STONE EXTRACTION/STENT PLACEMENT;  Surgeon: Rene Paci, MD;  Location: [facility];  Service: Urology;  Laterality: Left;  ONLY NEEDS 60 MIN  . FOOT SURGERY Right 2009  . KIDNEY SURGERY Left 1993  . KNEE ARTHROPLASTY Right 11/02/2019  . KNEE SURGERY     Multiple knee surgeries  . OOPHORECTOMY Left 1994  . 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 Thurston Hole  . small fiber neuropathy biopsy  12/2017  . TONSILLECTOMY    . URETEROSCOPY VIA URETEROSTOMY Left 1994   Social History   Social History Narrative  . Not on file   Immunization History  Administered Date(s) Administered  . Tdap 01/07/2011     Objective: Vital Signs: There were no vitals taken for this visit.   Physical Exam   Musculoskeletal Exam: ***  CDAI Exam: CDAI Score: -- Patient Global: --; Provider Global: -- Swollen: --; Tender: -- Joint Exam 06/01/2020   No joint exam has been documented for this visit   There is currently no information documented on the homunculus. Go to the Rheumatology activity and complete the homunculus joint exam.  Investigation: No additional findings.  Imaging: No results found.  Recent Labs: Lab Results  Component Value Date   WBC 9.3 10/13/2019   HGB 15.1 (H) 10/13/2019   PLT 239 10/13/2019   NA 136 10/13/2019   K 3.7 10/13/2019   CL 101 10/13/2019   CO2 25 10/13/2019   GLUCOSE 100 (H) 10/13/2019   BUN 19 10/13/2019   CREATININE 0.45 10/13/2019   BILITOT 0.3 08/17/2019   ALKPHOS 92  08/17/2019   AST 27 08/17/2019   ALT 25 08/17/2019   PROT 6.9 08/17/2019   ALBUMIN 4.7 08/17/2019   CALCIUM 9.6 10/13/2019   GFRAA 108 08/17/2019   QFTBGOLDPLUS NEGATIVE 02/16/2017    Speciality Comments: PLQ eye exam: 04/20/2019 normal. Dr. Burgess Estelle. Follow up in 1 year.  Procedures:  No procedures performed Allergies: Penicillins and Sulfa antibiotics   Assessment / Plan:     Visit Diagnoses: Inflammatory arthritis  High risk medication use  Pseudogout involving multiple joints  Primary osteoarthritis of both hands  Bilateral primary osteoarthritis of hip  S/P total knee arthroplasty, right  S/P total knee replacement, left  Primary osteoarthritis of both feet  Spondylosis of lumbar region without myelopathy or radiculopathy  Hyperuricemia  NAFLD (nonalcoholic  fatty liver disease)  Essential hypertension  Small fiber neuropathy  Primary insomnia  History of kidney stones  h/o Hiatal hernia  Vitamin D deficiency  Orders: No orders of the defined types were placed in this encounter.  No orders of the defined types were placed in this encounter.   Face-to-face time spent with patient was *** minutes. Greater than 50% of time was spent in counseling and coordination of care.  Follow-Up Instructions: No follow-ups on file.   Gearldine Bienenstock, PA-C  Note - This record has been created using Dragon software.  Chart creation errors have been sought, but may not always  have been located. Such creation errors do not reflect on  the standard of medical care.

## 2020-05-21 ENCOUNTER — Ambulatory Visit: Payer: BC Managed Care – PPO | Admitting: Physician Assistant

## 2020-06-01 ENCOUNTER — Ambulatory Visit: Payer: BC Managed Care – PPO | Admitting: Physician Assistant

## 2020-06-06 ENCOUNTER — Ambulatory Visit (INDEPENDENT_AMBULATORY_CARE_PROVIDER_SITE_OTHER): Payer: BC Managed Care – PPO | Admitting: Physician Assistant

## 2020-06-06 ENCOUNTER — Other Ambulatory Visit: Payer: Self-pay | Admitting: Rheumatology

## 2020-06-06 ENCOUNTER — Other Ambulatory Visit: Payer: BC Managed Care – PPO

## 2020-06-06 ENCOUNTER — Other Ambulatory Visit: Payer: Self-pay

## 2020-06-06 ENCOUNTER — Encounter: Payer: Self-pay | Admitting: Physician Assistant

## 2020-06-06 VITALS — BP 133/76 | HR 84 | Temp 98.1°F | Ht 66.0 in | Wt 196.9 lb

## 2020-06-06 DIAGNOSIS — R300 Dysuria: Secondary | ICD-10-CM | POA: Diagnosis not present

## 2020-06-06 DIAGNOSIS — I10 Essential (primary) hypertension: Secondary | ICD-10-CM

## 2020-06-06 DIAGNOSIS — F5101 Primary insomnia: Secondary | ICD-10-CM

## 2020-06-06 DIAGNOSIS — M1990 Unspecified osteoarthritis, unspecified site: Secondary | ICD-10-CM

## 2020-06-06 DIAGNOSIS — E039 Hypothyroidism, unspecified: Secondary | ICD-10-CM

## 2020-06-06 DIAGNOSIS — R350 Frequency of micturition: Secondary | ICD-10-CM | POA: Diagnosis not present

## 2020-06-06 DIAGNOSIS — F4323 Adjustment disorder with mixed anxiety and depressed mood: Secondary | ICD-10-CM

## 2020-06-06 LAB — POCT URINALYSIS DIPSTICK
Bilirubin, UA: NEGATIVE
Glucose, UA: NEGATIVE
Ketones, UA: NEGATIVE
Leukocytes, UA: NEGATIVE
Nitrite, UA: NEGATIVE
Protein, UA: NEGATIVE
Spec Grav, UA: >=1.03 — AB (ref 1.010–1.025)
Urobilinogen, UA: 0.2 E.U./dL
pH, UA: 5.5 (ref 5.0–8.0)

## 2020-06-06 MED ORDER — ESCITALOPRAM OXALATE 20 MG PO TABS: 20.0000 mg | ORAL_TABLET | Freq: Every day | ORAL | 0 refills | Status: DC

## 2020-06-06 NOTE — Patient Instructions (Signed)

## 2020-06-06 NOTE — Progress Notes (Signed)
Established Patient Office Visit  Subjective:  Patient ID: Marissa DomeMichele Kaigler, female    DOB: Feb 08, 1955  Age: 65 y.o. MRN: [id number]  CC:  Chief Complaint  Patient presents with  . Follow-up    Sleep  . Hypertension    HPI Marissa Larson presents for follow up on hypertension, insomnia and mood management. Patient reports no longer taking gabapentin because was having med side effects with cognition impairment. Reports stopped (didn't resume after knee sx) some of her medications in effort to see if some of her meds were contributing to her insomnia and fatigue. Has c/o dysuria, urinary frequency, malodorous urine. Denies fever, abdominal pain, low back pain, or hematuria. Stopped prophylactic antibiotic.   HTN: Pt denies chest pain, palpitations, dizziness or leg swelling. Patient managing with diet. Checks BP at home and readings range 130s/70-80s. Pt follows a low salt diet.  Insomnia: Patient reports continues to have trouble with falling asleep. Takes Lexapro, Lunesta and hydroxyzine at night and still has trouble. States has been on MineralLunesta for some time. Sometimes has a difficult time unwinding at night. States has limited caffeine and listened to meditation to help with relaxing at night with minimal efficacy.   Mood: Patient continues to have work stress and is planning to retire in August. Reports short staffing at work has lead to longer work hours and more responsibilities. Taking Lexapro without issues. Denies SI/HI. No significant changes with anxiety.   Past Medical History:  Diagnosis Date  . Arthritis   . Chest pain 06/2013   Left sided  . Chronic leg pain 06/2013  . Complication of anesthesia   . Dysrhythmia   . Fatigue   . GERD (gastroesophageal reflux disease)   . History of kidney stones   . Hypertension   . Hypothyroidism   . Nonproductive cough 06/2013  . Overweight 01/18/2014  . Peripheral neuropathy   . PONV (postoperative nausea and vomiting)   . Sleep  difficulties    Change in sleep patterns  . SOB (shortness of breath) 06/2013  . Tachycardia 06/2013  . Thyroid disease    hypo  . UTI (lower urinary tract infection)   . Vesico-ureteric reflux     Past Surgical History:  Procedure Laterality Date  . ABDOMINAL SURGERY  1994  . CYSTOSCOPY/URETEROSCOPY/HOLMIUM LASER/STENT PLACEMENT Left 09/22/2017   Procedure: CYSTOSCOPY/RETROGRADE/URETEROSCOPY/ BASKET STONE EXTRACTION/STENT PLACEMENT;  Surgeon: Rene PaciWinter, Christopher Aaron, MD;  Location: [facility];  Service: Urology;  Laterality: Left;  ONLY NEEDS 60 MIN  . FOOT SURGERY Right 2009  . KIDNEY SURGERY Left 1993  . KNEE ARTHROPLASTY Right 11/02/2019  . KNEE SURGERY     Multiple knee surgeries  . OOPHORECTOMY Left 1994  . 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 Thurston HoleWainer  . small fiber neuropathy biopsy  12/2017  . TONSILLECTOMY    . URETEROSCOPY VIA URETEROSTOMY Left 1994    Family History  Problem Relation Age of Onset  . Migraines Other        Fam Hx   . Heart disease Other        Fam Hx  . Thyroid disease Other        Fam Hx - She also has thyroid disease  . Diabetes Other        Fam Hx  . Arthritis Other        Fam Hx  . Gallbladder disease Other        Fam Hx  . Cancer Other  Fam Hx - Pancreatic  . Hypertension Other        Fam Hx  . Heart attack Cousin        Multiple cousins with MI's before 35 years of age  . Heart disease Mother 29  . Hyperlipidemia Mother 35  . Hypertension Mother 59  . Stroke Mother 49  . Heart disease Maternal Grandmother   . Cancer Father 38       pancreatic  . Diabetes Brother 50  . Heart attack Maternal Uncle   . Heart attack Paternal Aunt     Social History   Socioeconomic History  . Marital status: Married    Spouse name: Not on file  . Number of children: 0  . Years of education: Not on file  . Highest education level: Not on file  Occupational History  . Occupation: Orthoptist  Tobacco Use  .  Smoking status: Never Smoker  . Smokeless tobacco: Never Used  Vaping Use  . Vaping Use: Never used  Substance and Sexual Activity  . Alcohol use: Yes    Alcohol/week: 1.0 - 2.0 standard drink    Types: 1 - 2 Glasses of wine per week    Comment: Twice a week  . Drug use: No  . Sexual activity: Yes    Birth control/protection: None  Other Topics Concern  . Not on file  Social History Narrative  . Not on file   Social Determinants of Health   Financial Resource Strain: Not on file  Food Insecurity: Not on file  Transportation Needs: Not on file  Physical Activity: Not on file  Stress: Not on file  Social Connections: Not on file  Intimate Partner Violence: Not on file    Outpatient Medications Prior to Visit  Medication Sig Dispense Refill  . Cholecalciferol (VITAMIN D) 50 MCG (2000 UT) CAPS 2,000 daily OTC in addition to once weekly prescription 30 capsule   . CREAM BASE EX Apply 1 application topically daily. BIEST Transdermal Cream 0.5 mg: Compounded bio-identical Estriol (E3) combined by [facility]    . eszopiclone (LUNESTA) 1 MG TABS tablet Take 1 mg by mouth at bedtime as needed.    . famotidine (PEPCID) 40 MG tablet Take 40 mg by mouth daily as needed (for heartburn/indigestion.).   2  . Flaxseed, Linseed, (FLAX SEED OIL) 1000 MG CAPS Take 2,000 mg by mouth every evening.    . hydrOXYzine (ATARAX/VISTARIL) 25 MG tablet TAKE 1 TO 2 TABLETS BY MOUTH EVERY 6 TO 8 HOURS AS NEEDED FOR ANXIETY 90 tablet 0  . Magnesium 500 MG CAPS Take 1,000 mg by mouth every evening.    . NONFORMULARY OR COMPOUNDED ITEM Take 150 mg by mouth at bedtime. Bio identical Progesterone 150 ([facility])    . Omega-3 Fatty Acids (FISH OIL) 1200 MG CAPS Take 2,400 mg by mouth every evening.    Marland Kitchen SYNTHROID 112 MCG tablet TAKE 1 TABLET DAILY BEFORE BREAKFAST 90 tablet 3  . Vitamin D, Ergocalciferol, (DRISDOL) 1.25 MG (50000 UNIT) CAPS capsule TAKE 1 CAPSULE BY MOUTH EVERY 7 DAYS. 12  capsule 1  . escitalopram (LEXAPRO) 10 MG tablet TAKE 1 TABLET BY MOUTH AT BEDTIME. NEED OFFICE VISIT WITH NEW PROVIDER AT OFFICE FOR REFILLS. 90 tablet 0  . gabapentin (NEURONTIN) 300 MG capsule Take 300 mg by mouth at bedtime as needed.    . hydroxychloroquine (PLAQUENIL) 200 MG tablet TAKE 1 TABLET BY MOUTH TWICE A DAY MONDAY THROUGH  FRIDAY 120 tablet 0  . MITIGARE 0.6 MG CAPS TAKE 1 CAPSULE BY MOUTH DAILY. (Patient not taking: Reported on 06/06/2020) 90 capsule 0  . azithromycin (ZITHROMAX) 250 MG tablet Take 2 tablets by mouth on day 1. Take 1 tablet by mouth x 4 days. (Patient not taking: Reported on 06/06/2020) 6 tablet 0  . benzonatate (TESSALON) 100 MG capsule Take 1 capsule (100 mg total) by mouth 3 (three) times daily as needed for cough. (Patient not taking: Reported on 06/06/2020) 30 capsule 0  . nitrofurantoin, macrocrystal-monohydrate, (MACROBID) 100 MG capsule Take by mouth daily as needed. UTI prevention (Patient not taking: Reported on 06/06/2020)     No facility-administered medications prior to visit.    Allergies  Allergen Reactions  . Penicillins Hives and Itching    Has patient had a PCN reaction causing immediate rash, facial/tongue/throat swelling, SOB or lightheadedness with hypotension: No Has patient had a PCN reaction causing severe rash involving mucus membranes or skin necrosis: No Has patient had a PCN reaction that required hospitalization: No Has patient had a PCN reaction occurring within the last 10 years: No If all of the above answers are "NO", then may proceed with Cephalosporin use.   . Sulfa Antibiotics Hives and Itching    Bactrim    ROS Review of Systems A fourteen system review of systems was performed and found to be positive as per HPI.   Objective:    Physical Exam General:  Well Developed, well nourished, appropriate for stated age.  Neuro:  Alert and oriented,  extra-ocular muscles intact  HEENT:  Normocephalic, atraumatic, neck supple Skin:   no gross rash, warm, pink. Cardiac:  RRR, S1 S2 Respiratory:  ECTA B/L w/o wheezing, crackles or rales, Not using accessory muscles, speaking in full sentences- unlabored. Vascular:  Ext warm, no cyanosis apprec.; cap RF less 2 sec. Psych:  No HI/SI, judgement and insight good, Euthymic mood. Full Affect.   BP 133/76   Pulse 84   Temp 98.1 F (36.7 C)   Ht 5\' 6"  (1.676 m)   Wt 196 lb 14.4 oz (89.3 kg)   SpO2 95%   BMI 31.78 kg/m  Wt Readings from Last 3 Encounters:  06/06/20 196 lb 14.4 oz (89.3 kg)  03/09/20 198 lb 12.8 oz (90.2 kg)  01/31/20 189 lb (85.7 kg)     Health Maintenance Due  Topic Date Due  . COVID-19 Vaccine (1) Never done  . Pneumococcal Vaccine 66-87 Years old (1 of 4 - PCV13) Never done  . Zoster Vaccines- Shingrix (1 of 2) Never done  . PAP SMEAR-Modifier  10/23/2019    There are no preventive care reminders to display for this patient.  Lab Results  Component Value Date   TSH 0.44 03/09/2020   Lab Results  Component Value Date   WBC 9.3 10/13/2019   HGB 15.1 (H) 10/13/2019   HCT 44.1 10/13/2019   MCV 95.2 10/13/2019   PLT 239 10/13/2019   Lab Results  Component Value Date   NA 136 10/13/2019   K 3.7 10/13/2019   CO2 25 10/13/2019   GLUCOSE 100 (H) 10/13/2019   BUN 19 10/13/2019   CREATININE 0.45 10/13/2019   BILITOT 0.3 08/17/2019   ALKPHOS 92 08/17/2019   AST 27 08/17/2019   ALT 25 08/17/2019   PROT 6.9 08/17/2019   ALBUMIN 4.7 08/17/2019   CALCIUM 9.6 10/13/2019   ANIONGAP 10 10/13/2019   Lab Results  Component Value Date   CHOL 236 (  H) 08/17/2019   Lab Results  Component Value Date   HDL 93 08/17/2019   Lab Results  Component Value Date   LDLCALC 120 (H) 08/17/2019   Lab Results  Component Value Date   TRIG 138 08/17/2019   Lab Results  Component Value Date   CHOLHDL 2.5 08/17/2019   Lab Results  Component Value Date   HGBA1C 5.5 08/17/2019      Assessment & Plan:   Problem List Items Addressed This Visit       Cardiovascular and Mediastinum   HTN (hypertension) - Primary (Chronic)     Other   Adjustment disorder with mixed anxiety and depressed mood (Chronic)   Relevant Medications   escitalopram (LEXAPRO) 20 MG tablet   Dysuria   Relevant Orders   POCT urinalysis dipstick (Completed)   Urine Culture (Completed)   Primary insomnia    Other Visit Diagnoses    Urinary frequency       Relevant Orders   POCT urinalysis dipstick (Completed)   Urine Culture (Completed)     HTN: -BP today fairly stable. -Recommend to continue monitoring BP at home. -Follow low sodium diet and continue good hydration. -Will continue to monitor.  Adjustment disorder with mixed anxiety and depressed mood: -PHQ-9 score of 6, GAD-7 score of 6. -Discussed with patient medication adjustments by increasing Lexapro to 20 mg to improve mood/anxiety. Patient verbalized understanding and agreeable. -Follow up in 6 weeks to reassess symptoms and medication therapy.   Primary insomnia: -Patient on multiple sleep aids with mild improvement. Recommend increasing Lexapro to improve anxiety which could possibly help with improving sleep. Recommend to hold Lunesta. Continue with melatonin and recommend trial of time release form. -Good sleep hygiene discussed. -If symptoms fail to improve or worsen recommend referral to sleep center.  Urinary frequency, dysuria: -Will collect UA and send for urine culture to evaluate for UTI. -Recommend adequate hydration.  Meds ordered this encounter  Medications  . escitalopram (LEXAPRO) 20 MG tablet    Sig: Take 1 tablet (20 mg total) by mouth daily.    Dispense:  90 tablet    Refill:  0    Order Specific Question:   Supervising Provider    Answer:   Nani Gasser D [[id number]]    Follow-up: Return in about 6 weeks (around 07/18/2020) for Mood, Insomnia and FBW 1 wk prior .    Mayer Masker, PA-C

## 2020-06-07 NOTE — Telephone Encounter (Signed)
Patient states she has had the PLQ on hold and has not restarted it. Patient states she does not need a refill at this time. Patient has an appointment on 06/22/2020.

## 2020-06-08 LAB — URINE CULTURE

## 2020-06-08 NOTE — Progress Notes (Deleted)
Office Visit Note  Patient: Marissa Larson             Date of Birth: 06-05-1955           MRN: [id number]             PCP: Mayer Masker, PA-C Referring: Mayer Masker, PA-C Visit Date: 06/22/2020 Occupation: @GUAROCC @  Subjective:  No chief complaint on file.   History of Present Illness: Marissa Larson is a 65 y.o. female ***   Activities of Daily Living:  Patient reports morning stiffness for *** {minute/hour:19697}.   Patient {ACTIONS;DENIES/REPORTS:21021675::"Denies"} nocturnal pain.  Difficulty dressing/grooming: {ACTIONS;DENIES/REPORTS:21021675::"Denies"} Difficulty climbing stairs: {ACTIONS;DENIES/REPORTS:21021675::"Denies"} Difficulty getting out of chair: {ACTIONS;DENIES/REPORTS:21021675::"Denies"} Difficulty using hands for taps, buttons, cutlery, and/or writing: {ACTIONS;DENIES/REPORTS:21021675::"Denies"}  No Rheumatology ROS completed.   PMFS History:  Patient Active Problem List   Diagnosis Date Noted  . Elevated LDL cholesterol level 07/19/2018  . Prediabetes 07/19/2018  . Paresthesia 12/09/2017  . Chronic insomnia 11/24/2017  . Insomnia secondary to chronic pain 11/24/2017  . Complaint related to dreams 11/24/2017  . 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 Neuropathy 08/18/2017  . 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 Obesity, Class I, BMI 30-34.9 12/03/2016  . Kidney stones 09/12/2016  . Nausea 09/12/2016  . High risk medication use 07/30/2016  . NAFLD (nonalcoholic fatty liver disease) 08/01/2016  . Primary osteoarthritis of both feet 04/12/2016  . History of kidney stones 04/12/2016  . Primary osteoarthritis of both hands 04/11/2016  . Bilateral primary osteoarthritis of hip 04/11/2016  . Primary osteoarthritis of both knees 04/11/2016  . Spondylosis of lumbar region without myelopathy or radiculopathy 04/11/2016  . Hyperuricemia 04/11/2016   . Primary insomnia 04/11/2016  . Flank pain 04/02/2016  . Sinusitis 04/02/2016  . Elevated HDL 11/11/2015  . Elevated LFTs 11/11/2015  . Adjustment disorder with mixed anxiety and depressed mood 11/11/2015  . Dysuria 11/11/2015  . Hypothyroidism 10/14/2015  . Pseudogout involving multiple joints 10/14/2015  . Vitamin D deficiency 10/14/2015  . Hormone replacement therapy- per GYN 10/14/2015  . HTN (hypertension) 10/14/2015  . Fatigue 10/14/2015  . h/o Hiatal hernia 10/14/2015  . Sleep difficulties 10/14/2015  . Generalized OA 10/14/2015  . Counseling on health promotion and disease prevention 10/14/2015  . 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 Age-related nuclear cataract of both eyes 01/25/2015  . Posterior vitreous detachment of left eye 01/25/2015  . chronic Palpitations 07/19/2013    Past Medical History:  Diagnosis Date  . Arthritis   . Chest pain 06/2013   Left sided  . Chronic leg pain 06/2013  . Complication of anesthesia   . Dysrhythmia   . Fatigue   . GERD (gastroesophageal reflux disease)   . History of kidney stones   . Hypertension   . Hypothyroidism   . Nonproductive cough 06/2013  . Overweight 01/18/2014  . Peripheral neuropathy   . PONV (postoperative nausea and vomiting)   . Sleep difficulties    Change in sleep patterns  . SOB (shortness of breath) 06/2013  . Tachycardia 06/2013  . Thyroid disease    hypo  . UTI (lower urinary tract infection)   . Vesico-ureteric reflux     Family History  Problem Relation Age of Onset  . Migraines Other        Fam Hx   . Heart disease Other        Fam  Hx  . Thyroid disease Other        Fam Hx - She also has thyroid disease  . Diabetes Other        Fam Hx  . Arthritis Other        Fam Hx  . Gallbladder disease Other        Fam Hx  . Cancer Other        Fam Hx - Pancreatic  . Hypertension Other        Fam Hx  . Heart attack Cousin        Multiple cousins with MI's before 21 years of age  . Heart  disease Mother 98  . Hyperlipidemia Mother 66  . Hypertension Mother 58  . Stroke Mother 48  . Heart disease Maternal Grandmother   . Cancer Father 5       pancreatic  . Diabetes Brother 50  . Heart attack Maternal Uncle   . Heart attack Paternal Aunt    Past Surgical History:  Procedure Laterality Date  . ABDOMINAL SURGERY  1994  . CYSTOSCOPY/URETEROSCOPY/HOLMIUM LASER/STENT PLACEMENT Left 09/22/2017   Procedure: CYSTOSCOPY/RETROGRADE/URETEROSCOPY/ BASKET STONE EXTRACTION/STENT PLACEMENT;  Surgeon: Rene Paci, MD;  Location: [facility];  Service: Urology;  Laterality: Left;  ONLY NEEDS 60 MIN  . FOOT SURGERY Right 2009  . KIDNEY SURGERY Left 1993  . KNEE ARTHROPLASTY Right 11/02/2019  . KNEE SURGERY     Multiple knee surgeries  . OOPHORECTOMY Left 1994  . 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 Thurston Hole  . small fiber neuropathy biopsy  12/2017  . TONSILLECTOMY    . URETEROSCOPY VIA URETEROSTOMY Left 1994   Social History   Social History Narrative  . Not on file   Immunization History  Administered Date(s) Administered  . Tdap 01/07/2011     Objective: Vital Signs: There were no vitals taken for this visit.   Physical Exam   Musculoskeletal Exam: ***  CDAI Exam: CDAI Score: -- Patient Global: --; Provider Global: -- Swollen: --; Tender: -- Joint Exam 06/22/2020   No joint exam has been documented for this visit   There is currently no information documented on the homunculus. Go to the Rheumatology activity and complete the homunculus joint exam.  Investigation: No additional findings.  Imaging: No results found.  Recent Labs: Lab Results  Component Value Date   WBC 9.3 10/13/2019   HGB 15.1 (H) 10/13/2019   PLT 239 10/13/2019   NA 136 10/13/2019   K 3.7 10/13/2019   CL 101 10/13/2019   CO2 25 10/13/2019   GLUCOSE 100 (H) 10/13/2019   BUN 19 10/13/2019   CREATININE 0.45 10/13/2019   BILITOT 0.3 08/17/2019   ALKPHOS 92  08/17/2019   AST 27 08/17/2019   ALT 25 08/17/2019   PROT 6.9 08/17/2019   ALBUMIN 4.7 08/17/2019   CALCIUM 9.6 10/13/2019   GFRAA 108 08/17/2019   QFTBGOLDPLUS NEGATIVE 02/16/2017    Speciality Comments: PLQ eye exam: 04/20/2019 normal. Dr. Burgess Estelle. Follow up in 1 year.  Procedures:  No procedures performed Allergies: Penicillins and Sulfa antibiotics   Assessment / Plan:     Visit Diagnoses: Inflammatory arthritis  High risk medication use  Pseudogout involving multiple joints  Primary osteoarthritis of both hands  Bilateral primary osteoarthritis of hip  S/P total knee arthroplasty, right  S/P total knee replacement, left  Primary osteoarthritis of both feet  Spondylosis of lumbar region without myelopathy or radiculopathy  Hyperuricemia  NAFLD (nonalcoholic  fatty liver disease)  Essential hypertension  Small fiber neuropathy  Primary insomnia  History of kidney stones  h/o Hiatal hernia  Vitamin D deficiency  Orders: No orders of the defined types were placed in this encounter.  No orders of the defined types were placed in this encounter.   Face-to-face time spent with patient was *** minutes. Greater than 50% of time was spent in counseling and coordination of care.  Follow-Up Instructions: No follow-ups on file.   Gearldine Bienenstock, PA-C  Note - This record has been created using Dragon software.  Chart creation errors have been sought, but may not always  have been located. Such creation errors do not reflect on  the standard of medical care.

## 2020-06-16 ENCOUNTER — Other Ambulatory Visit: Payer: Self-pay | Admitting: Physician Assistant

## 2020-06-22 ENCOUNTER — Ambulatory Visit: Payer: BC Managed Care – PPO | Admitting: Physician Assistant

## 2020-06-22 DIAGNOSIS — M160 Bilateral primary osteoarthritis of hip: Secondary | ICD-10-CM

## 2020-06-22 DIAGNOSIS — M19071 Primary osteoarthritis, right ankle and foot: Secondary | ICD-10-CM

## 2020-06-22 DIAGNOSIS — M19041 Primary osteoarthritis, right hand: Secondary | ICD-10-CM

## 2020-06-22 DIAGNOSIS — K449 Diaphragmatic hernia without obstruction or gangrene: Secondary | ICD-10-CM

## 2020-06-22 DIAGNOSIS — M1990 Unspecified osteoarthritis, unspecified site: Secondary | ICD-10-CM

## 2020-06-22 DIAGNOSIS — E790 Hyperuricemia without signs of inflammatory arthritis and tophaceous disease: Secondary | ICD-10-CM

## 2020-06-22 DIAGNOSIS — Z87442 Personal history of urinary calculi: Secondary | ICD-10-CM

## 2020-06-22 DIAGNOSIS — E559 Vitamin D deficiency, unspecified: Secondary | ICD-10-CM

## 2020-06-22 DIAGNOSIS — F5101 Primary insomnia: Secondary | ICD-10-CM

## 2020-06-22 DIAGNOSIS — M1189 Other specified crystal arthropathies, multiple sites: Secondary | ICD-10-CM

## 2020-06-22 DIAGNOSIS — K760 Fatty (change of) liver, not elsewhere classified: Secondary | ICD-10-CM

## 2020-06-22 DIAGNOSIS — Z79899 Other long term (current) drug therapy: Secondary | ICD-10-CM

## 2020-06-22 DIAGNOSIS — Z96651 Presence of right artificial knee joint: Secondary | ICD-10-CM

## 2020-06-22 DIAGNOSIS — M47816 Spondylosis without myelopathy or radiculopathy, lumbar region: Secondary | ICD-10-CM

## 2020-06-22 DIAGNOSIS — G629 Polyneuropathy, unspecified: Secondary | ICD-10-CM

## 2020-06-22 DIAGNOSIS — Z96652 Presence of left artificial knee joint: Secondary | ICD-10-CM

## 2020-06-22 DIAGNOSIS — I10 Essential (primary) hypertension: Secondary | ICD-10-CM

## 2020-06-22 NOTE — Progress Notes (Deleted)
Office Visit Note  Patient: Marissa Larson             Date of Birth: 21-Sep-1955           MRN: [id number]             PCP: Marissa Masker, PA-C Referring: Marissa Masker, PA-C Visit Date: 06/28/2020 Occupation: @GUAROCC @  Subjective:  No chief complaint on file.   History of Present Illness: Marissa Larson is a 65 y.o. female ***   Activities of Daily Living:  Patient reports morning stiffness for *** {minute/hour:19697}.   Patient {ACTIONS;DENIES/REPORTS:21021675::"Denies"} nocturnal pain.  Difficulty dressing/grooming: {ACTIONS;DENIES/REPORTS:21021675::"Denies"} Difficulty climbing stairs: {ACTIONS;DENIES/REPORTS:21021675::"Denies"} Difficulty getting out of chair: {ACTIONS;DENIES/REPORTS:21021675::"Denies"} Difficulty using hands for taps, buttons, cutlery, and/or writing: {ACTIONS;DENIES/REPORTS:21021675::"Denies"}  No Rheumatology ROS completed.   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   Obesity, Class I, BMI 30-34.9 12/03/2016   Kidney stones 09/12/2016   Nausea 09/12/2016   High risk medication use 07/30/2016   NAFLD (nonalcoholic fatty liver disease) 08/01/2016   Primary osteoarthritis of both feet 04/12/2016   History of kidney stones 04/12/2016   Primary osteoarthritis of both hands 04/11/2016   Bilateral primary osteoarthritis of hip 04/11/2016   Primary osteoarthritis of both knees 04/11/2016   Spondylosis of lumbar region without myelopathy or radiculopathy 04/11/2016   Hyperuricemia 04/11/2016   Primary insomnia  04/11/2016   Flank pain 04/02/2016   Sinusitis 04/02/2016   Elevated HDL 11/11/2015   Elevated LFTs 11/11/2015   Adjustment disorder with mixed anxiety and depressed mood 11/11/2015   Dysuria 11/11/2015   Hypothyroidism 10/14/2015   Pseudogout involving multiple joints 10/14/2015   Vitamin D deficiency 10/14/2015   Hormone replacement therapy- per GYN 10/14/2015   HTN (hypertension) 10/14/2015   Fatigue 10/14/2015   h/o Hiatal hernia 10/14/2015   Sleep difficulties 10/14/2015   Generalized OA 10/14/2015   Counseling on health promotion and disease prevention 10/14/2015   Gastric ulcer- due to Mobic 10/09/2015   Age-related nuclear cataract of both eyes 01/25/2015   Posterior vitreous detachment of left eye 01/25/2015   chronic Palpitations 07/19/2013    Past Medical History:  Diagnosis Date   Arthritis    Chest pain 06/2013   Left sided   Chronic leg pain 06/2013   Complication of anesthesia    Dysrhythmia    Fatigue    GERD (gastroesophageal reflux disease)    History of kidney stones    Hypertension    Hypothyroidism    Nonproductive cough 06/2013   Overweight 01/18/2014   Peripheral neuropathy    PONV (postoperative nausea and vomiting)    Sleep difficulties    Change in sleep patterns   SOB (shortness of breath) 06/2013   Tachycardia 06/2013   Thyroid disease    hypo   UTI (lower urinary tract infection)    Vesico-ureteric reflux     Family History  Problem Relation Age of Onset   Migraines Other        Fam Hx    Heart disease Other        Fam  Hx   Thyroid disease Other        Fam Hx - She also has thyroid disease   Diabetes Other        Fam Hx   Arthritis Other        Fam Hx   Gallbladder disease Other        Fam Hx   Cancer Other        Fam Hx - Pancreatic   Hypertension Other        Fam Hx   Heart attack Cousin        Multiple cousins with MI's before 54 years of age   Heart disease Mother 24   Hyperlipidemia Mother 12   Hypertension Mother 46    Stroke Mother 82   Heart disease Maternal Grandmother    Cancer Father 81       pancreatic   Diabetes Brother 50   Heart attack Maternal Uncle    Heart attack Paternal Aunt    Past Surgical History:  Procedure Laterality Date   ABDOMINAL SURGERY  1994   CYSTOSCOPY/URETEROSCOPY/HOLMIUM LASER/STENT PLACEMENT Left 09/22/2017   Procedure: CYSTOSCOPY/RETROGRADE/URETEROSCOPY/ BASKET STONE EXTRACTION/STENT PLACEMENT;  Surgeon: Rene Paci, MD;  Location: [facility];  Service: Urology;  Laterality: Left;  ONLY NEEDS 60 MIN   FOOT SURGERY Right 2009   KIDNEY SURGERY Left 1993   KNEE ARTHROPLASTY Right 11/02/2019   KNEE SURGERY     Multiple knee surgeries   OOPHORECTOMY Left 1994   REPLACEMENT TOTAL KNEE Left 10/13/2018   Dr. Thurston Hole   small fiber neuropathy biopsy  12/2017   TONSILLECTOMY     URETEROSCOPY VIA URETEROSTOMY Left 1994   Social History   Social History Narrative   Not on file   Immunization History  Administered Date(s) Administered   Tdap 01/07/2011     Objective: Vital Signs: There were no vitals taken for this visit.   Physical Exam   Musculoskeletal Exam:   CDAI Exam: CDAI Score: -- Patient Global: --; Provider Global: -- Swollen: --; Tender: -- Joint Exam 06/28/2020   No joint exam has been documented for this visit   There is currently no information documented on the homunculus. Go to the Rheumatology activity and complete the homunculus joint exam.  Investigation: No additional findings.  Imaging: No results found.  Recent Labs: Lab Results  Component Value Date   WBC 9.3 10/13/2019   HGB 15.1 (H) 10/13/2019   PLT 239 10/13/2019   NA 136 10/13/2019   K 3.7 10/13/2019   CL 101 10/13/2019   CO2 25 10/13/2019   GLUCOSE 100 (H) 10/13/2019   BUN 19 10/13/2019   CREATININE 0.45 10/13/2019   BILITOT 0.3 08/17/2019   ALKPHOS 92 08/17/2019   AST 27 08/17/2019   ALT 25 08/17/2019   PROT 6.9 08/17/2019   ALBUMIN 4.7 08/17/2019    CALCIUM 9.6 10/13/2019   GFRAA 108 08/17/2019   QFTBGOLDPLUS NEGATIVE 02/16/2017    Speciality Comments: PLQ eye exam: 04/20/2019 normal. Dr. Burgess Estelle. Follow up in 1 year.  Procedures:  No procedures performed Allergies: Penicillins and Sulfa antibiotics   Assessment / Plan:     Visit Diagnoses: No diagnosis found.  Orders: No orders of the defined types were placed in this encounter.  No orders of the defined types were placed in this encounter.   Face-to-face time spent with patient was *** minutes. Greater than 50% of time was spent in counseling and coordination of care.  Follow-Up Instructions: No follow-ups  on file.   Earnestine Mealing, CMA  Note - This record has been created using Editor, commissioning.  Chart creation errors have been sought, but may not always  have been located. Such creation errors do not reflect on  the standard of medical care.

## 2020-06-25 NOTE — Progress Notes (Signed)
Office Visit Note  Patient: Marissa Larson             Date of Birth: 11/11/1955           MRN: [id number]             PCP: Mayer Masker, PA-C Referring: Mayer Masker, PA-C Visit Date: 07/04/2020 Occupation: @GUAROCC @  Subjective:  Pain in both wrist joints  History of Present Illness: Marissa Larson is a 65 y.o. female with history of inflammatory arthritis and pseudogout.  Patient was previously taking Plaquenil 200 mg 1 tablet by mouth twice daily Monday through Friday but discontinued in October 2021 prior to having her knee replaced.  She states she also discontinued taking Medicare around that time.  She has not restarted Plaquenil or Medicare.  She reports that she started taking Celebrex and gabapentin postoperatively but developed GI side effects while taking Celebrex and discontinued.  She recently has tried taking meloxicam which she has been tolerating better.  She has been experiencing increased pain and stiffness on a daily basis.  Her discomfort is most severe in both wrist joints and she notices occasional joint swelling.  She has ongoing lower back pain but denies any symptoms of radiculopathy.  She has occasional pain in both feet but denies any joint swelling.  She states both knee replacements are doing well.  She continues to take turmeric, tart cherry, ginger, and omega-3 on a daily basis which she has found to be helpful at alleviating some of her symptoms.   Activities of Daily Living:  Patient reports morning stiffness for 10-15 minutes.   Patient Reports nocturnal pain.  Difficulty dressing/grooming: Denies Difficulty climbing stairs: Reports Difficulty getting out of chair: Reports Difficulty using hands for taps, buttons, cutlery, and/or writing: Reports  Review of Systems  Constitutional:  Positive for fatigue.  HENT:  Negative for mouth sores, mouth dryness and nose dryness.   Eyes:  Negative for pain, itching and dryness.  Respiratory:  Negative for  shortness of breath and difficulty breathing.   Cardiovascular:  Negative for chest pain and palpitations.  Gastrointestinal:  Positive for diarrhea. Negative for blood in stool and constipation.  Endocrine: Negative for increased urination.  Genitourinary:  Negative for difficulty urinating.  Musculoskeletal:  Positive for joint pain, joint pain, joint swelling, myalgias, morning stiffness, muscle tenderness and myalgias.  Skin:  Negative for color change, rash and redness.  Allergic/Immunologic: Negative for susceptible to infections.  Neurological:  Negative for dizziness, numbness, headaches, memory loss and weakness.  Hematological:  Negative for bruising/bleeding tendency.  Psychiatric/Behavioral:  Negative for confusion.    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   Obesity, Class I, BMI 30-34.9 12/03/2016   Kidney stones 09/12/2016   Nausea 09/12/2016   High risk medication use 07/30/2016   NAFLD (nonalcoholic fatty liver disease) 08/01/2016   Primary osteoarthritis of both feet 04/12/2016   History of kidney stones 04/12/2016   Primary osteoarthritis of both hands 04/11/2016   Bilateral primary osteoarthritis of hip 04/11/2016   Primary osteoarthritis of both knees  04/11/2016   Spondylosis of lumbar region without myelopathy or radiculopathy 04/11/2016   Hyperuricemia 04/11/2016   Primary insomnia 04/11/2016   Flank pain 04/02/2016   Sinusitis 04/02/2016   Elevated HDL 11/11/2015   Elevated LFTs 11/11/2015   Adjustment disorder with  mixed anxiety and depressed mood 11/11/2015   Dysuria 11/11/2015   Hypothyroidism 10/14/2015   Pseudogout involving multiple joints 10/14/2015   Vitamin D deficiency 10/14/2015   Hormone replacement therapy- per GYN 10/14/2015   HTN (hypertension) 10/14/2015   Fatigue 10/14/2015   h/o Hiatal hernia 10/14/2015   Sleep difficulties 10/14/2015   Generalized OA 10/14/2015   Counseling on health promotion and disease prevention 10/14/2015   Gastric ulcer- due to Mobic 10/09/2015   Age-related nuclear cataract of both eyes 01/25/2015   Posterior vitreous detachment of left eye 01/25/2015   chronic Palpitations 07/19/2013    Past Medical History:  Diagnosis Date   Arthritis    Chest pain 06/2013   Left sided   Chronic leg pain 06/2013   Complication of anesthesia    Dysrhythmia    Fatigue    GERD (gastroesophageal reflux disease)    History of kidney stones    Hypertension    Hypothyroidism    Nonproductive cough 06/2013   Overweight 01/18/2014   Peripheral neuropathy    PONV (postoperative nausea and vomiting)    Sleep difficulties    Change in sleep patterns   SOB (shortness of breath) 06/2013   Tachycardia 06/2013   Thyroid disease    hypo   UTI (lower urinary tract infection)    Vesico-ureteric reflux     Family History  Problem Relation Age of Onset   Migraines Other        Fam Hx    Heart disease Other        Fam Hx   Thyroid disease Other        Fam Hx - She also has thyroid disease   Diabetes Other        Fam Hx   Arthritis Other        Fam Hx   Gallbladder disease Other        Fam Hx   Cancer Other        Fam Hx - Pancreatic   Hypertension Other        Fam Hx   Heart attack Cousin        Multiple cousins with MI's before 49 years of age   Heart disease Mother 76   Hyperlipidemia Mother 63   Hypertension Mother 54   Stroke Mother 63   Heart disease Maternal Grandmother    Cancer Father 71       pancreatic   Diabetes Brother 50   Heart attack Maternal  Uncle    Heart attack Paternal Aunt    Past Surgical History:  Procedure Laterality Date   ABDOMINAL SURGERY  1994   CYSTOSCOPY/URETEROSCOPY/HOLMIUM LASER/STENT PLACEMENT Left 09/22/2017   Procedure: CYSTOSCOPY/RETROGRADE/URETEROSCOPY/ BASKET STONE EXTRACTION/STENT PLACEMENT;  Surgeon: Rene Paci, MD;  Location: [facility];  Service: Urology;  Laterality: Left;  ONLY NEEDS 60 MIN   FOOT SURGERY Right 2009   KIDNEY SURGERY Left 1993   KNEE ARTHROPLASTY Right 11/02/2019   KNEE SURGERY     Multiple knee surgeries   OOPHORECTOMY Left 1994   REPLACEMENT TOTAL KNEE Left 10/13/2018   Dr. Thurston Hole   small fiber neuropathy biopsy  12/2017   TONSILLECTOMY     URETEROSCOPY VIA URETEROSTOMY Left 1994  Social History   Social History Narrative   Not on file   Immunization History  Administered Date(s) Administered   Tdap 01/07/2011     Objective: Vital Signs: BP (!) 144/86 (BP Location: Left Arm, Patient Position: Sitting, Cuff Size: Normal)   Pulse 86   Resp 16   Ht 5\' 6"  (1.676 m)   Wt 196 lb 12.8 oz (89.3 kg)   BMI 31.76 kg/m    Physical Exam Vitals and nursing note reviewed.  Constitutional:      Appearance: She is well-developed.  HENT:     Head: Normocephalic and atraumatic.  Eyes:     Conjunctiva/sclera: Conjunctivae normal.  Pulmonary:     Effort: Pulmonary effort is normal.  Abdominal:     Palpations: Abdomen is soft.  Musculoskeletal:     Cervical back: Normal range of motion.  Skin:    General: Skin is warm and dry.     Capillary Refill: Capillary refill takes less than 2 seconds.  Neurological:     Mental Status: She is alert and oriented to person, place, and time.  Psychiatric:        Behavior: Behavior normal.     Musculoskeletal Exam: C-spine has good range of motion with no discomfort.  Tenderness over both SI joints noted.  No midline spinal tenderness noted.  Shoulder joints and elbow joints have good range of motion with no discomfort.   Painful range of motion of both wrist joints with synovial thickening over the ulnar aspect of both wrists.  Tenderness over both wrists noted.  No tenderness or synovitis over MCP joints.  Complete fist formation bilaterally.  PIP and DIP thickening consistent with osteoarthritis of both hands noted.  Hip joints have limited range of motion bilaterally.  Bilateral knee replacements have good range of motion with no discomfort.  Ankle joints have good range of motion with no tenderness or joint swelling.  No tenderness over MTP joints.  Osteoarthritic changes noted in both feet.  CDAI Exam: CDAI Score: 1  Patient Global: 5 mm; Provider Global: 5 mm Swollen: 0 ; Tender: 0  Joint Exam 07/04/2020   No joint exam has been documented for this visit   There is currently no information documented on the homunculus. Go to the Rheumatology activity and complete the homunculus joint exam.  Investigation: No additional findings.  Imaging: No results found.  Recent Labs: Lab Results  Component Value Date   WBC 9.3 10/13/2019   HGB 15.1 (H) 10/13/2019   PLT 239 10/13/2019   NA 136 10/13/2019   K 3.7 10/13/2019   CL 101 10/13/2019   CO2 25 10/13/2019   GLUCOSE 100 (H) 10/13/2019   BUN 19 10/13/2019   CREATININE 0.45 10/13/2019   BILITOT 0.3 08/17/2019   ALKPHOS 92 08/17/2019   AST 27 08/17/2019   ALT 25 08/17/2019   PROT 6.9 08/17/2019   ALBUMIN 4.7 08/17/2019   CALCIUM 9.6 10/13/2019   GFRAA 108 08/17/2019   QFTBGOLDPLUS NEGATIVE 02/16/2017    Speciality Comments: PLQ eye exam: 04/20/2019 normal. Dr. Burgess Estelleanner. Follow up in 1 year.  Procedures:  No procedures performed Allergies: Penicillins and Sulfa antibiotics   Assessment / Plan:     Visit Diagnoses: Inflammatory arthritis: She continues to experience chronic pain and intermittent inflammation in both wrist joints.  On examination today she has tenderness and synovial thickening over the ulnar aspect of both wrists.  She  discontinued Plaquenil in October 2021 prior to having her right knee replaced  and has not restarted on Plaquenil.  She is apprehensive to restart due to being on so many medications.  She tried taking Celebrex but developed GI side effects and discontinued.  She has recently restarted taking an old prescription of meloxicam but is unsure of the dose.  She has been tolerating meloxicam better and finds it to be effective at managing most of her arthralgias and joint stiffness.  Her new prescription for meloxicam 7.5 mg 1 to 2 tablets by mouth daily as needed for pain relief is sent to the pharmacy.  Dicussed the risks of NSAID use. We discussed the importance of restarting on Plaquenil to try to prevent recurrent flares as well as slow the disease progression.  Indications, contraindications, and potential side effects of Plaquenil were discussed again today in detail.  All questions were addressed.  She will restart on Plaquenil 200 mg 1 tablet by mouth twice daily Monday through Friday.  She was advised to notify us if she cannot tolerate taking Plaquenil.  She will follow-up in the office in 3 to 4 months to assess her response.  Patient was counseled on the purpose, proper use, and adverse effects of hydroxychloroquine including nausea/diarrhea, skin rash, headaches, and sun sensitivity.  Discussed importance of annual eye exams while on hydroxychloroquine to monitor to ocular toxicity and discussed importance of frequent laboratory monitoring.  Provided patient with eye exam form for baseline ophthalmologic exam.  Provided patient with educational materials on hydroxychloroquine and answered all questions.  Patient consented to hydroxychloroquine.  Will upload consent in the media tab.    Dose will be Plaquenil 200 mg twice daily Monday through Friday.  Prescription pending lab results.  High risk medication use - She we will be restarting on Plaquenil 200 mg 1 tablet by mouth twice daily Monday through  Friday.  PLQ eye exam: 04/20/2019.  She was advised to schedule another Plaquenil eye exam with her ophthalmologist.  She had lab work drawn on 07/03/2020 which is available in care everywhere and was reviewed today in the office.  She will return for lab work in 1 month and every 5 months to monitor for toxicity.  Standing orders for CBC and CMP remain in place.  Pseudogout involving multiple joints: She discontinued colchicine in October 2021.  She could not tolerate colchicine in the past and does not want to restart at this time.  She would like to continue on meloxicam.  A new prescription for meloxicam 7.5 mg 1 to 2 tablets by mouth daily as needed for pain relief was sent to the pharmacy.  She will require lab monitoring as discussed above.  Primary osteoarthritis of both hands: She has PIP and DIP thickening consistent with osteoarthritis of both hands.  CMC joint prominence noted.  Complete fist formation bilaterally.  She continues to have persistent pain and intermittent inflammation in both wrist joints especially on the ulnar aspect bilaterally.  She was strongly encouraged to continue to take natural anti-inflammatories including tart cherry, ginger, and omega-3.  We discussed reducing the dose of turmeric to see if it alleviates some of her symptoms of GI symptoms.   She cannot tolerate taking Celebrex due to GI side effects.  She has been taking meloxicam as needed for pain relief.  She is unsure of the dose she has been taking.  A new prescription for meloxicam 7.5 mg 1 to 2 tablets by mouth daily as needed for pain relief was sent to the pharmacy.  We discussed the importance  of taking meloxicam with food.  We also discussed the importance of close lab monitoring while taking meloxicam.  Bilateral primary osteoarthritis of hip: She has painful limited range of motion of both hip joints.  S/P total knee arthroplasty, right - Performed by Dr. Thurston Hole: Doing well.  She has good range of motion  with no discomfort at this time.  S/P total knee replacement, left - She had surgery by Dr. Thurston Hole in 2020.  Doing well.  She has good range of motion with no discomfort at this time.  Primary osteoarthritis of both feet: PIP and DIP thickening consistent with osteoarthritis of both feet noted.  She has not been experiencing any discomfort in her ankle joint or toes recently.  She has good range of motion of both ankle joints with no tenderness or inflammation.  She is wearing proper fitting shoes.  Spondylosis of lumbar region without myelopathy or radiculopathy: She has been experiencing increased discomfort in her lower back.  She has no symptoms of radiculopathy at this time.  We discussed a referral to physical therapy but she declined at this time.  We discussed using a foam roller as well as performing core strengthening exercises at home.  She plans on establishing care with a spine specialist at [facility] if her discomfort persists or worsens.  Hyperuricemia: Uric acid was 4.9 on 09/23/2018.  She has not had any signs or symptoms of a gout flare.  Other medical conditions are listed as follows:  Small fiber neuropathy  NAFLD (nonalcoholic fatty liver disease)  Essential hypertension  History of kidney stones  Primary insomnia  Vitamin D deficiency: She is taking vitamin D 2000 units daily.  h/o Hiatal hernia  Orders: No orders of the defined types were placed in this encounter.  No orders of the defined types were placed in this encounter.    Follow-Up Instructions: Return for Inflammatory arthritis .   Gearldine Bienenstock, PA-C  Note - This record has been created using Dragon software.  Chart creation errors have been sought, but may not always  have been located. Such creation errors do not reflect on  the standard of medical care.

## 2020-06-28 ENCOUNTER — Ambulatory Visit: Payer: BC Managed Care – PPO | Admitting: Physician Assistant

## 2020-07-03 DIAGNOSIS — R1010 Upper abdominal pain, unspecified: Secondary | ICD-10-CM | POA: Diagnosis not present

## 2020-07-03 DIAGNOSIS — R197 Diarrhea, unspecified: Secondary | ICD-10-CM | POA: Diagnosis not present

## 2020-07-04 ENCOUNTER — Other Ambulatory Visit: Payer: Self-pay

## 2020-07-04 ENCOUNTER — Ambulatory Visit (INDEPENDENT_AMBULATORY_CARE_PROVIDER_SITE_OTHER): Payer: BC Managed Care – PPO | Admitting: Physician Assistant

## 2020-07-04 ENCOUNTER — Encounter: Payer: Self-pay | Admitting: Physician Assistant

## 2020-07-04 VITALS — BP 144/86 | HR 86 | Resp 16 | Ht 66.0 in | Wt 196.8 lb

## 2020-07-04 DIAGNOSIS — E559 Vitamin D deficiency, unspecified: Secondary | ICD-10-CM

## 2020-07-04 DIAGNOSIS — Z87442 Personal history of urinary calculi: Secondary | ICD-10-CM

## 2020-07-04 DIAGNOSIS — M19072 Primary osteoarthritis, left ankle and foot: Secondary | ICD-10-CM

## 2020-07-04 DIAGNOSIS — Z96651 Presence of right artificial knee joint: Secondary | ICD-10-CM

## 2020-07-04 DIAGNOSIS — M19041 Primary osteoarthritis, right hand: Secondary | ICD-10-CM | POA: Diagnosis not present

## 2020-07-04 DIAGNOSIS — M19071 Primary osteoarthritis, right ankle and foot: Secondary | ICD-10-CM

## 2020-07-04 DIAGNOSIS — G629 Polyneuropathy, unspecified: Secondary | ICD-10-CM

## 2020-07-04 DIAGNOSIS — M1189 Other specified crystal arthropathies, multiple sites: Secondary | ICD-10-CM | POA: Diagnosis not present

## 2020-07-04 DIAGNOSIS — Z79899 Other long term (current) drug therapy: Secondary | ICD-10-CM | POA: Diagnosis not present

## 2020-07-04 DIAGNOSIS — M19042 Primary osteoarthritis, left hand: Secondary | ICD-10-CM

## 2020-07-04 DIAGNOSIS — M1990 Unspecified osteoarthritis, unspecified site: Secondary | ICD-10-CM | POA: Diagnosis not present

## 2020-07-04 DIAGNOSIS — Z01419 Encounter for gynecological examination (general) (routine) without abnormal findings: Secondary | ICD-10-CM | POA: Diagnosis not present

## 2020-07-04 DIAGNOSIS — K760 Fatty (change of) liver, not elsewhere classified: Secondary | ICD-10-CM

## 2020-07-04 DIAGNOSIS — K449 Diaphragmatic hernia without obstruction or gangrene: Secondary | ICD-10-CM

## 2020-07-04 DIAGNOSIS — Z96652 Presence of left artificial knee joint: Secondary | ICD-10-CM

## 2020-07-04 DIAGNOSIS — M160 Bilateral primary osteoarthritis of hip: Secondary | ICD-10-CM

## 2020-07-04 DIAGNOSIS — E790 Hyperuricemia without signs of inflammatory arthritis and tophaceous disease: Secondary | ICD-10-CM

## 2020-07-04 DIAGNOSIS — F5101 Primary insomnia: Secondary | ICD-10-CM

## 2020-07-04 DIAGNOSIS — M47816 Spondylosis without myelopathy or radiculopathy, lumbar region: Secondary | ICD-10-CM

## 2020-07-04 DIAGNOSIS — I10 Essential (primary) hypertension: Secondary | ICD-10-CM

## 2020-07-04 MED ORDER — MELOXICAM 7.5 MG PO TABS: 7.5000 mg | ORAL_TABLET | Freq: Two times a day (BID) | ORAL | 1 refills | Status: DC | PRN

## 2020-07-04 MED ORDER — HYDROXYCHLOROQUINE SULFATE 200 MG PO TABS: ORAL_TABLET | ORAL | 0 refills | Status: DC

## 2020-07-04 NOTE — Patient Instructions (Signed)
Standing Labs We placed an order today for your standing lab work.   Please have your standing labs drawn in 1 month and then every 5 months   If possible, please have your labs drawn 2 weeks prior to your appointment so that the provider can discuss your results at your appointment.  Please note that you may see your imaging and lab results in MyChart before we have reviewed them. We may be awaiting multiple results to interpret others before contacting you. Please allow our office up to 72 hours to thoroughly review all of the results before contacting the office for clarification of your results.  We have open lab daily: Monday through Thursday from 1:30-4:30 PM and Friday from 1:30-4:00 PM at the office of Dr. Pollyann Savoy, [facility].   Please be advised, all patients with office appointments requiring lab work will take precedent over walk-in lab work.  If possible, please come for your lab work on Monday and Friday afternoons, as you may experience shorter wait times. The office is located at [address], [address], [place], [place] [address] No appointment is necessary.   Labs are drawn by [facility]. Please bring your co-pay at the time of your lab draw.  You may receive a bill from [facility] for your lab work.  If you wish to have your labs drawn at another location, please call the office 24 hours in advance to send orders.  If you have any questions regarding directions or hours of operation,  please call [phone].   As a reminder, please drink plenty of water prior to coming for your lab work. Thanks!

## 2020-07-05 DIAGNOSIS — R197 Diarrhea, unspecified: Secondary | ICD-10-CM | POA: Diagnosis not present

## 2020-07-05 DIAGNOSIS — R1010 Upper abdominal pain, unspecified: Secondary | ICD-10-CM | POA: Diagnosis not present

## 2020-07-11 ENCOUNTER — Encounter: Payer: Self-pay | Admitting: Physician Assistant

## 2020-07-11 ENCOUNTER — Other Ambulatory Visit: Payer: Self-pay | Admitting: Physician Assistant

## 2020-07-11 DIAGNOSIS — R7303 Prediabetes: Secondary | ICD-10-CM

## 2020-07-11 DIAGNOSIS — R7989 Other specified abnormal findings of blood chemistry: Secondary | ICD-10-CM

## 2020-07-11 DIAGNOSIS — E559 Vitamin D deficiency, unspecified: Secondary | ICD-10-CM

## 2020-07-11 DIAGNOSIS — E039 Hypothyroidism, unspecified: Secondary | ICD-10-CM

## 2020-07-11 DIAGNOSIS — E7889 Other lipoprotein metabolism disorders: Secondary | ICD-10-CM

## 2020-07-11 DIAGNOSIS — I10 Essential (primary) hypertension: Secondary | ICD-10-CM

## 2020-07-13 ENCOUNTER — Other Ambulatory Visit: Payer: BC Managed Care – PPO

## 2020-07-20 ENCOUNTER — Ambulatory Visit: Payer: BC Managed Care – PPO | Admitting: Physician Assistant

## 2020-07-20 DIAGNOSIS — Z1211 Encounter for screening for malignant neoplasm of colon: Secondary | ICD-10-CM | POA: Diagnosis not present

## 2020-07-20 DIAGNOSIS — R1010 Upper abdominal pain, unspecified: Secondary | ICD-10-CM | POA: Diagnosis not present

## 2020-07-20 DIAGNOSIS — K633 Ulcer of intestine: Secondary | ICD-10-CM | POA: Diagnosis not present

## 2020-07-20 DIAGNOSIS — K5710 Diverticulosis of small intestine without perforation or abscess without bleeding: Secondary | ICD-10-CM | POA: Diagnosis not present

## 2020-07-20 DIAGNOSIS — R195 Other fecal abnormalities: Secondary | ICD-10-CM | POA: Diagnosis not present

## 2020-07-20 DIAGNOSIS — R1013 Epigastric pain: Secondary | ICD-10-CM | POA: Diagnosis not present

## 2020-07-20 DIAGNOSIS — K120 Recurrent oral aphthae: Secondary | ICD-10-CM | POA: Diagnosis not present

## 2020-08-03 DIAGNOSIS — R109 Unspecified abdominal pain: Secondary | ICD-10-CM | POA: Diagnosis not present

## 2020-08-03 DIAGNOSIS — I700 Atherosclerosis of aorta: Secondary | ICD-10-CM | POA: Diagnosis not present

## 2020-08-03 DIAGNOSIS — N200 Calculus of kidney: Secondary | ICD-10-CM | POA: Diagnosis not present

## 2020-08-08 ENCOUNTER — Encounter: Payer: Self-pay | Admitting: Physician Assistant

## 2020-08-08 ENCOUNTER — Other Ambulatory Visit: Payer: Self-pay

## 2020-08-08 ENCOUNTER — Ambulatory Visit (INDEPENDENT_AMBULATORY_CARE_PROVIDER_SITE_OTHER): Payer: BC Managed Care – PPO | Admitting: Physician Assistant

## 2020-08-08 VITALS — BP 149/85 | HR 71 | Temp 98.1°F | Wt 192.0 lb

## 2020-08-08 DIAGNOSIS — E559 Vitamin D deficiency, unspecified: Secondary | ICD-10-CM | POA: Diagnosis not present

## 2020-08-08 DIAGNOSIS — E039 Hypothyroidism, unspecified: Secondary | ICD-10-CM | POA: Diagnosis not present

## 2020-08-08 DIAGNOSIS — F4323 Adjustment disorder with mixed anxiety and depressed mood: Secondary | ICD-10-CM

## 2020-08-08 DIAGNOSIS — R946 Abnormal results of thyroid function studies: Secondary | ICD-10-CM | POA: Diagnosis not present

## 2020-08-08 DIAGNOSIS — E7889 Other lipoprotein metabolism disorders: Secondary | ICD-10-CM | POA: Diagnosis not present

## 2020-08-08 DIAGNOSIS — Z0000 Encounter for general adult medical examination without abnormal findings: Secondary | ICD-10-CM

## 2020-08-08 DIAGNOSIS — R7303 Prediabetes: Secondary | ICD-10-CM | POA: Diagnosis not present

## 2020-08-08 DIAGNOSIS — R7989 Other specified abnormal findings of blood chemistry: Secondary | ICD-10-CM

## 2020-08-08 DIAGNOSIS — I10 Essential (primary) hypertension: Secondary | ICD-10-CM | POA: Diagnosis not present

## 2020-08-08 DIAGNOSIS — F5101 Primary insomnia: Secondary | ICD-10-CM

## 2020-08-08 NOTE — Patient Instructions (Signed)
Heart-Healthy Eating Plan Heart-healthy meal planning includes: Eating less unhealthy fats. Eating more healthy fats. Making other changes in your diet. Talk with your doctor or a diet specialist (dietitian) to create an eating plan that is right for you. What is my plan? Your doctor may recommend an eating plan that includes: Total fat: ______% or less of total calories a day. Saturated fat: ______% or less of total calories a day. Cholesterol: less than _________mg a day. What are tips for following this plan? Cooking Avoid frying your food. Try to bake, boil, grill, or broil it instead. You can also reduce fat by: Removing the skin from poultry. Removing all visible fats from meats. Steaming vegetables in water or broth. Meal planning  At meals, divide your plate into four equal parts: Fill one-half of your plate with vegetables and green salads. Fill one-fourth of your plate with whole grains. Fill one-fourth of your plate with lean protein foods. Eat 4-5 servings of vegetables per day. A serving of vegetables is: 1 cup of raw or cooked vegetables. 2 cups of raw leafy greens. Eat 4-5 servings of fruit per day. A serving of fruit is: 1 medium whole fruit.  cup of dried fruit.  cup of fresh, frozen, or canned fruit.  cup of 100% fruit juice. Eat more foods that have soluble fiber. These are apples, broccoli, carrots, beans, peas, and barley. Try to get 20-30 g of fiber per day. Eat 4-5 servings of nuts, legumes, and seeds per week: 1 serving of dried beans or legumes equals  cup after being cooked. 1 serving of nuts is  cup. 1 serving of seeds equals 1 tablespoon.  General information Eat more home-cooked food. Eat less restaurant, buffet, and fast food. Limit or avoid alcohol. Limit foods that are high in starch and sugar. Avoid fried foods. Lose weight if you are overweight. Keep track of how much salt (sodium) you eat. This is important if you have high blood  pressure. Ask your doctor to tell you more about this. Try to add vegetarian meals each week. Fats Choose healthy fats. These include olive oil and canola oil, flaxseeds, walnuts, almonds, and seeds. Eat more omega-3 fats. These include salmon, mackerel, sardines, tuna, flaxseed oil, and ground flaxseeds. Try to eat fish at least 2 times each week. Check food labels. Avoid foods with trans fats or high amounts of saturated fat. Limit saturated fats. These are often found in animal products, such as meats, butter, and cream. These are also found in plant foods, such as palm oil, palm kernel oil, and coconut oil. Avoid foods with partially hydrogenated oils in them. These have trans fats. Examples are stick margarine, some tub margarines, cookies, crackers, and other baked goods. What foods can I eat? Fruits All fresh, canned (in natural juice), or frozen fruits. Vegetables Fresh or frozen vegetables (raw, steamed, roasted, or grilled). Green salads. Grains Most grains. Choose whole wheat and whole grains most of the time. Rice andpasta, including brown rice and pastas made with whole wheat. Meats and other proteins Lean, well-trimmed beef, veal, pork, and lamb. Chicken and turkey without skin. All fish and shellfish. Wild duck, rabbit, pheasant, and venison. Egg whites or low-cholesterol egg substitutes. Dried beans, peas, lentils, and tofu. Seedsand most nuts. Dairy Low-fat or nonfat cheeses, including ricotta and mozzarella. Skim or 1% milk that is liquid, powdered, or evaporated. Buttermilk that is made with low-fatmilk. Nonfat or low-fat yogurt. Fats and oils Non-hydrogenated (trans-free) margarines. Vegetable oils, including soybean, sesame,   sunflower, olive, peanut, safflower, corn, canola, and cottonseed. Salad dressings or mayonnaisemade with a vegetable oil. Beverages Mineral water. Coffee and tea. Diet carbonated beverages. Sweets and desserts Sherbet, gelatin, and fruit ice. Small  amounts of dark chocolate. Limit all sweets and desserts. Seasonings and condiments All seasonings and condiments. The items listed above may not be a complete list of foods and drinks you can eat. Contact a dietitian for more options. What foods should I avoid? Fruits Canned fruit in heavy syrup. Fruit in cream or butter sauce. Fried fruit. Limitcoconut. Vegetables Vegetables cooked in cheese, cream, or butter sauce. Fried vegetables. Grains Breads that are made with saturated or trans fats, oils, or whole milk. Croissants. Sweet rolls. Donuts. High-fat crackers,such as cheese crackers. Meats and other proteins Fatty meats, such as hot dogs, ribs, sausage, bacon, rib-eye roast or steak. High-fat deli meats, such as salami and bologna. Caviar. Domestic duck andgoose. Organ meats, such as liver. Dairy Cream, sour cream, cream cheese, and creamed cottage cheese. Whole-milk cheeses. Whole or 2% milk that is liquid, evaporated, or condensed. Whole buttermilk. Cream sauce or high-fat cheese sauce. Yogurt that is made fromwhole milk. Fats and oils Meat fat, or shortening. Cocoa butter, hydrogenated oils, palm oil, coconut oil, palm kernel oil. Solid fats and shortenings, including bacon fat, salt pork, lard, and butter. Nondairy cream substitutes. Salad dressings with cheeseor sour cream. Beverages Regular sodas and juice drinks with added sugar. Sweets and desserts Frosting. Pudding. Cookies. Cakes. Pies. Milk chocolate or white chocolate.Buttered syrups. Full-fat ice cream or ice cream drinks. The items listed above may not be a complete list of foods and drinks to avoid. Contact a dietitian for more information. Summary Heart-healthy meal planning includes eating less unhealthy fats, eating more healthy fats, and making other changes in your diet. Eat a balanced diet. This includes fruits and vegetables, low-fat or nonfat dairy, lean protein, nuts and legumes, whole grains, and heart-healthy  oils and fats. This information is not intended to replace advice given to you by your health care provider. Make sure you discuss any questions you have with your healthcare provider. Document Revised: 02/26/2017 Document Reviewed: 01/30/2017 Elsevier Patient Education  2022 [employer]  

## 2020-08-08 NOTE — Progress Notes (Signed)
Established Patient Office Visit  Subjective:  Patient ID: Marissa Larson, female    DOB: September 30, 1955  Age: 65 y.o. MRN: [id number]  CC:  Chief Complaint  Patient presents with   Follow-up   Insomnia    HPI Marissa Larson presents for follow-up on insomnia.  Patient reports she has retired and her stress levels have diminished.  States sleep is gradually improving.  Some nights continues to have trouble falling asleep but now is able to stay asleep.  Patient reports tried Lexapro 20 mg which made her groggy and had hot flashes so decreased her dose to 15 mg and has been tolerating without significant issues.  Mood is better.  Patient is fasting for blood work.  Past Medical History:  Diagnosis Date   Arthritis    Chest pain 06/2013   Left sided   Chronic leg pain 02/9922   Complication of anesthesia    Dysrhythmia    Fatigue    GERD (gastroesophageal reflux disease)    History of kidney stones    Hypertension    Hypothyroidism    Nonproductive cough 06/2013   Overweight 01/18/2014   Peripheral neuropathy    PONV (postoperative nausea and vomiting)    Sleep difficulties    Change in sleep patterns   SOB (shortness of breath) 06/2013   Tachycardia 06/2013   Thyroid disease    hypo   UTI (lower urinary tract infection)    Vesico-ureteric reflux     Past Surgical History:  Procedure Laterality Date   ABDOMINAL SURGERY  1994   CYSTOSCOPY/URETEROSCOPY/HOLMIUM LASER/STENT PLACEMENT Left 09/22/2017   Procedure: CYSTOSCOPY/RETROGRADE/URETEROSCOPY/ BASKET STONE EXTRACTION/STENT PLACEMENT;  Surgeon: Ceasar Mons, MD;  Location: [facility];  Service: Urology;  Laterality: Left;  ONLY NEEDS 60 MIN   FOOT SURGERY Right 2009   KIDNEY SURGERY Left 1993   KNEE ARTHROPLASTY Right 11/02/2019   KNEE SURGERY     Multiple knee surgeries   OOPHORECTOMY Left 1994   REPLACEMENT TOTAL KNEE Left 10/13/2018   Dr. Noemi Chapel   small fiber neuropathy biopsy  12/2017   TONSILLECTOMY      URETEROSCOPY VIA URETEROSTOMY Left 1994    Family History  Problem Relation Age of Onset   Migraines Other        Fam Hx    Heart disease Other        Fam Hx   Thyroid disease Other        Fam Hx - She also has thyroid disease   Diabetes Other        Fam Hx   Arthritis Other        Fam Hx   Gallbladder disease Other        Fam Hx   Cancer Other        Fam Hx - Pancreatic   Hypertension Other        Fam Hx   Heart attack Cousin        Multiple cousins with MI's before 66 years of age   Heart disease Mother 71   Hyperlipidemia Mother 47   Hypertension Mother 54   Stroke Mother 17   Heart disease Maternal Grandmother    Cancer Father 25       pancreatic   Diabetes Brother 50   Heart attack Maternal Uncle    Heart attack Paternal Aunt     Social History   Socioeconomic History   Marital status: Married    Spouse name: Not on file  Number of children: 0   Years of education: Not on file   Highest education level: Not on file  Occupational History   Occupation: Business and Designer, jewellery  Tobacco Use   Smoking status: Never   Smokeless tobacco: Never  Vaping Use   Vaping Use: Never used  Substance and Sexual Activity   Alcohol use: Yes    Alcohol/week: 1.0 - 2.0 standard drink    Types: 1 - 2 Glasses of wine per week    Comment: Twice a week   Drug use: No   Sexual activity: Yes    Birth control/protection: None  Other Topics Concern   Not on file  Social History Narrative   Not on file   Social Determinants of Health   Financial Resource Strain: Not on file  Food Insecurity: Not on file  Transportation Needs: Not on file  Physical Activity: Not on file  Stress: Not on file  Social Connections: Not on file  Intimate Partner Violence: Not on file    Outpatient Medications Prior to Visit  Medication Sig Dispense Refill   Cholecalciferol (VITAMIN D) 50 MCG (2000 UT) CAPS 2,000 daily OTC in addition to once weekly prescription 30 capsule     CREAM BASE EX Apply 1 application topically daily. BIEST Transdermal Cream 0.5 mg: Compounded bio-identical Estriol (E3) combined by [facility]     escitalopram (LEXAPRO) 20 MG tablet Take 1 tablet (20 mg total) by mouth daily. 90 tablet 0   Flaxseed, Linseed, (FLAX SEED OIL) 1000 MG CAPS Take 2,000 mg by mouth every evening.     hydroxychloroquine (PLAQUENIL) 200 MG tablet Take 1 tablet 200 mg BID Monday-Friday 120 tablet 0   hydrOXYzine (ATARAX/VISTARIL) 25 MG tablet TAKE 1 TO 2 TABLETS BY MOUTH EVERY 6 TO 8 HOURS AS NEEDED FOR ANXIETY 90 tablet 0   Magnesium 500 MG CAPS Take 1,000 mg by mouth every evening.     meloxicam (MOBIC) 7.5 MG tablet Take 1 tablet (7.5 mg total) by mouth 2 (two) times daily as needed for pain. 60 tablet 1   NONFORMULARY OR COMPOUNDED ITEM Take 150 mg by mouth at bedtime. Bio identical Progesterone 150 ([facility])     Omega-3 Fatty Acids (FISH OIL) 1200 MG CAPS Take 2,400 mg by mouth every evening.     SYNTHROID 112 MCG tablet TAKE 1 TABLET DAILY BEFORE BREAKFAST 90 tablet 3   Vitamin D, Ergocalciferol, (DRISDOL) 1.25 MG (50000 UNIT) CAPS capsule TAKE 1 CAPSULE BY MOUTH EVERY 7 DAYS. 12 capsule 1   famotidine (PEPCID) 40 MG tablet Take 40 mg by mouth daily as needed (for heartburn/indigestion.).  (Patient not taking: Reported on 08/08/2020)  2   gabapentin (NEURONTIN) 300 MG capsule Take 300 mg by mouth at bedtime as needed. (Patient not taking: Reported on 08/08/2020)     No facility-administered medications prior to visit.    Allergies  Allergen Reactions   Penicillins Hives and Itching    Has patient had a PCN reaction causing immediate rash, facial/tongue/throat swelling, SOB or lightheadedness with hypotension: No Has patient had a PCN reaction causing severe rash involving mucus membranes or skin necrosis: No Has patient had a PCN reaction that required hospitalization: No Has patient had a PCN reaction occurring within the last 10 years:  No If all of the above answers are "NO", then may proceed with Cephalosporin use.    Sulfa Antibiotics Hives and Itching    Bactrim    ROS Review of  Systems Review of Systems:  A fourteen system review of systems was performed and found to be positive as per HPI.   Objective:    Physical Exam General:  Well Developed, well nourished, in no acute distress Neuro:  Alert and oriented,  extra-ocular muscles intact  HEENT:  Normocephalic, atraumatic, neck supple, no carotid bruits appreciated  Skin:  no gross rash, warm, pink. Cardiac:  RRR Respiratory:  CTA B/L, Not using accessory muscles, speaking in full sentences- unlabored. Vascular:  Ext warm, no cyanosis apprec.; cap RF less 2 sec. Psych:  No HI/SI, judgement and insight good, Euthymic mood. Full Affect.  BP (!) 149/85   Pulse 71   Temp 98.1 F (36.7 C)   Ht (P) '5\' 6"'  (1.676 m)   Wt 192 lb (87.1 kg)   SpO2 91%   BMI (P) 30.99 kg/m  Wt Readings from Last 3 Encounters:  08/08/20 192 lb (87.1 kg)  07/04/20 196 lb 12.8 oz (89.3 kg)  06/06/20 196 lb 14.4 oz (89.3 kg)     Health Maintenance Due  Topic Date Due   COVID-19 Vaccine (1) Never done   Pneumococcal Vaccine 58-22 Years old (1 - PCV) Never done   Zoster Vaccines- Shingrix (1 of 2) Never done   PAP SMEAR-Modifier  10/23/2019   INFLUENZA VACCINE  08/06/2020    There are no preventive care reminders to display for this patient.  Lab Results  Component Value Date   TSH 0.192 (L) 08/08/2020   Lab Results  Component Value Date   WBC 8.0 08/08/2020   HGB 14.6 08/08/2020   HCT 43.6 08/08/2020   MCV 93 08/08/2020   PLT 248 08/08/2020   Lab Results  Component Value Date   NA 145 (H) 08/08/2020   K 4.4 08/08/2020   CO2 25 08/08/2020   GLUCOSE 97 08/08/2020   BUN 12 08/08/2020   CREATININE 0.53 (L) 08/08/2020   BILITOT 0.5 08/08/2020   ALKPHOS 102 08/08/2020   AST 25 08/08/2020   ALT 17 08/08/2020   PROT 6.5 08/08/2020   ALBUMIN 4.7 08/08/2020    CALCIUM 9.8 08/08/2020   ANIONGAP 10 10/13/2019   EGFR 104 08/08/2020   Lab Results  Component Value Date   CHOL 208 (H) 08/08/2020   Lab Results  Component Value Date   HDL 83 08/08/2020   Lab Results  Component Value Date   LDLCALC 105 (H) 08/08/2020   Lab Results  Component Value Date   TRIG 119 08/08/2020   Lab Results  Component Value Date   CHOLHDL 2.5 08/08/2020   Lab Results  Component Value Date   HGBA1C 5.7 (H) 08/08/2020      Assessment & Plan:   Problem List Items Addressed This Visit       Cardiovascular and Mediastinum   HTN (hypertension) - Primary (Chronic)     Endocrine   Hypothyroidism (Chronic)     Other   Vitamin D deficiency (Chronic)   Elevated HDL (Chronic)   Elevated LFTs (Chronic)   Adjustment disorder with mixed anxiety and depressed mood (Chronic)   Primary insomnia   Prediabetes   Primary insomnia: -Mild improvement. -Good sleep hygiene discussed. -Will continue to monitor.  Adjustment disorder with mixed anxiety and depressed mood: -PHQ-9 score of 1, GAD-7 score of 0 both have improved from prior. Denies SI/HI. -Patient will trial increasing dose of Lexapro to 20 mg and if unable to tolerate recommend resuming Lexapro 15 mg.  Continue hydroxyzine as needed for severe  anxiety. -Will continue to monitor.  Hypertension: -BP elevated in office, patient repots ambulatory BP readings stable <135/85. -Recommend to continue ambulatory BP monitoring. -Follow low-sodium diet and stay well-hydrated.  Healthcare Maintenance: -Will collect fasting labs. -Patient recently had colonoscopy 07/20/2020, pathology was negative for celiac disease, colitis or enteritis.  - -UTD on mammogram and bone density.  No orders of the defined types were placed in this encounter.   Follow-up: Return in about 4 months (around 12/08/2020) for HTN, mood, insomnia .    Lorrene Reid, PA-C

## 2020-08-09 LAB — COMPREHENSIVE METABOLIC PANEL
ALT: 17 IU/L (ref 0–32)
AST: 25 IU/L (ref 0–40)
Albumin/Globulin Ratio: 2.6 — ABNORMAL HIGH (ref 1.2–2.2)
Albumin: 4.7 g/dL (ref 3.8–4.8)
Alkaline Phosphatase: 102 IU/L (ref 44–121)
BUN/Creatinine Ratio: 23 (ref 12–28)
BUN: 12 mg/dL (ref 8–27)
Bilirubin Total: 0.5 mg/dL (ref 0.0–1.2)
CO2: 25 mmol/L (ref 20–29)
Calcium: 9.8 mg/dL (ref 8.7–10.3)
Chloride: 105 mmol/L (ref 96–106)
Creatinine, Ser: 0.53 mg/dL — ABNORMAL LOW (ref 0.57–1.00)
Globulin, Total: 1.8 g/dL (ref 1.5–4.5)
Glucose: 97 mg/dL (ref 65–99)
Potassium: 4.4 mmol/L (ref 3.5–5.2)
Sodium: 145 mmol/L — ABNORMAL HIGH (ref 134–144)
Total Protein: 6.5 g/dL (ref 6.0–8.5)
eGFR: 104 mL/min/{1.73_m2} (ref >59)

## 2020-08-09 LAB — TSH: TSH: 0.192 u[IU]/mL — ABNORMAL LOW (ref 0.450–4.500)

## 2020-08-09 LAB — CBC
Hematocrit: 43.6 % (ref 34.0–46.6)
Hemoglobin: 14.6 g/dL (ref 11.1–15.9)
MCH: 31.1 pg (ref 26.6–33.0)
MCHC: 33.5 g/dL (ref 31.5–35.7)
MCV: 93 fL (ref 79–97)
Platelets: 248 10*3/uL (ref 150–450)
RBC: 4.69 x10E6/uL (ref 3.77–5.28)
RDW: 13.2 % (ref 11.7–15.4)
WBC: 8 10*3/uL (ref 3.4–10.8)

## 2020-08-09 LAB — LIPID PANEL
Chol/HDL Ratio: 2.5 ratio (ref 0.0–4.4)
Cholesterol, Total: 208 mg/dL — ABNORMAL HIGH (ref 100–199)
HDL: 83 mg/dL (ref >39)
LDL Chol Calc (NIH): 105 mg/dL — ABNORMAL HIGH (ref 0–99)
Triglycerides: 119 mg/dL (ref 0–149)
VLDL Cholesterol Cal: 20 mg/dL (ref 5–40)

## 2020-08-09 LAB — HEMOGLOBIN A1C
Est. average glucose Bld gHb Est-mCnc: 117 mg/dL
Hgb A1c MFr Bld: 5.7 % — ABNORMAL HIGH (ref 4.8–5.6)

## 2020-08-09 LAB — VITAMIN D 25 HYDROXY (VIT D DEFICIENCY, FRACTURES): Vit D, 25-Hydroxy: 40.6 ng/mL (ref 30.0–100.0)

## 2020-08-10 ENCOUNTER — Encounter: Payer: Self-pay | Admitting: Internal Medicine

## 2020-08-12 ENCOUNTER — Encounter: Payer: Self-pay | Admitting: Physician Assistant

## 2020-08-13 ENCOUNTER — Encounter: Payer: Self-pay | Admitting: Physician Assistant

## 2020-08-13 LAB — SPECIMEN STATUS REPORT

## 2020-08-13 LAB — T3: T3, Total: 125 ng/dL (ref 71–180)

## 2020-08-13 LAB — T4, FREE: Free T4: 1.55 ng/dL (ref 0.82–1.77)

## 2020-08-29 ENCOUNTER — Other Ambulatory Visit: Payer: Self-pay | Admitting: Physician Assistant

## 2020-08-29 DIAGNOSIS — E559 Vitamin D deficiency, unspecified: Secondary | ICD-10-CM

## 2020-08-31 ENCOUNTER — Other Ambulatory Visit: Payer: Self-pay | Admitting: Physician Assistant

## 2020-08-31 NOTE — Telephone Encounter (Signed)
Meloxicam interacts with SSRI.  We will have to reduce the dose of meloxicam to 7.5 mg p.o. daily.

## 2020-08-31 NOTE — Telephone Encounter (Signed)
Please schedule patient for a follow up visit. Patient due November 2022. Thanks!  

## 2020-08-31 NOTE — Telephone Encounter (Signed)
Next Visit: November 2022. Message sent to the front to schedule patient   Last Visit: 07/04/2020  Last Fill: 07/04/2020  DX: Inflammatory arthritis  Current Dose per office note 07/04/2020:  meloxicam 7.5 mg 1 to 2 tablets by mouth daily as needed for pain relief   Labs: 07/03/2020 CBC WNL, 08/08/2020 CMP Creat. 0.53, Sodium 145, Albumin/Globulin Ratio 2.6  Okay to refill Mobic?

## 2020-08-31 NOTE — Telephone Encounter (Signed)
Attempted to contact the patient to schedule follow up visit. Left message for patient to call the office.

## 2020-09-04 ENCOUNTER — Other Ambulatory Visit: Payer: Self-pay | Admitting: Physician Assistant

## 2020-09-04 DIAGNOSIS — F4323 Adjustment disorder with mixed anxiety and depressed mood: Secondary | ICD-10-CM

## 2020-09-11 ENCOUNTER — Ambulatory Visit: Payer: BC Managed Care – PPO | Admitting: Internal Medicine

## 2020-09-12 ENCOUNTER — Encounter: Payer: Self-pay | Admitting: Internal Medicine

## 2020-09-13 ENCOUNTER — Other Ambulatory Visit: Payer: Self-pay | Admitting: Physician Assistant

## 2020-09-14 ENCOUNTER — Encounter: Payer: Self-pay | Admitting: Internal Medicine

## 2020-09-14 ENCOUNTER — Other Ambulatory Visit: Payer: Self-pay

## 2020-09-14 ENCOUNTER — Ambulatory Visit: Payer: BC Managed Care – PPO | Admitting: Internal Medicine

## 2020-09-14 ENCOUNTER — Ambulatory Visit (INDEPENDENT_AMBULATORY_CARE_PROVIDER_SITE_OTHER): Payer: BC Managed Care – PPO | Admitting: Internal Medicine

## 2020-09-14 VITALS — BP 130/92 | HR 74 | Ht 66.0 in | Wt 189.4 lb

## 2020-09-14 DIAGNOSIS — E039 Hypothyroidism, unspecified: Secondary | ICD-10-CM | POA: Diagnosis not present

## 2020-09-14 DIAGNOSIS — R7303 Prediabetes: Secondary | ICD-10-CM

## 2020-09-14 LAB — T4, FREE: Free T4: 0.94 ng/dL (ref 0.60–1.60)

## 2020-09-14 LAB — TSH: TSH: 0.27 u[IU]/mL — ABNORMAL LOW (ref 0.35–5.50)

## 2020-09-14 MED ORDER — SYNTHROID 100 MCG PO TABS: 100.0000 ug | ORAL_TABLET | Freq: Every day | ORAL | 3 refills | Status: DC

## 2020-09-14 NOTE — Progress Notes (Signed)
Patient ID: Marissa Larson, female   DOB: 08-02-1955, 65 y.o.   MRN: [id number]   This visit occurred during the SARS-CoV-2 public health emergency.  Safety protocols were in place, including screening questions prior to the visit, additional usage of staff PPE, and extensive cleaning of exam room while observing appropriate contact time as indicated for disinfecting solutions.   HPI  Marissa Larson is a 65 y.o.-year-old female, initially referred by her PCP, Dr. Sharee Holsterpalski, returning for follow-up for hypothyroidism and prediabetes.  She previously saw Dr. Leslie DalesAltheimer from 2017-2019.   Latest visit with me 6 months ago.  Her husband is also my patient.  Interim history: She had R TKR in 11/2019 >> slow recovery >> then hurt her back >> on pain medicines, steroids.  She had insomnia and fatigue at last visit she is now feeling much better - off Gabapentin, only taking Meloxicam now (lower dose). She had diarrhea >> now better. She plans to start Plaquenil. She has incipient insomnia - increasing Lexapro. Also on Lunesta.  She is transitioning to a more plant-based diet (Dr. Dutch QuintGundry's), like her husband. She has more heat intolerance lately.  Hypothyroidism  -Diagnosed in her 420-23 y/o, when she presented with several symptoms including fatigue  -on Synthroid DAW (brand name for last 2 years) 112 mcg.  She takes Synthroid: - in am - fasting - at least 30 min from b'fast - no calcium - no iron - no multivitamins - no PPIs, but takes Pepcid or Tums - at night - not on Biotin  Reviewed her TFTs: Lab Results  Component Value Date   TSH 0.192 (L) 08/08/2020   TSH 0.44 03/09/2020   TSH 1.300 08/17/2019   TSH 1.17 05/19/2019   TSH 1.700 06/17/2018   TSH 0.486 06/10/2017   TSH 0.61 02/20/2017   TSH 0.409 (L) 12/08/2016   TSH 1.10 10/10/2015   TSH 0.75 04/27/2015   FREET4 1.55 08/08/2020   FREET4 1.10 03/09/2020   FREET4 1.16 05/19/2019   FREET4 1.37 06/17/2018   FREET4 1.66 06/10/2017    FREET4 1.4 10/10/2015   FREET4 1.55 04/27/2015   T3FREE 3.1 06/17/2018   T3FREE 2.9 06/10/2017   T3FREE 3.2 10/10/2015   Per Dr. Altheimer's records: She [place] in about 1982-2012 prior to relocating here. TSH 1.680 on 09/29/13 on levothyroxine 112 mcg daily. TSH 1.450 on 03/23/14 on levothyroxine 112 mcg daily. TSH 1.450 on 07/21/14 on levothyroxine 112 mcg daily. Thyroid peroxidase (anti-TPO) antibody negative and thyroglobulin antibody negative in 3/17. FT4 1.59 (0.82-1.77), TSH 0.338 (0.450-4.500) in 3/17 on levothyroxine 112 mcg daily. FT4 1.55 (0.82-1.77), TSH 0.753 (0.450-4.500) in 4/17 on levothyroxine 112 mcg daily. FT4 1.45 (0.82-1.77), TSH 0.846 (0.450-4.500) in 10/17 on levothyroxine 112 mcg daily. FT4 1.0 (0.6-1.4), TSH 1.492 (0.450-5.330) in 6/18 on levothyroxine 112 mcg daily. FT4 1.1 (0.6-1.4), TSH 0.605 (0.450-5.330) in 2/19 on levothyroxine 112 mcg daily. FT4 0.9 (0.6-1.4), TSH 0.486 (0.450-5.330) in 10/19 on levothyroxine 112 mcg daily.  Pt denies: - feeling nodules in neck - hoarseness - dysphagia - choking - SOB with lying down  She has + FH of thyroid disorders in: Mother (thyroidectomy for goiter) and sister, M aunt (thyroidectomy for goiter).  No family history of thyroid cancer. No history of radiation therapy to head or neck.  No recent use of iodine supplements.  Not on biotin.  Mild prediabetes:  Reviewed HbA1c levels: Lab Results  Component Value Date   HGBA1C 5.7 (H) 08/08/2020  HGBA1C 5.5 08/17/2019   HGBA1C 5.5 05/19/2019   HGBA1C 5.7 (H) 06/17/2018   HGBA1C 5.7 (H) 06/10/2017   HGBA1C 5.5 12/08/2016   HGBA1C 5.5 10/10/2015  03/09/2020: HbA1c 5.8%  She is not on any medications for her prediabetes.  She is not checking sugars.  No CKD: Lab Results  Component Value Date   BUN 12 08/08/2020   BUN 19 10/13/2019   Lab Results  Component Value Date   CREATININE 0.53 (L) 08/08/2020    CREATININE 0.45 10/13/2019   + HL; latest lipids: Lab Results  Component Value Date   CHOL 208 (H) 08/08/2020   HDL 83 08/08/2020   LDLCALC 105 (H) 08/08/2020   TRIG 119 08/08/2020   CHOLHDL 2.5 08/08/2020  On omega-3 fatty acids and flaxseed oil.  Last eye exam: 04/2019 - No DR. On Plaquenil.  She has family history of diabetes in brother.  She has fatty liver - lost weight (20 lbs) 3 years ago, but gained some back.  She sees Dr. Corliss Skains for OA and pseudogout >> on Plaquenil and Colchicine.  She has a history of kidney stones.   ROS: + see HPI  I reviewed pt's medications, allergies, PMH, social hx, family hx, and changes were documented in the history of present illness. Otherwise, unchanged from my initial visit note.  Past Medical History:  Diagnosis Date   Arthritis    Chest pain 06/2013   Left sided   Chronic leg pain 06/2013   Complication of anesthesia    Dysrhythmia    Fatigue    GERD (gastroesophageal reflux disease)    History of kidney stones    Hypertension    Hypothyroidism    Nonproductive cough 06/2013   Overweight 01/18/2014   Peripheral neuropathy    PONV (postoperative nausea and vomiting)    Sleep difficulties    Change in sleep patterns   SOB (shortness of breath) 06/2013   Tachycardia 06/2013   Thyroid disease    hypo   UTI (lower urinary tract infection)    Vesico-ureteric reflux    Past Surgical History:  Procedure Laterality Date   ABDOMINAL SURGERY  1994   CYSTOSCOPY/URETEROSCOPY/HOLMIUM LASER/STENT PLACEMENT Left 09/22/2017   Procedure: CYSTOSCOPY/RETROGRADE/URETEROSCOPY/ BASKET STONE EXTRACTION/STENT PLACEMENT;  Surgeon: Rene Paci, MD;  Location: [facility];  Service: Urology;  Laterality: Left;  ONLY NEEDS 60 MIN   FOOT SURGERY Right 2009   KIDNEY SURGERY Left 1993   KNEE ARTHROPLASTY Right 11/02/2019   KNEE SURGERY     Multiple knee surgeries   OOPHORECTOMY Left 1994   REPLACEMENT TOTAL KNEE Left 10/13/2018   Dr.  Thurston Hole   small fiber neuropathy biopsy  12/2017   TONSILLECTOMY     URETEROSCOPY VIA URETEROSTOMY Left 1994   Social History   Socioeconomic History   Marital status: Married    Spouse name: Not on file   Number of children: 0   Years of education: Not on file   Highest education level: Not on file  Occupational History   Occupation: Business and [employer]  Tobacco Use   Smoking status: Never   Smokeless tobacco: Never  Vaping Use   Vaping Use: Never used  Substance and Sexual Activity   Alcohol use: Yes    Alcohol/week: 1.0 - 2.0 standard drink    Types: 1 - 2 Glasses of wine per week    Comment: Twice a week   Drug use: No   Sexual activity: Yes    Birth control/protection:  None  Other Topics Concern   Not on file  Social History Narrative   Not on file   Social Determinants of Health   Financial Resource Strain: Not on file  Food Insecurity: Not on file  Transportation Needs: Not on file  Physical Activity: Not on file  Stress: Not on file  Social Connections: Not on file  Intimate Partner Violence: Not on file   Current Outpatient Medications on File Prior to Visit  Medication Sig Dispense Refill   CREAM BASE EX Apply 1 application topically daily. BIEST Transdermal Cream 0.5 mg: Compounded bio-identical Estriol (E3) combined by [facility]     escitalopram (LEXAPRO) 20 MG tablet TAKE 1 TABLET (20 MG TOTAL) BY MOUTH DAILY. 90 tablet 0   famotidine (PEPCID) 40 MG tablet Take 40 mg by mouth daily as needed (for heartburn/indigestion.).  (Patient not taking: Reported on 08/08/2020)  2   Flaxseed, Linseed, (FLAX SEED OIL) 1000 MG CAPS Take 2,000 mg by mouth every evening.     hydroxychloroquine (PLAQUENIL) 200 MG tablet Take 1 tablet 200 mg BID Monday-Friday 120 tablet 0   hydrOXYzine (ATARAX/VISTARIL) 25 MG tablet TAKE 1 TO 2 TABLETS BY MOUTH EVERY 6 TO 8 HOURS AS NEEDED FOR ANXIETY 90 tablet 0   Magnesium 500 MG CAPS Take 1,000 mg by mouth every  evening.     meloxicam (MOBIC) 7.5 MG tablet Take 1 tablet (7.5 mg total) by mouth daily. 30 tablet 0   NONFORMULARY OR COMPOUNDED ITEM Take 150 mg by mouth at bedtime. Bio identical Progesterone 150 ([facility])     Omega-3 Fatty Acids (FISH OIL) 1200 MG CAPS Take 2,400 mg by mouth every evening.     SYNTHROID 112 MCG tablet TAKE 1 TABLET DAILY BEFORE BREAKFAST 90 tablet 3   Vitamin D, Ergocalciferol, (DRISDOL) 1.25 MG (50000 UNIT) CAPS capsule TAKE 1 CAPSULE BY MOUTH EVERY 7 DAYS. 12 capsule 0   No current facility-administered medications on file prior to visit.   Allergies  Allergen Reactions   Penicillins Hives and Itching    Has patient had a PCN reaction causing immediate rash, facial/tongue/throat swelling, SOB or lightheadedness with hypotension: No Has patient had a PCN reaction causing severe rash involving mucus membranes or skin necrosis: No Has patient had a PCN reaction that required hospitalization: No Has patient had a PCN reaction occurring within the last 10 years: No If all of the above answers are "NO", then may proceed with Cephalosporin use.    Sulfa Antibiotics Hives and Itching    Bactrim   Family History  Problem Relation Age of Onset   Migraines Other        Fam Hx    Heart disease Other        Fam Hx   Thyroid disease Other        Fam Hx - She also has thyroid disease   Diabetes Other        Fam Hx   Arthritis Other        Fam Hx   Gallbladder disease Other        Fam Hx   Cancer Other        Fam Hx - Pancreatic   Hypertension Other        Fam Hx   Heart attack Cousin        Multiple cousins with MI's before 55 years of age   Heart disease Mother 80   Hyperlipidemia Mother 38   Hypertension  Mother 63   Stroke Mother 64   Heart disease Maternal Grandmother    Cancer Father 46       pancreatic   Diabetes Brother 50   Heart attack Maternal Uncle    Heart attack Paternal Aunt    PE: BP (!) 130/92 (BP Location: Right Arm, Patient  Position: Sitting, Cuff Size: Normal)   Pulse 74   Ht 5\' 6"  (1.676 m)   Wt 189 lb 6.4 oz (85.9 kg)   SpO2 95%   BMI 30.57 kg/m  Wt Readings from Last 3 Encounters:  09/14/20 189 lb 6.4 oz (85.9 kg)  08/08/20 192 lb (87.1 kg)  07/04/20 196 lb 12.8 oz (89.3 kg)   Constitutional: overweight, in NAD Eyes: PERRLA, EOMI, no exophthalmos ENT: moist mucous membranes, no thyromegaly, no cervical lymphadenopathy Cardiovascular: RRR, No MRG Respiratory: CTA B Gastrointestinal: abdomen soft, NT, ND, BS+ Musculoskeletal: no deformities, strength intact in all 4 Skin: moist, warm, no rashes Neurological: no tremor with outstretched hands, DTR normal in all 4  ASSESSMENT: 1.  Acquired hypothyroidism -No clear evidence of Hashimoto's thyroiditis  2.  Mild prediabetes  PLAN:  1. Patient with longstanding hypothyroidism, Synthroid d.a.w. -At last visit, her TFTs were normal, however, latest thyroid labs reviewed with pt. >> TSH was suppressed Lab Results  Component Value Date   TSH 0.192 (L) 08/08/2020  - she continues on Synthroid d.a.w. 112 mcg daily - pt feels good on this dose. Lost 7 lbs intentionally in the last 3 months, with the changes in her diet.  She has insomnia, which is not new but also hot flashes, which have been exacerbated lately.  We discussed that these may be related to thyrotoxicosis. - we discussed about taking the thyroid hormone every day, with water, >30 minutes before breakfast, separated by >4 hours from acid reflux medications, calcium, iron, multivitamins. Pt. is taking it correctly. - will check thyroid tests today: TSH and fT4 and change the levothyroxine dose accordingly. - If labs are abnormal, she will need to return for repeat TFTs in 1.5 months - OTW, I will see her back in 6 months  2.  Mild prediabetes -Patient with history of mild prediabetes, with HbA1c being 5.8%, slightly higher, at last visit.  At that time, the most likely cause for decreased  HbA1c was decreased activity after her TKR and also back pain for which she was on steroids and gabapentin. -Indeed, latest HbA1c was slightly lower, at 5.7% obtained last month -At last visit we discussed about referral to nutrition but she wanted to hold off the referral at that time -We continue to follow her by HbA1c.  I do not feel that she absolutely needs to start checking her blood sugars at home -She does not have increased urination, increased thirst, blurry vision, unexplained weight loss or increased hunger -We will recheck her HbA1c at next visit  Refills per [facility].  Component     Latest Ref Rng & Units 09/14/2020  T4,Free(Direct)     0.60 - 1.60 ng/dL 11/14/2020  TSH     6.86 - 1.68 uIU/mL 0.27 (L)  TSH is low, so we will need to switch to Synthroid 100 mcg daily and recheck her test in 1.5 months.  3.72, MD PhD [facility] Endocrinology

## 2020-09-14 NOTE — Patient Instructions (Signed)
Please stop at the lab.  Please continue Synthroid 112 mcg daily.  Take the thyroid hormone every day, with water, at least 30 minutes before breakfast, separated by at least 4 hours from: - acid reflux medications - calcium - iron - multivitamins  Please come back for a follow-up appointment in 6 months.  

## 2020-09-17 ENCOUNTER — Encounter: Payer: Self-pay | Admitting: Internal Medicine

## 2020-09-28 ENCOUNTER — Other Ambulatory Visit: Payer: Self-pay | Admitting: Rheumatology

## 2020-09-28 NOTE — Telephone Encounter (Signed)
Next Visit: Return for Inflammatory arthritis   Last Visit: 07/04/2020  Last Fill: 08/31/2020  DX: Pseudogout involving multiple joints  Current Dose per office note 07/04/2020: We will have to reduce the dose of meloxicam to 7.5 mg p.o. daily.  Labs: 08/08/2020 Creat. 0.53, Sodium 145  Okay to refill Mobic?

## 2020-10-02 ENCOUNTER — Other Ambulatory Visit: Payer: Self-pay | Admitting: Physician Assistant

## 2020-10-02 NOTE — Telephone Encounter (Signed)
Next Visit: due November 2022.   Last Visit: 07/04/2020  Labs: 08/08/2020 Creat. 0.53, Sodium 145, Albumin/Globulin Ratio 2.6, CBC WNL  Eye exam: 04/20/2019 normal   Current Dose per office note 07/04/2020:  restarting on Plaquenil 200 mg 1 tablet by mouth twice daily Monday through Friday  PJ:SRPRXYVOPFYT arthritis  Last Fill: 07/04/2020  Left message to advise patient she is due to update PLQ eye exam and to schedule a follow up visit in our office.   Okay to refill Plaquenil?

## 2020-10-16 ENCOUNTER — Other Ambulatory Visit: Payer: Self-pay

## 2020-10-16 ENCOUNTER — Encounter: Payer: Self-pay | Admitting: Physician Assistant

## 2020-10-16 ENCOUNTER — Ambulatory Visit (INDEPENDENT_AMBULATORY_CARE_PROVIDER_SITE_OTHER): Payer: BC Managed Care – PPO | Admitting: Physician Assistant

## 2020-10-16 VITALS — BP 143/80 | HR 75 | Temp 98.0°F | Ht 66.0 in | Wt 186.2 lb

## 2020-10-16 DIAGNOSIS — R051 Acute cough: Secondary | ICD-10-CM

## 2020-10-16 DIAGNOSIS — R300 Dysuria: Secondary | ICD-10-CM | POA: Diagnosis not present

## 2020-10-16 DIAGNOSIS — R109 Unspecified abdominal pain: Secondary | ICD-10-CM | POA: Diagnosis not present

## 2020-10-16 LAB — POCT URINALYSIS DIP (CLINITEK)
Bilirubin, UA: NEGATIVE
Blood, UA: NEGATIVE
Glucose, UA: NEGATIVE mg/dL
Ketones, POC UA: NEGATIVE mg/dL
Leukocytes, UA: NEGATIVE
Nitrite, UA: NEGATIVE
POC PROTEIN,UA: NEGATIVE
Spec Grav, UA: 1.02 (ref 1.010–1.025)
Urobilinogen, UA: 0.2 E.U./dL
pH, UA: 7.5 (ref 5.0–8.0)

## 2020-10-16 MED ORDER — BENZONATATE 100 MG PO CAPS: 200.0000 mg | ORAL_CAPSULE | Freq: Three times a day (TID) | ORAL | 0 refills | Status: DC | PRN

## 2020-10-16 NOTE — Patient Instructions (Addendum)
Dysuria Dysuria is pain or discomfort during urination. The pain or discomfort may be felt in the part of the body that drains urine from the bladder (urethra) or in the surrounding tissue of the genitals. The pain may also be felt in the groin area, lower abdomen, or lower back. You may have to urinate frequently or have the sudden feeling that you have to urinate (urgency). Dysuria can affect anyone, but it is more common in females. Dysuria can be caused by many different things, including: Urinary tract infection. Kidney stones or bladder stones. Certain STIs (sexually transmitted infections), such as chlamydia. Dehydration. Inflammation of the tissues of the vagina. Use of certain medicines. Use of certain soaps or scented products that cause irritation. Follow these instructions at home: Medicines Take over-the-counter and prescription medicines only as told by your health care provider. If you were prescribed an antibiotic medicine, take it as told by your health care provider. Do not stop taking the antibiotic even if you start to feel better. Eating and drinking  Drink enough fluid to keep your urine pale yellow. Avoid caffeinated beverages, tea, and alcohol. These beverages can irritate the bladder and make dysuria worse. In males, alcohol may irritate the prostate. General instructions Watch your condition for any changes. Urinate often. Avoid holding urine for long periods of time. If you are female, you should wipe from front to back after urinating or having a bowel movement. Use each piece of toilet paper only once. Empty your bladder after sex. Keep all follow-up visits. This is important. If you had any tests done to find the cause of dysuria, it is up to you to get your test results. Ask your health care provider, or the department that is doing the test, when your results will be ready. Contact a health care provider if: You have a fever. You develop pain in your back or  sides. You have nausea or vomiting. You have blood in your urine. You are not urinating as often as you usually do. Get help right away if: Your pain is severe and not relieved with medicines. You cannot eat or drink without vomiting. You are confused. You have a rapid heartbeat while resting. You have shaking or chills. You feel extremely weak. Summary Dysuria is pain or discomfort while urinating. Many different conditions can lead to dysuria. If you have dysuria, you may have to urinate frequently or have the sudden feeling that you have to urinate (urgency). Watch your condition for any changes. Keep all follow-up visits. Make sure that you urinate often and drink enough fluid to keep your urine pale yellow. This information is not intended to replace advice given to you by your health care provider. Make sure you discuss any questions you have with your health care provider. Document Revised: 08/05/2019 Document Reviewed: 08/05/2019 Elsevier Patient Education  2022 [employer]   Cough, Adult Coughing is a reflex that clears your throat and your airways (respiratory system). Coughing helps to heal and protect your lungs. It is normal to cough occasionally, but a cough that happens with other symptoms or lasts a long time may be a sign of a condition that needs treatment. An acute cough may only last 2-3 weeks, while a chronic cough may last 8 or more weeks. Coughing is commonly caused by: Infection of the respiratory systemby viruses or bacteria. Breathing in substances that irritate your lungs. Allergies. Asthma. Mucus that runs down the back of your throat (postnasal drip). Smoking. Acid backing up  from the stomach into the esophagus (gastroesophageal reflux). Certain medicines. Chronic lung problems. Other medical conditions such as heart failure or a blood clot in the lung (pulmonary embolism). Follow these instructions at home: Medicines Take over-the-counter and  prescription medicines only as told by your health care provider. Talk with your health care provider before you take a cough suppressant medicine. Lifestyle  Avoid cigarette smoke. Do not use any products that contain nicotine or tobacco, such as cigarettes, e-cigarettes, and chewing tobacco. If you need help quitting, ask your health care provider. Drink enough fluid to keep your urine pale yellow. Avoid caffeine. Do not drink alcohol if your health care provider tells you not to drink. General instructions  Pay close attention to changes in your cough. Tell your health care provider about them. Always cover your mouth when you cough. Avoid things that make you cough, such as perfume, candles, cleaning products, or campfire or tobacco smoke. If the air is dry, use a cool mist vaporizer or humidifier in your bedroom or your home to help loosen secretions. If your cough is worse at night, try to sleep in a semi-upright position. Rest as needed. Keep all follow-up visits as told by your health care provider. This is important. Contact a health care provider if you: Have new symptoms. Cough up pus. Have a cough that does not get better after 2-3 weeks or gets worse. Cannot control your cough with cough suppressant medicines and you are losing sleep. Have pain that gets worse or pain that is not helped with medicine. Have a fever. Have unexplained weight loss. Have night sweats. Get help right away if: You cough up blood. You have difficulty breathing. Your heartbeat is very fast. These symptoms may represent a serious problem that is an emergency. Do not wait to see if the symptoms will go away. Get medical help right away. Call your local emergency services (911 in the [place] Do not drive yourself to the hospital. Summary Coughing is a reflex that clears your throat and your airways. It is normal to cough occasionally, but a cough that happens with other symptoms or lasts a long time  may be a sign of a condition that needs treatment. Take over-the-counter and prescription medicines only as told by your health care provider. Always cover your mouth when you cough. Contact a health care provider if you have new symptoms or a cough that does not get better after 2-3 weeks or gets worse. This information is not intended to replace advice given to you by your health care provider. Make sure you discuss any questions you have with your health care provider. Document Revised: 01/11/2018 Document Reviewed: 01/11/2018 Elsevier Patient Education  2022 [employer].

## 2020-10-16 NOTE — Progress Notes (Signed)
Acute Office Visit  Subjective:    Patient ID: Marissa Larson, female    DOB: 03-03-1955, 65 y.o.   MRN: [id number]  Chief Complaint  Patient presents with   Urinary Tract Infection    HPI Patient is in today for c/o flank pain, dysuria and urinary frequency x 4 days. Has been pushing fluids. Also c/o dry cough x 1 week which is worse at night. Runny nose that has been on and off and nasal congestion. No fever, headache, sore throat, earache, chills, shortness of breath, wheezing or n/v.  Past Medical History:  Diagnosis Date   Arthritis    Chest pain 06/2013   Left sided   Chronic leg pain 02/5364   Complication of anesthesia    Dysrhythmia    Fatigue    GERD (gastroesophageal reflux disease)    History of kidney stones    Hypertension    Hypothyroidism    Nonproductive cough 06/2013   Overweight 01/18/2014   Peripheral neuropathy    PONV (postoperative nausea and vomiting)    Sleep difficulties    Change in sleep patterns   SOB (shortness of breath) 06/2013   Tachycardia 06/2013   Thyroid disease    hypo   UTI (lower urinary tract infection)    Vesico-ureteric reflux     Past Surgical History:  Procedure Laterality Date   ABDOMINAL SURGERY  1994   CYSTOSCOPY/URETEROSCOPY/HOLMIUM LASER/STENT PLACEMENT Left 09/22/2017   Procedure: CYSTOSCOPY/RETROGRADE/URETEROSCOPY/ BASKET STONE EXTRACTION/STENT PLACEMENT;  Surgeon: Ceasar Mons, MD;  Location: [facility];  Service: Urology;  Laterality: Left;  ONLY NEEDS 60 MIN   FOOT SURGERY Right 2009   KIDNEY SURGERY Left 1993   KNEE ARTHROPLASTY Right 11/02/2019   KNEE SURGERY     Multiple knee surgeries   OOPHORECTOMY Left 1994   REPLACEMENT TOTAL KNEE Left 10/13/2018   Dr. Noemi Chapel   small fiber neuropathy biopsy  12/2017   TONSILLECTOMY     URETEROSCOPY VIA URETEROSTOMY Left 1994    Family History  Problem Relation Age of Onset   Migraines Other        Fam Hx    Heart disease Other        Fam Hx   Thyroid  disease Other        Fam Hx - She also has thyroid disease   Diabetes Other        Fam Hx   Arthritis Other        Fam Hx   Gallbladder disease Other        Fam Hx   Cancer Other        Fam Hx - Pancreatic   Hypertension Other        Fam Hx   Heart attack Cousin        Multiple cousins with MI's before 41 years of age   Heart disease Mother 37   Hyperlipidemia Mother 89   Hypertension Mother 30   Stroke Mother 38   Heart disease Maternal Grandmother    Cancer Father 71       pancreatic   Diabetes Brother 23   Heart attack Maternal Uncle    Heart attack Paternal Aunt     Social History   Socioeconomic History   Marital status: Married    Spouse name: Not on file   Number of children: 0   Years of education: Not on file   Highest education level: Not on file  Occupational History   Occupation: Business and  financial analyst  Tobacco Use   Smoking status: Never   Smokeless tobacco: Never  Vaping Use   Vaping Use: Never used  Substance and Sexual Activity   Alcohol use: Yes    Alcohol/week: 1.0 - 2.0 standard drink    Types: 1 - 2 Glasses of wine per week    Comment: Twice a week   Drug use: No   Sexual activity: Yes    Birth control/protection: None  Other Topics Concern   Not on file  Social History Narrative   Not on file   Social Determinants of Health   Financial Resource Strain: Not on file  Food Insecurity: Not on file  Transportation Needs: Not on file  Physical Activity: Not on file  Stress: Not on file  Social Connections: Not on file  Intimate Partner Violence: Not on file    Outpatient Medications Prior to Visit  Medication Sig Dispense Refill   CREAM BASE EX Apply 1 application topically daily. BIEST Transdermal Cream 0.5 mg: Compounded bio-identical Estriol (E3) combined by [facility]     escitalopram (LEXAPRO) 20 MG tablet TAKE 1 TABLET (20 MG TOTAL) BY MOUTH DAILY. 90 tablet 0   eszopiclone (LUNESTA) 1 MG TABS tablet Take 1 mg  by mouth at bedtime as needed.     famotidine (PEPCID) 40 MG tablet Take 40 mg by mouth daily as needed (for heartburn/indigestion.).  2   Flaxseed, Linseed, (FLAX SEED OIL) 1000 MG CAPS Take 2,000 mg by mouth every evening.     hydroxychloroquine (PLAQUENIL) 200 MG tablet TAKE 1 TABLET BY MOUTH TWICE DAILY MONDAY THROUGH FRIDAY. 120 tablet 0   hydrOXYzine (ATARAX/VISTARIL) 25 MG tablet TAKE 1 TO 2 TABLETS BY MOUTH EVERY 6 TO 8 HOURS AS NEEDED FOR ANXIETY 90 tablet 0   Magnesium 500 MG CAPS Take 1,000 mg by mouth every evening.     Melatonin 10 MG TABS Take by mouth.     meloxicam (MOBIC) 7.5 MG tablet TAKE 1 TABLET (7.5 MG TOTAL) BY MOUTH DAILY. 30 tablet 0   NONFORMULARY OR COMPOUNDED ITEM Take 150 mg by mouth at bedtime. Bio identical Progesterone 150 ([facility])     Omega-3 Fatty Acids (FISH OIL) 1200 MG CAPS Take 2,400 mg by mouth every evening.     SYNTHROID 100 MCG tablet Take 1 tablet (100 mcg total) by mouth daily before breakfast. 90 tablet 3   Vitamin D, Ergocalciferol, (DRISDOL) 1.25 MG (50000 UNIT) CAPS capsule TAKE 1 CAPSULE BY MOUTH EVERY 7 DAYS. 12 capsule 0   No facility-administered medications prior to visit.    Allergies  Allergen Reactions   Penicillins Hives and Itching    Has patient had a PCN reaction causing immediate rash, facial/tongue/throat swelling, SOB or lightheadedness with hypotension: No Has patient had a PCN reaction causing severe rash involving mucus membranes or skin necrosis: No Has patient had a PCN reaction that required hospitalization: No Has patient had a PCN reaction occurring within the last 10 years: No If all of the above answers are "NO", then may proceed with Cephalosporin use.    Sulfa Antibiotics Hives and Itching    Bactrim    Review of Systems Review of Systems:  A fourteen system review of systems was performed and found to be positive as per HPI.    Objective:    Physical Exam General:  Well Developed, well  nourished, appropriate for stated age.  Neuro:  Alert and oriented,  extra-ocular muscles intact  HEENT:  Normocephalic, atraumatic, neck supple Skin:  no gross rash, warm, pink. Cardiac:  RRR, S1 S2 Respiratory:  CTA B/L and A/P, Not using accessory muscles, speaking in full sentences- unlabored. Abdomen: nontender, nondistended, no guarding or rebound tenderness, + mild CVA tenderness (left), -CVA tenderness (right) Vascular:  Ext warm, no cyanosis apprec.; cap RF less 2 sec. Psych:  No HI/SI, judgement and insight good, Euthymic mood. Full Affect.  BP (!) 143/80   Pulse 75   Temp 98 F (36.7 C)   Ht '5\' 6"'  (1.676 m)   Wt 186 lb 3.2 oz (84.5 kg)   SpO2 95%   BMI 30.05 kg/m  Wt Readings from Last 3 Encounters:  10/16/20 186 lb 3.2 oz (84.5 kg)  09/14/20 189 lb 6.4 oz (85.9 kg)  08/08/20 192 lb (87.1 kg)    Health Maintenance Due  Topic Date Due   COVID-19 Vaccine (1) Never done   HIV Screening  Never done   Zoster Vaccines- Shingrix (1 of 2) Never done   PAP SMEAR-Modifier  10/23/2019   INFLUENZA VACCINE  Never done    There are no preventive care reminders to display for this patient.   Lab Results  Component Value Date   TSH 0.27 (L) 09/14/2020   Lab Results  Component Value Date   WBC 8.0 08/08/2020   HGB 14.6 08/08/2020   HCT 43.6 08/08/2020   MCV 93 08/08/2020   PLT 248 08/08/2020   Lab Results  Component Value Date   NA 145 (H) 08/08/2020   K 4.4 08/08/2020   CO2 25 08/08/2020   GLUCOSE 97 08/08/2020   BUN 12 08/08/2020   CREATININE 0.53 (L) 08/08/2020   BILITOT 0.5 08/08/2020   ALKPHOS 102 08/08/2020   AST 25 08/08/2020   ALT 17 08/08/2020   PROT 6.5 08/08/2020   ALBUMIN 4.7 08/08/2020   CALCIUM 9.8 08/08/2020   ANIONGAP 10 10/13/2019   EGFR 104 08/08/2020   Lab Results  Component Value Date   CHOL 208 (H) 08/08/2020   Lab Results  Component Value Date   HDL 83 08/08/2020   Lab Results  Component Value Date   LDLCALC 105 (H)  08/08/2020   Lab Results  Component Value Date   TRIG 119 08/08/2020   Lab Results  Component Value Date   CHOLHDL 2.5 08/08/2020   Lab Results  Component Value Date   HGBA1C 5.7 (H) 08/08/2020       Assessment & Plan:   Problem List Items Addressed This Visit       Other   Dysuria   Relevant Orders   Urine Culture   Flank pain - Primary   Relevant Orders   POCT URINALYSIS DIP (CLINITEK) (Completed)   Urine Culture   Other Visit Diagnoses     Acute cough       Relevant Medications   benzonatate (TESSALON PERLES) 100 MG capsule      Flank pain, Dysuria: -UA collected, unremarkable. Will send for urine culture to rule out cystitis.  Patient does have a history of vesicoureteral reflux.  Recommend to continue hydration.  If symptoms fail to improve or worsen will consider starting empiric antibiotic therapy if urine culture has not resulted.  Pending urine culture results will change treatment therapy if indicated.  Acute cough: -Likely viral etiology.  No red flag s/s concerning for bacterial infection present at this time.  Recommend supportive care including decongestant such as Mucinex. Take benzonatate as needed for cough relief.  If symptoms fail to improve or worsen recommend further evaluation with chest x-ray.   Meds ordered this encounter  Medications   benzonatate (TESSALON PERLES) 100 MG capsule    Sig: Take 2 capsules (200 mg total) by mouth 3 (three) times daily as needed for cough.    Dispense:  30 capsule    Refill:  0    Order Specific Question:   Supervising Provider    Answer:   Beatrice Lecher D [[id number]]    Note:  This note was prepared with assistance of Dragon voice recognition software. Occasional wrong-word or sound-a-like substitutions may have occurred due to the inherent limitations of voice recognition software.   Lorrene Reid, PA-C

## 2020-10-19 LAB — URINE CULTURE

## 2020-10-26 ENCOUNTER — Other Ambulatory Visit: Payer: Self-pay | Admitting: Rheumatology

## 2020-10-26 NOTE — Telephone Encounter (Signed)
Next Visit: Return for Inflammatory arthritis    Last Visit: 07/04/2020   Last Fill: 09/28/2020   DX: Pseudogout involving multiple joints   Current Dose per office note 07/04/2020: We will have to reduce the dose of meloxicam to 7.5 mg p.o. daily.   Labs: 08/08/2020 Creat. 0.53, Sodium 145   Okay to refill Mobic?

## 2020-11-21 ENCOUNTER — Other Ambulatory Visit: Payer: Self-pay | Admitting: Physician Assistant

## 2020-11-21 DIAGNOSIS — E559 Vitamin D deficiency, unspecified: Secondary | ICD-10-CM

## 2020-11-23 ENCOUNTER — Other Ambulatory Visit: Payer: Self-pay | Admitting: Physician Assistant

## 2020-11-23 DIAGNOSIS — M25461 Effusion, right knee: Secondary | ICD-10-CM | POA: Diagnosis not present

## 2020-11-23 DIAGNOSIS — M25562 Pain in left knee: Secondary | ICD-10-CM | POA: Diagnosis not present

## 2020-11-23 NOTE — Telephone Encounter (Signed)
Next Visit: Return for Inflammatory arthritis    Last Visit: 07/04/2020   Last Fill: 09/28/2020   DX: Pseudogout involving multiple joints   Current Dose per office note 07/04/2020: We will have to reduce the dose of meloxicam to 7.5 mg p.o. daily.   Labs: 08/08/2020 Creat. 0.53, Sodium 145   Okay to refill Mobic?

## 2020-12-03 ENCOUNTER — Other Ambulatory Visit: Payer: Self-pay | Admitting: Physician Assistant

## 2020-12-03 DIAGNOSIS — F4323 Adjustment disorder with mixed anxiety and depressed mood: Secondary | ICD-10-CM

## 2020-12-11 ENCOUNTER — Other Ambulatory Visit: Payer: Self-pay

## 2020-12-11 ENCOUNTER — Ambulatory Visit (INDEPENDENT_AMBULATORY_CARE_PROVIDER_SITE_OTHER): Payer: BC Managed Care – PPO | Admitting: Physician Assistant

## 2020-12-11 ENCOUNTER — Encounter: Payer: Self-pay | Admitting: Physician Assistant

## 2020-12-11 VITALS — BP 122/78 | HR 70 | Temp 97.8°F | Ht 66.0 in | Wt 187.0 lb

## 2020-12-11 DIAGNOSIS — R5383 Other fatigue: Secondary | ICD-10-CM | POA: Diagnosis not present

## 2020-12-11 DIAGNOSIS — E039 Hypothyroidism, unspecified: Secondary | ICD-10-CM | POA: Diagnosis not present

## 2020-12-11 DIAGNOSIS — R7303 Prediabetes: Secondary | ICD-10-CM

## 2020-12-11 DIAGNOSIS — E7889 Other lipoprotein metabolism disorders: Secondary | ICD-10-CM

## 2020-12-11 DIAGNOSIS — I10 Essential (primary) hypertension: Secondary | ICD-10-CM | POA: Diagnosis not present

## 2020-12-11 DIAGNOSIS — F4323 Adjustment disorder with mixed anxiety and depressed mood: Secondary | ICD-10-CM

## 2020-12-11 DIAGNOSIS — F5104 Psychophysiologic insomnia: Secondary | ICD-10-CM

## 2020-12-11 DIAGNOSIS — E559 Vitamin D deficiency, unspecified: Secondary | ICD-10-CM | POA: Diagnosis not present

## 2020-12-11 MED ORDER — HYDROXYZINE HCL 25 MG PO TABS: 25.0000 mg | ORAL_TABLET | Freq: Every evening | ORAL | 0 refills | Status: DC | PRN

## 2020-12-11 NOTE — Assessment & Plan Note (Signed)
-  Stable. -Continue Lexapro 15 mg. -Will continue to monitor.

## 2020-12-11 NOTE — Assessment & Plan Note (Addendum)
-  Followed by Endocrinology. -Will collect thyroid labs and forward to Dr. Elvera Lennox.

## 2020-12-11 NOTE — Assessment & Plan Note (Signed)
-  Last Vitamin D 40.6, will repeat Vitamin D today. Patient on Vitamin D 50,000 units weekly. Pending lab results will adjust treatment plan if indicated.

## 2020-12-11 NOTE — Assessment & Plan Note (Signed)
-  BP initially mildly elevated, BP repeated and stable. -Patient managing with diet and lifestyle changes. -Will continue to monitor.

## 2020-12-11 NOTE — Progress Notes (Signed)
Established Patient Office Visit  Subjective:  Patient ID: Marissa Larson, female    DOB: 1955-07-02  Age: 65 y.o. MRN: [id number]  CC:  Chief Complaint  Patient presents with   Follow-up    Mood    Hypertension    HPI Marissa Larson presents for follow up on hypertension, insomnia and mood.  Patient reports has been sick 3 times since October and has felt tired. Inquiring about hormone testing. Is fasting for blood work. Patient reports thinks she has been taking older rx of levothyroxine, unsure if has been taking new adjusted dose.   HTN: Pt denies chest pain, palpitations, dizziness or lower extremity swelling. Taking medication as directed without side effects. Checks BP at home few times/wk and readings range in 130-135/80s.   Insomnia: Patient reports discontinued melatonin and has been sleeping better. Continues to have some trouble with falling asleep but sleeping longer.  Mood: Patient reports taking Lexapro 15 mg which seems to be working well.  States 20 mg was too strong of a dose.  Takes hydroxyzine before bedtime.  Since her retirement stress levels have decreased.  Past Medical History:  Diagnosis Date   Arthritis    Chest pain 06/2013   Left sided   Chronic leg pain 05/6385   Complication of anesthesia    Dysrhythmia    Fatigue    GERD (gastroesophageal reflux disease)    History of kidney stones    Hypertension    Hypothyroidism    Nonproductive cough 06/2013   Overweight 01/18/2014   Peripheral neuropathy    PONV (postoperative nausea and vomiting)    Sleep difficulties    Change in sleep patterns   SOB (shortness of breath) 06/2013   Tachycardia 06/2013   Thyroid disease    hypo   UTI (lower urinary tract infection)    Vesico-ureteric reflux     Past Surgical History:  Procedure Laterality Date   ABDOMINAL SURGERY  1994   CYSTOSCOPY/URETEROSCOPY/HOLMIUM LASER/STENT PLACEMENT Left 09/22/2017   Procedure: CYSTOSCOPY/RETROGRADE/URETEROSCOPY/ BASKET  STONE EXTRACTION/STENT PLACEMENT;  Surgeon: Ceasar Mons, MD;  Location: [facility];  Service: Urology;  Laterality: Left;  ONLY NEEDS 60 MIN   FOOT SURGERY Right 2009   KIDNEY SURGERY Left 1993   KNEE ARTHROPLASTY Right 11/02/2019   KNEE SURGERY     Multiple knee surgeries   OOPHORECTOMY Left 1994   REPLACEMENT TOTAL KNEE Left 10/13/2018   Dr. Noemi Chapel   small fiber neuropathy biopsy  12/2017   TONSILLECTOMY     URETEROSCOPY VIA URETEROSTOMY Left 1994    Family History  Problem Relation Age of Onset   Migraines Other        Fam Hx    Heart disease Other        Fam Hx   Thyroid disease Other        Fam Hx - She also has thyroid disease   Diabetes Other        Fam Hx   Arthritis Other        Fam Hx   Gallbladder disease Other        Fam Hx   Cancer Other        Fam Hx - Pancreatic   Hypertension Other        Fam Hx   Heart attack Cousin        Multiple cousins with MI's before 62 years of age   Heart disease Mother 23   Hyperlipidemia Mother 78   Hypertension Mother  75   Stroke Mother 1   Heart disease Maternal Grandmother    Cancer Father 25       pancreatic   Diabetes Brother 79   Heart attack Maternal Uncle    Heart attack Paternal Aunt     Social History   Socioeconomic History   Marital status: Married    Spouse name: Not on file   Number of children: 0   Years of education: Not on file   Highest education level: Not on file  Occupational History   Occupation: Business and Designer, jewellery  Tobacco Use   Smoking status: Never   Smokeless tobacco: Never  Vaping Use   Vaping Use: Never used  Substance and Sexual Activity   Alcohol use: Yes    Alcohol/week: 1.0 - 2.0 standard drink    Types: 1 - 2 Glasses of wine per week    Comment: Twice a week   Drug use: No   Sexual activity: Yes    Birth control/protection: None  Other Topics Concern   Not on file  Social History Narrative   Not on file   Social Determinants of Health    Financial Resource Strain: Not on file  Food Insecurity: Not on file  Transportation Needs: Not on file  Physical Activity: Not on file  Stress: Not on file  Social Connections: Not on file  Intimate Partner Violence: Not on file    Outpatient Medications Prior to Visit  Medication Sig Dispense Refill   benzonatate (TESSALON PERLES) 100 MG capsule Take 2 capsules (200 mg total) by mouth 3 (three) times daily as needed for cough. 30 capsule 0   CREAM BASE EX Apply 1 application topically daily. BIEST Transdermal Cream 0.5 mg: Compounded bio-identical Estriol (E3) combined by [place]     escitalopram (LEXAPRO) 20 MG tablet TAKE 1 TABLET BY MOUTH DAILY. 90 tablet 0   eszopiclone (LUNESTA) 1 MG TABS tablet Take 1 mg by mouth at bedtime as needed.     famotidine (PEPCID) 40 MG tablet Take 40 mg by mouth daily as needed (for heartburn/indigestion.).  2   Flaxseed, Linseed, (FLAX SEED OIL) 1000 MG CAPS Take 2,000 mg by mouth every evening.     gabapentin (NEURONTIN) 300 MG capsule      Magnesium 500 MG CAPS Take 1,000 mg by mouth every evening.     meloxicam (MOBIC) 7.5 MG tablet TAKE 1 TABLET (7.5 MG TOTAL) BY MOUTH DAILY. 30 tablet 2   NONFORMULARY OR COMPOUNDED ITEM Take 150 mg by mouth at bedtime. Bio identical Progesterone 150 ([facility])     Omega-3 Fatty Acids (FISH OIL) 1200 MG CAPS Take 2,400 mg by mouth every evening.     SYNTHROID 100 MCG tablet Take 1 tablet (100 mcg total) by mouth daily before breakfast. 90 tablet 3   Vitamin D, Ergocalciferol, (DRISDOL) 1.25 MG (50000 UNIT) CAPS capsule TAKE 1 CAPSULE BY MOUTH EVERY 7 DAYS. 12 capsule 0   hydroxychloroquine (PLAQUENIL) 200 MG tablet TAKE 1 TABLET BY MOUTH TWICE DAILY MONDAY THROUGH FRIDAY. 120 tablet 0   hydrOXYzine (ATARAX/VISTARIL) 25 MG tablet TAKE 1 TO 2 TABLETS BY MOUTH EVERY 6 TO 8 HOURS AS NEEDED FOR ANXIETY 90 tablet 0   Melatonin 10 MG TABS Take by mouth.     No facility-administered  medications prior to visit.    Allergies  Allergen Reactions   Penicillins Hives and Itching    Has patient had a PCN reaction causing immediate  rash, facial/tongue/throat swelling, SOB or lightheadedness with hypotension: No Has patient had a PCN reaction causing severe rash involving mucus membranes or skin necrosis: No Has patient had a PCN reaction that required hospitalization: No Has patient had a PCN reaction occurring within the last 10 years: No If all of the above answers are "NO", then may proceed with Cephalosporin use.    Sulfa Antibiotics Hives and Itching    Bactrim    ROS Review of Systems A fourteen system review of systems was performed and found to be positive as per HPI.   Objective:    Physical Exam General: Pleasant and cooperative, in no acute distress Neuro:  Alert and oriented,  extra-ocular muscles intact  HEENT:  Normocephalic, atraumatic, neck supple Skin:  no gross rash, warm, pink. Cardiac:  RRR, S1 S2 Respiratory: CTA B/L, Not using accessory muscles, speaking in full sentences- unlabored. Vascular:  Ext warm, no cyanosis apprec.; cap RF less 2 sec. Psych:  No HI/SI, judgement and insight good, Euthymic mood. Full Affect.  BP 122/78   Pulse 70   Temp 97.8 F (36.6 C)   Ht '5\' 6"'  (1.676 m)   Wt 187 lb (84.8 kg)   SpO2 96%   BMI 30.18 kg/m  Wt Readings from Last 3 Encounters:  12/11/20 187 lb (84.8 kg)  10/16/20 186 lb 3.2 oz (84.5 kg)  09/14/20 189 lb 6.4 oz (85.9 kg)     Health Maintenance Due  Topic Date Due   COVID-19 Vaccine (1) Never done   Pneumococcal Vaccine 3-2 Years old (1 - PCV) Never done   HIV Screening  Never done   Zoster Vaccines- Shingrix (1 of 2) Never done   PAP SMEAR-Modifier  10/23/2019   INFLUENZA VACCINE  Never done    There are no preventive care reminders to display for this patient.  Lab Results  Component Value Date   TSH 0.27 (L) 09/14/2020   Lab Results  Component Value Date   WBC 8.0  08/08/2020   HGB 14.6 08/08/2020   HCT 43.6 08/08/2020   MCV 93 08/08/2020   PLT 248 08/08/2020   Lab Results  Component Value Date   NA 145 (H) 08/08/2020   K 4.4 08/08/2020   CO2 25 08/08/2020   GLUCOSE 97 08/08/2020   BUN 12 08/08/2020   CREATININE 0.53 (L) 08/08/2020   BILITOT 0.5 08/08/2020   ALKPHOS 102 08/08/2020   AST 25 08/08/2020   ALT 17 08/08/2020   PROT 6.5 08/08/2020   ALBUMIN 4.7 08/08/2020   CALCIUM 9.8 08/08/2020   ANIONGAP 10 10/13/2019   EGFR 104 08/08/2020   Lab Results  Component Value Date   CHOL 208 (H) 08/08/2020   Lab Results  Component Value Date   HDL 83 08/08/2020   Lab Results  Component Value Date   LDLCALC 105 (H) 08/08/2020   Lab Results  Component Value Date   TRIG 119 08/08/2020   Lab Results  Component Value Date   CHOLHDL 2.5 08/08/2020   Lab Results  Component Value Date   HGBA1C 5.7 (H) 08/08/2020   Depression screen PHQ 2/9 12/11/2020 10/16/2020 08/08/2020 06/06/2020 01/31/2020  Decreased Interest 0 0 0 0 0  Down, Depressed, Hopeless 0 0 0 0 0  PHQ - 2 Score 0 0 0 0 0  Altered sleeping '3 2 1 3 3  ' Tired, decreased energy 3 2 0 2 2  Change in appetite 0 0 0 1 1  Feeling bad or failure about  yourself  0 0 0 0 0  Trouble concentrating 0 0 0 0 0  Moving slowly or fidgety/restless 0 0 0 0 0  Suicidal thoughts 0 0 - 0 0  PHQ-9 Score '6 4 1 6 6  ' Difficult doing work/chores Not difficult at all - - Not difficult at all Not difficult at all  Some recent data might be hidden   GAD 7 : Generalized Anxiety Score 12/11/2020 10/16/2020 08/08/2020 06/06/2020  Nervous, Anxious, on Edge 0 0 0 1  Control/stop worrying 0 0 0 1  Worry too much - different things 0 0 0 1  Trouble relaxing 0 0 0 1  Restless 0 0 0 1  Easily annoyed or irritable 0 0 0 1  Afraid - awful might happen 0 0 0 0  Total GAD 7 Score 0 0 0 6  Anxiety Difficulty Not difficult at all - - Not difficult at all       Assessment & Plan:   Problem List Items Addressed  This Visit       Cardiovascular and Mediastinum   HTN (hypertension) - Primary (Chronic)    -BP initially mildly elevated, BP repeated and stable. -Patient managing with diet and lifestyle changes. -Will continue to monitor.      Relevant Orders   Comp Met (CMET)   CBC w/Diff   Lipid Profile     Endocrine   Hypothyroidism (Chronic)    -Followed by Endocrinology. -Will collect thyroid labs and forward to Dr. Cruzita Lederer.       Relevant Orders   TSH   T4, free   T3     Other   Vitamin D deficiency (Chronic)    -Last Vitamin D 40.6, will repeat Vitamin D today. Patient on Vitamin D 50,000 units weekly. Pending lab results will adjust treatment plan if indicated.      Relevant Orders   Vitamin D (25 hydroxy)   Fatigue (Chronic)   Relevant Orders   CBC w/Diff   Cortisol   FSH   Elevated HDL (Chronic)   Relevant Orders   Lipid Profile   Adjustment disorder with mixed anxiety and depressed mood (Chronic)    -Stable. -Continue Lexapro 15 mg. -Will continue to monitor.      Chronic insomnia    -Improved. Advised can take hydroxyzine every other day or as needed to help with relaxing before bedtime. -Continue with establishing good sleep hygiene.      Relevant Medications   hydrOXYzine (ATARAX) 25 MG tablet   Prediabetes   Relevant Orders   HgB A1c   Fatigue: -Discussed with patient potential costs for specialized tests if not covered by insurance, patient verbalized understanding.    Meds ordered this encounter  Medications   hydrOXYzine (ATARAX) 25 MG tablet    Sig: Take 1 tablet (25 mg total) by mouth at bedtime as needed.    Dispense:  90 tablet    Refill:  0    Order Specific Question:   Supervising Provider    Answer:   Beatrice Lecher D [[id number]]    Follow-up: Return in about 4 months (around 04/11/2021) for CPE .   Note:  This note was prepared with assistance of Dragon voice recognition software. Occasional wrong-word or sound-a-like substitutions  may have occurred due to the inherent limitations of voice recognition software.  Lorrene Reid, PA-C

## 2020-12-11 NOTE — Assessment & Plan Note (Signed)
-  Improved. Advised can take hydroxyzine every other day or as needed to help with relaxing before bedtime. -Continue with establishing good sleep hygiene.

## 2020-12-12 LAB — COMPREHENSIVE METABOLIC PANEL
ALT: 28 IU/L (ref 0–32)
AST: 34 IU/L (ref 0–40)
Albumin/Globulin Ratio: 2.2 (ref 1.2–2.2)
Albumin: 4.8 g/dL (ref 3.8–4.8)
Alkaline Phosphatase: 105 IU/L (ref 44–121)
BUN/Creatinine Ratio: 23 (ref 12–28)
BUN: 15 mg/dL (ref 8–27)
Bilirubin Total: 0.4 mg/dL (ref 0.0–1.2)
CO2: 28 mmol/L (ref 20–29)
Calcium: 10.1 mg/dL (ref 8.7–10.3)
Chloride: 102 mmol/L (ref 96–106)
Creatinine, Ser: 0.64 mg/dL (ref 0.57–1.00)
Globulin, Total: 2.2 g/dL (ref 1.5–4.5)
Glucose: 102 mg/dL — ABNORMAL HIGH (ref 70–99)
Potassium: 4.3 mmol/L (ref 3.5–5.2)
Sodium: 142 mmol/L (ref 134–144)
Total Protein: 7 g/dL (ref 6.0–8.5)
eGFR: 99 mL/min/{1.73_m2} (ref >59)

## 2020-12-12 LAB — CBC WITH DIFFERENTIAL/PLATELET
Basophils Absolute: 0.1 10*3/uL (ref 0.0–0.2)
Basos: 1 %
EOS (ABSOLUTE): 0.2 10*3/uL (ref 0.0–0.4)
Eos: 2 %
Hematocrit: 43.9 % (ref 34.0–46.6)
Hemoglobin: 15.1 g/dL (ref 11.1–15.9)
Immature Grans (Abs): 0 10*3/uL (ref 0.0–0.1)
Immature Granulocytes: 0 %
Lymphocytes Absolute: 2.6 10*3/uL (ref 0.7–3.1)
Lymphs: 32 %
MCH: 31.5 pg (ref 26.6–33.0)
MCHC: 34.4 g/dL (ref 31.5–35.7)
MCV: 92 fL (ref 79–97)
Monocytes Absolute: 0.5 10*3/uL (ref 0.1–0.9)
Monocytes: 6 %
Neutrophils Absolute: 5 10*3/uL (ref 1.4–7.0)
Neutrophils: 59 %
Platelets: 280 10*3/uL (ref 150–450)
RBC: 4.8 x10E6/uL (ref 3.77–5.28)
RDW: 12.6 % (ref 11.7–15.4)
WBC: 8.4 10*3/uL (ref 3.4–10.8)

## 2020-12-12 LAB — FOLLICLE STIMULATING HORMONE: FSH: 46 m[IU]/mL

## 2020-12-12 LAB — HEMOGLOBIN A1C
Est. average glucose Bld gHb Est-mCnc: 123 mg/dL
Hgb A1c MFr Bld: 5.9 % — ABNORMAL HIGH (ref 4.8–5.6)

## 2020-12-12 LAB — LIPID PANEL
Chol/HDL Ratio: 2.6 ratio (ref 0.0–4.4)
Cholesterol, Total: 220 mg/dL — ABNORMAL HIGH (ref 100–199)
HDL: 86 mg/dL (ref >39)
LDL Chol Calc (NIH): 113 mg/dL — ABNORMAL HIGH (ref 0–99)
Triglycerides: 120 mg/dL (ref 0–149)
VLDL Cholesterol Cal: 21 mg/dL (ref 5–40)

## 2020-12-12 LAB — T4, FREE: Free T4: 1.61 ng/dL (ref 0.82–1.77)

## 2020-12-12 LAB — VITAMIN D 25 HYDROXY (VIT D DEFICIENCY, FRACTURES): Vit D, 25-Hydroxy: 46.2 ng/mL (ref 30.0–100.0)

## 2020-12-12 LAB — T3: T3, Total: 136 ng/dL (ref 71–180)

## 2020-12-12 LAB — CORTISOL: Cortisol: 6.7 ug/dL

## 2020-12-12 LAB — TSH: TSH: 0.421 u[IU]/mL — ABNORMAL LOW (ref 0.450–4.500)

## 2020-12-13 NOTE — Progress Notes (Deleted)
Office Visit Note  Patient: Marissa Larson             Date of Birth: Dec 12, 1955           MRN: [id number]             PCP: Mayer Masker, PA-C Referring: Mayer Masker, PA-C Visit Date: 12/14/2020 Occupation: @GUAROCC @  Subjective:  No chief complaint on file.   History of Present Illness: Marissa Larson is a 65 y.o. female ***   Activities of Daily Living:  Patient reports morning stiffness for *** {minute/hour:19697}.   Patient {ACTIONS;DENIES/REPORTS:21021675::"Denies"} nocturnal pain.  Difficulty dressing/grooming: {ACTIONS;DENIES/REPORTS:21021675::"Denies"} Difficulty climbing stairs: {ACTIONS;DENIES/REPORTS:21021675::"Denies"} Difficulty getting out of chair: {ACTIONS;DENIES/REPORTS:21021675::"Denies"} Difficulty using hands for taps, buttons, cutlery, and/or writing: {ACTIONS;DENIES/REPORTS:21021675::"Denies"}  No Rheumatology ROS completed.   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   Obesity, Class I, BMI 30-34.9 12/03/2016   Kidney stones 09/12/2016   Nausea 09/12/2016   High risk medication use 07/30/2016   NAFLD (nonalcoholic fatty liver disease) 08/01/2016   Primary osteoarthritis of both feet 04/12/2016   History of kidney stones 04/12/2016   Primary osteoarthritis of both hands 04/11/2016   Bilateral primary osteoarthritis of hip 04/11/2016   Primary osteoarthritis of both knees 04/11/2016   Spondylosis of lumbar region without myelopathy or radiculopathy 04/11/2016   Hyperuricemia 04/11/2016   Primary insomnia  04/11/2016   Flank pain 04/02/2016   Sinusitis 04/02/2016   Elevated HDL 11/11/2015   Elevated LFTs 11/11/2015   Adjustment disorder with mixed anxiety and depressed mood 11/11/2015   Dysuria 11/11/2015   Hypothyroidism 10/14/2015   Pseudogout involving multiple joints 10/14/2015   Vitamin D deficiency 10/14/2015   Hormone replacement therapy- per GYN 10/14/2015   HTN (hypertension) 10/14/2015   Fatigue 10/14/2015   h/o Hiatal hernia 10/14/2015   Sleep difficulties 10/14/2015   Generalized OA 10/14/2015   Counseling on health promotion and disease prevention 10/14/2015   Gastric ulcer- due to Mobic 10/09/2015   Age-related nuclear cataract of both eyes 01/25/2015   Posterior vitreous detachment of left eye 01/25/2015   chronic Palpitations 07/19/2013    Past Medical History:  Diagnosis Date   Arthritis    Chest pain 06/2013   Left sided   Chronic leg pain 06/2013   Complication of anesthesia    Dysrhythmia    Fatigue    GERD (gastroesophageal reflux disease)    History of kidney stones    Hypertension    Hypothyroidism    Nonproductive cough 06/2013   Overweight 01/18/2014   Peripheral neuropathy    PONV (postoperative nausea and vomiting)    Sleep difficulties    Change in sleep patterns   SOB (shortness of breath) 06/2013   Tachycardia 06/2013   Thyroid disease    hypo   UTI (lower urinary tract infection)    Vesico-ureteric reflux     Family History  Problem Relation Age of Onset   Migraines Other        Fam Hx    Heart disease Other        Fam  Hx   Thyroid disease Other        Fam Hx - She also has thyroid disease   Diabetes Other        Fam Hx   Arthritis Other        Fam Hx   Gallbladder disease Other        Fam Hx   Cancer Other        Fam Hx - Pancreatic   Hypertension Other        Fam Hx   Heart attack Cousin        Multiple cousins with MI's before 71 years of age   Heart disease Mother 41   Hyperlipidemia Mother 74   Hypertension Mother 6    Stroke Mother 61   Heart disease Maternal Grandmother    Cancer Father 70       pancreatic   Diabetes Brother 73   Heart attack Maternal Uncle    Heart attack Paternal Aunt    Past Surgical History:  Procedure Laterality Date   ABDOMINAL SURGERY  1994   CYSTOSCOPY/URETEROSCOPY/HOLMIUM LASER/STENT PLACEMENT Left 09/22/2017   Procedure: CYSTOSCOPY/RETROGRADE/URETEROSCOPY/ BASKET STONE EXTRACTION/STENT PLACEMENT;  Surgeon: Ceasar Mons, MD;  Location: [facility];  Service: Urology;  Laterality: Left;  ONLY NEEDS 60 MIN   FOOT SURGERY Right 2009   KIDNEY SURGERY Left 1993   KNEE ARTHROPLASTY Right 11/02/2019   KNEE SURGERY     Multiple knee surgeries   OOPHORECTOMY Left 1994   REPLACEMENT TOTAL KNEE Left 10/13/2018   Dr. Noemi Chapel   small fiber neuropathy biopsy  12/2017   TONSILLECTOMY     URETEROSCOPY VIA URETEROSTOMY Left 1994   Social History   Social History Narrative   Not on file   Immunization History  Administered Date(s) Administered   Tdap 01/07/2011     Objective: Vital Signs: There were no vitals taken for this visit.   Physical Exam   Musculoskeletal Exam: ***  CDAI Exam: CDAI Score: -- Patient Global: --; Provider Global: -- Swollen: --; Tender: -- Joint Exam 12/14/2020   No joint exam has been documented for this visit   There is currently no information documented on the homunculus. Go to the Rheumatology activity and complete the homunculus joint exam.  Investigation: No additional findings.  Imaging: No results found.  Recent Labs: Lab Results  Component Value Date   WBC 8.4 12/11/2020   HGB 15.1 12/11/2020   PLT 280 12/11/2020   NA 142 12/11/2020   K 4.3 12/11/2020   CL 102 12/11/2020   CO2 28 12/11/2020   GLUCOSE 102 (H) 12/11/2020   BUN 15 12/11/2020   CREATININE 0.64 12/11/2020   BILITOT 0.4 12/11/2020   ALKPHOS 105 12/11/2020   AST 34 12/11/2020   ALT 28 12/11/2020   PROT 7.0 12/11/2020   ALBUMIN 4.8 12/11/2020    CALCIUM 10.1 12/11/2020   GFRAA 108 08/17/2019   QFTBGOLDPLUS NEGATIVE 02/16/2017    Speciality Comments: PLQ eye exam: 04/20/2019 normal. Dr. Satira Sark. Follow up in 1 year.  Procedures:  No procedures performed Allergies: Penicillins and Sulfa antibiotics   Assessment / Plan:     Visit Diagnoses: No diagnosis found.  Orders: No orders of the defined types were placed in this encounter.  No orders of the defined types were placed in this encounter.   Face-to-face time spent with patient was *** minutes. Greater than 50% of time was spent in counseling and coordination of care.  Follow-Up Instructions: No follow-ups on  file.   Earnestine Mealing, CMA  Note - This record has been created using Editor, commissioning.  Chart creation errors have been sought, but may not always  have been located. Such creation errors do not reflect on  the standard of medical care.

## 2020-12-14 ENCOUNTER — Ambulatory Visit: Payer: BC Managed Care – PPO | Admitting: Rheumatology

## 2020-12-14 DIAGNOSIS — Z87442 Personal history of urinary calculi: Secondary | ICD-10-CM

## 2020-12-14 DIAGNOSIS — I10 Essential (primary) hypertension: Secondary | ICD-10-CM

## 2020-12-14 DIAGNOSIS — Z96651 Presence of right artificial knee joint: Secondary | ICD-10-CM

## 2020-12-14 DIAGNOSIS — M1189 Other specified crystal arthropathies, multiple sites: Secondary | ICD-10-CM

## 2020-12-14 DIAGNOSIS — Z96652 Presence of left artificial knee joint: Secondary | ICD-10-CM

## 2020-12-14 DIAGNOSIS — Z79899 Other long term (current) drug therapy: Secondary | ICD-10-CM

## 2020-12-14 DIAGNOSIS — M160 Bilateral primary osteoarthritis of hip: Secondary | ICD-10-CM

## 2020-12-14 DIAGNOSIS — E790 Hyperuricemia without signs of inflammatory arthritis and tophaceous disease: Secondary | ICD-10-CM

## 2020-12-14 DIAGNOSIS — E559 Vitamin D deficiency, unspecified: Secondary | ICD-10-CM

## 2020-12-14 DIAGNOSIS — K449 Diaphragmatic hernia without obstruction or gangrene: Secondary | ICD-10-CM

## 2020-12-14 DIAGNOSIS — G629 Polyneuropathy, unspecified: Secondary | ICD-10-CM

## 2020-12-14 DIAGNOSIS — F5101 Primary insomnia: Secondary | ICD-10-CM

## 2020-12-14 DIAGNOSIS — M47816 Spondylosis without myelopathy or radiculopathy, lumbar region: Secondary | ICD-10-CM

## 2020-12-14 DIAGNOSIS — K760 Fatty (change of) liver, not elsewhere classified: Secondary | ICD-10-CM

## 2020-12-14 DIAGNOSIS — M1990 Unspecified osteoarthritis, unspecified site: Secondary | ICD-10-CM

## 2020-12-14 DIAGNOSIS — M19041 Primary osteoarthritis, right hand: Secondary | ICD-10-CM

## 2020-12-14 DIAGNOSIS — M19071 Primary osteoarthritis, right ankle and foot: Secondary | ICD-10-CM

## 2020-12-17 ENCOUNTER — Telehealth: Payer: Self-pay | Admitting: Physician Assistant

## 2020-12-17 NOTE — Telephone Encounter (Signed)
Patient returning your call-please call her back at [phone]

## 2020-12-19 ENCOUNTER — Encounter: Payer: Self-pay | Admitting: Nurse Practitioner

## 2020-12-19 ENCOUNTER — Ambulatory Visit (INDEPENDENT_AMBULATORY_CARE_PROVIDER_SITE_OTHER): Payer: BC Managed Care – PPO | Admitting: Nurse Practitioner

## 2020-12-19 VITALS — Temp 98.7°F | Ht 66.0 in | Wt 187.0 lb

## 2020-12-19 DIAGNOSIS — J300 Vasomotor rhinitis: Secondary | ICD-10-CM

## 2020-12-19 DIAGNOSIS — J0140 Acute pansinusitis, unspecified: Secondary | ICD-10-CM

## 2020-12-19 MED ORDER — AZITHROMYCIN 250 MG PO TABS: ORAL_TABLET | ORAL | 0 refills | Status: DC

## 2020-12-19 MED ORDER — FLUTICASONE PROPIONATE 50 MCG/ACT NA SUSP: 2.0000 | Freq: Every day | NASAL | 6 refills | Status: DC

## 2020-12-19 NOTE — Progress Notes (Signed)
Virtual Visit via Telephone Note  I connected with Marissa Larson on 12/19/20 at  2:10 PM EST by telephone and verified that I am speaking with the correct person using two identifiers.  Location: Patient: home Provider: [facility]     I discussed the limitations, risks, security and privacy concerns of performing an evaluation and management service by telephone and the availability of in person appointments. I also discussed with the patient that there may be a patient responsible charge related to this service. The patient expressed understanding and agreed to proceed.   History of Present Illness: Patient presents for an acute visit.  Has nasal congestion which started on Sunday.  She is now having significant head and chest congestion.  She has a sore throat and headache.  She is also coughing a great deal.  She states symptoms have gradually become worse since Sunday.  She did have a fever on Monday of 101.  This has resolved.  She has been taking over-the-counter Coricidin which is helped the symptoms a little bit.  She has no known exposure to flu or COVID-19.  She states she is leaving town this coming Saturday.  She denies nausea or vomiting.   Observations/Objective:  The patient is alert and oriented. She is pleasant and answers all questions appropriately. Breathing is non-labored. She is in no acute distress at this time.  Patient does noticeably nasally congested.  A loose sounding cough is heard over the phone.  Today's Vitals   12/19/20 1403  Temp: 98.7 F (37.1 C)  Weight: 187 lb (84.8 kg)  Height: 5\' 6"  (1.676 m)   Body mass index is 30.18 kg/m.   Assessment and Plan: 1. Acute non-recurrent pansinusitis Start Z-Pak.  Take as directed for 5 days. Rest and increase fluids. Continue using OTC medication to control symptoms.  Encourage patient to take home test for COVID-19.  If positive she should notify the office so that antiviral  medication may be called in for her as well.  She did voice understanding and agreement. - azithromycin (ZITHROMAX) 250 MG tablet; z-pack - take as directed for 5 days  Dispense: 6 tablet; Refill: 0  2. Vasomotor rhinitis Add Flonase nasal spray.  Use 2 sprays in both nostrils daily to help relieve nasal congestion and rhinitis. - fluticasone (FLONASE) 50 MCG/ACT nasal spray; Place 2 sprays into both nostrils daily.  Dispense: 16 g; Refill: 6   Follow Up Instructions:    I discussed the assessment and treatment plan with the patient. The patient was provided an opportunity to ask questions and all were answered. The patient agreed with the plan and demonstrated an understanding of the instructions.   The patient was advised to call back or seek an in-person evaluation if the symptoms worsen or if the condition fails to improve as anticipated.  I provided 10 minutes of non-face-to-face time during this encounter.   , NP   This note was dictated using Carlean Jews. Rapid proofreading was performed to expedite the delivery of the information. Despite proofreading, phonetic errors will occur which are common with this voice recognition software. Please take this into consideration. If there are any concerns, please contact our office.

## 2020-12-21 ENCOUNTER — Encounter: Payer: Self-pay | Admitting: Nurse Practitioner

## 2020-12-21 ENCOUNTER — Ambulatory Visit (INDEPENDENT_AMBULATORY_CARE_PROVIDER_SITE_OTHER): Payer: BC Managed Care – PPO | Admitting: Nurse Practitioner

## 2020-12-21 ENCOUNTER — Other Ambulatory Visit: Payer: Self-pay

## 2020-12-21 VITALS — Ht 66.0 in | Wt 187.0 lb

## 2020-12-21 DIAGNOSIS — J0140 Acute pansinusitis, unspecified: Secondary | ICD-10-CM | POA: Insufficient documentation

## 2020-12-21 DIAGNOSIS — H109 Unspecified conjunctivitis: Secondary | ICD-10-CM

## 2020-12-21 MED ORDER — ERYTHROMYCIN 5 MG/GM OP OINT: 1.0000 "application " | TOPICAL_OINTMENT | Freq: Four times a day (QID) | OPHTHALMIC | 0 refills | Status: DC

## 2020-12-21 NOTE — Progress Notes (Signed)
Virtual Visit via Telephone Note  I connected with Marissa Larson on 12/21/20 at 10:40 AM EST by telephone and verified that I am speaking with the correct person using two identifiers.  Location: Patient: home Provider: [place] [facility]     I discussed the limitations, risks, security and privacy concerns of performing an evaluation and management service by telephone and the availability of in person appointments. I also discussed with the patient that there may be a patient responsible charge related to this service. The patient expressed understanding and agreed to proceed.   History of Present Illness: The patient was treated this past Wednesday, 12/19/2020 for sinusitis. She was started on a zpack. She states that her energy is improving. She is feeling some better. States that at initial visit, she did have some itching and watering of the eyes. She states that this morning, she was unable to open her eyes as they were crusted shut from drainage overnight. She states that her eyes are burning . They are red, irritated, and eyelids are swollen. She has sent photos which depict the severity of the symptoms.    Observations/Objective:  The patient is alert and oriented. She is pleasant and answers all questions appropriately. Breathing is non-labored. She is in no acute distress at this time.  She is nasally congested. Nonproductive cough can still be observed. Photos in MyChart depict redness, eyelid swelling, and excess tearing of both eyes.  Today's Vitals   12/21/20 1028  Weight: 187 lb (84.8 kg)  Height: 5\' 6"  (1.676 m)   Body mass index is 30.18 kg/m.   Assessment and Plan: 1. Bacterial conjunctivitis Add erythromycin eye ointment. Advised her to insert a 1cm ribbon of ointment to inner, lower lid of both eyes, four times daily while awake. Encouraged her keep hands away from her eyes. If accidentally touches them, she should wash hands immediately. Use a  warm, moist compress to the eyes to reduce drainage and crusting and improve irritation.  - erythromycin ophthalmic ointment; Place 1 application into both eyes 4 (four) times daily.  Dispense: 3.5 g; Refill: 0  2. Acute non-recurrent pansinusitis Continue to take z-pack until medication is completely gone. Continue using OTC medication as needed and as indicated to treat acute symptoms.    Follow Up Instructions:    I discussed the assessment and treatment plan with the patient. The patient was provided an opportunity to ask questions and all were answered. The patient agreed with the plan and demonstrated an understanding of the instructions.   The patient was advised to call back or seek an in-person evaluation if the symptoms worsen or if the condition fails to improve as anticipated.  I provided 10 minutes of non-face-to-face time during this encounter.   , NP

## 2021-01-02 ENCOUNTER — Encounter: Payer: Self-pay | Admitting: Physician Assistant

## 2021-01-03 ENCOUNTER — Other Ambulatory Visit: Payer: Self-pay

## 2021-01-03 ENCOUNTER — Ambulatory Visit (INDEPENDENT_AMBULATORY_CARE_PROVIDER_SITE_OTHER): Payer: BC Managed Care – PPO | Admitting: Physician Assistant

## 2021-01-03 ENCOUNTER — Encounter: Payer: Self-pay | Admitting: Physician Assistant

## 2021-01-03 VITALS — BP 118/82 | HR 72 | Temp 97.8°F | Ht 66.0 in | Wt 186.0 lb

## 2021-01-03 DIAGNOSIS — J40 Bronchitis, not specified as acute or chronic: Secondary | ICD-10-CM | POA: Diagnosis not present

## 2021-01-03 MED ORDER — METHYLPREDNISOLONE 4 MG PO TBPK: ORAL_TABLET | ORAL | 0 refills | Status: DC

## 2021-01-03 NOTE — Progress Notes (Signed)
Acute Office Visit  Subjective:    Patient ID: Marissa Larson, female    DOB: 24-Apr-1955, 65 y.o.   MRN: [id number]  Chief Complaint  Patient presents with   Acute Visit   Cough    HPI Patient is in today for c/o residual cough and chest congestion x 3 weeks. Patient reports was treated for sinus infection with antibiotic which helped resolve most of her symptoms except cough and congestion.  States cough is worse at night, does have some coughing fits during the day. No fever or other new symptoms.  Has been taking Robitussin with mild relief.  Past Medical History:  Diagnosis Date   Arthritis    Chest pain 06/2013   Left sided   Chronic leg pain 03/5571   Complication of anesthesia    Dysrhythmia    Fatigue    GERD (gastroesophageal reflux disease)    History of kidney stones    Hypertension    Hypothyroidism    Nonproductive cough 06/2013   Overweight 01/18/2014   Peripheral neuropathy    PONV (postoperative nausea and vomiting)    Sleep difficulties    Change in sleep patterns   SOB (shortness of breath) 06/2013   Tachycardia 06/2013   Thyroid disease    hypo   UTI (lower urinary tract infection)    Vesico-ureteric reflux     Past Surgical History:  Procedure Laterality Date   ABDOMINAL SURGERY  1994   CYSTOSCOPY/URETEROSCOPY/HOLMIUM LASER/STENT PLACEMENT Left 09/22/2017   Procedure: CYSTOSCOPY/RETROGRADE/URETEROSCOPY/ BASKET STONE EXTRACTION/STENT PLACEMENT;  Surgeon: Ceasar Mons, MD;  Location: [facility];  Service: Urology;  Laterality: Left;  ONLY NEEDS 60 MIN   FOOT SURGERY Right 2009   KIDNEY SURGERY Left 1993   KNEE ARTHROPLASTY Right 11/02/2019   KNEE SURGERY     Multiple knee surgeries   OOPHORECTOMY Left 1994   REPLACEMENT TOTAL KNEE Left 10/13/2018   Dr. Noemi Chapel   small fiber neuropathy biopsy  12/2017   TONSILLECTOMY     URETEROSCOPY VIA URETEROSTOMY Left 1994    Family History  Problem Relation Age of Onset   Migraines Other         Fam Hx    Heart disease Other        Fam Hx   Thyroid disease Other        Fam Hx - She also has thyroid disease   Diabetes Other        Fam Hx   Arthritis Other        Fam Hx   Gallbladder disease Other        Fam Hx   Cancer Other        Fam Hx - Pancreatic   Hypertension Other        Fam Hx   Heart attack Cousin        Multiple cousins with MI's before 54 years of age   Heart disease Mother 39   Hyperlipidemia Mother 4   Hypertension Mother 73   Stroke Mother 74   Heart disease Maternal Grandmother    Cancer Father 74       pancreatic   Diabetes Brother 21   Heart attack Maternal Uncle    Heart attack Paternal Aunt     Social History   Socioeconomic History   Marital status: Married    Spouse name: Not on file   Number of children: 0   Years of education: Not on file   Highest education level: Not  on file  Occupational History   Occupation: Scientist, research (medical)  Tobacco Use   Smoking status: Never   Smokeless tobacco: Never  Vaping Use   Vaping Use: Never used  Substance and Sexual Activity   Alcohol use: Yes    Alcohol/week: 1.0 - 2.0 standard drink    Types: 1 - 2 Glasses of wine per week    Comment: Twice a week   Drug use: No   Sexual activity: Yes    Birth control/protection: None  Other Topics Concern   Not on file  Social History Narrative   Not on file   Social Determinants of Health   Financial Resource Strain: Not on file  Food Insecurity: Not on file  Transportation Needs: Not on file  Physical Activity: Not on file  Stress: Not on file  Social Connections: Not on file  Intimate Partner Violence: Not on file    Outpatient Medications Prior to Visit  Medication Sig Dispense Refill   omeprazole (PRILOSEC) 40 MG capsule Take by mouth.     benzonatate (TESSALON PERLES) 100 MG capsule Take 2 capsules (200 mg total) by mouth 3 (three) times daily as needed for cough. 30 capsule 0   CREAM BASE EX Apply 1 application topically  daily. BIEST Transdermal Cream 0.5 mg: Compounded bio-identical Estriol (E3) combined by [employer]     erythromycin ophthalmic ointment Place 1 application into both eyes 4 (four) times daily. 3.5 g 0   escitalopram (LEXAPRO) 20 MG tablet TAKE 1 TABLET BY MOUTH DAILY. 90 tablet 0   eszopiclone (LUNESTA) 1 MG TABS tablet Take 1 mg by mouth at bedtime as needed.     famotidine (PEPCID) 40 MG tablet Take 40 mg by mouth daily as needed (for heartburn/indigestion.).  2   Flaxseed, Linseed, (FLAX SEED OIL) 1000 MG CAPS Take 2,000 mg by mouth every evening.     fluticasone (FLONASE) 50 MCG/ACT nasal spray Place 2 sprays into both nostrils daily. 16 g 6   gabapentin (NEURONTIN) 300 MG capsule      hydrOXYzine (ATARAX) 25 MG tablet Take 1 tablet (25 mg total) by mouth at bedtime as needed. 90 tablet 0   Magnesium 500 MG CAPS Take 1,000 mg by mouth every evening.     meloxicam (MOBIC) 7.5 MG tablet TAKE 1 TABLET (7.5 MG TOTAL) BY MOUTH DAILY. 30 tablet 2   NONFORMULARY OR COMPOUNDED ITEM Take 150 mg by mouth at bedtime. Bio identical Progesterone 150 ([facility])     Omega-3 Fatty Acids (FISH OIL) 1200 MG CAPS Take 2,400 mg by mouth every evening.     SYNTHROID 100 MCG tablet Take 1 tablet (100 mcg total) by mouth daily before breakfast. (Patient taking differently: Take 112 mcg by mouth daily before breakfast.) 90 tablet 3   Vitamin D, Ergocalciferol, (DRISDOL) 1.25 MG (50000 UNIT) CAPS capsule TAKE 1 CAPSULE BY MOUTH EVERY 7 DAYS. 12 capsule 0   azithromycin (ZITHROMAX) 250 MG tablet z-pack - take as directed for 5 days 6 tablet 0   No facility-administered medications prior to visit.    Allergies  Allergen Reactions   Penicillins Hives and Itching    Has patient had a PCN reaction causing immediate rash, facial/tongue/throat swelling, SOB or lightheadedness with hypotension: No Has patient had a PCN reaction causing severe rash involving mucus membranes or skin necrosis: No Has  patient had a PCN reaction that required hospitalization: No Has patient had a PCN reaction occurring within the  last 10 years: No If all of the above answers are "NO", then may proceed with Cephalosporin use.    Sulfa Antibiotics Hives and Itching    Bactrim    Review of Systems Review of Systems:  A fourteen system review of systems was performed and found to be positive as per HPI.    Objective:    Physical Exam General:  Pleasant and cooperative, in no acute distress   Neuro:  Alert and oriented,  extra-ocular muscles intact  HEENT:  Normocephalic, atraumatic, neck supple, no adenopathy  Skin:  no gross rash, warm, pink. Cardiac:  RRR, S1 S2 Respiratory: CTA B/L w/o wheezing, crackles or rales, slight dec air movement. Vascular:  Ext warm, no cyanosis apprec.; cap RF less 2 sec. Psych:  No HI/SI, judgement and insight good, Euthymic mood. Full Affect.  BP 118/82    Pulse 72    Temp 97.8 F (36.6 C)    Ht '5\' 6"'  (1.676 m)    Wt 186 lb (84.4 kg)    SpO2 97%    BMI 30.02 kg/m  Wt Readings from Last 3 Encounters:  01/03/21 186 lb (84.4 kg)  12/21/20 187 lb (84.8 kg)  12/19/20 187 lb (84.8 kg)    Health Maintenance Due  Topic Date Due   COVID-19 Vaccine (1) Never done   Pneumococcal Vaccine 29-36 Years old (1 - PCV) Never done   HIV Screening  Never done   Zoster Vaccines- Shingrix (1 of 2) Never done   PAP SMEAR-Modifier  10/23/2019   INFLUENZA VACCINE  Never done    There are no preventive care reminders to display for this patient.   Lab Results  Component Value Date   TSH 0.421 (L) 12/11/2020   Lab Results  Component Value Date   WBC 8.4 12/11/2020   HGB 15.1 12/11/2020   HCT 43.9 12/11/2020   MCV 92 12/11/2020   PLT 280 12/11/2020   Lab Results  Component Value Date   NA 142 12/11/2020   K 4.3 12/11/2020   CO2 28 12/11/2020   GLUCOSE 102 (H) 12/11/2020   BUN 15 12/11/2020   CREATININE 0.64 12/11/2020   BILITOT 0.4 12/11/2020   ALKPHOS 105  12/11/2020   AST 34 12/11/2020   ALT 28 12/11/2020   PROT 7.0 12/11/2020   ALBUMIN 4.8 12/11/2020   CALCIUM 10.1 12/11/2020   ANIONGAP 10 10/13/2019   EGFR 99 12/11/2020   Lab Results  Component Value Date   CHOL 220 (H) 12/11/2020   Lab Results  Component Value Date   HDL 86 12/11/2020   Lab Results  Component Value Date   LDLCALC 113 (H) 12/11/2020   Lab Results  Component Value Date   TRIG 120 12/11/2020   Lab Results  Component Value Date   CHOLHDL 2.6 12/11/2020   Lab Results  Component Value Date   HGBA1C 5.9 (H) 12/11/2020       Assessment & Plan:   Problem List Items Addressed This Visit   None Visit Diagnoses     Bronchitis    -  Primary   Relevant Medications   methylPREDNISolone (MEDROL DOSEPAK) 4 MG TBPK tablet      Discussed with patient has s/sx associated with bronchitis likely secondary to upper respiratory infection, will start corticosteroid therapy. Advised patient to continue home supportive care. If symptoms fail to improve or worsen recommend further evaluation with imaging studies (chest x-ray).  Meds ordered this encounter  Medications   methylPREDNISolone (MEDROL DOSEPAK)  4 MG TBPK tablet    Sig: Take as directed on package.    Dispense:  21 tablet    Refill:  0    Order Specific Question:   Supervising Provider    Answer:   Beatrice Lecher D [[id number]]     Lorrene Reid, PA-C

## 2021-01-03 NOTE — Patient Instructions (Signed)
Acute Bronchitis, Adult ?Acute bronchitis is when air tubes in the lungs (bronchi) suddenly get swollen. The condition can make it hard for you to breathe. In adults, acute bronchitis usually goes away within 2 weeks. A cough caused by bronchitis may last up to 3 weeks. Smoking, allergies, and asthma can make the condition worse. ?What are the causes? ?Germs that cause cold and flu (viruses). The most common cause of this condition is the virus that causes the common cold. ?Bacteria. ?Substances that bother (irritate) the lungs, including: ?Smoke from cigarettes and other types of tobacco. ?Dust and pollen. ?Fumes from chemicals, gases, or burned fuel. ?Indoor or outdoor air pollution. ?What increases the risk? ?A weak body's defense system. This is also called the immune system. ?Any condition that affects your lungs and breathing, such as asthma. ?What are the signs or symptoms? ?A cough. ?Coughing up clear, yellow, or green mucus. ?Making high-pitched whistling sounds when you breathe, most often when you breathe out (wheezing). ?Runny or stuffy nose. ?Having too much mucus in your lungs (chest congestion). ?Shortness of breath. ?Body aches. ?A sore throat. ?How is this treated? ?Acute bronchitis may go away over time without treatment. Your doctor may tell you to: ?Drink more fluids. This will help thin your mucus so it is easier to cough up. ?Use a device that gets medicine into your lungs (inhaler). ?Use a vaporizer or a humidifier. These are machines that add water to the air. This helps with coughing and poor breathing. ?Take a medicine that thins mucus and helps clear it from your lungs. ?Take a medicine that prevents or stops coughing. ?It is not common to take an antibiotic medicine for this condition. ?Follow these instructions at home: ? ?Take over-the-counter and prescription medicines only as told by your doctor. ?Use an inhaler, vaporizer, or humidifier as told by your doctor. ?Take two teaspoons (10  mL) of honey at bedtime. This helps lessen your coughing at night. ?Drink enough fluid to keep your pee (urine) pale yellow. ?Do not smoke or use any products that contain nicotine or tobacco. If you need help quitting, ask your doctor. ?Get a lot of rest. ?Return to your normal activities when your doctor says that it is safe. ?Keep all follow-up visits. ?How is this prevented? ? ?Wash your hands often with soap and water for at least 20 seconds. If you cannot use soap and water, use hand sanitizer. ?Avoid contact with people who have cold symptoms. ?Try not to touch your mouth, nose, or eyes with your hands. ?Avoid breathing in smoke or chemical fumes. ?Make sure to get the flu shot every year. ?Contact a doctor if: ?Your symptoms do not get better in 2 weeks. ?You have trouble coughing up the mucus. ?Your cough keeps you awake at night. ?You have a fever. ?Get help right away if: ?You cough up blood. ?You have chest pain. ?You have very bad shortness of breath. ?You faint or keep feeling like you are going to faint. ?You have a very bad headache. ?Your fever or chills get worse. ?These symptoms may be an emergency. Get help right away. Call your local emergency services (911 in the [place] ?Do not wait to see if the symptoms will go away. ?Do not drive yourself to the hospital. ?Summary ?Acute bronchitis is when air tubes in the lungs (bronchi) suddenly get swollen. In adults, acute bronchitis usually goes away within 2 weeks. ?Drink more fluids. This will help thin your mucus so it is easier to   cough up. ?Take over-the-counter and prescription medicines only as told by your doctor. ?Contact a doctor if your symptoms do not improve after 2 weeks of treatment. ?This information is not intended to replace advice given to you by your health care provider. Make sure you discuss any questions you have with your health care provider. ?Document Revised: 04/25/2020 Document Reviewed: 04/25/2020 ?Elsevier Patient Education  ? 2022 [employer] ? ?

## 2021-01-14 ENCOUNTER — Other Ambulatory Visit: Payer: Self-pay | Admitting: Physician Assistant

## 2021-02-13 ENCOUNTER — Other Ambulatory Visit: Payer: Self-pay | Admitting: Physician Assistant

## 2021-02-13 DIAGNOSIS — E559 Vitamin D deficiency, unspecified: Secondary | ICD-10-CM

## 2021-02-13 DIAGNOSIS — M1612 Unilateral primary osteoarthritis, left hip: Secondary | ICD-10-CM | POA: Diagnosis not present

## 2021-02-13 DIAGNOSIS — Z96652 Presence of left artificial knee joint: Secondary | ICD-10-CM | POA: Diagnosis not present

## 2021-02-18 ENCOUNTER — Other Ambulatory Visit: Payer: Self-pay | Admitting: Rheumatology

## 2021-03-01 ENCOUNTER — Other Ambulatory Visit: Payer: Self-pay | Admitting: Physician Assistant

## 2021-03-01 DIAGNOSIS — F4323 Adjustment disorder with mixed anxiety and depressed mood: Secondary | ICD-10-CM

## 2021-03-05 DIAGNOSIS — M1612 Unilateral primary osteoarthritis, left hip: Secondary | ICD-10-CM | POA: Diagnosis not present

## 2021-03-06 NOTE — Progress Notes (Signed)
Office Visit Note  Patient: Marissa Larson             Date of Birth: 1955-02-15           MRN: [id number]             PCP: Mayer MaskerAbonza, Maritza, PA-C Referring: Mayer MaskerAbonza, Maritza, PA-C Visit Date: 03/07/2021 Occupation: @GUAROCC @  Subjective:  Pain in both wrist joints   History of Present Illness: Marissa Larson is a 66 y.o. female with history of inflammatory arthritis, pseudogout, and osteoarthritis.  The patient was last seen in the office on 07/04/2020.  At that time she decided against restarting on Plaquenil.  She has been taking meloxicam 7.5 mg 1 tablet at bedtime which has been managing her symptoms.  Up until this past month she has not had any flares since her last office visit.  In February she started to have increased pain and swelling in both wrist joints and for 4 to 5 days took meloxicam twice daily for symptomatic relief.  The pain and inflammation in her wrist has been improving gradually.  Patient reports that she has been having some increased discomfort in the left knee replacement.  [facility] and is planning on proceeding with physical therapy/working with a personal trainer for lower extremity muscle strengthening.  She continues to have chronic discomfort in her lower back.  No symptoms of radiculopathy at this time.   Activities of Daily Living:  Patient reports morning stiffness for 0 minutes.   Patient Reports nocturnal pain.  Difficulty dressing/grooming: Denies Difficulty climbing stairs: Denies Difficulty getting out of chair: Denies Difficulty using hands for taps, buttons, cutlery, and/or writing: Reports  Review of Systems  Constitutional:  Positive for fatigue.  HENT:  Negative for mouth sores, mouth dryness and nose dryness.   Eyes:  Negative for pain, itching and dryness.  Respiratory:  Negative for shortness of breath and difficulty breathing.   Cardiovascular:  Negative for chest pain and palpitations.  Gastrointestinal:   Negative for blood in stool, constipation and diarrhea.  Endocrine: Negative for increased urination.  Genitourinary:  Negative for difficulty urinating.  Musculoskeletal:  Positive for joint pain and joint pain. Negative for joint swelling, myalgias, morning stiffness, muscle tenderness and myalgias.  Skin:  Positive for color change. Negative for rash and redness.  Allergic/Immunologic: Negative for susceptible to infections.  Neurological:  Negative for dizziness, numbness, headaches, memory loss and weakness.  Hematological:  Negative for bruising/bleeding tendency.  Psychiatric/Behavioral:  Negative for confusion.    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   Obesity, Class I, BMI 30-34.9 12/03/2016   Kidney stones 09/12/2016   Nausea 09/12/2016   High risk medication use 07/30/2016   NAFLD (nonalcoholic fatty liver disease) 40/98/119107/25/2018   Primary osteoarthritis of both feet 04/12/2016   History of kidney stones 04/12/2016   Primary osteoarthritis of both hands 04/11/2016   Bilateral primary osteoarthritis of hip 04/11/2016   Primary osteoarthritis  of both knees 04/11/2016   Spondylosis of lumbar region without myelopathy or radiculopathy 04/11/2016   Hyperuricemia 04/11/2016   Primary insomnia 04/11/2016   Flank pain 04/02/2016   Sinusitis 04/02/2016   Elevated HDL 11/11/2015   Elevated LFTs 11/11/2015   Adjustment disorder  with mixed anxiety and depressed mood 11/11/2015   Dysuria 11/11/2015   Hypothyroidism 10/14/2015   Pseudogout involving multiple joints 10/14/2015   Vitamin D deficiency 10/14/2015   Hormone replacement therapy- per GYN 10/14/2015   HTN (hypertension) 10/14/2015   Fatigue 10/14/2015   h/o Hiatal hernia 10/14/2015   Sleep difficulties 10/14/2015   Generalized OA 10/14/2015   Counseling on health promotion and disease prevention 10/14/2015   Gastric ulcer- due to Mobic 10/09/2015   Age-related nuclear cataract of both eyes 01/25/2015   Posterior vitreous detachment of left eye 01/25/2015   chronic Palpitations 07/19/2013    Past Medical History:  Diagnosis Date   Arthritis    Chest pain 06/2013   Left sided   Chronic leg pain 06/2013   Complication of anesthesia    Dysrhythmia    Fatigue    GERD (gastroesophageal reflux disease)    History of kidney stones    Hypertension    Hypothyroidism    Nonproductive cough 06/2013   Overweight 01/18/2014   Peripheral neuropathy    PONV (postoperative nausea and vomiting)    Sleep difficulties    Change in sleep patterns   SOB (shortness of breath) 06/2013   Tachycardia 06/2013   Thyroid disease    hypo   UTI (lower urinary tract infection)    Vesico-ureteric reflux     Family History  Problem Relation Age of Onset   Migraines Other        Fam Hx    Heart disease Other        Fam Hx   Thyroid disease Other        Fam Hx - She also has thyroid disease   Diabetes Other        Fam Hx   Arthritis Other        Fam Hx   Gallbladder disease Other        Fam Hx   Cancer Other        Fam Hx - Pancreatic   Hypertension Other        Fam Hx   Heart attack Cousin        Multiple cousins with MI's before 9 years of age   Heart disease Mother 44   Hyperlipidemia Mother 1   Hypertension Mother 13   Stroke Mother 69   Heart disease Maternal Grandmother    Cancer Father 80       pancreatic   Diabetes Brother 50   Heart attack  Maternal Uncle    Heart attack Paternal Aunt    Past Surgical History:  Procedure Laterality Date   ABDOMINAL SURGERY  1994   CYSTOSCOPY/URETEROSCOPY/HOLMIUM LASER/STENT PLACEMENT Left 09/22/2017   Procedure: CYSTOSCOPY/RETROGRADE/URETEROSCOPY/ BASKET STONE EXTRACTION/STENT PLACEMENT;  Surgeon: Rene Paci, MD;  Location: [facility];  Service: Urology;  Laterality: Left;  ONLY NEEDS 60 MIN   FOOT SURGERY Right 2009   KIDNEY SURGERY Left 1993   KNEE ARTHROPLASTY Right 11/02/2019   KNEE SURGERY     Multiple knee surgeries   OOPHORECTOMY Left 1994   REPLACEMENT TOTAL KNEE Left 10/13/2018   Dr. Thurston Hole   small fiber neuropathy biopsy  12/2017   TONSILLECTOMY     URETEROSCOPY VIA  URETEROSTOMY Left 1994   Social History   Social History Narrative   Not on file   Immunization History  Administered Date(s) Administered   Tdap 01/07/2011     Objective: Vital Signs: BP (!) 165/87 (BP Location: Left Arm, Patient Position: Sitting, Cuff Size: Normal)    Pulse 72    Ht 5\' 6"  (1.676 m)    Wt 195 lb 12.8 oz (88.8 kg)    BMI 31.60 kg/m    Physical Exam Vitals and nursing note reviewed.  Constitutional:      Appearance: She is well-developed.  HENT:     Head: Normocephalic and atraumatic.  Eyes:     Conjunctiva/sclera: Conjunctivae normal.  Cardiovascular:     Rate and Rhythm: Normal rate and regular rhythm.     Heart sounds: Normal heart sounds.  Pulmonary:     Effort: Pulmonary effort is normal.     Breath sounds: Normal breath sounds.  Abdominal:     General: Bowel sounds are normal.     Palpations: Abdomen is soft.  Musculoskeletal:     Cervical back: Normal range of motion.  Skin:    General: Skin is warm and dry.     Capillary Refill: Capillary refill takes less than 2 seconds.  Neurological:     Mental Status: She is alert and oriented to person, place, and time.  Psychiatric:        Behavior: Behavior normal.     Musculoskeletal Exam: C-spine has slightly  limited range of motion with lateral rotation.  No midline spinal tenderness.  Some tenderness over both SI joints.  Shoulder joints and elbow joints have good range of motion.  Slightly limited range of motion of both wrist joints.  Synovial thickening and tenderness over the ulnar aspect of both wrists.  No tenderness or synovitis over MCP joints.  PIP and DIP thickening consistent with osteoarthritis of both hands.  Hip joints have slightly limited range of motion with no groin pain.  Both knee replacements have good range of motion with no warmth.  Ankle joints have good range of motion with no tenderness or joint swelling.  CDAI Exam: CDAI Score: -- Patient Global: --; Provider Global: -- Swollen: --; Tender: -- Joint Exam 03/07/2021   No joint exam has been documented for this visit   There is currently no information documented on the homunculus. Go to the Rheumatology activity and complete the homunculus joint exam.  Investigation: No additional findings.  Imaging: No results found.  Recent Labs: Lab Results  Component Value Date   WBC 8.4 12/11/2020   HGB 15.1 12/11/2020   PLT 280 12/11/2020   NA 142 12/11/2020   K 4.3 12/11/2020   CL 102 12/11/2020   CO2 28 12/11/2020   GLUCOSE 102 (H) 12/11/2020   BUN 15 12/11/2020   CREATININE 0.64 12/11/2020   BILITOT 0.4 12/11/2020   ALKPHOS 105 12/11/2020   AST 34 12/11/2020   ALT 28 12/11/2020   PROT 7.0 12/11/2020   ALBUMIN 4.8 12/11/2020   CALCIUM 10.1 12/11/2020   GFRAA 108 08/17/2019   QFTBGOLDPLUS NEGATIVE 02/16/2017    Speciality Comments: PLQ eye exam: 04/20/2019 normal. Dr. Burgess Estelle. Follow up in 1 year.  Procedures:  No procedures performed Allergies: Penicillins and Sulfa antibiotics   Assessment / Plan:     Visit Diagnoses: Inflammatory arthritis -She presents today with some increased discomfort and stiffness in both wrist joints.  She has thickening and tenderness over the ulnar aspect of both wrist  on  examination today.  No synovitis was noted.  She has been taking meloxicam 7.5 mg 1 tablet at bedtime daily.  She had a flare in February at which time she increased meloxicam to 7.5 mg twice daily for about 5 days which alleviated her symptoms.  At her last office visit on 07/04/2020 the plan was to restart on Plaquenil but the patient decided to see how she would do on meloxicam as monotherapy.  The patient did not have a flare from July until February.  She would like to remain on meloxicam as prescribed.  Refill meloxicam was sent to the pharmacy today.  She was advised to notify us if she develops signs or symptoms of a flare.  She will follow-up in the office in 6 months.  High risk medication use -She is taking meloxicam 7.5 mg 1 tablet at bedtime.  CBC and CMP updated on 12/11/2020.  She will be having updated lab work with her PCP in April 2023.  Future orders for CBC and CMP were placed today.  Previously treated with Plaquenil 200 mg twice daily Monday through Friday. PLQ eye exam: 04/20/2019.  - Plan: COMPLETE METABOLIC PANEL WITH GFR, CBC with Differential/Platelet  Pseudogout involving multiple joints: Discontinued colchicine in October 2021.  She has been taking meloxicam 7.5 mg 1 tablet daily for symptomatic relief.  She was advised to notify us if she develops signs or symptoms of a flare.  Primary osteoarthritis of both hands: PIP and DIP thickening consistent with osteoarthritis of both hands.  She has some tenderness and thickening over the ulnar aspect of both wrist.  No synovitis was noted on examination today.  She is able to make a complete fist bilaterally.  Discussed the importance of joint protection and muscle strengthening.  Bilateral primary osteoarthritis of hip: She has limited range of motion of both hip joints on examination today.  No groin pain currently.  S/P total knee arthroplasty, right - Performed by Dr. Thurston [facility]ainer.  Doing well.  She has good ROM with no discomfort or  warmth.   S/P total knee replacement, left - She had surgery by Dr. Thurston HoleWainer in 2020.  She has been experiencing some increased discomfort in the left knee replacement.  [facility]  She will be starting physical therapy/working with a personal trainer to work on lower extremity muscle strengthening.  Primary osteoarthritis of both feet: She is not experiencing any increased discomfort in her feet at this time.  She has good range of motion of both ankle joints with no tenderness or joint swelling.  She has PIP and DIP thickening consistent with osteoarthritis of both feet.  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  She has no midline spinal tenderness on examination today.  She has tenderness over both SI joints.  [facility].  She plans on starting physical therapy and working with a Systems analystpersonal trainer.  Other medical conditions are listed as follows:   Hyperuricemia: She has not had any signs or symptoms of gout.  Small fiber neuropathy  Essential hypertension  NAFLD (nonalcoholic fatty liver disease)  History of kidney stones  Vitamin D deficiency  h/o Hiatal hernia  Primary insomnia  Orders: Orders Placed This Encounter  Procedures   COMPLETE METABOLIC PANEL WITH GFR   CBC with Differential/Platelet   Meds ordered this encounter  Medications   meloxicam (MOBIC) 7.5 MG tablet    Sig: Take 1  tablet (7.5 mg total) by mouth daily.    Dispense:  30 tablet    Refill:  2   Follow-Up Instructions: Return in about 6 months (around 09/07/2021) for Inflammatory arthritis, pseudogout.   Gearldine Bienenstock, PA-C  Note - This record has been created using Dragon software.  Chart creation errors have been sought, but may not always  have been located. Such creation errors do not reflect on  the standard of medical care.

## 2021-03-07 ENCOUNTER — Encounter: Payer: Self-pay | Admitting: Physician Assistant

## 2021-03-07 ENCOUNTER — Other Ambulatory Visit: Payer: Self-pay

## 2021-03-07 ENCOUNTER — Ambulatory Visit (INDEPENDENT_AMBULATORY_CARE_PROVIDER_SITE_OTHER): Payer: BC Managed Care – PPO | Admitting: Physician Assistant

## 2021-03-07 VITALS — BP 165/87 | HR 72 | Ht 66.0 in | Wt 195.8 lb

## 2021-03-07 DIAGNOSIS — M19041 Primary osteoarthritis, right hand: Secondary | ICD-10-CM | POA: Diagnosis not present

## 2021-03-07 DIAGNOSIS — I10 Essential (primary) hypertension: Secondary | ICD-10-CM

## 2021-03-07 DIAGNOSIS — F5101 Primary insomnia: Secondary | ICD-10-CM

## 2021-03-07 DIAGNOSIS — Z96652 Presence of left artificial knee joint: Secondary | ICD-10-CM

## 2021-03-07 DIAGNOSIS — Z79899 Other long term (current) drug therapy: Secondary | ICD-10-CM

## 2021-03-07 DIAGNOSIS — M19072 Primary osteoarthritis, left ankle and foot: Secondary | ICD-10-CM

## 2021-03-07 DIAGNOSIS — M1990 Unspecified osteoarthritis, unspecified site: Secondary | ICD-10-CM

## 2021-03-07 DIAGNOSIS — E790 Hyperuricemia without signs of inflammatory arthritis and tophaceous disease: Secondary | ICD-10-CM

## 2021-03-07 DIAGNOSIS — M19042 Primary osteoarthritis, left hand: Secondary | ICD-10-CM

## 2021-03-07 DIAGNOSIS — M160 Bilateral primary osteoarthritis of hip: Secondary | ICD-10-CM

## 2021-03-07 DIAGNOSIS — M1189 Other specified crystal arthropathies, multiple sites: Secondary | ICD-10-CM | POA: Diagnosis not present

## 2021-03-07 DIAGNOSIS — K449 Diaphragmatic hernia without obstruction or gangrene: Secondary | ICD-10-CM

## 2021-03-07 DIAGNOSIS — M19071 Primary osteoarthritis, right ankle and foot: Secondary | ICD-10-CM

## 2021-03-07 DIAGNOSIS — Z87442 Personal history of urinary calculi: Secondary | ICD-10-CM

## 2021-03-07 DIAGNOSIS — Z96651 Presence of right artificial knee joint: Secondary | ICD-10-CM

## 2021-03-07 DIAGNOSIS — G629 Polyneuropathy, unspecified: Secondary | ICD-10-CM

## 2021-03-07 DIAGNOSIS — M47816 Spondylosis without myelopathy or radiculopathy, lumbar region: Secondary | ICD-10-CM

## 2021-03-07 DIAGNOSIS — E559 Vitamin D deficiency, unspecified: Secondary | ICD-10-CM

## 2021-03-07 DIAGNOSIS — K760 Fatty (change of) liver, not elsewhere classified: Secondary | ICD-10-CM

## 2021-03-07 MED ORDER — MELOXICAM 7.5 MG PO TABS: 7.5000 mg | ORAL_TABLET | Freq: Every day | ORAL | 2 refills | Status: DC

## 2021-03-11 ENCOUNTER — Other Ambulatory Visit: Payer: Self-pay | Admitting: Physician Assistant

## 2021-03-11 DIAGNOSIS — F5104 Psychophysiologic insomnia: Secondary | ICD-10-CM

## 2021-03-20 ENCOUNTER — Ambulatory Visit: Payer: BC Managed Care – PPO | Admitting: Internal Medicine

## 2021-03-25 ENCOUNTER — Encounter: Payer: Self-pay | Admitting: Physician Assistant

## 2021-03-26 ENCOUNTER — Telehealth: Payer: Self-pay | Admitting: Physician Assistant

## 2021-03-26 NOTE — Telephone Encounter (Signed)
Patient has an appointment to see you tomorrow however she states she feels like she needs to be put on something now before her cough/congestions turns into something worse. Please advise. [phone] ?

## 2021-03-26 NOTE — Telephone Encounter (Signed)
Patient is aware 

## 2021-03-27 ENCOUNTER — Other Ambulatory Visit: Payer: Self-pay

## 2021-03-27 ENCOUNTER — Encounter: Payer: Self-pay | Admitting: Physician Assistant

## 2021-03-27 ENCOUNTER — Ambulatory Visit (INDEPENDENT_AMBULATORY_CARE_PROVIDER_SITE_OTHER): Payer: BC Managed Care – PPO | Admitting: Physician Assistant

## 2021-03-27 VITALS — BP 122/78 | HR 68 | Temp 98.0°F | Ht 66.0 in | Wt 190.0 lb

## 2021-03-27 DIAGNOSIS — J40 Bronchitis, not specified as acute or chronic: Secondary | ICD-10-CM

## 2021-03-27 MED ORDER — HYDROCOD POLI-CHLORPHE POLI ER 10-8 MG/5ML PO SUER: 5.0000 mL | Freq: Every evening | ORAL | 0 refills | Status: DC | PRN

## 2021-03-27 MED ORDER — PREDNISONE 20 MG PO TABS: ORAL_TABLET | ORAL | 0 refills | Status: DC

## 2021-03-27 NOTE — Progress Notes (Signed)
?Established patient acute visit ? ? ?Patient: Marissa Larson   DOB: 1955-01-07   66 y.o. Female  MRN: [id number] ?Visit Date: 03/27/2021 ? ?Chief Complaint  ?Patient presents with  ? Acute Visit  ? Cough  ? Sore Throat  ? Diarrhea  ? Generalized Body Aches  ? ?Subjective  ?  ?HPI  ?Patient presents with c/o chest congestion, fatigue, cough, sore throat, low-grade fever and diarrhea. Symptoms started 4 days ago. Denies nausea, vomiting, body aches, sinus pressure, headache, or shortness of breath. Mild wheezing when laying down. Cough is worse at night and has difficulty time sleeping. Has tried [employer], coricidin cough syrup which have provided mild relief.  ? ? ?Medications: ?Outpatient Medications Prior to Visit  ?Medication Sig  ? CREAM BASE EX Apply 1 application topically daily. BIEST Transdermal Cream 0.5 mg: Compounded bio-identical Estriol (E3) combined by [place]  ? escitalopram (LEXAPRO) 20 MG tablet TAKE 1 TABLET BY MOUTH DAILY.  ? eszopiclone (LUNESTA) 1 MG TABS tablet Take 1 mg by mouth at bedtime as needed.  ? famotidine (PEPCID) 40 MG tablet Take 40 mg by mouth daily as needed (for heartburn/indigestion.).  ? Flaxseed, Linseed, (FLAX SEED OIL) 1000 MG CAPS Take 2,000 mg by mouth every evening.  ? fluticasone (FLONASE) 50 MCG/ACT nasal spray Place 2 sprays into both nostrils daily. (Patient not taking: Reported on 03/07/2021)  ? gabapentin (NEURONTIN) 300 MG capsule  (Patient not taking: Reported on 03/07/2021)  ? hydrOXYzine (ATARAX) 25 MG tablet TAKE 1 TABLET (25 MG TOTAL) BY MOUTH AT BEDTIME AS NEEDED.  ? Magnesium 500 MG CAPS Take 1,000 mg by mouth every evening.  ? meloxicam (MOBIC) 7.5 MG tablet Take 1 tablet (7.5 mg total) by mouth daily.  ? NONFORMULARY OR COMPOUNDED ITEM Take 150 mg by mouth at bedtime. Bio identical Progesterone 150 ([facility])  ? Omega-3 Fatty Acids (FISH OIL) 1200 MG CAPS Take 2,400 mg by mouth every evening.  ? omeprazole (PRILOSEC) 40 MG  capsule Take by mouth as needed.  ? SYNTHROID 100 MCG tablet Take 1 tablet (100 mcg total) by mouth daily before breakfast. (Patient taking differently: Take 112 mcg by mouth daily before breakfast.)  ? Vitamin D, Ergocalciferol, (DRISDOL) 1.25 MG (50000 UNIT) CAPS capsule TAKE 1 CAPSULE BY MOUTH EVERY 7 DAYS.  ? [DISCONTINUED] benzonatate (TESSALON PERLES) 100 MG capsule Take 2 capsules (200 mg total) by mouth 3 (three) times daily as needed for cough. (Patient not taking: Reported on 03/07/2021)  ? [DISCONTINUED] erythromycin ophthalmic ointment Place 1 application into both eyes 4 (four) times daily. (Patient not taking: Reported on 03/07/2021)  ? [DISCONTINUED] methylPREDNISolone (MEDROL DOSEPAK) 4 MG TBPK tablet Take as directed on package. (Patient not taking: Reported on 03/07/2021)  ? ?No facility-administered medications prior to visit.  ? ? ?Review of Systems ?Review of Systems:  ?A fourteen system review of systems was performed and found to be positive as per HPI. ? ? ?  Objective  ?  ?BP 122/78   Pulse 68   Temp 98 ?F (36.7 ?C)   Ht 5\' 6"  (1.676 m)   Wt 190 lb (86.2 kg)   SpO2 98%   BMI 30.67 kg/m?  ? ? ?Physical Exam ?Constitutional:   ?   General: She is not in acute distress. ?   Appearance: She is ill-appearing. She is not toxic-appearing.  ?HENT:  ?   Head: Normocephalic and atraumatic.  ?   Right Ear: Tympanic membrane and ear canal normal.  No swelling. Tympanic membrane is not erythematous.  ?   Left Ear: Tympanic membrane and ear canal normal. No swelling. Tympanic membrane is not erythematous.  ?   Nose: No congestion.  ?   Mouth/Throat:  ?   Mouth: Mucous membranes are moist.  ?   Pharynx: Posterior oropharyngeal erythema present. No oropharyngeal exudate.  ?   Tonsils: No tonsillar exudate.  ?Eyes:  ?   Conjunctiva/sclera: Conjunctivae normal.  ?Cardiovascular:  ?   Rate and Rhythm: Normal rate and regular rhythm.  ?   Heart sounds: Normal heart sounds.  ?Pulmonary:  ?   Effort: Pulmonary  effort is normal. No respiratory distress.  ?   Breath sounds: Rhonchi present. No wheezing or rales.  ?Musculoskeletal:  ?   Cervical back: Normal range of motion and neck supple.  ?Lymphadenopathy:  ?   Cervical: Cervical adenopathy present.  ?Skin: ?   General: Skin is warm and dry.  ?Neurological:  ?   General: No focal deficit present.  ?   Mental Status: She is alert.  ?Psychiatric:     ?   Mood and Affect: Mood normal.     ?   Behavior: Behavior normal.  ?  ? ? ?No results found for any visits on 03/27/21. ? Assessment & Plan  ?  ? ?Patient declined Covid or Influenza testing. Discussed with patient has s/sx suggestive of bronchitis likely viral. Will start corticosteroid therapy and advised can take Tussionex at bedtime. Continue home supportive care. If symptoms fail to improve or worsen recommend further evaluation with chest x-ray.  ? ? ?Return if symptoms worsen or fail to improve.  ?   ? ? ? ?Lorrene Reid, PA-C  ?[facility] ?[phone] (phone) ?[phone] (fax) ? ?[facility] ?

## 2021-03-27 NOTE — Patient Instructions (Signed)
Acute Bronchitis, Adult ?Acute bronchitis is when air tubes in the lungs (bronchi) suddenly get swollen. The condition can make it hard for you to breathe. In adults, acute bronchitis usually goes away within 2 weeks. A cough caused by bronchitis may last up to 3 weeks. Smoking, allergies, and asthma can make the condition worse. ?What are the causes? ?Germs that cause cold and flu (viruses). The most common cause of this condition is the virus that causes the common cold. ?Bacteria. ?Substances that bother (irritate) the lungs, including: ?Smoke from cigarettes and other types of tobacco. ?Dust and pollen. ?Fumes from chemicals, gases, or burned fuel. ?Indoor or outdoor air pollution. ?What increases the risk? ?A weak body's defense system. This is also called the immune system. ?Any condition that affects your lungs and breathing, such as asthma. ?What are the signs or symptoms? ?A cough. ?Coughing up clear, yellow, or green mucus. ?Making high-pitched whistling sounds when you breathe, most often when you breathe out (wheezing). ?Runny or stuffy nose. ?Having too much mucus in your lungs (chest congestion). ?Shortness of breath. ?Body aches. ?A sore throat. ?How is this treated? ?Acute bronchitis may go away over time without treatment. Your doctor may tell you to: ?Drink more fluids. This will help thin your mucus so it is easier to cough up. ?Use a device that gets medicine into your lungs (inhaler). ?Use a vaporizer or a humidifier. These are machines that add water to the air. This helps with coughing and poor breathing. ?Take a medicine that thins mucus and helps clear it from your lungs. ?Take a medicine that prevents or stops coughing. ?It is not common to take an antibiotic medicine for this condition. ?Follow these instructions at home: ? ?Take over-the-counter and prescription medicines only as told by your doctor. ?Use an inhaler, vaporizer, or humidifier as told by your doctor. ?Take two teaspoons (10  mL) of honey at bedtime. This helps lessen your coughing at night. ?Drink enough fluid to keep your pee (urine) pale yellow. ?Do not smoke or use any products that contain nicotine or tobacco. If you need help quitting, ask your doctor. ?Get a lot of rest. ?Return to your normal activities when your doctor says that it is safe. ?Keep all follow-up visits. ?How is this prevented? ? ?Wash your hands often with soap and water for at least 20 seconds. If you cannot use soap and water, use hand sanitizer. ?Avoid contact with people who have cold symptoms. ?Try not to touch your mouth, nose, or eyes with your hands. ?Avoid breathing in smoke or chemical fumes. ?Make sure to get the flu shot every year. ?Contact a doctor if: ?Your symptoms do not get better in 2 weeks. ?You have trouble coughing up the mucus. ?Your cough keeps you awake at night. ?You have a fever. ?Get help right away if: ?You cough up blood. ?You have chest pain. ?You have very bad shortness of breath. ?You faint or keep feeling like you are going to faint. ?You have a very bad headache. ?Your fever or chills get worse. ?These symptoms may be an emergency. Get help right away. Call your local emergency services (911 in the [place] ?Do not wait to see if the symptoms will go away. ?Do not drive yourself to the hospital. ?Summary ?Acute bronchitis is when air tubes in the lungs (bronchi) suddenly get swollen. In adults, acute bronchitis usually goes away within 2 weeks. ?Drink more fluids. This will help thin your mucus so it is easier to   cough up. ?Take over-the-counter and prescription medicines only as told by your doctor. ?Contact a doctor if your symptoms do not improve after 2 weeks of treatment. ?This information is not intended to replace advice given to you by your health care provider. Make sure you discuss any questions you have with your health care provider. ?Document Revised: 04/25/2020 Document Reviewed: 04/25/2020 ?Elsevier Patient Education  ? 2022 [employer] ? ?

## 2021-04-01 DIAGNOSIS — F419 Anxiety disorder, unspecified: Secondary | ICD-10-CM | POA: Diagnosis not present

## 2021-04-01 DIAGNOSIS — I10 Essential (primary) hypertension: Secondary | ICD-10-CM | POA: Diagnosis not present

## 2021-04-01 DIAGNOSIS — Z131 Encounter for screening for diabetes mellitus: Secondary | ICD-10-CM | POA: Diagnosis not present

## 2021-04-01 DIAGNOSIS — E559 Vitamin D deficiency, unspecified: Secondary | ICD-10-CM | POA: Diagnosis not present

## 2021-04-01 DIAGNOSIS — R5383 Other fatigue: Secondary | ICD-10-CM | POA: Diagnosis not present

## 2021-04-01 DIAGNOSIS — R790 Abnormal level of blood mineral: Secondary | ICD-10-CM | POA: Diagnosis not present

## 2021-04-01 DIAGNOSIS — E538 Deficiency of other specified B group vitamins: Secondary | ICD-10-CM | POA: Diagnosis not present

## 2021-04-01 DIAGNOSIS — E039 Hypothyroidism, unspecified: Secondary | ICD-10-CM | POA: Diagnosis not present

## 2021-04-01 DIAGNOSIS — Z13828 Encounter for screening for other musculoskeletal disorder: Secondary | ICD-10-CM | POA: Diagnosis not present

## 2021-04-04 ENCOUNTER — Encounter: Payer: Self-pay | Admitting: Physician Assistant

## 2021-04-08 ENCOUNTER — Encounter: Payer: Self-pay | Admitting: Internal Medicine

## 2021-04-08 ENCOUNTER — Encounter: Payer: Self-pay | Admitting: Physician Assistant

## 2021-04-08 ENCOUNTER — Telehealth: Payer: Self-pay | Admitting: Physician Assistant

## 2021-04-08 ENCOUNTER — Ambulatory Visit (INDEPENDENT_AMBULATORY_CARE_PROVIDER_SITE_OTHER): Payer: BC Managed Care – PPO | Admitting: Internal Medicine

## 2021-04-08 ENCOUNTER — Other Ambulatory Visit (INDEPENDENT_AMBULATORY_CARE_PROVIDER_SITE_OTHER): Payer: BC Managed Care – PPO | Admitting: Physician Assistant

## 2021-04-08 VITALS — BP 142/100 | HR 75 | Ht 66.0 in | Wt 191.0 lb

## 2021-04-08 DIAGNOSIS — R7303 Prediabetes: Secondary | ICD-10-CM | POA: Diagnosis not present

## 2021-04-08 DIAGNOSIS — N3090 Cystitis, unspecified without hematuria: Secondary | ICD-10-CM

## 2021-04-08 DIAGNOSIS — R300 Dysuria: Secondary | ICD-10-CM | POA: Diagnosis not present

## 2021-04-08 DIAGNOSIS — E039 Hypothyroidism, unspecified: Secondary | ICD-10-CM

## 2021-04-08 LAB — POCT URINALYSIS DIPSTICK
Bilirubin, UA: NEGATIVE
Blood, UA: NEGATIVE
Glucose, UA: NEGATIVE
Ketones, UA: NEGATIVE
Nitrite, UA: NEGATIVE
Protein, UA: NEGATIVE
Spec Grav, UA: 1.025 (ref 1.010–1.025)
Urobilinogen, UA: 0.2 E.U./dL
pH, UA: 6 (ref 5.0–8.0)

## 2021-04-08 LAB — T4, FREE: Free T4: 1.15 ng/dL (ref 0.60–1.60)

## 2021-04-08 LAB — TSH: TSH: 0.28 u[IU]/mL — ABNORMAL LOW (ref 0.35–5.50)

## 2021-04-08 LAB — HEMOGLOBIN A1C: Hgb A1c MFr Bld: 5.9 % (ref 4.6–6.5)

## 2021-04-08 NOTE — Telephone Encounter (Signed)
Patient is requesting to come leave a urine sample because she thinks she has a UTI and wants to know if she can do this? Please advise. [phone] ?

## 2021-04-08 NOTE — Telephone Encounter (Signed)
lmtc

## 2021-04-08 NOTE — Telephone Encounter (Signed)
Patient scheduled to come in

## 2021-04-08 NOTE — Patient Instructions (Addendum)
Please stop at the lab.  Please continue Synthroid 112 mcg daily.  Take the thyroid hormone every day, with water, at least 30 minutes before breakfast, separated by at least 4 hours from: - acid reflux medications - calcium - iron - multivitamins  Please come back for a follow-up appointment in 6 months.  

## 2021-04-08 NOTE — Progress Notes (Signed)
Patient ID: Marissa Larson, female   DOB: 08/07/1955, 66 y.o.   MRN: [id number]  ? ?This visit occurred during the SARS-CoV-2 public health emergency.  Safety protocols were in place, including screening questions prior to the visit, additional usage of staff PPE, and extensive cleaning of exam room while observing appropriate contact time as indicated for disinfecting solutions.  ? ?HPI  ?Marissa Larson is a 66 y.o.-year-old femaleemale, initially referred by her PCP, Dr. Sharee Holster, returning for follow-up for hypothyroidism and prediabetes.  She previously saw Dr. Leslie Dales from 2017-2019.   ?Latest visit with me 7 months ago.  Her husband is also my patient. ? ?Interim history: ?She had diarrhea which improved before last visit. She had extensive investigation for this >> believed to have been 2/2 Meloxicam. ?She is on a mostly plant-based diet (Dr. Dutch Quint), like her husband. She is on a Polyphenol supplement  - helped her arthritis. ?At last visit, she was complaining of more heat intolerance >> improved.  She still has insomnia >> but improved. ?She has seasonal allergies -has some cough and congestion today. ?She recently saw an integrative medicine clinic and had labs but she does not have the results yet. ? ?Hypothyroidism  ?-Diagnosed at 75-21 y/o, when she presented with several symptoms including fatigue  ? ?At last visit, we decreased her Synthroid from 112 to 100 mcg daily (09/2020).  However, in 12/2020, as she felt more tired on this dose, she increased the dose back to 112 mcg daily.  She takes this: ?- in am ?- fasting ?- at least 30 min from b'fast ?- no calcium ?- no iron ?- no multivitamins ?- no PPIs, but takes Pepcid or Tums - at night - prn ?- not on Biotin ? ?Reviewed her TFTs: ?Lab Results  ?Component Value Date  ? TSH 0.421 (L) 12/11/2020  ? TSH 0.27 (L) 09/14/2020  ? TSH 0.192 (L) 08/08/2020  ? TSH 0.44 03/09/2020  ? TSH 1.300 08/17/2019  ? TSH 1.17 05/19/2019  ? TSH 1.700 06/17/2018  ? TSH 0.486  06/10/2017  ? TSH 0.61 02/20/2017  ? TSH 0.409 (L) 12/08/2016  ? FREET4 1.61 12/11/2020  ? FREET4 0.94 09/14/2020  ? FREET4 1.55 08/08/2020  ? FREET4 1.10 03/09/2020  ? FREET4 1.16 05/19/2019  ? FREET4 1.37 06/17/2018  ? FREET4 1.66 06/10/2017  ? FREET4 1.4 10/10/2015  ? FREET4 1.55 04/27/2015  ? T3FREE 3.1 06/17/2018  ? T3FREE 2.9 06/10/2017  ? T3FREE 3.2 10/10/2015  ? ?Per Dr. Altheimer's records: ?She was followed by an endocrinologist in [place] in about 1982-2012 prior to relocating here. ?TSH 1.680 on 09/29/13 on levothyroxine 112 mcg daily. ?TSH 1.450 on 03/23/14 on levothyroxine 112 mcg daily. ?TSH 1.450 on 07/21/14 on levothyroxine 112 mcg daily. ?Thyroid peroxidase (anti-TPO) antibody negative and thyroglobulin antibody negative in 3/17. ?FT4 1.59 (0.82-1.77), TSH 0.338 (0.450-4.500) in 3/17 on levothyroxine 112 mcg daily. ?FT4 1.55 (0.82-1.77), TSH 0.753 (0.450-4.500) in 4/17 on levothyroxine 112 mcg daily. ?FT4 1.45 (0.82-1.77), TSH 0.846 (0.450-4.500) in 10/17 on levothyroxine 112 mcg daily. ?FT4 1.0 (0.6-1.4), TSH 1.492 (0.450-5.330) in 6/18 on levothyroxine 112 mcg daily. ?FT4 1.1 (0.6-1.4), TSH 0.605 (0.450-5.330) in 2/19 on levothyroxine 112 mcg daily. ?FT4 0.9 (0.6-1.4), TSH 0.486 (0.450-5.330) in 10/19 on levothyroxine 112 mcg daily. ? ?Pt denies: ?- feeling nodules in neck ?- hoarseness ?- dysphagia ?- choking ?- SOB with lying down ? ?She has + FH of thyroid disorders in: Mother (thyroidectomy for goiter) and sister, M aunt (thyroidectomy for goiter).  No family history of thyroid cancer. ?No history of radiation therapy to head or neck.  No recent use of iodine supplements.  Not on biotin. ? ?Mild prediabetes: ? ?Reviewed HbA1c levels: ?Lab Results  ?Component Value Date  ? HGBA1C 5.9 (H) 12/11/2020  ? HGBA1C 5.7 (H) 08/08/2020  ? HGBA1C 5.5 08/17/2019  ? HGBA1C 5.5 05/19/2019  ? HGBA1C 5.7 (H) 06/17/2018  ? HGBA1C 5.7 (H) 06/10/2017  ? HGBA1C 5.5 12/08/2016  ? HGBA1C 5.5 10/10/2015   ?03/09/2020: HbA1c 5.8% ? ?She is not on any medications for her prediabetes. ? ?She is not checking sugars. ? ?No CKD: ?Lab Results  ?Component Value Date  ? BUN 15 12/11/2020  ? BUN 12 08/08/2020  ? ?Lab Results  ?Component Value Date  ? CREATININE 0.64 12/11/2020  ? CREATININE 0.53 (L) 08/08/2020  ? ?+ HL; latest lipids: ?Lab Results  ?Component Value Date  ? CHOL 220 (H) 12/11/2020  ? HDL 86 12/11/2020  ? LDLCALC 113 (H) 12/11/2020  ? TRIG 120 12/11/2020  ? CHOLHDL 2.6 12/11/2020  ?On omega-3 fatty acids and flaxseed oil. ? ?Last eye exam: 04/2019 - No DR.  ? ?She has family history of diabetes in brother. ?She has fatty liver - lost weight (20 lbs) 3 years ago, but gained some back. ?She sees Dr. Corliss Skainseveshwar for OA and pseudogout >> on Plaquenil and Colchicine >> off. ?She has a history of kidney stones. ? She had R TKR in 11/2019 >> slow recovery >> then hurt her back >> on pain medicines, steroids.  ? ?ROS: ?+ see HPI ? ?I reviewed pt's medications, allergies, PMH, social hx, family hx, and changes were documented in the history of present illness. Otherwise, unchanged from my initial visit note. ? ?Past Medical History:  ?Diagnosis Date  ? Arthritis   ? Chest pain 06/2013  ? Left sided  ? Chronic leg pain 06/2013  ? Complication of anesthesia   ? Dysrhythmia   ? Fatigue   ? GERD (gastroesophageal reflux disease)   ? History of kidney stones   ? Hypertension   ? Hypothyroidism   ? Nonproductive cough 06/2013  ? Overweight 01/18/2014  ? Peripheral neuropathy   ? PONV (postoperative nausea and vomiting)   ? Sleep difficulties   ? Change in sleep patterns  ? SOB (shortness of breath) 06/2013  ? Tachycardia 06/2013  ? Thyroid disease   ? hypo  ? UTI (lower urinary tract infection)   ? Vesico-ureteric reflux   ? ?Past Surgical History:  ?Procedure Laterality Date  ? ABDOMINAL SURGERY  1994  ? CYSTOSCOPY/URETEROSCOPY/HOLMIUM LASER/STENT PLACEMENT Left 09/22/2017  ? Procedure: CYSTOSCOPY/RETROGRADE/URETEROSCOPY/ BASKET  STONE EXTRACTION/STENT PLACEMENT;  Surgeon: Rene PaciWinter, Christopher Aaron, MD;  Location: [facility];  Service: Urology;  Laterality: Left;  ONLY NEEDS 60 MIN  ? FOOT SURGERY Right 2009  ? KIDNEY SURGERY Left 1993  ? KNEE ARTHROPLASTY Right 11/02/2019  ? KNEE SURGERY    ? Multiple knee surgeries  ? OOPHORECTOMY Left 1994  ? REPLACEMENT TOTAL KNEE Left 10/13/2018  ? Dr. Thurston HoleWainer  ? small fiber neuropathy biopsy  12/2017  ? TONSILLECTOMY    ? URETEROSCOPY VIA URETEROSTOMY Left 1994  ? ?Social History  ? ?Socioeconomic History  ? Marital status: Married  ?  Spouse name: Not on file  ? Number of children: 0  ? Years of education: Not on file  ? Highest education level: Not on file  ?Occupational History  ? Occupation: OrthoptistBusiness and financial analyst  ?Tobacco Use  ?  Smoking status: Never  ?  Passive exposure: Past  ? Smokeless tobacco: Never  ?Vaping Use  ? Vaping Use: Never used  ?Substance and Sexual Activity  ? Alcohol use: Yes  ?  Alcohol/week: 1.0 - 2.0 standard drink  ?  Types: 1 - 2 Glasses of wine per week  ?  Comment: Twice a week  ? Drug use: No  ? Sexual activity: Yes  ?  Birth control/protection: None  ?Other Topics Concern  ? Not on file  ?Social History Narrative  ? Not on file  ? ?Social Determinants of Health  ? ?Financial Resource Strain: Not on file  ?Food Insecurity: Not on file  ?Transportation Needs: Not on file  ?Physical Activity: Not on file  ?Stress: Not on file  ?Social Connections: Not on file  ?Intimate Partner Violence: Not on file  ? ?Current Outpatient Medications on File Prior to Visit  ?Medication Sig Dispense Refill  ? chlorpheniramine-HYDROcodone (TUSSIONEX PENNKINETIC ER) 10-8 MG/5ML Take 5 mLs by mouth at bedtime as needed for cough. 70 mL 0  ? CREAM BASE EX Apply 1 application topically daily. BIEST Transdermal Cream 0.5 mg: Compounded bio-identical Estriol (E3) combined by [facility]    ? escitalopram (LEXAPRO) 20 MG tablet TAKE 1 TABLET BY MOUTH DAILY. 90 tablet 0  ? eszopiclone  (LUNESTA) 1 MG TABS tablet Take 1 mg by mouth at bedtime as needed.    ? famotidine (PEPCID) 40 MG tablet Take 40 mg by mouth daily as needed (for heartburn/indigestion.).  2  ? Flaxseed, Linseed, (FLAX SEED OIL) 100

## 2021-04-09 MED ORDER — NITROFURANTOIN MONOHYD MACRO 100 MG PO CAPS: 100.0000 mg | ORAL_CAPSULE | Freq: Two times a day (BID) | ORAL | 0 refills | Status: DC

## 2021-04-09 NOTE — Progress Notes (Signed)
Patient c/o cloudy urine, dysuria, and flank pain. UA collected and sent for urine culture to r/o UTI. Given symptoms and small amount of leukocyte will start empiric antibiotic therapy. MA. PA-C ?

## 2021-04-11 ENCOUNTER — Encounter: Payer: BC Managed Care – PPO | Admitting: Physician Assistant

## 2021-04-11 LAB — URINE CULTURE

## 2021-04-23 ENCOUNTER — Encounter: Payer: BC Managed Care – PPO | Admitting: Physician Assistant

## 2021-04-25 ENCOUNTER — Encounter: Payer: Self-pay | Admitting: Internal Medicine

## 2021-04-26 DIAGNOSIS — I10 Essential (primary) hypertension: Secondary | ICD-10-CM | POA: Diagnosis not present

## 2021-04-26 DIAGNOSIS — E538 Deficiency of other specified B group vitamins: Secondary | ICD-10-CM | POA: Diagnosis not present

## 2021-04-26 DIAGNOSIS — R790 Abnormal level of blood mineral: Secondary | ICD-10-CM | POA: Diagnosis not present

## 2021-04-26 DIAGNOSIS — R7989 Other specified abnormal findings of blood chemistry: Secondary | ICD-10-CM | POA: Diagnosis not present

## 2021-05-02 NOTE — Patient Instructions (Signed)

## 2021-05-08 ENCOUNTER — Encounter: Payer: Self-pay | Admitting: Physician Assistant

## 2021-05-09 ENCOUNTER — Ambulatory Visit (INDEPENDENT_AMBULATORY_CARE_PROVIDER_SITE_OTHER): Payer: BC Managed Care – PPO | Admitting: Physician Assistant

## 2021-05-09 ENCOUNTER — Encounter: Payer: Self-pay | Admitting: Physician Assistant

## 2021-05-09 ENCOUNTER — Other Ambulatory Visit: Payer: Self-pay | Admitting: Physician Assistant

## 2021-05-09 VITALS — BP 146/82 | HR 79 | Temp 97.7°F | Ht 66.0 in | Wt 194.0 lb

## 2021-05-09 DIAGNOSIS — I10 Essential (primary) hypertension: Secondary | ICD-10-CM

## 2021-05-09 DIAGNOSIS — Z0000 Encounter for general adult medical examination without abnormal findings: Secondary | ICD-10-CM | POA: Diagnosis not present

## 2021-05-09 DIAGNOSIS — Z23 Encounter for immunization: Secondary | ICD-10-CM

## 2021-05-09 DIAGNOSIS — E559 Vitamin D deficiency, unspecified: Secondary | ICD-10-CM

## 2021-05-09 NOTE — Progress Notes (Signed)
? ?Complete physical exam ? ? ?Patient: Marissa Larson   DOB: November 03, 1955   66 y.o. Female  MRN: [id number] ?Visit Date: 05/09/2021 ? ? ?Chief Complaint  ?Patient presents with  ? Annual Exam  ? ?Subjective  ?  ?Marissa Larson is a 66 y.o. female who presents today for a complete physical exam.  ?She reports consuming a general diet. Home exercise routine includes Walking as tolerated. She generally feels fairly well. She does not have additional problems to discuss today.  ? ? ?Past Medical History:  ?Diagnosis Date  ? Arthritis   ? Chest pain 06/2013  ? Left sided  ? Chronic leg pain 06/2013  ? Complication of anesthesia   ? Dysrhythmia   ? Fatigue   ? GERD (gastroesophageal reflux disease)   ? History of kidney stones   ? Hypertension   ? Hypothyroidism   ? Nonproductive cough 06/2013  ? Overweight 01/18/2014  ? Peripheral neuropathy   ? PONV (postoperative nausea and vomiting)   ? Sleep difficulties   ? Change in sleep patterns  ? SOB (shortness of breath) 06/2013  ? Tachycardia 06/2013  ? Thyroid disease   ? hypo  ? UTI (lower urinary tract infection)   ? Vesico-ureteric reflux   ? ?Past Surgical History:  ?Procedure Laterality Date  ? ABDOMINAL SURGERY  1994  ? CYSTOSCOPY/URETEROSCOPY/HOLMIUM LASER/STENT PLACEMENT Left 09/22/2017  ? Procedure: CYSTOSCOPY/RETROGRADE/URETEROSCOPY/ BASKET STONE EXTRACTION/STENT PLACEMENT;  Surgeon: Ceasar Mons, MD;  Location: [facility];  Service: Urology;  Laterality: Left;  ONLY NEEDS 60 MIN  ? FOOT SURGERY Right 2009  ? KIDNEY SURGERY Left 1993  ? KNEE ARTHROPLASTY Right 11/02/2019  ? KNEE SURGERY    ? Multiple knee surgeries  ? OOPHORECTOMY Left 1994  ? REPLACEMENT TOTAL KNEE Left 10/13/2018  ? Dr. Noemi Chapel  ? small fiber neuropathy biopsy  12/2017  ? TONSILLECTOMY    ? URETEROSCOPY VIA URETEROSTOMY Left 1994  ? ?Social History  ? ?Socioeconomic History  ? Marital status: Married  ?  Spouse name: Not on file  ? Number of children: 0  ? Years of education: Not on file  ?  Highest education level: Not on file  ?Occupational History  ? Occupation: Scientist, research (medical)  ?Tobacco Use  ? Smoking status: Never  ?  Passive exposure: Past  ? Smokeless tobacco: Never  ?Vaping Use  ? Vaping Use: Never used  ?Substance and Sexual Activity  ? Alcohol use: Yes  ?  Alcohol/week: 1.0 - 2.0 standard drink  ?  Types: 1 - 2 Glasses of wine per week  ?  Comment: Twice a week  ? Drug use: No  ? Sexual activity: Yes  ?  Birth control/protection: None  ?Other Topics Concern  ? Not on file  ?Social History Narrative  ? Not on file  ? ?Social Determinants of Health  ? ?Financial Resource Strain: Not on file  ?Food Insecurity: Not on file  ?Transportation Needs: Not on file  ?Physical Activity: Not on file  ?Stress: Not on file  ?Social Connections: Not on file  ?Intimate Partner Violence: Not on file  ? ? ? ?Medications: ?Outpatient Medications Prior to Visit  ?Medication Sig  ? chlorpheniramine-HYDROcodone (TUSSIONEX PENNKINETIC ER) 10-8 MG/5ML Take 5 mLs by mouth at bedtime as needed for cough.  ? CREAM BASE EX Apply 1 application topically daily. BIEST Transdermal Cream 0.5 mg: Compounded bio-identical Estriol (E3) combined by [facility]  ? escitalopram (LEXAPRO) 20 MG tablet TAKE 1 TABLET  BY MOUTH DAILY. (Patient taking differently: 10 mg.)  ? eszopiclone (LUNESTA) 1 MG TABS tablet Take 1 mg by mouth at bedtime as needed.  ? famotidine (PEPCID) 40 MG tablet Take 40 mg by mouth daily as needed (for heartburn/indigestion.).  ? Flaxseed, Linseed, (FLAX SEED OIL) 1000 MG CAPS Take 2,000 mg by mouth every evening.  ? hydrOXYzine (ATARAX) 25 MG tablet TAKE 1 TABLET (25 MG TOTAL) BY MOUTH AT BEDTIME AS NEEDED. (Patient taking differently: Take 12.5 mg by mouth at bedtime as needed.)  ? Magnesium 500 MG CAPS Take 1,000 mg by mouth every evening.  ? meloxicam (MOBIC) 7.5 MG tablet Take 1 tablet (7.5 mg total) by mouth daily.  ? NONFORMULARY OR COMPOUNDED ITEM Take 150 mg by mouth at bedtime.  Bio identical Progesterone 150 ([facility])  ? Omega-3 Fatty Acids (FISH OIL) 1200 MG CAPS Take 2,400 mg by mouth every evening.  ? SYNTHROID 100 MCG tablet Take 1 tablet (100 mcg total) by mouth daily before breakfast. (Patient taking differently: Take 112 mcg by mouth daily before breakfast.)  ? Vitamin D, Ergocalciferol, (DRISDOL) 1.25 MG (50000 UNIT) CAPS capsule TAKE 1 CAPSULE BY MOUTH EVERY 7 DAYS.  ? omeprazole (PRILOSEC) 40 MG capsule Take by mouth as needed.  ? [DISCONTINUED] fluticasone (FLONASE) 50 MCG/ACT nasal spray Place 2 sprays into both nostrils daily. (Patient not taking: Reported on 03/07/2021)  ? [DISCONTINUED] gabapentin (NEURONTIN) 300 MG capsule  (Patient not taking: Reported on 03/07/2021)  ? [DISCONTINUED] nitrofurantoin, macrocrystal-monohydrate, (MACROBID) 100 MG capsule Take 1 capsule (100 mg total) by mouth 2 (two) times daily. (Patient not taking: Reported on 05/09/2021)  ? [DISCONTINUED] predniSONE (DELTASONE) 20 MG tablet Take 2 tablets by mouth x 2 days, 1 tablets x 2 days, 0.5 tablet x 2 days (Patient not taking: Reported on 05/09/2021)  ? ?No facility-administered medications prior to visit.  ? ? ?Review of Systems ?Review of Systems:  ?A fourteen system review of systems was performed and found to be positive as per HPI. ? ?Last CBC ?Lab Results  ?Component Value Date  ? WBC 8.4 12/11/2020  ? HGB 15.1 12/11/2020  ? HCT 43.9 12/11/2020  ? MCV 92 12/11/2020  ? MCH 31.5 12/11/2020  ? RDW 12.6 12/11/2020  ? PLT 280 12/11/2020  ? ?Last metabolic panel ?Lab Results  ?Component Value Date  ? GLUCOSE 102 (H) 12/11/2020  ? NA 142 12/11/2020  ? K 4.3 12/11/2020  ? CL 102 12/11/2020  ? CO2 28 12/11/2020  ? BUN 15 12/11/2020  ? CREATININE 0.64 12/11/2020  ? EGFR 99 12/11/2020  ? CALCIUM 10.1 12/11/2020  ? PROT 7.0 12/11/2020  ? ALBUMIN 4.8 12/11/2020  ? LABGLOB 2.2 12/11/2020  ? AGRATIO 2.2 12/11/2020  ? BILITOT 0.4 12/11/2020  ? ALKPHOS 105 12/11/2020  ? AST 34 12/11/2020  ? ALT 28  12/11/2020  ? ANIONGAP 10 10/13/2019  ? ?Last lipids ?Lab Results  ?Component Value Date  ? CHOL 220 (H) 12/11/2020  ? HDL 86 12/11/2020  ? LDLCALC 113 (H) 12/11/2020  ? TRIG 120 12/11/2020  ? CHOLHDL 2.6 12/11/2020  ? ?Last hemoglobin A1c ?Lab Results  ?Component Value Date  ? HGBA1C 5.9 04/08/2021  ? ?Last thyroid functions ?Lab Results  ?Component Value Date  ? TSH 0.28 (L) 04/08/2021  ? T3TOTAL 136 12/11/2020  ? ?Last vitamin D ?Lab Results  ?Component Value Date  ? VD25OH 46.2 12/11/2020  ? ? Objective  ? ?  ?BP (!) 146/82   Pulse 79  Temp 97.7 ?F (36.5 ?C)   Ht '5\' 6"'  (1.676 m)   Wt 194 lb (88 kg)   SpO2 95%   BMI 31.31 kg/m?  ?BP Readings from Last 3 Encounters:  ?05/09/21 (!) 146/82  ?04/08/21 (!) 142/100  ?03/27/21 122/78  ? ?Wt Readings from Last 3 Encounters:  ?05/09/21 194 lb (88 kg)  ?04/08/21 191 lb (86.6 kg)  ?03/27/21 190 lb (86.2 kg)  ? ? ?Physical Exam  ? ?General Appearance:     Alert, cooperative, in no acute distress, appears stated age   ?Head:    Normocephalic, without obvious abnormality, atraumatic  ?Eyes:    PERRL, conjunctiva/corneas clear, EOM's intact, fundi  ?  benign, both eyes  ?Ears:    Normal TM's and external ear canals, both ears  ?Nose:   Nares normal, septum midline, mucosa normal, no drainage  ?  or sinus tenderness  ?Throat:   Lips, mucosa, and tongue normal; teeth and gums normal  ?Neck:   Supple, symmetrical, trachea midline, no adenopathy;  ?  thyroid:  no tenderness/enlargement; no JVD  ?Back:     Symmetric, no curvature, ROM normal, no CVA tenderness  ?Lungs:     Clear to auscultation bilaterally, respirations unlabored  ?Chest Wall:    No tenderness or deformity  ? Heart:    Normal heart rate. Normal rhythm. No murmurs, rubs, or gallops.  ?  ?Breast Exam:    deferred  ?Abdomen:     Soft, non-tender, bowel sounds active all four quadrants,  ?  no masses, no organomegaly  ?Pelvic:    deferred  ?Extremities:   All extremities are intact. No cyanosis or edema  ?Pulses:    2+ and symmetric all extremities  ?Skin:   Skin color, texture, turgor normal, no rashes or lesions  ?Lymph nodes:   Cervical and supraclavicular nodes normal  ?Neurologic:   CNII-XII grossly intact.  ? ? ? ?Last dep

## 2021-05-20 ENCOUNTER — Other Ambulatory Visit (INDEPENDENT_AMBULATORY_CARE_PROVIDER_SITE_OTHER): Payer: BC Managed Care – PPO

## 2021-05-20 DIAGNOSIS — R1084 Generalized abdominal pain: Secondary | ICD-10-CM | POA: Diagnosis not present

## 2021-05-20 DIAGNOSIS — E039 Hypothyroidism, unspecified: Secondary | ICD-10-CM

## 2021-05-20 DIAGNOSIS — N390 Urinary tract infection, site not specified: Secondary | ICD-10-CM | POA: Diagnosis not present

## 2021-05-20 DIAGNOSIS — N3020 Other chronic cystitis without hematuria: Secondary | ICD-10-CM | POA: Diagnosis not present

## 2021-05-20 DIAGNOSIS — B9620 Unspecified Escherichia coli [E. coli] as the cause of diseases classified elsewhere: Secondary | ICD-10-CM | POA: Diagnosis not present

## 2021-05-20 LAB — TSH: TSH: 1.83 u[IU]/mL (ref 0.35–5.50)

## 2021-05-20 LAB — T4, FREE: Free T4: 0.93 ng/dL (ref 0.60–1.60)

## 2021-05-22 ENCOUNTER — Encounter: Payer: Self-pay | Admitting: Internal Medicine

## 2021-05-27 DIAGNOSIS — N200 Calculus of kidney: Secondary | ICD-10-CM | POA: Diagnosis not present

## 2021-05-27 DIAGNOSIS — I700 Atherosclerosis of aorta: Secondary | ICD-10-CM | POA: Diagnosis not present

## 2021-05-27 DIAGNOSIS — N3020 Other chronic cystitis without hematuria: Secondary | ICD-10-CM | POA: Diagnosis not present

## 2021-05-27 DIAGNOSIS — K760 Fatty (change of) liver, not elsewhere classified: Secondary | ICD-10-CM | POA: Diagnosis not present

## 2021-05-29 ENCOUNTER — Other Ambulatory Visit: Payer: Self-pay | Admitting: Physician Assistant

## 2021-05-29 DIAGNOSIS — F4323 Adjustment disorder with mixed anxiety and depressed mood: Secondary | ICD-10-CM

## 2021-05-31 ENCOUNTER — Other Ambulatory Visit: Payer: Self-pay | Admitting: Physician Assistant

## 2021-05-31 NOTE — Telephone Encounter (Signed)
Next Visit: Due September 2023. Message sent to the front to schedule patient.   Last Visit: 03/07/2021  Last Fill: 03/07/2021  DX: Inflammatory arthritis   Current Dose per office note 03/07/2021: meloxicam 7.5 mg 1 tablet at bedtime daily.  Labs: 12/11/2020 Glucose 102,   Okay to refill Meloxicam?

## 2021-05-31 NOTE — Telephone Encounter (Signed)
LMOM for patient to call and schedule follow-up appointment.   °

## 2021-05-31 NOTE — Telephone Encounter (Signed)
Please schedule patient for a follow up visit. Patient due September 2023. Thanks!

## 2021-06-01 NOTE — Telephone Encounter (Signed)
Please notify patient that there is increased risk of GI bleeding with meloxicam use with Lexapro.  She should watch for symptoms of GI bleeding which should include abdominal discomfort, fresh blood in the stool or dark stools.

## 2021-06-04 NOTE — Telephone Encounter (Signed)
Attempted to contact the patient and left message for patient to call the office.  

## 2021-06-07 ENCOUNTER — Other Ambulatory Visit: Payer: Self-pay | Admitting: Physician Assistant

## 2021-06-07 DIAGNOSIS — F5104 Psychophysiologic insomnia: Secondary | ICD-10-CM

## 2021-06-07 NOTE — Telephone Encounter (Signed)
Patient that there is increased risk of GI bleeding with meloxicam use with Lexapro.  She should watch for symptoms of GI bleeding which should include abdominal discomfort, fresh blood in the stool or dark stools. Patient expressed understanding.

## 2021-06-12 DIAGNOSIS — M79672 Pain in left foot: Secondary | ICD-10-CM | POA: Diagnosis not present

## 2021-07-05 ENCOUNTER — Other Ambulatory Visit: Payer: Self-pay | Admitting: Physician Assistant

## 2021-07-05 DIAGNOSIS — K521 Toxic gastroenteritis and colitis: Secondary | ICD-10-CM | POA: Diagnosis not present

## 2021-07-05 DIAGNOSIS — T3695XA Adverse effect of unspecified systemic antibiotic, initial encounter: Secondary | ICD-10-CM | POA: Diagnosis not present

## 2021-07-08 ENCOUNTER — Other Ambulatory Visit: Payer: Self-pay | Admitting: Nurse Practitioner

## 2021-07-08 DIAGNOSIS — F5104 Psychophysiologic insomnia: Secondary | ICD-10-CM

## 2021-07-08 MED ORDER — ESZOPICLONE 1 MG PO TABS: 1.0000 mg | ORAL_TABLET | Freq: Every evening | ORAL | 0 refills | Status: DC | PRN

## 2021-07-12 DIAGNOSIS — M79672 Pain in left foot: Secondary | ICD-10-CM | POA: Diagnosis not present

## 2021-07-20 ENCOUNTER — Other Ambulatory Visit: Payer: Self-pay | Admitting: Internal Medicine

## 2021-08-01 ENCOUNTER — Other Ambulatory Visit: Payer: Self-pay | Admitting: Physician Assistant

## 2021-08-01 DIAGNOSIS — E559 Vitamin D deficiency, unspecified: Secondary | ICD-10-CM

## 2021-08-07 DIAGNOSIS — K760 Fatty (change of) liver, not elsewhere classified: Secondary | ICD-10-CM | POA: Diagnosis not present

## 2021-08-07 DIAGNOSIS — R197 Diarrhea, unspecified: Secondary | ICD-10-CM | POA: Diagnosis not present

## 2021-08-08 DIAGNOSIS — N200 Calculus of kidney: Secondary | ICD-10-CM | POA: Diagnosis not present

## 2021-08-08 DIAGNOSIS — N3020 Other chronic cystitis without hematuria: Secondary | ICD-10-CM | POA: Diagnosis not present

## 2021-08-08 DIAGNOSIS — K760 Fatty (change of) liver, not elsewhere classified: Secondary | ICD-10-CM | POA: Diagnosis not present

## 2021-08-28 DIAGNOSIS — N1370 Vesicoureteral-reflux, unspecified: Secondary | ICD-10-CM | POA: Diagnosis not present

## 2021-08-28 DIAGNOSIS — N3090 Cystitis, unspecified without hematuria: Secondary | ICD-10-CM | POA: Diagnosis not present

## 2021-08-28 DIAGNOSIS — N23 Unspecified renal colic: Secondary | ICD-10-CM | POA: Diagnosis not present

## 2021-08-30 ENCOUNTER — Other Ambulatory Visit: Payer: Self-pay | Admitting: Rheumatology

## 2021-08-30 NOTE — Telephone Encounter (Signed)
Next Visit: 10/08/2021   Last Visit: 03/07/2021   Last Fill: 06/01/2021   DX: Inflammatory arthritis    Current Dose per office note 03/07/2021: meloxicam 7.5 mg 1 tablet at bedtime daily.   Labs: 08/07/2021 Glucose 131, Creat. 0.53    Okay to refill Meloxicam?

## 2021-09-06 ENCOUNTER — Other Ambulatory Visit: Payer: Self-pay | Admitting: Physician Assistant

## 2021-09-06 DIAGNOSIS — F5104 Psychophysiologic insomnia: Secondary | ICD-10-CM

## 2021-09-11 ENCOUNTER — Other Ambulatory Visit: Payer: Self-pay | Admitting: Physician Assistant

## 2021-09-11 DIAGNOSIS — F4323 Adjustment disorder with mixed anxiety and depressed mood: Secondary | ICD-10-CM

## 2021-09-23 DIAGNOSIS — N23 Unspecified renal colic: Secondary | ICD-10-CM | POA: Diagnosis not present

## 2021-09-24 ENCOUNTER — Ambulatory Visit: Payer: BC Managed Care – PPO | Admitting: Rheumatology

## 2021-09-24 ENCOUNTER — Ambulatory Visit: Payer: BC Managed Care – PPO | Admitting: Physician Assistant

## 2021-09-24 NOTE — Progress Notes (Signed)
Office Visit Note  Patient: Marissa Larson             Date of Birth: 12-22-1955           MRN: [id number]             PCP: Mayer Masker, PA-C Referring: Mayer Masker, PA-C Visit Date: 10/08/2021 Occupation: @GUAROCC @  Subjective:  Other (Patient would like to discuss alternatives to meloxicam. )   History of Present Illness: Marissa Larson is a 66 y.o. female history of chondrocalcinosis and osteoarthritis.  She has been on meloxicam 7.5 mg p.o. daily for joint pain and inflammation.  She states the dose of meloxicam 7.5 mg p.o. daily has been working well for her.  She recently was seen by her gastroenterologist due to ongoing diarrhea.  She had an elevated calprotectin level suggesting inflammation.  She was advised to avoid all NSAIDs and Lexapro.  She states she has been having pain and discomfort in her bilateral wrist joints for the last 2 weeks and her left knee joint continues to hurt.    Activities of Daily Living:  Patient reports morning stiffness for 20 minutes.   Patient Denies nocturnal pain.  Difficulty dressing/grooming: Denies Difficulty climbing stairs: Reports Difficulty getting out of chair: Reports Difficulty using hands for taps, buttons, cutlery, and/or writing: Reports  Review of Systems  Constitutional:  Positive for fatigue.  HENT:  Negative for mouth sores and mouth dryness.   Eyes:  Negative for dryness.  Respiratory:  Negative for shortness of breath.   Cardiovascular:  Negative for chest pain and palpitations.  Gastrointestinal:  Positive for diarrhea. Negative for blood in stool and constipation.  Endocrine: Negative for increased urination.  Genitourinary:  Negative for involuntary urination.  Musculoskeletal:  Positive for joint pain, joint pain, joint swelling, myalgias, muscle weakness, morning stiffness, muscle tenderness and myalgias. Negative for gait problem.  Skin:  Negative for color change, rash, hair loss and sensitivity to  sunlight.  Allergic/Immunologic: Negative for susceptible to infections.  Neurological:  Positive for dizziness. Negative for headaches.  Hematological:  Negative for swollen glands.  Psychiatric/Behavioral:  Positive for sleep disturbance. Negative for depressed mood. The patient is not nervous/anxious.     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   Obesity, Class I, BMI 30-34.9 12/03/2016   Kidney stones 09/12/2016   Nausea 09/12/2016   High risk medication use 07/30/2016   NAFLD (nonalcoholic fatty liver disease) 08/01/2016   Primary osteoarthritis of both feet 04/12/2016   History of kidney stones 04/12/2016   Primary osteoarthritis of both hands 04/11/2016   Bilateral primary osteoarthritis of hip 04/11/2016   Primary osteoarthritis of both knees 04/11/2016   Spondylosis of lumbar region without myelopathy or radiculopathy 04/11/2016   Hyperuricemia 04/11/2016   Primary insomnia 04/11/2016   Flank pain 04/02/2016   Sinusitis 04/02/2016   Elevated HDL 11/11/2015   Elevated LFTs 11/11/2015   Adjustment disorder with mixed anxiety and depressed mood 11/11/2015  Dysuria 11/11/2015   Hypothyroidism 10/14/2015   Pseudogout involving multiple joints 10/14/2015   Vitamin D deficiency 10/14/2015   Hormone replacement therapy- per GYN 10/14/2015   HTN (hypertension) 10/14/2015   Fatigue 10/14/2015   h/o Hiatal hernia 10/14/2015    Sleep difficulties 10/14/2015   Generalized OA 10/14/2015   Counseling on health promotion and disease prevention 10/14/2015   Gastric ulcer- due to Mobic 10/09/2015   Age-related nuclear cataract of both eyes 01/25/2015   Posterior vitreous detachment of left eye 01/25/2015   chronic Palpitations 07/19/2013    Past Medical History:  Diagnosis Date   Arthritis    Chest pain 06/2013   Left sided   Chronic leg pain 06/2013   Complication of anesthesia    Dysrhythmia    Fatigue    GERD (gastroesophageal reflux disease)    History of kidney stones    Hypertension    Hypothyroidism    Nonproductive cough 06/2013   Overweight 01/18/2014   Peripheral neuropathy    PONV (postoperative nausea and vomiting)    Sleep difficulties    Change in sleep patterns   SOB (shortness of breath) 06/2013   Tachycardia 06/2013   Thyroid disease    hypo   UTI (lower urinary tract infection)    Vesico-ureteric reflux     Family History  Problem Relation Age of Onset   Migraines Other        Fam Hx    Heart disease Other        Fam Hx   Thyroid disease Other        Fam Hx - She also has thyroid disease   Diabetes Other        Fam Hx   Arthritis Other        Fam Hx   Gallbladder disease Other        Fam Hx   Cancer Other        Fam Hx - Pancreatic   Hypertension Other        Fam Hx   Heart attack Cousin        Multiple cousins with MI's before 62 years of age   Heart disease Mother 66   Hyperlipidemia Mother 13   Hypertension Mother 73   Stroke Mother 45   Heart disease Maternal Grandmother    Cancer Father 55       pancreatic   Diabetes Brother 50   Heart attack Maternal Uncle    Heart attack Paternal Aunt    Past Surgical History:  Procedure Laterality Date   ABDOMINAL SURGERY  1994   CYSTOSCOPY/URETEROSCOPY/HOLMIUM LASER/STENT PLACEMENT Left 09/22/2017   Procedure: CYSTOSCOPY/RETROGRADE/URETEROSCOPY/ BASKET STONE EXTRACTION/STENT PLACEMENT;  Surgeon: Rene Paci,  MD;  Location: [facility];  Service: Urology;  Laterality: Left;  ONLY NEEDS 60 MIN   FOOT SURGERY Right 2009   KIDNEY SURGERY Left 1993   KNEE ARTHROPLASTY Right 11/02/2019   KNEE SURGERY     Multiple knee surgeries   OOPHORECTOMY Left 1994   REPLACEMENT TOTAL KNEE Left 10/13/2018   Dr. Thurston Hole   small fiber neuropathy biopsy  12/2017   TONSILLECTOMY     URETEROSCOPY VIA URETEROSTOMY Left 1994   Social History   Social History Narrative   Not on file   Immunization History  Administered Date(s) Administered   Tdap 01/07/2011     Objective: Vital Signs: BP (!) 148/78 (BP Location: Left Arm, Patient Position: Sitting, Cuff Size: Normal)   Pulse 80   Resp  15   Ht 5\' 6"  (1.676 m)   Wt 192 lb 12.8 oz (87.5 kg)   BMI 31.12 kg/m    Physical Exam Vitals and nursing note reviewed.  Constitutional:      Appearance: She is well-developed.  HENT:     Head: Normocephalic and atraumatic.  Eyes:     Conjunctiva/sclera: Conjunctivae normal.  Cardiovascular:     Rate and Rhythm: Normal rate and regular rhythm.     Heart sounds: Normal heart sounds.  Pulmonary:     Effort: Pulmonary effort is normal.     Breath sounds: Normal breath sounds.  Abdominal:     General: Bowel sounds are normal.     Palpations: Abdomen is soft.  Musculoskeletal:     Cervical back: Normal range of motion.  Lymphadenopathy:     Cervical: No cervical adenopathy.  Skin:    General: Skin is warm and dry.     Capillary Refill: Capillary refill takes less than 2 seconds.  Neurological:     Mental Status: She is alert and oriented to person, place, and time.  Psychiatric:        Behavior: Behavior normal.      Musculoskeletal Exam: Cervical spine was in good range of motion.  She had discomfort range of motion of her lumbar spine.  Shoulder joints, elbow joints, wrist joints, MCPs PIPs and DIPs were in good range of motion with no synovitis.  She had tenderness on palpation over bilateral wrist joints.  No  synovitis was noted.  Both knee joints were replaced and had no warmth swelling or effusion.  She had limited range of motion of bilateral hip joints due to lower back pain.  There was no tenderness over ankles or MTPs.  CDAI Exam: CDAI Score: -- Patient Global: --; Provider Global: -- Swollen: --; Tender: -- Joint Exam 10/08/2021   No joint exam has been documented for this visit   There is currently no information documented on the homunculus. Go to the Rheumatology activity and complete the homunculus joint exam.  Investigation: No additional findings.  Imaging: No results found.  Recent Labs: Lab Results  Component Value Date   WBC 8.4 12/11/2020   HGB 15.1 12/11/2020   PLT 280 12/11/2020   NA 142 12/11/2020   K 4.3 12/11/2020   CL 102 12/11/2020   CO2 28 12/11/2020   GLUCOSE 102 (H) 12/11/2020   BUN 15 12/11/2020   CREATININE 0.64 12/11/2020   BILITOT 0.4 12/11/2020   ALKPHOS 105 12/11/2020   AST 34 12/11/2020   ALT 28 12/11/2020   PROT 7.0 12/11/2020   ALBUMIN 4.8 12/11/2020   CALCIUM 10.1 12/11/2020   GFRAA 108 08/17/2019   QFTBGOLDPLUS NEGATIVE 02/16/2017    Speciality Comments: PLQ eye exam: 04/20/2019 normal. Dr. Satira Sark. Follow up in 1 year.  Procedures:  No procedures performed Allergies: Penicillins and Sulfa antibiotics   Assessment / Plan:     Visit Diagnoses: Inflammatory arthritis-patient had history of chronic inflammatory arthritis.  She was given a trial of Plaquenil which was not effective.  Although when she tried meloxicam she had a good response to it.  She had been on meloxicam for a while which has been working well for her.  She had to reduce the dose of meloxicam to 7.5 mg p.o. daily.  She has been having increased pain and stiffness since she has reduced the dose of meloxicam.  She complains of discomfort in the bilateral wrist joints and left knee joint  which has been replaced.  Medication management - meloxicam 7.5 mg 1 tablet at  bedtime.  August 30, 2021 CMP was normal.  I will obtain CBC with differential today.  Pseudogout involving multiple joints - Discontinued colchicine in October 2021.  She has been taking meloxicam 7.5 mg 1 tablet daily for symptomatic relief.   Primary osteoarthritis of both hands-she has underlying osteoarthritis in her hands.  Joint protection muscle strengthening was discussed.  Bilateral primary osteoarthritis of hip-she had very limited range of motion of her bilateral hip joints.  She has been followed by orthopedics.  S/P total knee arthroplasty, right - Performed by Dr. Thurston HoleWainer.  She had good range of motion without discomfort.  S/P total knee replacement, left - She had surgery by Dr. Thurston HoleWainer in 2020.  She had good range of motion with discomfort without any warmth swelling or effusion.  Primary osteoarthritis of both feet-proper fitting shoes were advised.  Spondylosis of lumbar region without myelopathy or radiculopathy -she has chronic lower back pain.  [facility]    Chronic diarrhea-patient [facility]  She is found to have elevated calprotectin level.  She was advised to modify her diet and possibly come off NSAIDs.  She is trying dietary modifications.  I discussed with her if she has to come off NSAIDs she can try natural anti-inflammatories.  If she develops any increased joint pain or joint swelling she should notify us.  Hyperuricemia  Osteopenia of multiple sites-April 2022 T score was -2.3 in the lumbar spine.  Calcium rich diet and exercise was emphasized.  Small fiber neuropathy  NAFLD (nonalcoholic fatty liver disease)  History of kidney stones  Vitamin D deficiency  Essential hypertension  Primary insomnia  h/o Hiatal hernia  Orders: Orders Placed This Encounter  Procedures   CBC with Differential/Platelet   No orders of the defined types were placed in this  encounter.    Follow-Up Instructions: Return in about 6 months (around 04/09/2022) for Osteoarthritis.   Pollyann SavoyShaili Carlin Mamone, MD  Note - This record has been created using [employer] software.  Chart creation errors have been sought, but may not always  have been located. Such creation errors do not reflect on  the standard of medical care.

## 2021-10-08 ENCOUNTER — Encounter: Payer: Self-pay | Admitting: Rheumatology

## 2021-10-08 ENCOUNTER — Encounter: Payer: Self-pay | Admitting: Internal Medicine

## 2021-10-08 ENCOUNTER — Ambulatory Visit: Payer: BC Managed Care – PPO | Attending: Rheumatology | Admitting: Rheumatology

## 2021-10-08 VITALS — BP 148/78 | HR 80 | Resp 15 | Ht 66.0 in | Wt 192.8 lb

## 2021-10-08 DIAGNOSIS — M19041 Primary osteoarthritis, right hand: Secondary | ICD-10-CM | POA: Diagnosis not present

## 2021-10-08 DIAGNOSIS — Z79899 Other long term (current) drug therapy: Secondary | ICD-10-CM | POA: Diagnosis not present

## 2021-10-08 DIAGNOSIS — Z87442 Personal history of urinary calculi: Secondary | ICD-10-CM

## 2021-10-08 DIAGNOSIS — Z96652 Presence of left artificial knee joint: Secondary | ICD-10-CM

## 2021-10-08 DIAGNOSIS — M19072 Primary osteoarthritis, left ankle and foot: Secondary | ICD-10-CM

## 2021-10-08 DIAGNOSIS — M160 Bilateral primary osteoarthritis of hip: Secondary | ICD-10-CM

## 2021-10-08 DIAGNOSIS — I10 Essential (primary) hypertension: Secondary | ICD-10-CM

## 2021-10-08 DIAGNOSIS — R197 Diarrhea, unspecified: Secondary | ICD-10-CM

## 2021-10-08 DIAGNOSIS — E790 Hyperuricemia without signs of inflammatory arthritis and tophaceous disease: Secondary | ICD-10-CM

## 2021-10-08 DIAGNOSIS — M19071 Primary osteoarthritis, right ankle and foot: Secondary | ICD-10-CM

## 2021-10-08 DIAGNOSIS — M1189 Other specified crystal arthropathies, multiple sites: Secondary | ICD-10-CM

## 2021-10-08 DIAGNOSIS — M1990 Unspecified osteoarthritis, unspecified site: Secondary | ICD-10-CM

## 2021-10-08 DIAGNOSIS — M47816 Spondylosis without myelopathy or radiculopathy, lumbar region: Secondary | ICD-10-CM

## 2021-10-08 DIAGNOSIS — M19042 Primary osteoarthritis, left hand: Secondary | ICD-10-CM

## 2021-10-08 DIAGNOSIS — G629 Polyneuropathy, unspecified: Secondary | ICD-10-CM

## 2021-10-08 DIAGNOSIS — M8589 Other specified disorders of bone density and structure, multiple sites: Secondary | ICD-10-CM

## 2021-10-08 DIAGNOSIS — E559 Vitamin D deficiency, unspecified: Secondary | ICD-10-CM

## 2021-10-08 DIAGNOSIS — K449 Diaphragmatic hernia without obstruction or gangrene: Secondary | ICD-10-CM

## 2021-10-08 DIAGNOSIS — M1380 Other specified arthritis, unspecified site: Secondary | ICD-10-CM

## 2021-10-08 DIAGNOSIS — K760 Fatty (change of) liver, not elsewhere classified: Secondary | ICD-10-CM

## 2021-10-08 DIAGNOSIS — F5101 Primary insomnia: Secondary | ICD-10-CM

## 2021-10-08 DIAGNOSIS — Z96651 Presence of right artificial knee joint: Secondary | ICD-10-CM

## 2021-10-08 LAB — CBC WITH DIFFERENTIAL/PLATELET
Absolute Monocytes: 761 cells/uL (ref 200–950)
Basophils Absolute: 94 cells/uL (ref 0–200)
Basophils Relative: 1 %
Eosinophils Absolute: 141 cells/uL (ref 15–500)
Eosinophils Relative: 1.5 %
HCT: 43.6 % (ref 35.0–45.0)
Hemoglobin: 15 g/dL (ref 11.7–15.5)
Lymphs Abs: 3083 cells/uL (ref 850–3900)
MCH: 32.6 pg (ref 27.0–33.0)
MCHC: 34.4 g/dL (ref 32.0–36.0)
MCV: 94.8 fL (ref 80.0–100.0)
MPV: 10.3 fL (ref 7.5–12.5)
Monocytes Relative: 8.1 %
Neutro Abs: 5320 cells/uL (ref 1500–7800)
Neutrophils Relative %: 56.6 %
Platelets: 273 10*3/uL (ref 140–400)
RBC: 4.6 10*6/uL (ref 3.80–5.10)
RDW: 12.4 % (ref 11.0–15.0)
Total Lymphocyte: 32.8 %
WBC: 9.4 10*3/uL (ref 3.8–10.8)

## 2021-10-09 NOTE — Progress Notes (Signed)
CBC is normal.

## 2021-10-10 ENCOUNTER — Other Ambulatory Visit: Payer: Self-pay | Admitting: Nurse Practitioner

## 2021-10-10 DIAGNOSIS — F5104 Psychophysiologic insomnia: Secondary | ICD-10-CM

## 2021-10-10 NOTE — Telephone Encounter (Signed)
Patient has follow up scheduled with maritza on 10/28/2021

## 2021-10-24 ENCOUNTER — Other Ambulatory Visit: Payer: Self-pay | Admitting: Physician Assistant

## 2021-10-24 DIAGNOSIS — E559 Vitamin D deficiency, unspecified: Secondary | ICD-10-CM

## 2021-10-28 ENCOUNTER — Ambulatory Visit: Payer: BC Managed Care – PPO | Admitting: Physician Assistant

## 2021-10-30 ENCOUNTER — Ambulatory Visit: Payer: BC Managed Care – PPO | Admitting: Physician Assistant

## 2021-10-31 ENCOUNTER — Encounter: Payer: Self-pay | Admitting: Physician Assistant

## 2021-10-31 ENCOUNTER — Ambulatory Visit (INDEPENDENT_AMBULATORY_CARE_PROVIDER_SITE_OTHER): Payer: BC Managed Care – PPO | Admitting: Physician Assistant

## 2021-10-31 VITALS — BP 135/80 | HR 60 | Ht 66.0 in | Wt 188.4 lb

## 2021-10-31 DIAGNOSIS — E7800 Pure hypercholesterolemia, unspecified: Secondary | ICD-10-CM

## 2021-10-31 DIAGNOSIS — F5104 Psychophysiologic insomnia: Secondary | ICD-10-CM

## 2021-10-31 DIAGNOSIS — N1370 Vesicoureteral-reflux, unspecified: Secondary | ICD-10-CM

## 2021-10-31 DIAGNOSIS — E039 Hypothyroidism, unspecified: Secondary | ICD-10-CM

## 2021-10-31 DIAGNOSIS — I10 Essential (primary) hypertension: Secondary | ICD-10-CM | POA: Diagnosis not present

## 2021-10-31 DIAGNOSIS — F4323 Adjustment disorder with mixed anxiety and depressed mood: Secondary | ICD-10-CM

## 2021-10-31 DIAGNOSIS — R8290 Unspecified abnormal findings in urine: Secondary | ICD-10-CM | POA: Diagnosis not present

## 2021-10-31 DIAGNOSIS — E559 Vitamin D deficiency, unspecified: Secondary | ICD-10-CM | POA: Diagnosis not present

## 2021-10-31 LAB — POCT URINALYSIS DIP (CLINITEK)
Bilirubin, UA: NEGATIVE
Glucose, UA: NEGATIVE mg/dL
Ketones, POC UA: NEGATIVE mg/dL
Nitrite, UA: NEGATIVE
Spec Grav, UA: 1.025 (ref 1.010–1.025)
Urobilinogen, UA: 0.2 E.U./dL
pH, UA: 7.5 (ref 5.0–8.0)

## 2021-10-31 MED ORDER — TRAZODONE HCL 50 MG PO TABS: 50.0000 mg | ORAL_TABLET | Freq: Every day | ORAL | 0 refills | Status: DC

## 2021-10-31 NOTE — Assessment & Plan Note (Signed)
-  Followed by endocrinology. Recommend to continue with current medication regimen.

## 2021-10-31 NOTE — Assessment & Plan Note (Signed)
-  BP elevated on intake and improved after repeat, at goal <140/90. Recommend to continue with non-pharmacologic therapy. Will collect CMP to monitor renal function.

## 2021-10-31 NOTE — Progress Notes (Signed)
Established patient visit   Patient: Marissa Larson   DOB: 12-Jul-1955   66 y.o. Female  MRN: [id number] Visit Date: 10/31/2021  Chief Complaint  Patient presents with   Follow-up   Subjective    HPI  Patient presents for chronic follow-up visit. Patient has a hx of ureteral reflux and at times has flank pain, denies urinary symptoms of dysuria.  Insomnia: Patient reports Marissa Larson has not been helpful for sleep. Reports had a prior rx for Trazodone 50 mg which she has been trying and has actually helped with her sleep. Sleeping better and feels better as well, not as tired.  HTN: No chest pain, palpitations, dizziness, syncope or lower extremity swelling. Patient has not checked blood pressure at home.  Thyroid: Followed by endocrinology. Taking Synthroid 100 mcg without issues.  Mood: Patient reports mood has been stable and leveled. No severe anxiety, depressive symptomatology or labile mood. Taking Lexapro 20 mg without issues.      10/31/2021    9:19 AM 05/09/2021    9:53 AM 03/27/2021    3:36 PM 01/03/2021   11:14 AM 12/19/2020    2:04 PM  Depression screen PHQ 2/9  Decreased Interest 0 0 0 0 0  Down, Depressed, Hopeless 0 0 0 0 0  PHQ - 2 Score 0 0 0 0 0  Altered sleeping 1 3 0 0 2  Tired, decreased energy 1 3 0 0 1  Change in appetite 0 0 0 0 0  Feeling bad or failure about yourself  0 0 0 0 0  Trouble concentrating 0 0 0 0 0  Moving slowly or fidgety/restless 0 0 0 0 0  Suicidal thoughts 0 0 0 0 0  PHQ-9 Score 2 6 0 0 3  Difficult doing work/chores  Not difficult at all Not difficult at all Not difficult at all       10/31/2021    9:20 AM 03/27/2021    3:36 PM 01/03/2021   11:14 AM 12/19/2020    2:06 PM  GAD 7 : Generalized Anxiety Score  Nervous, Anxious, on Edge 0 0 0 0  Control/stop worrying 0 0 0 0  Worry too much - different things 0 0 0 0  Trouble relaxing 0 0 0 0  Restless 0 0 0 0  Easily annoyed or irritable 0 0 0 0  Afraid - awful might happen 0 0  0 0  Total GAD 7 Score 0 0 0 0  Anxiety Difficulty  Not difficult at all Not difficult at all         Medications: Outpatient Medications Prior to Visit  Medication Sig   CREAM BASE EX Apply 1 application topically daily. BIEST Transdermal Cream 0.5 mg: Compounded bio-identical Estriol (E3) combined by [place]   escitalopram (LEXAPRO) 20 MG tablet TAKE 1 TABLET BY MOUTH DAILY.   famotidine (PEPCID) 40 MG tablet Take 40 mg by mouth daily as needed (for heartburn/indigestion.).   Flaxseed, Linseed, (FLAX SEED OIL) 1000 MG CAPS Take 2,000 mg by mouth every evening.   Magnesium 500 MG CAPS Take 1,000 mg by mouth every evening.   meloxicam (MOBIC) 7.5 MG tablet TAKE 1 TABLET (7.5 MG TOTAL) BY MOUTH DAILY.   NONFORMULARY OR COMPOUNDED ITEM Take 150 mg by mouth at bedtime. Bio identical Progesterone 150 ([facility])   Omega-3 Fatty Acids (FISH OIL) 1200 MG CAPS Take 2,400 mg by mouth every evening.   SYNTHROID 100 MCG tablet TAKE 1 TABLET DAILY BEFORE  BREAKFAST   Vitamin D, Ergocalciferol, (DRISDOL) 1.25 MG (50000 UNIT) CAPS capsule TAKE 1 CAPSULE BY MOUTH EVERY 7 DAYS.   [DISCONTINUED] eszopiclone (LUNESTA) 1 MG TABS tablet TAKE 1 TABLET BY MOUTH AT BEDTIME AS NEEDED. (Patient not taking: Reported on 10/31/2021)   [DISCONTINUED] hydrOXYzine (ATARAX) 25 MG tablet TAKE 1 TABLET (25 MG TOTAL) BY MOUTH AT BEDTIME AS NEEDED. (Patient not taking: Reported on 10/31/2021)   No facility-administered medications prior to visit.    Review of Systems Review of Systems:  A fourteen system review of systems was performed and found to be positive as per HPI.  Last CBC Lab Results  Component Value Date   WBC 9.4 10/08/2021   HGB 15.0 10/08/2021   HCT 43.6 10/08/2021   MCV 94.8 10/08/2021   MCH 32.6 10/08/2021   RDW 12.4 10/08/2021   PLT 273 29/52/8413   Last metabolic panel Lab Results  Component Value Date   GLUCOSE 102 (H) 12/11/2020   NA 142 12/11/2020   K 4.3  12/11/2020   CL 102 12/11/2020   CO2 28 12/11/2020   BUN 15 12/11/2020   CREATININE 0.64 12/11/2020   EGFR 99 12/11/2020   CALCIUM 10.1 12/11/2020   PROT 7.0 12/11/2020   ALBUMIN 4.8 12/11/2020   LABGLOB 2.2 12/11/2020   AGRATIO 2.2 12/11/2020   BILITOT 0.4 12/11/2020   ALKPHOS 105 12/11/2020   AST 34 12/11/2020   ALT 28 12/11/2020   ANIONGAP 10 10/13/2019   Last lipids Lab Results  Component Value Date   CHOL 220 (H) 12/11/2020   HDL 86 12/11/2020   LDLCALC 113 (H) 12/11/2020   TRIG 120 12/11/2020   CHOLHDL 2.6 12/11/2020   Last hemoglobin A1c Lab Results  Component Value Date   HGBA1C 5.9 04/08/2021   Last thyroid functions Lab Results  Component Value Date   TSH 1.83 05/20/2021   T3TOTAL 136 12/11/2020   Last vitamin D Lab Results  Component Value Date   VD25OH 46.2 12/11/2020     Objective    BP 135/80   Pulse 60   Ht 5' 6" (1.676 m)   Wt 188 lb 6.4 oz (85.5 kg)   SpO2 97%   BMI 30.41 kg/m  BP Readings from Last 3 Encounters:  10/31/21 135/80  10/08/21 (!) 148/78  05/09/21 (!) 146/82   Wt Readings from Last 3 Encounters:  10/31/21 188 lb 6.4 oz (85.5 kg)  10/08/21 192 lb 12.8 oz (87.5 kg)  05/09/21 194 lb (88 kg)    Physical Exam  General:  Well Developed, well nourished, appropriate for stated age.  Neuro:  Alert and oriented,  extra-ocular muscles intact  HEENT:  Normocephalic, atraumatic, neck supple  Skin:  no gross rash, warm, pink. Cardiac:  RRR, S1 S2 Respiratory: CTA B/L  Vascular:  Ext warm, no cyanosis apprec.; cap RF less 2 sec. Psych:  No HI/SI, judgement and insight good, Euthymic mood. Full Affect.   Results for orders placed or performed in visit on 10/31/21  POCT URINALYSIS DIP (CLINITEK)  Result Value Ref Range   Color, UA yellow yellow   Clarity, UA clear clear   Glucose, UA negative negative mg/dL   Bilirubin, UA negative negative   Ketones, POC UA negative negative mg/dL   Spec Grav, UA 1.025 1.010 - 1.025    Blood, UA small (A) negative   pH, UA 7.5 5.0 - 8.0   POC PROTEIN,UA trace negative, trace   Urobilinogen, UA 0.2 0.2 or 1.0 E.U./dL  Nitrite, UA Negative Negative   Leukocytes, UA Large (3+) (A) Negative    Assessment & Plan      Problem List Items Addressed This Visit       Cardiovascular and Mediastinum   HTN (hypertension) (Chronic)    -BP elevated on intake and improved after repeat, at goal <140/90. Recommend to continue with non-pharmacologic therapy. Will collect CMP to monitor renal function.      Relevant Orders   Hemoglobin A1c   CBC with Differential/Platelet   Comprehensive metabolic panel     Endocrine   Hypothyroidism (Chronic)    -Followed by endocrinology. Recommend to continue with current medication regimen.      Relevant Orders   TSH     Other   Vitamin D deficiency (Chronic)   Relevant Orders   Vitamin D (25 hydroxy)   Adjustment disorder with mixed anxiety and depressed mood (Chronic)    -Controlled. Recommend to continue Lexapro 20 mg daily.      Chronic insomnia - Primary    -Improved with Trazodone 50 mg so will discontinue Lunesta and hydroxyzine. Good sleep hygiene discussed.      Relevant Medications   traZODone (DESYREL) 50 MG tablet   Elevated LDL cholesterol level   Relevant Orders   Lipid panel   Other Visit Diagnoses     Vesico-ureteral reflux       Relevant Orders   POCT URINALYSIS DIP (CLINITEK) (Completed)   Abnormal urinalysis       Relevant Orders   Urine Culture      Vesico-uretral reflux: -UA obtained and positive for small blood, trace of protein and large leukocytes. Will send for urine culture to r/o UTI.  Patient is fasting so will obtain routine fasting labs.  Return for CPE after 05/10/2022.        Lorrene Reid, PA-C  [facility] [phone] (phone) [phone] (fax)  [place]

## 2021-10-31 NOTE — Assessment & Plan Note (Signed)
-  Controlled. Recommend to continue Lexapro 20 mg daily.

## 2021-10-31 NOTE — Assessment & Plan Note (Signed)
-  Improved with Trazodone 50 mg so will discontinue Lunesta and hydroxyzine. Good sleep hygiene discussed.

## 2021-11-01 LAB — COMPREHENSIVE METABOLIC PANEL
ALT: 17 IU/L (ref 0–32)
AST: 20 IU/L (ref 0–40)
Albumin/Globulin Ratio: 2.6 — ABNORMAL HIGH (ref 1.2–2.2)
Albumin: 4.9 g/dL (ref 3.9–4.9)
Alkaline Phosphatase: 103 IU/L (ref 44–121)
BUN/Creatinine Ratio: 29 — ABNORMAL HIGH (ref 12–28)
BUN: 15 mg/dL (ref 8–27)
Bilirubin Total: 0.5 mg/dL (ref 0.0–1.2)
CO2: 24 mmol/L (ref 20–29)
Calcium: 9.6 mg/dL (ref 8.7–10.3)
Chloride: 103 mmol/L (ref 96–106)
Creatinine, Ser: 0.52 mg/dL — ABNORMAL LOW (ref 0.57–1.00)
Globulin, Total: 1.9 g/dL (ref 1.5–4.5)
Glucose: 104 mg/dL — ABNORMAL HIGH (ref 70–99)
Potassium: 4.1 mmol/L (ref 3.5–5.2)
Sodium: 141 mmol/L (ref 134–144)
Total Protein: 6.8 g/dL (ref 6.0–8.5)
eGFR: 104 mL/min/{1.73_m2} (ref >59)

## 2021-11-01 LAB — CBC WITH DIFFERENTIAL/PLATELET
Basophils Absolute: 0.1 10*3/uL (ref 0.0–0.2)
Basos: 1 %
EOS (ABSOLUTE): 0.1 10*3/uL (ref 0.0–0.4)
Eos: 2 %
Hematocrit: 41 % (ref 34.0–46.6)
Hemoglobin: 14.3 g/dL (ref 11.1–15.9)
Immature Grans (Abs): 0 10*3/uL (ref 0.0–0.1)
Immature Granulocytes: 0 %
Lymphocytes Absolute: 2.1 10*3/uL (ref 0.7–3.1)
Lymphs: 28 %
MCH: 32.3 pg (ref 26.6–33.0)
MCHC: 34.9 g/dL (ref 31.5–35.7)
MCV: 93 fL (ref 79–97)
Monocytes Absolute: 0.5 10*3/uL (ref 0.1–0.9)
Monocytes: 6 %
Neutrophils Absolute: 4.8 10*3/uL (ref 1.4–7.0)
Neutrophils: 63 %
Platelets: 252 10*3/uL (ref 150–450)
RBC: 4.43 x10E6/uL (ref 3.77–5.28)
RDW: 12.1 % (ref 11.7–15.4)
WBC: 7.6 10*3/uL (ref 3.4–10.8)

## 2021-11-01 LAB — LIPID PANEL
Chol/HDL Ratio: 2.6 ratio (ref 0.0–4.4)
Cholesterol, Total: 224 mg/dL — ABNORMAL HIGH (ref 100–199)
HDL: 87 mg/dL (ref >39)
LDL Chol Calc (NIH): 117 mg/dL — ABNORMAL HIGH (ref 0–99)
Triglycerides: 119 mg/dL (ref 0–149)
VLDL Cholesterol Cal: 20 mg/dL (ref 5–40)

## 2021-11-01 LAB — HEMOGLOBIN A1C
Est. average glucose Bld gHb Est-mCnc: 117 mg/dL
Hgb A1c MFr Bld: 5.7 % — ABNORMAL HIGH (ref 4.8–5.6)

## 2021-11-01 LAB — TSH: TSH: 0.469 u[IU]/mL (ref 0.450–4.500)

## 2021-11-01 LAB — VITAMIN D 25 HYDROXY (VIT D DEFICIENCY, FRACTURES): Vit D, 25-Hydroxy: 45.5 ng/mL (ref 30.0–100.0)

## 2021-11-04 ENCOUNTER — Other Ambulatory Visit: Payer: Self-pay | Admitting: Physician Assistant

## 2021-11-04 DIAGNOSIS — N3091 Cystitis, unspecified with hematuria: Secondary | ICD-10-CM

## 2021-11-04 LAB — URINE CULTURE

## 2021-11-04 MED ORDER — CIPROFLOXACIN HCL 500 MG PO TABS: 500.0000 mg | ORAL_TABLET | Freq: Every day | ORAL | 0 refills | Status: AC

## 2021-11-05 ENCOUNTER — Other Ambulatory Visit: Payer: Self-pay | Admitting: Physician Assistant

## 2021-11-05 DIAGNOSIS — E559 Vitamin D deficiency, unspecified: Secondary | ICD-10-CM

## 2021-11-07 DIAGNOSIS — Z5181 Encounter for therapeutic drug level monitoring: Secondary | ICD-10-CM | POA: Diagnosis not present

## 2021-11-07 DIAGNOSIS — Z791 Long term (current) use of non-steroidal anti-inflammatories (NSAID): Secondary | ICD-10-CM | POA: Diagnosis not present

## 2021-11-07 DIAGNOSIS — R197 Diarrhea, unspecified: Secondary | ICD-10-CM | POA: Diagnosis not present

## 2021-11-07 DIAGNOSIS — M25532 Pain in left wrist: Secondary | ICD-10-CM | POA: Diagnosis not present

## 2021-11-08 ENCOUNTER — Other Ambulatory Visit: Payer: Self-pay | Admitting: Nurse Practitioner

## 2021-11-08 DIAGNOSIS — F5104 Psychophysiologic insomnia: Secondary | ICD-10-CM

## 2021-11-11 ENCOUNTER — Other Ambulatory Visit: Payer: Self-pay | Admitting: Physician Assistant

## 2021-11-11 DIAGNOSIS — E559 Vitamin D deficiency, unspecified: Secondary | ICD-10-CM

## 2021-11-15 ENCOUNTER — Telehealth: Payer: Self-pay | Admitting: *Deleted

## 2021-11-15 DIAGNOSIS — E559 Vitamin D deficiency, unspecified: Secondary | ICD-10-CM

## 2021-11-15 NOTE — Telephone Encounter (Signed)
Pt called and she was inquiring about her Vitamin D Rx.  Informed her that the last 2 had been denied.  Informed her that her last lab did not show it low and that may be why.  Please advise. Nahum Sherrer Zimmerman Rumple, CMA

## 2021-11-18 MED ORDER — VITAMIN D (ERGOCALCIFEROL) 1.25 MG (50000 UNIT) PO CAPS: 50000.0000 [IU] | ORAL_CAPSULE | ORAL | 3 refills | Status: DC

## 2021-11-18 NOTE — Telephone Encounter (Signed)
Refill for prescription Vitamin D has been sent to [facility]. LVM informing patient that script has been sent.

## 2021-11-22 ENCOUNTER — Other Ambulatory Visit: Payer: Self-pay | Admitting: Rheumatology

## 2021-11-22 NOTE — Telephone Encounter (Signed)
Next Visit: 04/09/2022  Last Visit: 10/08/2021  Last Fill: 08/30/2021  DX: Inflammatory arthritis   Current Dose per office note 10/08/2021: meloxicam 7.5 mg 1 tablet at bedtime.   Labs: 10/31/2021 Creat. 0.52, BUN/Creat. Ratio 29, Albumin/Globulin Ratio 2.6  Okay to refill Mobic?

## 2021-12-10 ENCOUNTER — Other Ambulatory Visit: Payer: Self-pay | Admitting: Nurse Practitioner

## 2021-12-10 DIAGNOSIS — F4323 Adjustment disorder with mixed anxiety and depressed mood: Secondary | ICD-10-CM

## 2022-01-03 DIAGNOSIS — M19032 Primary osteoarthritis, left wrist: Secondary | ICD-10-CM | POA: Diagnosis not present

## 2022-01-03 DIAGNOSIS — M25832 Other specified joint disorders, left wrist: Secondary | ICD-10-CM | POA: Diagnosis not present

## 2022-01-08 DIAGNOSIS — Z0389 Encounter for observation for other suspected diseases and conditions ruled out: Secondary | ICD-10-CM | POA: Diagnosis not present

## 2022-01-08 DIAGNOSIS — K439 Ventral hernia without obstruction or gangrene: Secondary | ICD-10-CM | POA: Diagnosis not present

## 2022-01-29 ENCOUNTER — Other Ambulatory Visit: Payer: Self-pay | Admitting: Nurse Practitioner

## 2022-01-29 DIAGNOSIS — F5104 Psychophysiologic insomnia: Secondary | ICD-10-CM

## 2022-01-29 NOTE — Telephone Encounter (Signed)
L.O.V: 10/31/21  N.O.V: 05/12/22  L.R.F: 10/31/21 90 tab 0 refill   Refill sent

## 2022-02-19 ENCOUNTER — Other Ambulatory Visit: Payer: Self-pay | Admitting: Rheumatology

## 2022-02-19 DIAGNOSIS — R1010 Upper abdominal pain, unspecified: Secondary | ICD-10-CM | POA: Diagnosis not present

## 2022-02-19 DIAGNOSIS — K219 Gastro-esophageal reflux disease without esophagitis: Secondary | ICD-10-CM | POA: Diagnosis not present

## 2022-02-19 NOTE — Telephone Encounter (Signed)
Next Visit: 04/09/2022  Last Visit: 10/08/2021  Last Fill: 11/22/2021  DX: Inflammatory arthritis-patient had history of chronic inflammatory arthritis.   Current Dose per office note 10/08/2021: meloxicam 7.5 mg 1 tablet at bedtime   Labs: 10/31/2021 Creat. 0.52, BUN/Creat. Ratio 29, Albumin/Globulin Ratio 2.6  Okay to refill Mobic?

## 2022-03-05 ENCOUNTER — Other Ambulatory Visit: Payer: Self-pay | Admitting: Internal Medicine

## 2022-03-10 ENCOUNTER — Other Ambulatory Visit: Payer: Self-pay | Admitting: Nurse Practitioner

## 2022-03-10 DIAGNOSIS — F4323 Adjustment disorder with mixed anxiety and depressed mood: Secondary | ICD-10-CM

## 2022-03-10 DIAGNOSIS — E559 Vitamin D deficiency, unspecified: Secondary | ICD-10-CM

## 2022-03-26 NOTE — Progress Notes (Deleted)
Office Visit Note  Patient: Marissa Larson             Date of Birth: [birth year]           MRN: [id number]             PCP: Lorrene Reid, PA-C (Inactive) Referring: Lorrene Reid, PA-C Visit Date: 04/09/2022 Occupation: @GUAROCC @  Subjective:  No chief complaint on file.   History of Present Illness: Marissa Larson is a 67 y.o. female ***     Activities of Daily Living:  Patient reports morning stiffness for *** {minute/hour:19697}.   Patient {ACTIONS;DENIES/REPORTS:21021675::"Denies"} nocturnal pain.  Difficulty dressing/grooming: {ACTIONS;DENIES/REPORTS:21021675::"Denies"} Difficulty climbing stairs: {ACTIONS;DENIES/REPORTS:21021675::"Denies"} Difficulty getting out of chair: {ACTIONS;DENIES/REPORTS:21021675::"Denies"} Difficulty using hands for taps, buttons, cutlery, and/or writing: {ACTIONS;DENIES/REPORTS:21021675::"Denies"}  No Rheumatology ROS completed.   PMFS History:  Patient Active Problem List   Diagnosis Date Noted  . Bacterial conjunctivitis 12/21/2020  . Acute non-recurrent pansinusitis 12/21/2020  . Vasomotor rhinitis 12/19/2020  . Elevated LDL cholesterol level 07/19/2018  . Prediabetes 07/19/2018  . Paresthesia 12/09/2017  . Chronic insomnia 11/24/2017  . Insomnia secondary to chronic pain 11/24/2017  . Complaint related to dreams 11/24/2017  . 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 Neuropathy 08/18/2017  . 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 Obesity, Class I, BMI 30-34.9 12/03/2016  . Kidney stones 09/12/2016  . Nausea 09/12/2016  . High risk medication use 07/30/2016  . NAFLD (nonalcoholic fatty liver disease) 07/30/2016  . Primary osteoarthritis of both feet 04/12/2016  . History of kidney stones 04/12/2016  . Primary osteoarthritis of both hands 04/11/2016  . Bilateral primary osteoarthritis of hip 04/11/2016  . Primary osteoarthritis  of both knees 04/11/2016  . Spondylosis of lumbar region without myelopathy or radiculopathy 04/11/2016  . Hyperuricemia 04/11/2016  . Primary insomnia 04/11/2016  . Flank pain 04/02/2016  . Sinusitis 04/02/2016  . Elevated HDL 11/11/2015  . Elevated LFTs 11/11/2015  . Adjustment disorder with mixed anxiety and depressed mood 11/11/2015  . Dysuria 11/11/2015  . Hypothyroidism 10/14/2015  . Pseudogout involving multiple joints 10/14/2015  . Vitamin D deficiency 10/14/2015  . Hormone replacement therapy- per GYN 10/14/2015  . HTN (hypertension) 10/14/2015  . Fatigue 10/14/2015  . h/o Hiatal hernia 10/14/2015  . Sleep difficulties 10/14/2015  . Generalized OA 10/14/2015  . Counseling on health promotion and disease prevention 10/14/2015  . 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 Age-related nuclear cataract of both eyes 01/25/2015  . Posterior vitreous detachment of left eye 01/25/2015  . chronic Palpitations 07/19/2013    Past Medical History:  Diagnosis Date  . Arthritis   . Chest pain 06/2013   Left sided  . Chronic leg pain 06/2013  . Complication of anesthesia   . Dysrhythmia   . Fatigue   . GERD (gastroesophageal reflux disease)   . History of kidney stones   . Hypertension   . Hypothyroidism   . Nonproductive cough 06/2013  . Overweight 01/18/2014  . Peripheral neuropathy   . PONV (postoperative nausea and vomiting)   . Sleep difficulties    Change in sleep patterns  . SOB (shortness of breath) 06/2013  . Tachycardia 06/2013  . Thyroid disease    hypo  . UTI (lower urinary tract infection)   . Vesico-ureteric reflux     Family History  Problem Relation Age of Onset  . Migraines Other  Fam Hx   . Heart disease Other        Fam Hx  . Thyroid disease Other        Fam Hx - She also has thyroid disease  . Diabetes Other        Fam Hx  . Arthritis Other        Fam Hx  . Gallbladder disease Other        Fam Hx  . Cancer Other        Fam Hx - Pancreatic   . Hypertension Other        Fam Hx  . Heart attack Cousin        Multiple cousins with MI's before 70 years of age  . Heart disease Mother 64  . Hyperlipidemia Mother 23  . Hypertension Mother 50  . Stroke Mother 78  . Heart disease Maternal Grandmother   . Cancer Father 63       pancreatic  . Diabetes Brother 49  . Heart attack Maternal Uncle   . Heart attack Paternal Aunt    Past Surgical History:  Procedure Laterality Date  . ABDOMINAL SURGERY  1994  . CYSTOSCOPY/URETEROSCOPY/HOLMIUM LASER/STENT PLACEMENT Left 09/22/2017   Procedure: CYSTOSCOPY/RETROGRADE/URETEROSCOPY/ BASKET STONE EXTRACTION/STENT PLACEMENT;  Surgeon: Ceasar Mons, MD;  Location: [facility];  Service: Urology;  Laterality: Left;  ONLY NEEDS 60 MIN  . FOOT SURGERY Right 2009  . KIDNEY SURGERY Left 1993  . KNEE ARTHROPLASTY Right 11/02/2019  . KNEE SURGERY     Multiple knee surgeries  . OOPHORECTOMY Left 1994  . 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 Noemi Chapel  . small fiber neuropathy biopsy  12/2017  . TONSILLECTOMY    . URETEROSCOPY VIA URETEROSTOMY Left 1994   Social History   Social History Narrative  . Not on file   Immunization History  Administered Date(s) Administered  . Tdap 01/07/2011     Objective: Vital Signs: There were no vitals taken for this visit.   Physical Exam   Musculoskeletal Exam: ***  CDAI Exam: CDAI Score: -- Patient Global: --; Provider Global: -- Swollen: --; Tender: -- Joint Exam 04/09/2022   No joint exam has been documented for this visit   There is currently no information documented on the homunculus. Go to the Rheumatology activity and complete the homunculus joint exam.  Investigation: No additional findings.  Imaging: No results found.  Recent Labs: Lab Results  Component Value Date   WBC 7.6 10/31/2021   HGB 14.3 10/31/2021   PLT 252 10/31/2021   NA 141 10/31/2021   K 4.1 10/31/2021   CL 103 10/31/2021   CO2 24 10/31/2021    GLUCOSE 104 (H) 10/31/2021   BUN 15 10/31/2021   CREATININE 0.52 (L) 10/31/2021   BILITOT 0.5 10/31/2021   ALKPHOS 103 10/31/2021   AST 20 10/31/2021   ALT 17 10/31/2021   PROT 6.8 10/31/2021   ALBUMIN 4.9 10/31/2021   CALCIUM 9.6 10/31/2021   GFRAA 108 08/17/2019   QFTBGOLDPLUS NEGATIVE 02/16/2017    Speciality Comments: PLQ eye exam: 04/20/2019 normal. Dr. Satira Sark. Follow up in 1 year.  Procedures:  No procedures performed Allergies: Penicillins and Sulfa antibiotics   Assessment / Plan:     Visit Diagnoses: Inflammatory arthritis  Medication management  Pseudogout involving multiple joints  Primary osteoarthritis of both hands  Bilateral primary osteoarthritis of hip  S/P total knee arthroplasty, right  S/P total knee replacement, left  Primary osteoarthritis of both feet  Spondylosis of lumbar region without myelopathy or radiculopathy  Osteopenia of multiple sites  Hyperuricemia  Small fiber neuropathy  NAFLD (nonalcoholic fatty liver disease)  History of kidney stones  Vitamin D deficiency  Essential hypertension  Primary insomnia  h/o Hiatal hernia  Orders: No orders of the defined types were placed in this encounter.  No orders of the defined types were placed in this encounter.   Face-to-face time spent with patient was *** minutes. Greater than 50% of time was spent in counseling and coordination of care.  Follow-Up Instructions: No follow-ups on file.   Ofilia Neas, PA-C  Note - This record has been created using Dragon software.  Chart creation errors have been sought, but may not always  have been located. Such creation errors do not reflect on  the standard of medical care.

## 2022-03-31 DIAGNOSIS — N3020 Other chronic cystitis without hematuria: Secondary | ICD-10-CM | POA: Diagnosis not present

## 2022-04-02 ENCOUNTER — Other Ambulatory Visit (HOSPITAL_COMMUNITY): Payer: Self-pay | Admitting: Urology

## 2022-04-02 DIAGNOSIS — Q625 Duplication of ureter: Secondary | ICD-10-CM

## 2022-04-02 DIAGNOSIS — N3020 Other chronic cystitis without hematuria: Secondary | ICD-10-CM

## 2022-04-09 ENCOUNTER — Other Ambulatory Visit: Payer: Self-pay | Admitting: Nurse Practitioner

## 2022-04-09 ENCOUNTER — Ambulatory Visit: Payer: BC Managed Care – PPO | Admitting: Rheumatology

## 2022-04-09 DIAGNOSIS — M8589 Other specified disorders of bone density and structure, multiple sites: Secondary | ICD-10-CM

## 2022-04-09 DIAGNOSIS — I10 Essential (primary) hypertension: Secondary | ICD-10-CM

## 2022-04-09 DIAGNOSIS — M1990 Unspecified osteoarthritis, unspecified site: Secondary | ICD-10-CM

## 2022-04-09 DIAGNOSIS — M19071 Primary osteoarthritis, right ankle and foot: Secondary | ICD-10-CM

## 2022-04-09 DIAGNOSIS — E790 Hyperuricemia without signs of inflammatory arthritis and tophaceous disease: Secondary | ICD-10-CM

## 2022-04-09 DIAGNOSIS — Z87442 Personal history of urinary calculi: Secondary | ICD-10-CM

## 2022-04-09 DIAGNOSIS — E559 Vitamin D deficiency, unspecified: Secondary | ICD-10-CM

## 2022-04-09 DIAGNOSIS — Z96651 Presence of right artificial knee joint: Secondary | ICD-10-CM

## 2022-04-09 DIAGNOSIS — M1189 Other specified crystal arthropathies, multiple sites: Secondary | ICD-10-CM

## 2022-04-09 DIAGNOSIS — K760 Fatty (change of) liver, not elsewhere classified: Secondary | ICD-10-CM

## 2022-04-09 DIAGNOSIS — F5101 Primary insomnia: Secondary | ICD-10-CM

## 2022-04-09 DIAGNOSIS — Z79899 Other long term (current) drug therapy: Secondary | ICD-10-CM

## 2022-04-09 DIAGNOSIS — M160 Bilateral primary osteoarthritis of hip: Secondary | ICD-10-CM

## 2022-04-09 DIAGNOSIS — M47816 Spondylosis without myelopathy or radiculopathy, lumbar region: Secondary | ICD-10-CM

## 2022-04-09 DIAGNOSIS — G629 Polyneuropathy, unspecified: Secondary | ICD-10-CM

## 2022-04-09 DIAGNOSIS — K449 Diaphragmatic hernia without obstruction or gangrene: Secondary | ICD-10-CM

## 2022-04-09 DIAGNOSIS — Z96652 Presence of left artificial knee joint: Secondary | ICD-10-CM

## 2022-04-09 DIAGNOSIS — M19042 Primary osteoarthritis, left hand: Secondary | ICD-10-CM

## 2022-04-14 DIAGNOSIS — K2950 Unspecified chronic gastritis without bleeding: Secondary | ICD-10-CM | POA: Diagnosis not present

## 2022-04-14 DIAGNOSIS — K219 Gastro-esophageal reflux disease without esophagitis: Secondary | ICD-10-CM | POA: Diagnosis not present

## 2022-04-14 DIAGNOSIS — K3189 Other diseases of stomach and duodenum: Secondary | ICD-10-CM | POA: Diagnosis not present

## 2022-04-14 DIAGNOSIS — R1010 Upper abdominal pain, unspecified: Secondary | ICD-10-CM | POA: Diagnosis not present

## 2022-04-15 ENCOUNTER — Ambulatory Visit: Payer: BC Managed Care – PPO | Admitting: Internal Medicine

## 2022-04-16 ENCOUNTER — Inpatient Hospital Stay (HOSPITAL_COMMUNITY): Admission: RE | Admit: 2022-04-16 | Payer: BC Managed Care – PPO | Source: Ambulatory Visit

## 2022-04-17 NOTE — Progress Notes (Deleted)
Office Visit Note  Patient: Marissa Larson             Date of Birth: [birth year]           MRN: [id number]             PCP: Melida Quitter, PA Referring: Mayer Masker, PA-C Visit Date: 04/30/2022 Occupation: @GUAROCC @  Subjective:  No chief complaint on file.   History of Present Illness: Marissa Larson is a 67 y.o. female ***     Activities of Daily Living:  Patient reports morning stiffness for *** {minute/hour:19697}.   Patient {ACTIONS;DENIES/REPORTS:21021675::"Denies"} nocturnal pain.  Difficulty dressing/grooming: {ACTIONS;DENIES/REPORTS:21021675::"Denies"} Difficulty climbing stairs: {ACTIONS;DENIES/REPORTS:21021675::"Denies"} Difficulty getting out of chair: {ACTIONS;DENIES/REPORTS:21021675::"Denies"} Difficulty using hands for taps, buttons, cutlery, and/or writing: {ACTIONS;DENIES/REPORTS:21021675::"Denies"}  No Rheumatology ROS completed.   PMFS History:  Patient Active Problem List   Diagnosis Date Noted  . Bacterial conjunctivitis 12/21/2020  . Acute non-recurrent pansinusitis 12/21/2020  . Vasomotor rhinitis 12/19/2020  . Elevated LDL cholesterol level 07/19/2018  . Prediabetes 07/19/2018  . Paresthesia 12/09/2017  . Chronic insomnia 11/24/2017  . Insomnia secondary to chronic pain 11/24/2017  . Complaint related to dreams 11/24/2017  . 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 Neuropathy 08/18/2017  . 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 Obesity, Class I, BMI 30-34.9 12/03/2016  . Kidney stones 09/12/2016  . Nausea 09/12/2016  . High risk medication use 07/30/2016  . NAFLD (nonalcoholic fatty liver disease) 89/38/1017  . Primary osteoarthritis of both feet 04/12/2016  . History of kidney stones 04/12/2016  . Primary osteoarthritis of both hands 04/11/2016  . Bilateral primary osteoarthritis of hip 04/11/2016  . Primary osteoarthritis of both knees  04/11/2016  . Spondylosis of lumbar region without myelopathy or radiculopathy 04/11/2016  . Hyperuricemia 04/11/2016  . Primary insomnia 04/11/2016  . Flank pain 04/02/2016  . Sinusitis 04/02/2016  . Elevated HDL 11/11/2015  . Elevated LFTs 11/11/2015  . Adjustment disorder with mixed anxiety and depressed mood 11/11/2015  . Dysuria 11/11/2015  . Hypothyroidism 10/14/2015  . Pseudogout involving multiple joints 10/14/2015  . Vitamin D deficiency 10/14/2015  . Hormone replacement therapy- per GYN 10/14/2015  . HTN (hypertension) 10/14/2015  . Fatigue 10/14/2015  . h/o Hiatal hernia 10/14/2015  . Sleep difficulties 10/14/2015  . Generalized OA 10/14/2015  . Counseling on health promotion and disease prevention 10/14/2015  . 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 Age-related nuclear cataract of both eyes 01/25/2015  . Posterior vitreous detachment of left eye 01/25/2015  . chronic Palpitations 07/19/2013    Past Medical History:  Diagnosis Date  . Arthritis   . Chest pain 06/2013   Left sided  . Chronic leg pain 06/2013  . Complication of anesthesia   . Dysrhythmia   . Fatigue   . GERD (gastroesophageal reflux disease)   . History of kidney stones   . Hypertension   . Hypothyroidism   . Nonproductive cough 06/2013  . Overweight 01/18/2014  . Peripheral neuropathy   . PONV (postoperative nausea and vomiting)   . Sleep difficulties    Change in sleep patterns  . SOB (shortness of breath) 06/2013  . Tachycardia 06/2013  . Thyroid disease    hypo  . UTI (lower urinary tract infection)   . Vesico-ureteric reflux     Family History  Problem Relation Age of Onset  . Migraines Other  Fam Hx   . Heart disease Other        Fam Hx  . Thyroid disease Other        Fam Hx - She also has thyroid disease  . Diabetes Other        Fam Hx  . Arthritis Other        Fam Hx  . Gallbladder disease Other        Fam Hx  . Cancer Other        Fam Hx - Pancreatic  .  Hypertension Other        Fam Hx  . Heart attack Cousin        Multiple cousins with MI's before 32 years of age  . Heart disease Mother 32  . Hyperlipidemia Mother 56  . Hypertension Mother 85  . Stroke Mother 20  . Heart disease Maternal Grandmother   . Cancer Father 66       pancreatic  . Diabetes Brother 50  . Heart attack Maternal Uncle   . Heart attack Paternal Aunt    Past Surgical History:  Procedure Laterality Date  . ABDOMINAL SURGERY  1994  . CYSTOSCOPY/URETEROSCOPY/HOLMIUM LASER/STENT PLACEMENT Left 09/22/2017   Procedure: CYSTOSCOPY/RETROGRADE/URETEROSCOPY/ BASKET STONE EXTRACTION/STENT PLACEMENT;  Surgeon: Rene Paci, MD;  Location: [facility];  Service: Urology;  Laterality: Left;  ONLY NEEDS 60 MIN  . FOOT SURGERY Right 2009  . KIDNEY SURGERY Left 1993  . KNEE ARTHROPLASTY Right 11/02/2019  . KNEE SURGERY     Multiple knee surgeries  . OOPHORECTOMY Left 1994  . 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 Thurston Hole  . small fiber neuropathy biopsy  12/2017  . TONSILLECTOMY    . URETEROSCOPY VIA URETEROSTOMY Left 1994   Social History   Social History Narrative  . Not on file   Immunization History  Administered Date(s) Administered  . Tdap 01/07/2011     Objective: Vital Signs: There were no vitals taken for this visit.   Physical Exam   Musculoskeletal Exam: ***  CDAI Exam: CDAI Score: -- Patient Global: --; Provider Global: -- Swollen: --; Tender: -- Joint Exam 04/30/2022   No joint exam has been documented for this visit   There is currently no information documented on the homunculus. Go to the Rheumatology activity and complete the homunculus joint exam.  Investigation: No additional findings.  Imaging: No results found.  Recent Labs: Lab Results  Component Value Date   WBC 7.6 10/31/2021   HGB 14.3 10/31/2021   PLT 252 10/31/2021   NA 141 10/31/2021   K 4.1 10/31/2021   CL 103 10/31/2021   CO2 24 10/31/2021    GLUCOSE 104 (H) 10/31/2021   BUN 15 10/31/2021   CREATININE 0.52 (L) 10/31/2021   BILITOT 0.5 10/31/2021   ALKPHOS 103 10/31/2021   AST 20 10/31/2021   ALT 17 10/31/2021   PROT 6.8 10/31/2021   ALBUMIN 4.9 10/31/2021   CALCIUM 9.6 10/31/2021   GFRAA 108 08/17/2019   QFTBGOLDPLUS NEGATIVE 02/16/2017    Speciality Comments: PLQ eye exam: 04/20/2019 normal. Dr. Burgess Estelle. Follow up in 1 year.  Procedures:  No procedures performed Allergies: Penicillins and Sulfa antibiotics   Assessment / Plan:     Visit Diagnoses: No diagnosis found.  Orders: No orders of the defined types were placed in this encounter.  No orders of the defined types were placed in this encounter.   Face-to-face time spent with patient was *** minutes. Greater than  50% of time was spent in counseling and coordination of care.  Follow-Up Instructions: No follow-ups on file.   Earnestine Mealing, CMA  Note - This record has been created using Editor, commissioning.  Chart creation errors have been sought, but may not always  have been located. Such creation errors do not reflect on  the standard of medical care.

## 2022-04-22 DIAGNOSIS — K2950 Unspecified chronic gastritis without bleeding: Secondary | ICD-10-CM | POA: Diagnosis not present

## 2022-04-24 ENCOUNTER — Other Ambulatory Visit: Payer: Self-pay | Admitting: Nurse Practitioner

## 2022-04-24 DIAGNOSIS — Z13820 Encounter for screening for osteoporosis: Secondary | ICD-10-CM | POA: Diagnosis not present

## 2022-04-24 DIAGNOSIS — F5104 Psychophysiologic insomnia: Secondary | ICD-10-CM

## 2022-04-28 ENCOUNTER — Ambulatory Visit (HOSPITAL_COMMUNITY)
Admission: RE | Admit: 2022-04-28 | Discharge: 2022-04-28 | Disposition: A | Discharge status: Home / Self Care | Payer: BC Managed Care – PPO | Source: Ambulatory Visit | Attending: Urology | Admitting: Urology

## 2022-04-28 DIAGNOSIS — Z87440 Personal history of urinary (tract) infections: Secondary | ICD-10-CM | POA: Insufficient documentation

## 2022-04-28 DIAGNOSIS — N390 Urinary tract infection, site not specified: Secondary | ICD-10-CM | POA: Diagnosis not present

## 2022-04-28 DIAGNOSIS — N1370 Vesicoureteral-reflux, unspecified: Secondary | ICD-10-CM | POA: Diagnosis not present

## 2022-04-28 DIAGNOSIS — N3020 Other chronic cystitis without hematuria: Secondary | ICD-10-CM

## 2022-04-28 DIAGNOSIS — Q625 Duplication of ureter: Secondary | ICD-10-CM

## 2022-04-28 MED ORDER — IOTHALAMATE MEGLUMINE 17.2 % UR SOLN
URETHRAL | Status: AC
  Filled 2022-04-28: qty 750

## 2022-04-28 MED ORDER — IOTHALAMATE MEGLUMINE 17.2 % UR SOLN
250.0000 mL | Freq: Once | URETHRAL | Status: DC | PRN
  Administered 2022-04-28: 400 mL via INTRAVESICAL

## 2022-04-29 ENCOUNTER — Other Ambulatory Visit: Payer: Self-pay | Admitting: Nurse Practitioner

## 2022-04-29 DIAGNOSIS — F5104 Psychophysiologic insomnia: Secondary | ICD-10-CM

## 2022-04-30 ENCOUNTER — Ambulatory Visit: Payer: BC Managed Care – PPO | Admitting: Physician Assistant

## 2022-04-30 DIAGNOSIS — K449 Diaphragmatic hernia without obstruction or gangrene: Secondary | ICD-10-CM

## 2022-04-30 DIAGNOSIS — Z87442 Personal history of urinary calculi: Secondary | ICD-10-CM

## 2022-04-30 DIAGNOSIS — K529 Noninfective gastroenteritis and colitis, unspecified: Secondary | ICD-10-CM

## 2022-04-30 DIAGNOSIS — M8589 Other specified disorders of bone density and structure, multiple sites: Secondary | ICD-10-CM

## 2022-04-30 DIAGNOSIS — G629 Polyneuropathy, unspecified: Secondary | ICD-10-CM

## 2022-04-30 DIAGNOSIS — E559 Vitamin D deficiency, unspecified: Secondary | ICD-10-CM

## 2022-04-30 DIAGNOSIS — F5101 Primary insomnia: Secondary | ICD-10-CM

## 2022-04-30 DIAGNOSIS — Z96652 Presence of left artificial knee joint: Secondary | ICD-10-CM

## 2022-04-30 DIAGNOSIS — M47816 Spondylosis without myelopathy or radiculopathy, lumbar region: Secondary | ICD-10-CM

## 2022-04-30 DIAGNOSIS — M160 Bilateral primary osteoarthritis of hip: Secondary | ICD-10-CM

## 2022-04-30 DIAGNOSIS — K760 Fatty (change of) liver, not elsewhere classified: Secondary | ICD-10-CM

## 2022-04-30 DIAGNOSIS — E790 Hyperuricemia without signs of inflammatory arthritis and tophaceous disease: Secondary | ICD-10-CM

## 2022-04-30 DIAGNOSIS — M1189 Other specified crystal arthropathies, multiple sites: Secondary | ICD-10-CM

## 2022-04-30 DIAGNOSIS — Z79899 Other long term (current) drug therapy: Secondary | ICD-10-CM

## 2022-04-30 DIAGNOSIS — Z96651 Presence of right artificial knee joint: Secondary | ICD-10-CM

## 2022-04-30 DIAGNOSIS — I10 Essential (primary) hypertension: Secondary | ICD-10-CM

## 2022-04-30 DIAGNOSIS — M19071 Primary osteoarthritis, right ankle and foot: Secondary | ICD-10-CM

## 2022-04-30 DIAGNOSIS — M19041 Primary osteoarthritis, right hand: Secondary | ICD-10-CM

## 2022-04-30 DIAGNOSIS — M1990 Unspecified osteoarthritis, unspecified site: Secondary | ICD-10-CM

## 2022-05-01 DIAGNOSIS — Z01419 Encounter for gynecological examination (general) (routine) without abnormal findings: Secondary | ICD-10-CM | POA: Diagnosis not present

## 2022-05-01 DIAGNOSIS — Z6832 Body mass index (BMI) 32.0-32.9, adult: Secondary | ICD-10-CM | POA: Diagnosis not present

## 2022-05-01 LAB — HM PAP SMEAR: HM Pap smear: NEGATIVE

## 2022-05-12 ENCOUNTER — Encounter: Payer: BC Managed Care – PPO | Admitting: Family Medicine

## 2022-05-29 NOTE — Progress Notes (Unsigned)
Office Visit Note  Patient: Marissa Larson             Date of Birth: May 09, 1955           MRN: [id number]             PCP: Sandre Kitty, MD Referring: Mayer Masker, PA-C Visit Date: 06/12/2022 Occupation: @GUAROCC @  Subjective:  Pain in multiple joints  History of Present Illness: Marissa Larson is a 67 y.o. female with history of inflammatory arthritis and osteoarthritis.  Patient presents today with increased joint pain involving multiple joints including both shoulders, both wrists, both hands, the left hip, left knee replacement, and both feet.  She has noticed intermittent inflammation in her wrist joints.  Patient had wrist joint injections performed in fall 2023 at Dr. Ronie Spies office which righted temporary relief.  Patient states that she had to discontinue meloxicam at the end of December 2023 due to developing ulcers found on endoscopy.  She states she has been taking turmeric for the anti-inflammatory properties.  She is been having to take Aleve at bedtime for symptomatic relief.  She takes Tylenol occasionally.  Her increased arthralgias and joint stiffness became severe starting about 3 weeks ago.  No identifiable trigger.  She has noticed increased pain and fatigue to the point she felt like she may be having flulike symptoms but has not had any other signs or symptoms of an infection.   Activities of Daily Living:  Patient reports morning stiffness for several hours.   Patient Reports nocturnal pain.  Difficulty dressing/grooming: Denies Difficulty climbing stairs: Reports Difficulty getting out of chair: Reports Difficulty using hands for taps, buttons, cutlery, and/or writing: Denies  Review of Systems  Constitutional:  Positive for fatigue.  HENT:  Negative for mouth sores and mouth dryness.   Eyes:  Negative for dryness.  Respiratory:  Negative for shortness of breath.   Cardiovascular:  Negative for chest pain and palpitations.  Gastrointestinal:   Negative for blood in stool, constipation and diarrhea.  Endocrine: Negative for increased urination.  Genitourinary:  Negative for involuntary urination.  Musculoskeletal:  Positive for joint pain, gait problem, joint pain, muscle weakness and morning stiffness. Negative for joint swelling, myalgias, muscle tenderness and myalgias.  Skin:  Negative for color change, rash, hair loss and sensitivity to sunlight.  Allergic/Immunologic: Negative for susceptible to infections.  Neurological:  Negative for dizziness and headaches.  Hematological:  Positive for swollen glands.  Psychiatric/Behavioral:  Positive for sleep disturbance. Negative for depressed mood. The patient is not nervous/anxious.     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   Obesity, Class I, BMI 30-34.9 12/03/2016   Kidney stones 09/12/2016   Nausea 09/12/2016   High risk medication use 07/30/2016   NAFLD (nonalcoholic fatty liver disease) 62/13/0865   Primary osteoarthritis of both feet 04/12/2016   History of kidney stones 04/12/2016   Primary osteoarthritis of both hands 04/11/2016   Bilateral primary osteoarthritis of hip  04/11/2016   Primary osteoarthritis of both knees 04/11/2016   Spondylosis of lumbar region without myelopathy or radiculopathy 04/11/2016   Hyperuricemia 04/11/2016   Primary insomnia 04/11/2016   Flank pain 04/02/2016   Sinusitis 04/02/2016    Elevated HDL 11/11/2015   Elevated LFTs 11/11/2015   Adjustment disorder with mixed anxiety and depressed mood 11/11/2015   Dysuria 11/11/2015   Hypothyroidism 10/14/2015   Pseudogout involving multiple joints 10/14/2015   Vitamin D deficiency 10/14/2015   Hormone replacement therapy- per GYN 10/14/2015   HTN (hypertension) 10/14/2015   Fatigue 10/14/2015   h/o Hiatal hernia 10/14/2015   Sleep difficulties 10/14/2015   Generalized OA 10/14/2015   Counseling on health promotion and disease prevention 10/14/2015   Gastric ulcer- due to Mobic 10/09/2015   Age-related nuclear cataract of both eyes 01/25/2015   Posterior vitreous detachment of left eye 01/25/2015   chronic Palpitations 07/19/2013    Past Medical History:  Diagnosis Date   Arthritis    Chest pain 06/2013   Left sided   Chronic leg pain 06/2013   Complication of anesthesia    Dysrhythmia    Fatigue    GERD (gastroesophageal reflux disease)    History of kidney stones    Hypertension    Hypothyroidism    Nonproductive cough 06/2013   Overweight 01/18/2014   Peripheral neuropathy    PONV (postoperative nausea and vomiting)    Sleep difficulties    Change in sleep patterns   SOB (shortness of breath) 06/2013   Tachycardia 06/2013   Thyroid disease    hypo   UTI (lower urinary tract infection)    Vesico-ureteric reflux     Family History  Problem Relation Age of Onset   Migraines Other        Fam Hx    Heart disease Other        Fam Hx   Thyroid disease Other        Fam Hx - She also has thyroid disease   Diabetes Other        Fam Hx   Arthritis Other        Fam Hx   Gallbladder disease Other        Fam Hx   Cancer Other        Fam Hx - Pancreatic   Hypertension Other        Fam Hx   Heart attack Cousin        Multiple cousins with MI's before 69 years of age   Heart disease Mother 18   Hyperlipidemia Mother 88   Hypertension Mother 22   Stroke Mother 74   Heart disease Maternal Grandmother     Cancer Father 71       pancreatic   Diabetes Brother 50   Heart attack Maternal Uncle    Heart attack Paternal Aunt    Past Surgical History:  Procedure Laterality Date   ABDOMINAL SURGERY  1994   CYSTOSCOPY/URETEROSCOPY/HOLMIUM LASER/STENT PLACEMENT Left 09/22/2017   Procedure: CYSTOSCOPY/RETROGRADE/URETEROSCOPY/ BASKET STONE EXTRACTION/STENT PLACEMENT;  Surgeon: Rene Paci, MD;  Location: [facility];  Service: Urology;  Laterality: Left;  ONLY NEEDS 60 MIN   FOOT SURGERY Right 2009   KIDNEY SURGERY Left 1993   KNEE ARTHROPLASTY Right 11/02/2019   KNEE SURGERY     Multiple knee surgeries   OOPHORECTOMY Left 1994   REPLACEMENT TOTAL KNEE Left 10/13/2018   Dr. Thurston Hole   small fiber neuropathy biopsy  12/2017   TONSILLECTOMY  URETEROSCOPY VIA URETEROSTOMY Left 1994   Social History   Social History Narrative   Not on file   Immunization History  Administered Date(s) Administered   Tdap 01/07/2011     Objective: Vital Signs: BP (!) 146/62 (BP Location: Left Arm, Patient Position: Sitting, Cuff Size: Normal)   Pulse 69   Resp 16   Ht 5\' 5"  (1.651 m)   Wt 179 lb 9.6 oz (81.5 kg)   BMI 29.89 kg/m    Physical Exam Vitals and nursing note reviewed.  Constitutional:      Appearance: She is well-developed.  HENT:     Head: Normocephalic and atraumatic.  Eyes:     Conjunctiva/sclera: Conjunctivae normal.  Cardiovascular:     Rate and Rhythm: Normal rate and regular rhythm.     Heart sounds: Normal heart sounds.  Pulmonary:     Effort: Pulmonary effort is normal.     Breath sounds: Normal breath sounds.  Abdominal:     General: Bowel sounds are normal.     Palpations: Abdomen is soft.  Musculoskeletal:     Cervical back: Normal range of motion.  Lymphadenopathy:     Cervical: No cervical adenopathy.  Skin:    General: Skin is warm and dry.     Capillary Refill: Capillary refill takes less than 2 seconds.  Neurological:     Mental Status: She is alert  and oriented to person, place, and time.  Psychiatric:        Behavior: Behavior normal.      Musculoskeletal Exam: C-spine has limited range of motion with lateral rotation especially to the left.  Postural thoracic kyphosis.  Painful range of motion of both shoulder joints.  Elbow joints have good range of motion.  Tenderness and inflammation over the ulnar aspect of both wrist.  Limited extension of both wrist joints.  PIP and DIP thickening consistent with osteoarthritis of both hands.  Tenderness over bilateral second and third MCP joints.  Limited range of motion of both hip joints.  Painful range of motion of the left hip joint.  Left knee replacement has good range of motion with no warmth or effusion.  Tenderness and warmth of both ankle joints.  No tenderness or synovitis over MTP joints currently.  PIP and DIP thickening consistent with osteoarthritis of both feet.   CDAI Exam: CDAI Score: -- Patient Global: --; Provider Global: -- Swollen: --; Tender: -- Joint Exam 06/12/2022   No joint exam has been documented for this visit   There is currently no information documented on the homunculus. Go to the Rheumatology activity and complete the homunculus joint exam.  Investigation: No additional findings.  Imaging: No results found.  Recent Labs: Lab Results  Component Value Date   WBC 7.6 10/31/2021   HGB 14.3 10/31/2021   PLT 252 10/31/2021   NA 141 10/31/2021   K 4.1 10/31/2021   CL 103 10/31/2021   CO2 24 10/31/2021   GLUCOSE 104 (H) 10/31/2021   BUN 15 10/31/2021   CREATININE 0.52 (L) 10/31/2021   BILITOT 0.5 10/31/2021   ALKPHOS 103 10/31/2021   AST 20 10/31/2021   ALT 17 10/31/2021   PROT 6.8 10/31/2021   ALBUMIN 4.9 10/31/2021   CALCIUM 9.6 10/31/2021   GFRAA 108 08/17/2019   QFTBGOLDPLUS NEGATIVE 02/16/2017    Speciality Comments: PLQ eye exam: 04/20/2019 normal. Dr. Burgess Estelle. Follow up in 1 year.  Procedures:  No procedures performed Allergies:  Penicillins and Sulfa antibiotics   Assessment /  Plan:     Visit Diagnoses: Inflammatory arthritis -Patient presents today with increased pain, stiffness, and inflammation involving multiple joints.  She started to have a flare 3 weeks ago at which time she is having generalized pain and fatigue.  She has noticed increased inflammation in both wrist joints but has also had increased pain and stiffness in both shoulders, left hip, left knee replacement, and both feet.  She has been taking Aleve at bedtime for pain relief and occasionally Tylenol.  She discontinued meloxicam in December 2023 due to several GI ulcers.  In the past she had an intolerance to Celebrex due to GI side effects.  She had to discontinue the use of colchicine in the past due to developing paresthesias in her hands and feet.  She previously was on Plaquenil but discontinued due to possible inadequate response.  Different treatment options were discussed today.  Discussed that her treatment options are limited.  Plan to try reinitiating Plaquenil 200 mg 1 tablet by mouth twice daily Monday through Friday.  Indications, contraindications, potential side effects of Plaquenil were reviewed again today.  All questions were addressed and consent was updated.  CBC, CMP, sed rate, and CRP were updated today.  A prescription for Plaquenil be sent to the pharmacy pending lab results.  In the meantime a prednisone taper was sent to the pharmacy to alleviate her current symptoms.  She will take prednisone 20 mg tapering by 5 mg every 4 days.  Patient was advised to take prednisone in the morning with food and avoid the use of NSAIDs.  She will follow-up in the office in 2 months to reassess her response.  Plan: Sedimentation rate, C-reactive protein  Patient was counseled on the purpose, proper use, and adverse effects of hydroxychloroquine including nausea/diarrhea, skin rash, headaches, and sun sensitivity.  Advised patient to wear sunscreen once  starting hydroxychloroquine to reduce risk of rash associated with sun sensitivity.  Discussed importance of annual eye exams while on hydroxychloroquine to monitor to ocular toxicity and discussed importance of frequent laboratory monitoring.  Provided patient with eye exam form for baseline ophthalmologic exam.  Reviewed risk for QTC prolongation when used in combination with other QTc prolonging agents (including but not limited to antiarrhythmics, macrolide antibiotics, flouroquinolones, tricyclic antidepressants, citalopram, specific antipsychotics, ondansetron, migraine triptans, and methadone). Provided patient with educational materials on hydroxychloroquine and answered all questions.  Patient consented to hydroxychloroquine. Will upload consent in the media tab.    Dose will be Plaquenil 200 mg twice daily Monday through Friday.  Prescription pending lab results.  Medication management - Plan to reinitiate Plaquenil 200 mg 1 tablet by mouth twice daily Monday through Friday pending lab results.  Previous therapy: Celebrex-GI side effects, meloxicam-ulcers, colchicine-paresthesias/neuropathy, Plaquenil-inadequate response. CBC and CMP were updated today.  She will require updated lab work in 1 month and 3 months then every 5 months.  Patient was advised to schedule a baseline Plaquenil eye examination. - Plan: CBC with Differential/Platelet, COMPLETE METABOLIC PANEL WITH GFR, CBC with Differential/Platelet, COMPLETE METABOLIC PANEL WITH GFR  Pseudogout involving multiple joints - Discontinued colchicine in October 2021.  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  Currently has tenderness and inflammation in both wrist joints.  Plan on reinitiating plaquenil as discussed above.  A prednisone taper sent to the pharmacy as discussed above.  Primary osteoarthritis of both hands: PIP and DIP thickening consistent with OA of both hands.  X-rays of both hands from 03/02/2019 were consistent with osteoarthritis  and chondrocalcinosis. Inflammation  noted in both wrists.  A prednisone taper was sent to the pharmacy today.   Bilateral primary osteoarthritis of hip: Painful and limited ROM both hips, Left > right.  X-rays of the left hip updated today.   S/P total knee arthroplasty, right - Dr. Thurston Hole. Doing well. Good ROM with no discomfort.   S/P total knee replacement, left - surgery by Dr. Thurston Hole in 2020. She has been having increased discomfort in the right knee replacement.   Pain in left hip -She presents today with increased pain and stiffness in the left hip joint.  No recent fall or injury.  X-rays of the left hip were updated today. Plan: XR HIP UNILAT W OR W/O PELVIS 2-3 VIEWS LEFT  Primary osteoarthritis of both feet: X-rays of both feet were consistent with osteoarthritis on 03/02/2019.  Patient presents today with acute on chronic pain in both feet.  Warmth and tenderness of both ankles.  PIP and DIP thickening consistent with OA of both feet.    Spondylosis of lumbar region without myelopathy or radiculopathy: Limited mobility.  Intermittent pain and stiffness.   Hyperuricemia: No signs or symptoms of a gout flare.   Osteopenia of multiple sites - April 2022 T score was -2.3 in the lumbar spine. Due to [facility]  Other medical conditions are listed as follows:   Small fiber neuropathy  NAFLD (nonalcoholic fatty liver disease)  History of kidney stones  Vitamin D deficiency  Essential hypertension: Blood pressure is 146/62 today in the office.  Her blood pressure was rechecked prior to leaving.  She was advised to monitor blood pressures closely.  Primary insomnia  h/o Hiatal hernia    Orders: Orders Placed This Encounter  Procedures   XR HIP UNILAT W OR W/O PELVIS 2-3 VIEWS LEFT   CBC with Differential/Platelet   COMPLETE METABOLIC PANEL WITH GFR   Sedimentation rate   C-reactive protein   CBC with Differential/Platelet   COMPLETE METABOLIC  PANEL WITH GFR   Meds ordered this encounter  Medications   predniSONE (DELTASONE) 5 MG tablet    Sig: Take 4 tablets by mouth daily x 4 days, 3 tablets daily x 4 days, 2 tablets daily x 4 days, 1 tablet daily x 4 days.    Dispense:  40 tablet    Refill:  0      Follow-Up Instructions: Return in about 2 months (around 08/12/2022) for Inflammatory arthritis.   Gearldine Bienenstock, PA-C  Note - This record has been created using Dragon software.  Chart creation errors have been sought, but may not always  have been located. Such creation errors do not reflect on  the standard of medical care.

## 2022-06-06 ENCOUNTER — Other Ambulatory Visit: Payer: Self-pay | Admitting: Family Medicine

## 2022-06-06 DIAGNOSIS — F4323 Adjustment disorder with mixed anxiety and depressed mood: Secondary | ICD-10-CM

## 2022-06-12 ENCOUNTER — Encounter: Payer: Self-pay | Admitting: Physician Assistant

## 2022-06-12 ENCOUNTER — Ambulatory Visit: Payer: Medicare HMO | Attending: Rheumatology | Admitting: Physician Assistant

## 2022-06-12 ENCOUNTER — Ambulatory Visit (INDEPENDENT_AMBULATORY_CARE_PROVIDER_SITE_OTHER): Payer: Medicare HMO

## 2022-06-12 VITALS — BP 146/62 | HR 69 | Resp 16 | Ht 65.0 in | Wt 179.6 lb

## 2022-06-12 DIAGNOSIS — M1990 Unspecified osteoarthritis, unspecified site: Secondary | ICD-10-CM | POA: Diagnosis not present

## 2022-06-12 DIAGNOSIS — M19041 Primary osteoarthritis, right hand: Secondary | ICD-10-CM | POA: Diagnosis not present

## 2022-06-12 DIAGNOSIS — Z96651 Presence of right artificial knee joint: Secondary | ICD-10-CM | POA: Diagnosis not present

## 2022-06-12 DIAGNOSIS — E559 Vitamin D deficiency, unspecified: Secondary | ICD-10-CM

## 2022-06-12 DIAGNOSIS — M19072 Primary osteoarthritis, left ankle and foot: Secondary | ICD-10-CM

## 2022-06-12 DIAGNOSIS — F5101 Primary insomnia: Secondary | ICD-10-CM

## 2022-06-12 DIAGNOSIS — Z79899 Other long term (current) drug therapy: Secondary | ICD-10-CM

## 2022-06-12 DIAGNOSIS — M160 Bilateral primary osteoarthritis of hip: Secondary | ICD-10-CM | POA: Diagnosis not present

## 2022-06-12 DIAGNOSIS — K760 Fatty (change of) liver, not elsewhere classified: Secondary | ICD-10-CM

## 2022-06-12 DIAGNOSIS — Z87442 Personal history of urinary calculi: Secondary | ICD-10-CM

## 2022-06-12 DIAGNOSIS — M1189 Other specified crystal arthropathies, multiple sites: Secondary | ICD-10-CM

## 2022-06-12 DIAGNOSIS — M1380 Other specified arthritis, unspecified site: Secondary | ICD-10-CM

## 2022-06-12 DIAGNOSIS — I10 Essential (primary) hypertension: Secondary | ICD-10-CM

## 2022-06-12 DIAGNOSIS — M19042 Primary osteoarthritis, left hand: Secondary | ICD-10-CM

## 2022-06-12 DIAGNOSIS — T39395A Adverse effect of other nonsteroidal anti-inflammatory drugs [NSAID], initial encounter: Secondary | ICD-10-CM | POA: Diagnosis not present

## 2022-06-12 DIAGNOSIS — M25552 Pain in left hip: Secondary | ICD-10-CM

## 2022-06-12 DIAGNOSIS — E790 Hyperuricemia without signs of inflammatory arthritis and tophaceous disease: Secondary | ICD-10-CM | POA: Diagnosis not present

## 2022-06-12 DIAGNOSIS — M47816 Spondylosis without myelopathy or radiculopathy, lumbar region: Secondary | ICD-10-CM | POA: Diagnosis not present

## 2022-06-12 DIAGNOSIS — Z96652 Presence of left artificial knee joint: Secondary | ICD-10-CM | POA: Diagnosis not present

## 2022-06-12 DIAGNOSIS — M8589 Other specified disorders of bone density and structure, multiple sites: Secondary | ICD-10-CM

## 2022-06-12 DIAGNOSIS — G629 Polyneuropathy, unspecified: Secondary | ICD-10-CM | POA: Diagnosis not present

## 2022-06-12 DIAGNOSIS — K259 Gastric ulcer, unspecified as acute or chronic, without hemorrhage or perforation: Secondary | ICD-10-CM | POA: Diagnosis not present

## 2022-06-12 DIAGNOSIS — M19071 Primary osteoarthritis, right ankle and foot: Secondary | ICD-10-CM | POA: Diagnosis not present

## 2022-06-12 DIAGNOSIS — K449 Diaphragmatic hernia without obstruction or gangrene: Secondary | ICD-10-CM

## 2022-06-12 LAB — CBC WITH DIFFERENTIAL/PLATELET
HCT: 42.7 % (ref 35.0–45.0)
Hemoglobin: 14.5 g/dL (ref 11.7–15.5)
RDW: 12.3 % (ref 11.0–15.0)
WBC: 8.4 10*3/uL (ref 3.8–10.8)

## 2022-06-12 MED ORDER — PREDNISONE 5 MG PO TABS
ORAL_TABLET | ORAL | 0 refills | Status: DC
Start: 2022-06-12 — End: 2022-08-19

## 2022-06-12 NOTE — Patient Instructions (Signed)
Standing Labs We placed an order today for your standing lab work.   Please have your standing labs drawn in 1 month, 3 months, and every 5 months   Please have your labs drawn 2 weeks prior to your appointment so that the provider can discuss your lab results at your appointment, if possible.  Please note that you may see your imaging and lab results in MyChart before we have reviewed them. We will contact you once all results are reviewed. Please allow our office up to 72 hours to thoroughly review all of the results before contacting the office for clarification of your results.  WALK-IN LAB HOURS  Monday through Thursday from 8:00 am -12:30 pm and 1:00 pm-5:00 pm and Friday from 8:00 am-12:00 pm.  Patients with office visits requiring labs will be seen before walk-in labs.  You may encounter longer than normal wait times. Please allow additional time. Wait times may be shorter on  Monday and Thursday afternoons.  We do not book appointments for walk-in labs. We appreciate your patience and understanding with our staff.   Labs are drawn by [facility]. Please bring your co-pay at the time of your lab draw.  You may receive a bill from [facility] for your lab work.  Please note if you are on Hydroxychloroquine and and an order has been placed for a Hydroxychloroquine level,  you will need to have it drawn 4 hours or more after your last dose.  If you wish to have your labs drawn at another location, please call the office 24 hours in advance so we can fax the orders.  The office is located at [address], [place], [place] [address]   If you have any questions regarding directions or hours of operation,  please call [phone].   As a reminder, please drink plenty of water prior to coming for your lab work. Thanks!   Hydroxychloroquine Tablets What is this medication? HYDROXYCHLOROQUINE (hye drox ee KLOR oh kwin) treats autoimmune conditions, such as rheumatoid arthritis and  lupus. It works by slowing down an overactive immune system. It may also be used to prevent and treat malaria. It works by killing the parasite that causes malaria. It belongs to a group of medications called DMARDs. This medicine may be used for other purposes; ask your health care provider or pharmacist if you have questions. COMMON BRAND NAME(S): Plaquenil, Quineprox What should I tell my care team before I take this medication? They need to know if you have any of these conditions: Diabetes Eye disease, vision problems Frequently drink alcohol G6PD deficiency Heart disease Irregular heartbeat or rhythm Kidney disease Liver disease Porphyria Psoriasis An unusual or allergic reaction to hydroxychloroquine, other medications, foods, dyes, or preservatives Pregnant or trying to get pregnant Breastfeeding How should I use this medication? Take this medication by mouth with water. Take it as directed on the prescription label. Do not cut, crush, or chew this medication. Swallow the tablets whole. Take it with food. Do not take it more than directed. Take all of this medication unless your care team tells you to stop it early. Keep taking it even if you think you are better. Take products with antacids in them at a different time of day than this medication. Take this medication 4 hours before or 4 hours after antacids. Talk to your care team if you have questions. Talk to your care team about the use of this medication in children. While this medication may be prescribed  for selected conditions, precautions do apply. Overdosage: If you think you have taken too much of this medicine contact a poison control center or emergency room at once. NOTE: This medicine is only for you. Do not share this medicine with others. What if I miss a dose? If you miss a dose, take it as soon as you can. If it is almost time for your next dose, take only that dose. Do not take double or extra doses. What may  interact with this medication? Do not take this medication with any of the following: Cisapride Dronedarone Pimozide Thioridazine This medication may also interact with the following: Ampicillin Antacids Cimetidine Cyclosporine Digoxin Kaolin Medications for diabetes, such as insulin, glipizide, glyburide Medications for seizures, such as carbamazepine, phenobarbital, phenytoin Mefloquine Methotrexate Other medications that cause heart rhythm changes Praziquantel This list may not describe all possible interactions. Give your health care provider a list of all the medicines, herbs, non-prescription drugs, or dietary supplements you use. Also tell them if you smoke, drink alcohol, or use illegal drugs. Some items may interact with your medicine. What should I watch for while using this medication? Visit your care team for regular checks on your progress. Tell your care team if your symptoms do not start to get better or if they get worse. You may need blood work done while you are taking this medication. If you take other medications that can affect heart rhythm, you may need more testing. Talk to your care team if you have questions. Your vision may be tested before and during use of this medication. Tell your care team right away if you have any change in your eyesight. This medication may cause serious skin reactions. They can happen weeks to months after starting the medication. Contact your care team right away if you notice fevers or flu-like symptoms with a rash. The rash may be red or purple and then turn into blisters or peeling of the skin. Or, you might notice a red rash with swelling of the face, lips or lymph nodes in your neck or under your arms. If you or your family notice any changes in your behavior, such as new or worsening depression, thoughts of harming yourself, anxiety, or other unusual or disturbing thoughts, or memory loss, call your care team right away. What side  effects may I notice from receiving this medication? Side effects that you should report to your care team as soon as possible: Allergic reactions--skin rash, itching, hives, swelling of the face, lips, tongue, or throat Aplastic anemia--unusual weakness or fatigue, dizziness, headache, trouble breathing, increased bleeding or bruising Change in vision Heart rhythm changes--fast or irregular heartbeat, dizziness, feeling faint or lightheaded, chest pain, trouble breathing Infection--fever, chills, cough, or sore throat Low blood sugar (hypoglycemia)--tremors or shaking, anxiety, sweating, cold or clammy skin, confusion, dizziness, rapid heartbeat Muscle injury--unusual weakness or fatigue, muscle pain, dark yellow or brown urine, decrease in amount of urine Pain, tingling, or numbness in the hands or feet Rash, fever, and swollen lymph nodes Redness, blistering, peeling, or loosening of the skin, including inside the mouth Thoughts of suicide or self-harm, worsening mood, or feelings of depression Unusual bruising or bleeding Side effects that usually do not require medical attention (report to your care team if they continue or are bothersome): Diarrhea Headache Nausea Stomach pain Vomiting This list may not describe all possible side effects. Call your doctor for medical advice about side effects. You may report side effects to FDA at 1-800-FDA-1088. Where  should I keep my medication? Keep out of the reach of children and pets. Store at room temperature up to 30 degrees C (86 degrees F). Protect from light. Get rid of any unused medication after the expiration date. To get rid of medications that are no longer needed or have expired: Take the medication to a medication take-back program. Check with your pharmacy or law enforcement to find a location. If you cannot return the medication, check the label or package insert to see if the medication should be thrown out in the garbage or  flushed down the toilet. If you are not sure, ask your care team. If it is safe to put it in the trash, empty the medication out of the container. Mix the medication with cat litter, dirt, coffee grounds, or other unwanted substance. Seal the mixture in a bag or container. Put it in the trash. NOTE: This sheet is a summary. It may not cover all possible information. If you have questions about this medicine, talk to your doctor, pharmacist, or health care provider.  2024 Elsevier/Gold Standard (2021-07-01 00:00:00)

## 2022-06-12 NOTE — Progress Notes (Signed)
ESR WNL

## 2022-06-12 NOTE — Progress Notes (Signed)
CBC WNL

## 2022-06-12 NOTE — Progress Notes (Signed)
Pharmacy Note  Subjective: Patient presents today to [facility] for follow up office visit.   Patient seen by the pharmacist for counseling on hydroxychloroquine inflammatory arthritis.  Prior therapy includes: Plaquenil (inadequate response)  Objective: CMP     Component Value Date/Time   NA 141 10/31/2021 1001   K 4.1 10/31/2021 1001   CL 103 10/31/2021 1001   CO2 24 10/31/2021 1001   GLUCOSE 104 (H) 10/31/2021 1001   GLUCOSE 100 (H) 10/13/2019 1128   BUN 15 10/31/2021 1001   CREATININE 0.52 (L) 10/31/2021 1001   CREATININE 0.54 07/26/2019 0927   CALCIUM 9.6 10/31/2021 1001   PROT 6.8 10/31/2021 1001   ALBUMIN 4.9 10/31/2021 1001   ALBUMIN 4.3 05/04/2015 0000   AST 20 10/31/2021 1001   ALT 17 10/31/2021 1001   ALKPHOS 103 10/31/2021 1001   BILITOT 0.5 10/31/2021 1001   GFRNONAA >60 10/13/2019 1128   GFRNONAA 101 07/26/2019 0927   GFRAA 108 08/17/2019 0908   GFRAA 117 07/26/2019 0927    CBC    Component Value Date/Time   WBC 7.6 10/31/2021 1001   WBC 9.4 10/08/2021 1355   RBC 4.43 10/31/2021 1001   RBC 4.60 10/08/2021 1355   HGB 14.3 10/31/2021 1001   HCT 41.0 10/31/2021 1001   PLT 252 10/31/2021 1001   MCV 93 10/31/2021 1001   MCH 32.3 10/31/2021 1001   MCH 32.6 10/08/2021 1355   MCHC 34.9 10/31/2021 1001   MCHC 34.4 10/08/2021 1355   RDW 12.1 10/31/2021 1001   LYMPHSABS 2.1 10/31/2021 1001   MONOABS 456 07/18/2016 1007   EOSABS 0.1 10/31/2021 1001   BASOSABS 0.1 10/31/2021 1001    Assessment/Plan: Patient was counseled on the purpose, proper use, and adverse effects of hydroxychloroquine including nausea/diarrhea, skin rash, headaches, and sun sensitivity.  Advised patient to wear sunscreen once starting hydroxychloroquine to reduce risk of rash associated with sun sensitivity.  Discussed importance of annual eye exams while on hydroxychloroquine to monitor to ocular toxicity and discussed importance of frequent laboratory monitoring.  Provided  patient with eye exam form for baseline ophthalmologic exam.  Reviewed risk for QTC prolongation when used in combination with other QTc prolonging agents (including but not limited to antiarrhythmics, macrolide antibiotics, flouroquinolones, tricyclic antidepressants, citalopram, specific antipsychotics, ondansetron, migraine triptans, and methadone). Provided patient with educational materials on hydroxychloroquine and answered all questions.  Patient consented to hydroxychloroquine. Will upload consent in the media tab.    Dose will be Plaquenil 200 mg twice daily Monday through Friday.  Prescription pending lab results.

## 2022-06-12 NOTE — Progress Notes (Signed)
X-ray consistent with severe osteoarthritis of both hips.  Please notify the patient.  Please ask if she would like a referral to orthopedics to discuss treatment options

## 2022-06-13 LAB — COMPLETE METABOLIC PANEL WITH GFR
AG Ratio: 2 (calc) (ref 1.0–2.5)
ALT: 14 U/L (ref 6–29)
AST: 18 U/L (ref 10–35)
Albumin: 4.6 g/dL (ref 3.6–5.1)
Alkaline phosphatase (APISO): 78 U/L (ref 37–153)
BUN: 20 mg/dL (ref 7–25)
CO2: 28 mmol/L (ref 20–32)
Calcium: 9.6 mg/dL (ref 8.6–10.4)
Chloride: 104 mmol/L (ref 98–110)
Creat: 0.68 mg/dL (ref 0.50–1.05)
Globulin: 2.3 g/dL (calc) (ref 1.9–3.7)
Glucose, Bld: 104 mg/dL — ABNORMAL HIGH (ref 65–99)
Potassium: 4.3 mmol/L (ref 3.5–5.3)
Sodium: 142 mmol/L (ref 135–146)
Total Bilirubin: 0.4 mg/dL (ref 0.2–1.2)
Total Protein: 6.9 g/dL (ref 6.1–8.1)
eGFR: 97 mL/min/{1.73_m2} (ref >=60)

## 2022-06-13 LAB — CBC WITH DIFFERENTIAL/PLATELET
Absolute Monocytes: 571 cells/uL (ref 200–950)
Basophils Absolute: 84 cells/uL (ref 0–200)
Basophils Relative: 1 %
Eosinophils Absolute: 92 cells/uL (ref 15–500)
Eosinophils Relative: 1.1 %
Lymphs Abs: 2579 cells/uL (ref 850–3900)
MCH: 32 pg (ref 27.0–33.0)
MCHC: 34 g/dL (ref 32.0–36.0)
MCV: 94.3 fL (ref 80.0–100.0)
MPV: 10.4 fL (ref 7.5–12.5)
Monocytes Relative: 6.8 %
Neutro Abs: 5074 cells/uL (ref 1500–7800)
Neutrophils Relative %: 60.4 %
Platelets: 247 10*3/uL (ref 140–400)
RBC: 4.53 10*6/uL (ref 3.80–5.10)
Total Lymphocyte: 30.7 %

## 2022-06-13 LAB — SEDIMENTATION RATE: Sed Rate: 2 mm/h (ref 0–30)

## 2022-06-13 LAB — C-REACTIVE PROTEIN: CRP: 4.5 mg/L (ref <8.0)

## 2022-06-13 NOTE — Progress Notes (Signed)
Glucose 104. Rest of CMP WNL CRP WNL

## 2022-06-16 ENCOUNTER — Other Ambulatory Visit: Payer: Self-pay

## 2022-06-16 MED ORDER — HYDROXYCHLOROQUINE SULFATE 200 MG PO TABS: ORAL_TABLET | ORAL | 0 refills | Status: DC

## 2022-06-16 NOTE — Telephone Encounter (Signed)
Contacted the patient to advise prescription was sent it. Patient verbalized understanding.

## 2022-06-16 NOTE — Telephone Encounter (Signed)
Patient contacted the office and left a message inquiring about lab results and whether a prescription of plaquenil was sent in for her. Contacted the patient and advised her of lab results and patient states she would like prescription of plaquenil sent to [facility] in [place].  After reviewing the chart, office note on 06/12/2022 mentions Plan to try reinitiating Plaquenil 200 mg 1 tablet by mouth twice daily Monday through Friday. Patient inquires if she is good to restart plaquenil and if so a new prescription needs to be sent in. Patient call back number is [phone]. Please advise. Please review and sign if needed.

## 2022-06-24 ENCOUNTER — Ambulatory Visit (INDEPENDENT_AMBULATORY_CARE_PROVIDER_SITE_OTHER): Payer: Medicare HMO | Admitting: Family Medicine

## 2022-06-24 ENCOUNTER — Encounter: Payer: Self-pay | Admitting: Family Medicine

## 2022-06-24 VITALS — BP 128/85 | HR 68 | Ht 65.0 in | Wt 177.1 lb

## 2022-06-24 DIAGNOSIS — E039 Hypothyroidism, unspecified: Secondary | ICD-10-CM

## 2022-06-24 DIAGNOSIS — F5104 Psychophysiologic insomnia: Secondary | ICD-10-CM | POA: Diagnosis not present

## 2022-06-24 DIAGNOSIS — E7800 Pure hypercholesterolemia, unspecified: Secondary | ICD-10-CM | POA: Diagnosis not present

## 2022-06-24 DIAGNOSIS — R7303 Prediabetes: Secondary | ICD-10-CM | POA: Diagnosis not present

## 2022-06-24 DIAGNOSIS — E785 Hyperlipidemia, unspecified: Secondary | ICD-10-CM

## 2022-06-24 DIAGNOSIS — M1990 Unspecified osteoarthritis, unspecified site: Secondary | ICD-10-CM

## 2022-06-24 NOTE — Assessment & Plan Note (Signed)
-   Future order placed for TSH.  Patient wanted this prior to her next visit and had to her endocrinologist. - Synthroid currently prescribed by endocrinologist.

## 2022-06-24 NOTE — Progress Notes (Signed)
   Established Patient Office Visit  Subjective   Patient ID: Marissa Larson, female    DOB: 1955-08-02  Age: 67 y.o. MRN: [id number]  Chief Complaint  Patient presents with   Annual Exam    HPI  Htn -not taking medications for blood pressure.  Managing it through lifestyle modifications.  Insomnia -patient is taking the trazodone.  Feels like this is working well for her.  No concerns  Anxiety -patient taking Lexapro.  Feels like it is working well.  No concerns.  Does not need refills at this time.  Inflammatory arthritis -patient was on meloxicam for a long time for arthritis, stopped this due to developing gastrointestinal ulcers.  Was then placed on prednisone 5 mg with rheumatology.  Now transitioning from prednisone to Plaquenil.  Hypothyroid-patient gets her Synthroid through her endocrinologist, Dr. Kriste Larson.  Discussed her thyroid level from 7 months ago.  Patient would like to have her thyroid checked again prior to her next appointment with the endocrinologist.  Patient has had a mammogram within the past 2 years and had a colonoscopy within the past year.  Patient had a DEXA scan 2 months ago.  Has had Pap smear within the past 2 years.  [facility], sees Dr. Vincente Larson    ROS    Objective:     BP 128/85   Pulse 68   Ht 5\' 5"  (1.651 m)   Wt 177 lb 1.9 oz (80.3 kg)   SpO2 96%   BMI 29.47 kg/m    Physical Exam General: Alert, oriented.  No acute distress CV: Regular rhythm Pulmonary: Lungs are bilaterally GI: Soft, nontender  No results found for any visits on 06/24/22.    The 10-year ASCVD risk score (Arnett DK, et al., 2019) is: 4.9%    Assessment & Plan:   Problem List Items Addressed This Visit       Endocrine   Hypothyroidism - Primary (Chronic)    - Future order placed for TSH.  Patient wanted this prior to her next visit and had to her endocrinologist. - Synthroid currently prescribed by endocrinologist.      Relevant  Orders   TSH     Musculoskeletal and Integument   Inflammatory arthritis    Was taking meloxicam.  Stopped due to stomach ulcers.  Started on 5 mg prednisone.  Transitioning of prednisone to Plaquenil.  Managed by rheumatology.        Other   Chronic insomnia    - Continue trazodone as needed      Elevated LDL cholesterol level    Lipid panel ordered      Prediabetes   Relevant Orders   HgB A1c   Other Visit Diagnoses     Hyperlipidemia, unspecified hyperlipidemia type       Relevant Orders   Lipid Profile       No follow-ups on file.    Sandre Kitty, MD

## 2022-06-24 NOTE — Patient Instructions (Signed)
It was nice to meet you today,  We did not make any medication changes.  We will get your lab test at your earliest convenience.  Just make an appointment at front.  I would like to see you back in 6 months.  Have a great day,  Frederic Jericho, MD

## 2022-06-24 NOTE — Assessment & Plan Note (Signed)
Continue trazodone as needed. 

## 2022-06-24 NOTE — Assessment & Plan Note (Signed)
Was taking meloxicam.  Stopped due to stomach ulcers.  Started on 5 mg prednisone.  Transitioning of prednisone to Plaquenil.  Managed by rheumatology.

## 2022-06-24 NOTE — Assessment & Plan Note (Signed)
Lipid panel ordered.

## 2022-06-24 NOTE — Telephone Encounter (Signed)
Attempted to call the patient to discuss.   Please clarify what dose of prednisone she took yesterday?  If she took 5 mg--ok to continue 1 tablet of 5 mg until the taper is complete.

## 2022-06-26 ENCOUNTER — Encounter: Payer: Self-pay | Admitting: Family Medicine

## 2022-06-30 ENCOUNTER — Ambulatory Visit: Payer: BC Managed Care – PPO | Admitting: Student

## 2022-07-01 ENCOUNTER — Other Ambulatory Visit: Payer: Medicare HMO

## 2022-07-01 DIAGNOSIS — E785 Hyperlipidemia, unspecified: Secondary | ICD-10-CM

## 2022-07-01 DIAGNOSIS — R7303 Prediabetes: Secondary | ICD-10-CM | POA: Diagnosis not present

## 2022-07-01 DIAGNOSIS — E039 Hypothyroidism, unspecified: Secondary | ICD-10-CM | POA: Diagnosis not present

## 2022-07-02 ENCOUNTER — Encounter: Payer: Self-pay | Admitting: Family Medicine

## 2022-07-02 DIAGNOSIS — H5203 Hypermetropia, bilateral: Secondary | ICD-10-CM | POA: Diagnosis not present

## 2022-07-02 DIAGNOSIS — H2513 Age-related nuclear cataract, bilateral: Secondary | ICD-10-CM | POA: Diagnosis not present

## 2022-07-02 DIAGNOSIS — Z79899 Other long term (current) drug therapy: Secondary | ICD-10-CM | POA: Diagnosis not present

## 2022-07-02 LAB — LIPID PANEL
Chol/HDL Ratio: 2.5 ratio (ref 0.0–4.4)
Cholesterol, Total: 218 mg/dL — ABNORMAL HIGH (ref 100–199)
HDL: 88 mg/dL (ref >39)
LDL Chol Calc (NIH): 110 mg/dL — ABNORMAL HIGH (ref 0–99)
Triglycerides: 117 mg/dL (ref 0–149)
VLDL Cholesterol Cal: 20 mg/dL (ref 5–40)

## 2022-07-02 LAB — HEMOGLOBIN A1C
Est. average glucose Bld gHb Est-mCnc: 114 mg/dL
Hgb A1c MFr Bld: 5.6 % (ref 4.8–5.6)

## 2022-07-02 LAB — TSH: TSH: 1.29 u[IU]/mL (ref 0.450–4.500)

## 2022-07-09 DIAGNOSIS — Z79899 Other long term (current) drug therapy: Secondary | ICD-10-CM | POA: Diagnosis not present

## 2022-07-09 DIAGNOSIS — N1370 Vesicoureteral-reflux, unspecified: Secondary | ICD-10-CM | POA: Diagnosis not present

## 2022-07-09 DIAGNOSIS — Z880 Allergy status to penicillin: Secondary | ICD-10-CM | POA: Diagnosis not present

## 2022-07-09 DIAGNOSIS — N200 Calculus of kidney: Secondary | ICD-10-CM | POA: Diagnosis not present

## 2022-07-09 DIAGNOSIS — Q630 Accessory kidney: Secondary | ICD-10-CM | POA: Diagnosis not present

## 2022-07-09 DIAGNOSIS — Z79890 Hormone replacement therapy (postmenopausal): Secondary | ICD-10-CM | POA: Diagnosis not present

## 2022-07-09 DIAGNOSIS — N3941 Urge incontinence: Secondary | ICD-10-CM | POA: Diagnosis not present

## 2022-07-14 ENCOUNTER — Ambulatory Visit: Payer: BC Managed Care – PPO | Admitting: Internal Medicine

## 2022-07-22 ENCOUNTER — Other Ambulatory Visit: Payer: Self-pay | Admitting: Family Medicine

## 2022-07-22 DIAGNOSIS — F5104 Psychophysiologic insomnia: Secondary | ICD-10-CM

## 2022-07-23 NOTE — Telephone Encounter (Signed)
Please call the patient to schedule office visit with Dr. Corliss Skains

## 2022-07-24 NOTE — Telephone Encounter (Signed)
Is her 11:20am today not available?

## 2022-07-24 NOTE — Telephone Encounter (Signed)
Attempted to contact the patient and left message for patient to call the office and left message for patient to call the office.

## 2022-07-28 ENCOUNTER — Ambulatory Visit: Payer: BC Managed Care – PPO | Admitting: Internal Medicine

## 2022-07-30 ENCOUNTER — Ambulatory Visit: Payer: Medicare HMO | Admitting: Rheumatology

## 2022-07-31 ENCOUNTER — Encounter: Payer: Medicare HMO | Admitting: Family Medicine

## 2022-07-31 ENCOUNTER — Ambulatory Visit: Payer: Medicare HMO | Admitting: Rheumatology

## 2022-07-31 ENCOUNTER — Encounter: Payer: Self-pay | Admitting: Physician Assistant

## 2022-08-04 NOTE — Progress Notes (Deleted)
Office Visit Note  Patient: Marissa Larson             Date of Birth: 1955/01/16           MRN: [id number]             PCP: Sandre Kitty, MD Referring: Sandre Kitty, MD Visit Date: 08/07/2022 Occupation: @GUAROCC @  Subjective:  No chief complaint on file.   History of Present Illness: Marissa Larson is a 67 y.o. female ***     Activities of Daily Living:  Patient reports morning stiffness for *** {minute/hour:19697}.   Patient {ACTIONS;DENIES/REPORTS:21021675::"Denies"} nocturnal pain.  Difficulty dressing/grooming: {ACTIONS;DENIES/REPORTS:21021675::"Denies"} Difficulty climbing stairs: {ACTIONS;DENIES/REPORTS:21021675::"Denies"} Difficulty getting out of chair: {ACTIONS;DENIES/REPORTS:21021675::"Denies"} Difficulty using hands for taps, buttons, cutlery, and/or writing: {ACTIONS;DENIES/REPORTS:21021675::"Denies"}  No Rheumatology ROS completed.   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   Obesity, Class I, BMI 30-34.9 12/03/2016   Nausea 09/12/2016   High risk medication use 07/30/2016   NAFLD (nonalcoholic fatty liver disease) 40/98/1191   Primary osteoarthritis of both feet 04/12/2016   History of kidney stones 04/12/2016   Primary osteoarthritis of both hands 04/11/2016   Bilateral primary osteoarthritis of hip 04/11/2016   Primary osteoarthritis of both knees 04/11/2016   Spondylosis of lumbar region without myelopathy or radiculopathy 04/11/2016    Hyperuricemia 04/11/2016   Primary insomnia 04/11/2016   Flank pain 04/02/2016   Elevated HDL 11/11/2015   Elevated LFTs 11/11/2015   Adjustment disorder with mixed anxiety and depressed mood 11/11/2015   Hypothyroidism 10/14/2015   Pseudogout involving multiple joints 10/14/2015   Vitamin D deficiency 10/14/2015   Hormone replacement therapy- per GYN 10/14/2015   HTN (hypertension) 10/14/2015   Fatigue 10/14/2015   h/o Hiatal hernia 10/14/2015   Sleep difficulties 10/14/2015   Generalized OA 10/14/2015   Counseling on health promotion and disease prevention 10/14/2015   Gastric ulcer- due to Mobic 10/09/2015   Age-related nuclear cataract of both eyes 01/25/2015   Posterior vitreous detachment of left eye 01/25/2015   chronic Palpitations 07/19/2013    Past Medical History:  Diagnosis Date   Arthritis    Chest pain 06/2013   Left sided   Chronic leg pain 06/2013   Complication of anesthesia    Dysrhythmia    Fatigue    GERD (gastroesophageal reflux disease)    History of kidney stones    Hypertension    Hypothyroidism    Kidney stones 09/12/2016   Nonproductive cough 06/2013   Overweight 01/18/2014   Peripheral neuropathy    PONV (postoperative nausea and vomiting)    Sleep difficulties    Change in sleep patterns   SOB (shortness of breath) 06/2013   Tachycardia 06/2013   Thyroid disease    hypo   UTI (lower urinary tract infection)    Vesico-ureteric reflux     Family History  Problem Relation Age of Onset   Migraines Other        Fam Hx    Heart disease Other  Fam Hx   Thyroid disease Other        Fam Hx - She also has thyroid disease   Diabetes Other        Fam Hx   Arthritis Other        Fam Hx   Gallbladder disease Other        Fam Hx   Cancer Other        Fam Hx - Pancreatic   Hypertension Other        Fam Hx   Heart attack Cousin        Multiple cousins with MI's before 29 years of age   Heart disease Mother 11   Hyperlipidemia  Mother 20   Hypertension Mother 49   Stroke Mother 59   Heart disease Maternal Grandmother    Cancer Father 81       pancreatic   Diabetes Brother 50   Heart attack Maternal Uncle    Heart attack Paternal Aunt    Past Surgical History:  Procedure Laterality Date   ABDOMINAL SURGERY  1994   CYSTOSCOPY/URETEROSCOPY/HOLMIUM LASER/STENT PLACEMENT Left 09/22/2017   Procedure: CYSTOSCOPY/RETROGRADE/URETEROSCOPY/ BASKET STONE EXTRACTION/STENT PLACEMENT;  Surgeon: Rene Paci, MD;  Location: [facility];  Service: Urology;  Laterality: Left;  ONLY NEEDS 60 MIN   FOOT SURGERY Right 2009   KIDNEY SURGERY Left 1993   KNEE ARTHROPLASTY Right 11/02/2019   KNEE SURGERY     Multiple knee surgeries   OOPHORECTOMY Left 1994   REPLACEMENT TOTAL KNEE Left 10/13/2018   Dr. Thurston Hole   small fiber neuropathy biopsy  12/2017   TONSILLECTOMY     URETEROSCOPY VIA URETEROSTOMY Left 1994   Social History   Social History Narrative   Not on file   Immunization History  Administered Date(s) Administered   Tdap 01/07/2011     Objective: Vital Signs: There were no vitals taken for this visit.   Physical Exam   Musculoskeletal Exam: ***  CDAI Exam: CDAI Score: -- Patient Global: --; Provider Global: -- Swollen: --; Tender: -- Joint Exam 08/07/2022   No joint exam has been documented for this visit   There is currently no information documented on the homunculus. Go to the Rheumatology activity and complete the homunculus joint exam.  Investigation: No additional findings.  Imaging: No results found.  Recent Labs: Lab Results  Component Value Date   WBC 8.4 06/12/2022   HGB 14.5 06/12/2022   PLT 247 06/12/2022   NA 142 06/12/2022   K 4.3 06/12/2022   CL 104 06/12/2022   CO2 28 06/12/2022   GLUCOSE 104 (H) 06/12/2022   BUN 20 06/12/2022   CREATININE 0.68 06/12/2022   BILITOT 0.4 06/12/2022   ALKPHOS 103 10/31/2021   AST 18 06/12/2022   ALT 14 06/12/2022   PROT 6.9  06/12/2022   ALBUMIN 4.9 10/31/2021   CALCIUM 9.6 06/12/2022   GFRAA 108 08/17/2019   QFTBGOLDPLUS NEGATIVE 02/16/2017    Speciality Comments: PLQ eye exam: 07/02/2022 normal Sigmund Emily Filbert - [facility] Follow up in 1 year.  Procedures:  No procedures performed Allergies: Penicillins and Sulfa antibiotics   Assessment / Plan:     Visit Diagnoses: No diagnosis found.  Orders: No orders of the defined types were placed in this encounter.  No orders of the defined types were placed in this encounter.   Face-to-face time spent with patient was *** minutes. Greater than 50% of time was spent in counseling and coordination of care.  Follow-Up Instructions:  No follow-ups on file.   Ellen Henri, CMA  Note - This record has been created using Animal nutritionist.  Chart creation errors have been sought, but may not always  have been located. Such creation errors do not reflect on  the standard of medical care.

## 2022-08-05 ENCOUNTER — Other Ambulatory Visit: Payer: Self-pay | Admitting: Family Medicine

## 2022-08-05 DIAGNOSIS — E559 Vitamin D deficiency, unspecified: Secondary | ICD-10-CM

## 2022-08-07 ENCOUNTER — Ambulatory Visit: Payer: Medicare HMO | Admitting: Rheumatology

## 2022-08-07 DIAGNOSIS — K760 Fatty (change of) liver, not elsewhere classified: Secondary | ICD-10-CM

## 2022-08-07 DIAGNOSIS — E790 Hyperuricemia without signs of inflammatory arthritis and tophaceous disease: Secondary | ICD-10-CM

## 2022-08-07 DIAGNOSIS — M19071 Primary osteoarthritis, right ankle and foot: Secondary | ICD-10-CM

## 2022-08-07 DIAGNOSIS — F5101 Primary insomnia: Secondary | ICD-10-CM

## 2022-08-07 DIAGNOSIS — Z87442 Personal history of urinary calculi: Secondary | ICD-10-CM

## 2022-08-07 DIAGNOSIS — G629 Polyneuropathy, unspecified: Secondary | ICD-10-CM

## 2022-08-07 DIAGNOSIS — M1990 Unspecified osteoarthritis, unspecified site: Secondary | ICD-10-CM

## 2022-08-07 DIAGNOSIS — M8589 Other specified disorders of bone density and structure, multiple sites: Secondary | ICD-10-CM

## 2022-08-07 DIAGNOSIS — M47816 Spondylosis without myelopathy or radiculopathy, lumbar region: Secondary | ICD-10-CM

## 2022-08-07 DIAGNOSIS — M1189 Other specified crystal arthropathies, multiple sites: Secondary | ICD-10-CM

## 2022-08-07 DIAGNOSIS — K449 Diaphragmatic hernia without obstruction or gangrene: Secondary | ICD-10-CM

## 2022-08-07 DIAGNOSIS — Z96651 Presence of right artificial knee joint: Secondary | ICD-10-CM

## 2022-08-07 DIAGNOSIS — Z79899 Other long term (current) drug therapy: Secondary | ICD-10-CM

## 2022-08-07 DIAGNOSIS — M160 Bilateral primary osteoarthritis of hip: Secondary | ICD-10-CM

## 2022-08-07 DIAGNOSIS — M19041 Primary osteoarthritis, right hand: Secondary | ICD-10-CM

## 2022-08-07 DIAGNOSIS — M25552 Pain in left hip: Secondary | ICD-10-CM

## 2022-08-07 DIAGNOSIS — E559 Vitamin D deficiency, unspecified: Secondary | ICD-10-CM

## 2022-08-07 DIAGNOSIS — I10 Essential (primary) hypertension: Secondary | ICD-10-CM

## 2022-08-07 DIAGNOSIS — Z96652 Presence of left artificial knee joint: Secondary | ICD-10-CM

## 2022-08-11 DIAGNOSIS — R519 Headache, unspecified: Secondary | ICD-10-CM | POA: Diagnosis not present

## 2022-08-11 DIAGNOSIS — R42 Dizziness and giddiness: Secondary | ICD-10-CM | POA: Diagnosis not present

## 2022-08-11 DIAGNOSIS — R5383 Other fatigue: Secondary | ICD-10-CM | POA: Diagnosis not present

## 2022-08-11 DIAGNOSIS — E039 Hypothyroidism, unspecified: Secondary | ICD-10-CM | POA: Diagnosis not present

## 2022-08-11 NOTE — Progress Notes (Deleted)
Office Visit Note  Patient: Marissa Larson             Date of Birth: Jan 30, 1955           MRN: [id number]             PCP: Sandre Kitty, MD Referring: Sandre Kitty, MD Visit Date: 08/15/2022 Occupation: @GUAROCC @  Subjective:  No chief complaint on file.   History of Present Illness: Bryanah Nurmi is a 67 y.o. female ***     Activities of Daily Living:  Patient reports morning stiffness for *** {minute/hour:19697}.   Patient {ACTIONS;DENIES/REPORTS:21021675::"Denies"} nocturnal pain.  Difficulty dressing/grooming: {ACTIONS;DENIES/REPORTS:21021675::"Denies"} Difficulty climbing stairs: {ACTIONS;DENIES/REPORTS:21021675::"Denies"} Difficulty getting out of chair: {ACTIONS;DENIES/REPORTS:21021675::"Denies"} Difficulty using hands for taps, buttons, cutlery, and/or writing: {ACTIONS;DENIES/REPORTS:21021675::"Denies"}  No Rheumatology ROS completed.   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   Obesity, Class I, BMI 30-34.9 12/03/2016   Nausea 09/12/2016   High risk medication use 07/30/2016   NAFLD (nonalcoholic fatty liver disease) 29/52/8413   Primary osteoarthritis of both feet 04/12/2016   History of kidney stones 04/12/2016   Primary osteoarthritis of both hands 04/11/2016   Bilateral primary osteoarthritis of hip 04/11/2016   Primary osteoarthritis of both knees 04/11/2016   Spondylosis of lumbar region without myelopathy or radiculopathy 04/11/2016    Hyperuricemia 04/11/2016   Primary insomnia 04/11/2016   Flank pain 04/02/2016   Elevated HDL 11/11/2015   Elevated LFTs 11/11/2015   Adjustment disorder with mixed anxiety and depressed mood 11/11/2015   Hypothyroidism 10/14/2015   Pseudogout involving multiple joints 10/14/2015   Vitamin D deficiency 10/14/2015   Hormone replacement therapy- per GYN 10/14/2015   HTN (hypertension) 10/14/2015   Fatigue 10/14/2015   h/o Hiatal hernia 10/14/2015   Sleep difficulties 10/14/2015   Generalized OA 10/14/2015   Counseling on health promotion and disease prevention 10/14/2015   Gastric ulcer- due to Mobic 10/09/2015   Age-related nuclear cataract of both eyes 01/25/2015   Posterior vitreous detachment of left eye 01/25/2015   chronic Palpitations 07/19/2013    Past Medical History:  Diagnosis Date   Arthritis    Chest pain 06/2013   Left sided   Chronic leg pain 06/2013   Complication of anesthesia    Dysrhythmia    Fatigue    GERD (gastroesophageal reflux disease)    History of kidney stones    Hypertension    Hypothyroidism    Kidney stones 09/12/2016   Nonproductive cough 06/2013   Overweight 01/18/2014   Peripheral neuropathy    PONV (postoperative nausea and vomiting)    Sleep difficulties    Change in sleep patterns   SOB (shortness of breath) 06/2013   Tachycardia 06/2013   Thyroid disease    hypo   UTI (lower urinary tract infection)    Vesico-ureteric reflux     Family History  Problem Relation Age of Onset   Migraines Other        Fam Hx    Heart disease Other  Fam Hx   Thyroid disease Other        Fam Hx - She also has thyroid disease   Diabetes Other        Fam Hx   Arthritis Other        Fam Hx   Gallbladder disease Other        Fam Hx   Cancer Other        Fam Hx - Pancreatic   Hypertension Other        Fam Hx   Heart attack Cousin        Multiple cousins with MI's before 28 years of age   Heart disease Mother 89   Hyperlipidemia  Mother 35   Hypertension Mother 82   Stroke Mother 59   Heart disease Maternal Grandmother    Cancer Father 59       pancreatic   Diabetes Brother 50   Heart attack Maternal Uncle    Heart attack Paternal Aunt    Past Surgical History:  Procedure Laterality Date   ABDOMINAL SURGERY  1994   CYSTOSCOPY/URETEROSCOPY/HOLMIUM LASER/STENT PLACEMENT Left 09/22/2017   Procedure: CYSTOSCOPY/RETROGRADE/URETEROSCOPY/ BASKET STONE EXTRACTION/STENT PLACEMENT;  Surgeon: Rene Paci, MD;  Location: [facility];  Service: Urology;  Laterality: Left;  ONLY NEEDS 60 MIN   FOOT SURGERY Right 2009   KIDNEY SURGERY Left 1993   KNEE ARTHROPLASTY Right 11/02/2019   KNEE SURGERY     Multiple knee surgeries   OOPHORECTOMY Left 1994   REPLACEMENT TOTAL KNEE Left 10/13/2018   Dr. Thurston Hole   small fiber neuropathy biopsy  12/2017   TONSILLECTOMY     URETEROSCOPY VIA URETEROSTOMY Left 1994   Social History   Social History Narrative   Not on file   Immunization History  Administered Date(s) Administered   Tdap 01/07/2011     Objective: Vital Signs: There were no vitals taken for this visit.   Physical Exam   Musculoskeletal Exam: ***  CDAI Exam: CDAI Score: -- Patient Global: --; Provider Global: -- Swollen: --; Tender: -- Joint Exam 08/15/2022   No joint exam has been documented for this visit   There is currently no information documented on the homunculus. Go to the Rheumatology activity and complete the homunculus joint exam.  Investigation: No additional findings.  Imaging: No results found.  Recent Labs: Lab Results  Component Value Date   WBC 8.4 06/12/2022   HGB 14.5 06/12/2022   PLT 247 06/12/2022   NA 142 06/12/2022   K 4.3 06/12/2022   CL 104 06/12/2022   CO2 28 06/12/2022   GLUCOSE 104 (H) 06/12/2022   BUN 20 06/12/2022   CREATININE 0.68 06/12/2022   BILITOT 0.4 06/12/2022   ALKPHOS 103 10/31/2021   AST 18 06/12/2022   ALT 14 06/12/2022   PROT 6.9  06/12/2022   ALBUMIN 4.9 10/31/2021   CALCIUM 9.6 06/12/2022   GFRAA 108 08/17/2019   QFTBGOLDPLUS NEGATIVE 02/16/2017    Speciality Comments: PLQ eye exam: 07/02/2022 normal Sigmund Emily Filbert - [facility] Follow up in 1 year.  Procedures:  No procedures performed Allergies: Penicillins and Sulfa antibiotics   Assessment / Plan:     Visit Diagnoses: No diagnosis found.  Orders: No orders of the defined types were placed in this encounter.  No orders of the defined types were placed in this encounter.   Face-to-face time spent with patient was *** minutes. Greater than 50% of time was spent in counseling and coordination of care.  Follow-Up Instructions:  No follow-ups on file.   Ellen Henri, CMA  Note - This record has been created using Animal nutritionist.  Chart creation errors have been sought, but may not always  have been located. Such creation errors do not reflect on  the standard of medical care.

## 2022-08-12 NOTE — Progress Notes (Unsigned)
Electrophysiology Office Note:   Date:  08/13/2022  ID:  Marissa Larson, DOB 27-Mar-1955, MRN [id number]  Primary Cardiologist: None Electrophysiologist: None      History of Present Illness:   Marissa Larson is a 67 y.o. female with h/o palpitations / symptomatic PVC's seen today for re-establishment with electrophysiology.   Last seen by Dr. Ladona Ridgel in 2016 for palpitations / symptomatic PVC's.   Since last being seen in our clinic the patient reports she recently has been experiencing 1-2 months of nocturnal palpitations that feel differently than her prior PVC's.  She may have some anxiety associated with it but is not exactly sure.  Also reports left sided chest discomfort that she describes as a burning / throbbing pain.  This pain occurs while at rest and when active. She does not note a specific correlation that resting relieves the pain. She occasionally feels dizzy.    She denies chest pain, palpitations, dyspnea, PND, orthopnea, nausea, vomiting, dizziness, syncope, edema, weight gain, or early satiety.   Review of systems complete and found to be negative unless listed in HPI.   EP Information / Studies Reviewed:    EKG is ordered today. Personal review as below.  EKG Interpretation Date/Time:  Wednesday August 13 2022 10:07:08 EDT Ventricular Rate:  75 PR Interval:  162 QRS Duration:  86 QT Interval:  376 QTC Calculation: 419 R Axis:   -5  Text Interpretation: Normal sinus rhythm Normal ECG When compared with ECG of 13-Oct-2019 11:35, Confirmed by Canary Brim ([id number]) on 08/13/2022 2:36:57 PM   Studies:  ECHO 07/2013 > LVEF 60-65%, no RWMA, grade I DD Lexiscan Myoview 10/2019 > normal study without ischemia or infarction, normal LVEF 69%  Risk Assessment/Calculations:     HYPERTENSION CONTROL Vitals:   08/13/22 1009 08/13/22 1437  BP: (!) 142/90 (!) 140/90    The patient's blood pressure is elevated above target today.  In order to address the patient's elevated  BP: Blood pressure will be monitored at home to determine if medication changes need to be made.   See plan below for BP.       Physical Exam:   VS:  BP (!) 140/90   Pulse 75   Ht 5\' 2"  (1.575 m)   Wt 173 lb (78.5 kg)   SpO2 98%   BMI 31.64 kg/m    Wt Readings from Last 3 Encounters:  08/13/22 173 lb (78.5 kg)  06/24/22 177 lb 1.9 oz (80.3 kg)  06/12/22 179 lb 9.6 oz (81.5 kg)     GEN: Well nourished, well developed in no acute distress NECK: No JVD; No carotid bruits CARDIAC: Regular rate and rhythm, no murmurs, rubs, gallops. Not able to reproduce pain with chest wall pressure (per pt) RESPIRATORY:  Clear to auscultation without rales, wheezing or rhonchi  ABDOMEN: Soft, non-tender, non-distended EXTREMITIES:  No edema; No deformity   ASSESSMENT AND PLAN:    Symptomatic PVC's / Palpitations  -recurrent in last 1-2 months  -2 week monitor to review palpitation symptoms  -discussed use of toprol pending results of above   Chest Pain  -atypical in description, no specific triggers > occurs at rest and with exertion -assess Lexi-Scan Myoview   Elevated BP Occasional Dizziness  -query if dizziness is related to BP -mildly elevated in clinic > asked pt to keep record of BP at home and bring back to clinic  -may need anti-hypertensive agent pending review  Follow up with EP APP in 4 weeks  Signed, Merry Proud  Veleta Miners, MSN, APRN, NP-C, AGACNP-BC [facility] - Electrophysiology  08/13/2022, 2:43 PM

## 2022-08-13 ENCOUNTER — Ambulatory Visit (INDEPENDENT_AMBULATORY_CARE_PROVIDER_SITE_OTHER): Payer: Medicare HMO

## 2022-08-13 ENCOUNTER — Encounter: Payer: Self-pay | Admitting: *Deleted

## 2022-08-13 ENCOUNTER — Ambulatory Visit: Payer: Medicare HMO | Attending: Student | Admitting: Pulmonary Disease

## 2022-08-13 ENCOUNTER — Encounter: Payer: Self-pay | Admitting: Student

## 2022-08-13 VITALS — BP 140/90 | HR 75 | Ht 62.0 in | Wt 173.0 lb

## 2022-08-13 DIAGNOSIS — R072 Precordial pain: Secondary | ICD-10-CM

## 2022-08-13 DIAGNOSIS — R002 Palpitations: Secondary | ICD-10-CM | POA: Diagnosis not present

## 2022-08-13 NOTE — Progress Notes (Unsigned)
Enrolled patient for a 14 day Zio XT monitor to be mailed to patients home  Marissa Larson to read

## 2022-08-13 NOTE — Patient Instructions (Addendum)
Medication Instructions:  Your physician recommends that you continue on your current medications as directed. Please refer to the Current Medication list given to you today.  *If you need a refill on your cardiac medications before your next appointment, please call your pharmacy*  Lab Work: None ordered If you have labs (blood work) drawn today and your tests are completely normal, you will receive your results only by: MyChart Message (if you have MyChart) OR A paper copy in the mail If you have any lab test that is abnormal or we need to change your treatment, we will call you to review the results.  Testing/Procedures: Your physician has requested that you have a lexiscan myoview. For further information please visit [email or web address] Please follow instruction sheet, as given.   ZIO XT- Long Term Monitor Instructions  Your physician has requested you wear a ZIO patch monitor for 14 days.  This is a single patch monitor. [employer] supplies one patch monitor per enrollment. Additional stickers are not available. Please do not apply patch if you will be having a Nuclear Stress Test,  Echocardiogram, Cardiac CT, MRI, or Chest Xray during the period you would be wearing the  monitor. The patch cannot be worn during these tests. You cannot remove and re-apply the  ZIO XT patch monitor.  Your ZIO patch monitor will be mailed 3 day USPS to your address on file. It may take 3-5 days  to receive your monitor after you have been enrolled.  Once you have received your monitor, please review the enclosed instructions. Your monitor  has already been registered assigning a specific monitor serial # to you.  Billing and Patient Assistance Program Information  We have supplied [employer] with any of your insurance information on file for billing purposes. [employer] offers a sliding scale Patient Assistance Program for patients that do not have  insurance, or whose insurance does not completely cover  the cost of the ZIO monitor.  You must apply for the Patient Assistance Program to qualify for this discounted rate.  To apply, please call [employer] at [phone], select option 4, select option 2, ask to apply for  Patient Assistance Program. Meredeth Ide will ask your household income, and how many people  are in your household. They will quote your out-of-pocket cost based on that information.  [employer] will also be able to set up a 63-month, interest-free payment plan if needed.  Applying the monitor   Shave hair from upper left chest.  Hold abrader disc by orange tab. Rub abrader in 40 strokes over the upper left chest as  indicated in your monitor instructions.  Clean area with 4 enclosed alcohol pads. Let dry.  Apply patch as indicated in monitor instructions. Patch will be placed under collarbone on left  side of chest with arrow pointing upward.  Rub patch adhesive wings for 2 minutes. Remove white label marked "1". Remove the white  label marked "2". Rub patch adhesive wings for 2 additional minutes.  While looking in a mirror, press and release button in center of patch. A small green light will  flash 3-4 times. This will be your only indicator that the monitor has been turned on.  Do not shower for the first 24 hours. You may shower after the first 24 hours.  Press the button if you feel a symptom. You will hear a small click. Record Date, Time and  Symptom in the Patient Logbook.  When you are ready to remove the patch, follow instructions on  the last 2 pages of Patient  Logbook. Stick patch monitor onto the last page of Patient Logbook.  Place Patient Logbook in the blue and white box. Use locking tab on box and tape box closed  securely. The blue and white box has prepaid postage on it. Please place it in the mailbox as  soon as possible. Your physician should have your test results approximately 7 days after the  monitor has been mailed back to [facility].  Call [facility] Customer Care at [phone] if you have questions regarding  your ZIO XT patch monitor. Call them immediately if you see an orange light blinking on your  monitor.  If your monitor falls off in less than 4 days, contact our Monitor department at [phone].  If your monitor becomes loose or falls off after 4 days call [employer] at [phone] for  suggestions on securing your monitor   Follow-Up: At [facility], you and your health needs are our priority.  As part of our continuing mission to provide you with exceptional heart care, we have created designated Provider Care Teams.  These Care Teams include your primary Cardiologist (physician) and Advanced Practice Providers (APPs -  Physician Assistants and Nurse Practitioners) who all work together to provide you with the care you need, when you need it.  We recommend signing up for the patient portal called "MyChart".  Sign up information is provided on this After Visit Summary.  MyChart is used to connect with patients for Virtual Visits (Telemedicine).  Patients are able to view lab/test results, encounter notes, upcoming appointments, etc.  Non-urgent messages can be sent to your provider as well.   To learn more about what you can do with MyChart, go to [email or web address].    Your next appointment:   4 week(s)  Provider:   You may see Dr Ladona Ridgel or one of the following Advanced Practice Providers on your designated Care Team:   Francis Dowse, PA-C [facility] Needle [address] [place], [place] Sherie Don, NP Canary Brim, NP

## 2022-08-14 ENCOUNTER — Telehealth: Payer: Self-pay | Admitting: Rheumatology

## 2022-08-14 ENCOUNTER — Telehealth (HOSPITAL_COMMUNITY): Payer: Self-pay | Admitting: *Deleted

## 2022-08-14 NOTE — Telephone Encounter (Signed)
Dr. Corliss Skains and Ladona Ridgel recommend the patient come into the office in case she needs to start on a new medication. [facility] for patient call office regarding appt.

## 2022-08-14 NOTE — Telephone Encounter (Signed)
Attempted to contact the patient and left message for patient to call the office.  

## 2022-08-14 NOTE — Telephone Encounter (Signed)
Patient given detailed instructions per Myocardial Perfusion Study Information Sheet for the test on 08/18/2022 at 10:45. Patient notified to arrive 15 minutes early and that it is imperative to arrive on time for appointment to keep from having the test rescheduled.  If you need to cancel or reschedule your appointment, please call the office within 24 hours of your appointment. . Patient verbalized understanding.Marissa Larson

## 2022-08-14 NOTE — Telephone Encounter (Signed)
Pt is requesting a virtual visit due to power outage at her house. Appt. is scheduled on 08/15/22 at 9:40.  A good call back number is [phone].

## 2022-08-14 NOTE — Telephone Encounter (Signed)
Please ask the patient if she would prefer a virtual visit tomorrow versus an office visit at a later date.  We would not be able to start her on any medication on a virtual visit.

## 2022-08-15 ENCOUNTER — Ambulatory Visit: Payer: Medicare HMO | Admitting: Rheumatology

## 2022-08-15 DIAGNOSIS — Z96652 Presence of left artificial knee joint: Secondary | ICD-10-CM

## 2022-08-15 DIAGNOSIS — M1990 Unspecified osteoarthritis, unspecified site: Secondary | ICD-10-CM

## 2022-08-15 DIAGNOSIS — Z79899 Other long term (current) drug therapy: Secondary | ICD-10-CM

## 2022-08-15 DIAGNOSIS — K449 Diaphragmatic hernia without obstruction or gangrene: Secondary | ICD-10-CM

## 2022-08-15 DIAGNOSIS — Z96651 Presence of right artificial knee joint: Secondary | ICD-10-CM

## 2022-08-15 DIAGNOSIS — K760 Fatty (change of) liver, not elsewhere classified: Secondary | ICD-10-CM

## 2022-08-15 DIAGNOSIS — M1189 Other specified crystal arthropathies, multiple sites: Secondary | ICD-10-CM

## 2022-08-15 DIAGNOSIS — I10 Essential (primary) hypertension: Secondary | ICD-10-CM

## 2022-08-15 DIAGNOSIS — M8589 Other specified disorders of bone density and structure, multiple sites: Secondary | ICD-10-CM

## 2022-08-15 DIAGNOSIS — E559 Vitamin D deficiency, unspecified: Secondary | ICD-10-CM

## 2022-08-15 DIAGNOSIS — F5101 Primary insomnia: Secondary | ICD-10-CM

## 2022-08-15 DIAGNOSIS — G629 Polyneuropathy, unspecified: Secondary | ICD-10-CM

## 2022-08-15 DIAGNOSIS — M160 Bilateral primary osteoarthritis of hip: Secondary | ICD-10-CM

## 2022-08-15 DIAGNOSIS — M19071 Primary osteoarthritis, right ankle and foot: Secondary | ICD-10-CM

## 2022-08-15 DIAGNOSIS — M25552 Pain in left hip: Secondary | ICD-10-CM

## 2022-08-15 DIAGNOSIS — Z87442 Personal history of urinary calculi: Secondary | ICD-10-CM

## 2022-08-15 DIAGNOSIS — E790 Hyperuricemia without signs of inflammatory arthritis and tophaceous disease: Secondary | ICD-10-CM

## 2022-08-15 DIAGNOSIS — M47816 Spondylosis without myelopathy or radiculopathy, lumbar region: Secondary | ICD-10-CM

## 2022-08-15 DIAGNOSIS — M19042 Primary osteoarthritis, left hand: Secondary | ICD-10-CM

## 2022-08-18 ENCOUNTER — Ambulatory Visit (HOSPITAL_COMMUNITY): Payer: Medicare HMO | Attending: Pulmonary Disease

## 2022-08-18 DIAGNOSIS — R072 Precordial pain: Secondary | ICD-10-CM | POA: Diagnosis not present

## 2022-08-18 LAB — MYOCARDIAL PERFUSION IMAGING
LV dias vol: 79 mL (ref 46–106)
LV sys vol: 22 mL
Nuc Stress EF: 72 %
Peak HR: 72 {beats}/min
Rest HR: 66 {beats}/min
Rest Nuclear Isotope Dose: 10.8 mCi
SDS: 1
SRS: 0
SSS: 1
ST Depression (mm): 0 mm
Stress Nuclear Isotope Dose: 32.2 mCi
TID: 0.95

## 2022-08-18 MED ORDER — REGADENOSON 0.4 MG/5ML IV SOLN
0.4000 mg | Freq: Once | INTRAVENOUS | Status: AC
Start: 2022-08-18 — End: 2022-08-18
  Administered 2022-08-18: 0.4 mg via INTRAVENOUS

## 2022-08-18 MED ORDER — TECHNETIUM TC 99M TETROFOSMIN IV KIT
10.8000 | PACK | Freq: Once | INTRAVENOUS | Status: AC | PRN
  Administered 2022-08-18: 10.8 via INTRAVENOUS

## 2022-08-18 MED ORDER — TECHNETIUM TC 99M TETROFOSMIN IV KIT
32.2000 | PACK | Freq: Once | INTRAVENOUS | Status: AC | PRN
  Administered 2022-08-18: 32.2 via INTRAVENOUS

## 2022-08-18 NOTE — Progress Notes (Unsigned)
Office Visit Note  Patient: Marissa Larson             Date of Birth: [birth year]           MRN: [id number]             PCP: Sandre Kitty, MD Referring: Sandre Kitty, MD Visit Date: 08/19/2022 Occupation: @GUAROCC @  Subjective:  Pain in multiple joints  History of Present Illness: Marissa Larson is a 67 y.o. female with history of inflammatory arthritis and osteoarthritis overlap.  She was started on hydroxychloroquine at the last visit on June 12, 2022.  Patient states she started having diarrhea and she cut back the medication to only 1 tablet a day.  She states in the meantime she developed some shortness of breath congestion.  She also started experiencing dizziness and headaches.  She stopped hydroxychloroquine about 3 weeks ago.  She states the symptoms still did not resolve.  She was evaluated by her PCP.  She was told that she had sinus congestion.  She was also seen by cardiologist and had a stress test.  She is on a heart monitor for the next 2 weeks.  The workup so far has been negative.  She continues to have pain and discomfort in her cervical spine, lower back, shoulders and her wrist.  She also have some discomfort in her hips.  Continues to have pain and discomfort in her replaced knee joints.    Activities of Daily Living:  Patient reports morning stiffness for all day. Patient Reports nocturnal pain.  Difficulty dressing/grooming: Denies Difficulty climbing stairs: Reports Difficulty getting out of chair: Reports Difficulty using hands for taps, buttons, cutlery, and/or writing: Reports  Review of Systems  Constitutional:  Positive for fatigue.  HENT:  Positive for mouth dryness. Negative for mouth sores.   Eyes:  Negative for dryness.  Respiratory:  Negative for shortness of breath.   Cardiovascular:  Positive for palpitations. Negative for chest pain.  Gastrointestinal:  Negative for blood in stool, constipation and diarrhea.  Endocrine: Positive for  increased urination.  Genitourinary:  Positive for painful urination and involuntary urination.  Musculoskeletal:  Positive for joint pain, gait problem, joint pain, joint swelling, muscle weakness and morning stiffness. Negative for myalgias, muscle tenderness and myalgias.  Skin:  Positive for color change. Negative for rash, hair loss and sensitivity to sunlight.  Allergic/Immunologic: Negative for susceptible to infections.  Neurological:  Positive for dizziness, numbness, headaches and parasthesias.  Hematological:  Positive for swollen glands.  Psychiatric/Behavioral:  Positive for sleep disturbance. Negative for depressed mood. The patient is not nervous/anxious.     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   Obesity, Class I, BMI 30-34.9 12/03/2016   Nausea 09/12/2016   High risk medication use 07/30/2016   NAFLD (nonalcoholic fatty liver disease) 40/98/1191   Primary osteoarthritis of both feet 04/12/2016   History of kidney stones 04/12/2016   Primary osteoarthritis of both hands 04/11/2016   Bilateral primary osteoarthritis of hip  04/11/2016   Primary osteoarthritis of both knees 04/11/2016   Spondylosis of lumbar region without myelopathy or radiculopathy 04/11/2016   Hyperuricemia 04/11/2016   Primary insomnia 04/11/2016   Flank pain 04/02/2016   Elevated HDL 11/11/2015   Elevated LFTs 11/11/2015   Adjustment disorder with mixed anxiety and depressed mood 11/11/2015    Hypothyroidism 10/14/2015   Pseudogout involving multiple joints 10/14/2015   Vitamin D deficiency 10/14/2015   Hormone replacement therapy- per GYN 10/14/2015   HTN (hypertension) 10/14/2015   Fatigue 10/14/2015   h/o Hiatal hernia 10/14/2015   Sleep difficulties 10/14/2015   Generalized OA 10/14/2015   Counseling on health promotion and disease prevention 10/14/2015   Gastric ulcer- due to Mobic 10/09/2015   Age-related nuclear cataract of both eyes 01/25/2015   Posterior vitreous detachment of left eye 01/25/2015   chronic Palpitations 07/19/2013    Past Medical History:  Diagnosis Date   Arthritis    Chest pain 06/2013   Left sided   Chronic leg pain 06/2013   Complication of anesthesia    Dysrhythmia    Fatigue    GERD (gastroesophageal reflux disease)    History of kidney stones    Hypertension    Hypothyroidism    Kidney stones 09/12/2016   Nonproductive cough 06/2013   Overweight 01/18/2014   Peripheral neuropathy    PONV (postoperative nausea and vomiting)    Sleep difficulties    Change in sleep patterns   SOB (shortness of breath) 06/2013   Tachycardia 06/2013   Thyroid disease    hypo   UTI (lower urinary tract infection)    Vesico-ureteric reflux     Family History  Problem Relation Age of Onset   Migraines Other        Fam Hx    Heart disease Other        Fam Hx   Thyroid disease Other        Fam Hx - She also has thyroid disease   Diabetes Other        Fam Hx   Arthritis Other        Fam Hx   Gallbladder disease Other        Fam Hx   Cancer Other        Fam Hx - Pancreatic   Hypertension Other        Fam Hx   Heart attack Cousin        Multiple cousins with MI's before 77 years of age   Heart disease Mother 96   Hyperlipidemia Mother 57   Hypertension Mother 34   Stroke Mother 2   Heart disease Maternal Grandmother    Cancer Father 67       pancreatic   Diabetes Brother 50   Heart attack Maternal Uncle    Heart attack Paternal  Aunt    Past Surgical History:  Procedure Laterality Date   ABDOMINAL SURGERY  1994   CYSTOSCOPY/URETEROSCOPY/HOLMIUM LASER/STENT PLACEMENT Left 09/22/2017   Procedure: CYSTOSCOPY/RETROGRADE/URETEROSCOPY/ BASKET STONE EXTRACTION/STENT PLACEMENT;  Surgeon: Rene Paci, MD;  Location: [facility];  Service: Urology;  Laterality: Left;  ONLY NEEDS 60 MIN   FOOT SURGERY Right 2009   KIDNEY SURGERY Left 1993   KNEE ARTHROPLASTY Right 11/02/2019   KNEE SURGERY     Multiple knee surgeries   OOPHORECTOMY Left 1994   REPLACEMENT TOTAL KNEE Left 10/13/2018   Dr. Thurston Hole   small fiber neuropathy biopsy  12/2017   TONSILLECTOMY  URETEROSCOPY VIA URETEROSTOMY Left 1994   Social History   Social History Narrative   Not on file   Immunization History  Administered Date(s) Administered   Tdap 01/07/2011     Objective: Vital Signs: BP 136/74 (BP Location: Left Arm, Patient Position: Sitting, Cuff Size: Normal)   Pulse 74   Resp 16   Ht 5\' 2"  (1.575 m)   Wt 175 lb (79.4 kg)   BMI 32.01 kg/m    Physical Exam Vitals and nursing note reviewed.  Constitutional:      Appearance: She is well-developed.  HENT:     Head: Normocephalic and atraumatic.  Eyes:     Conjunctiva/sclera: Conjunctivae normal.  Cardiovascular:     Rate and Rhythm: Normal rate and regular rhythm.     Heart sounds: Normal heart sounds.  Pulmonary:     Effort: Pulmonary effort is normal.     Breath sounds: Normal breath sounds.  Abdominal:     General: Bowel sounds are normal.     Palpations: Abdomen is soft.  Musculoskeletal:     Cervical back: Normal range of motion.  Lymphadenopathy:     Cervical: No cervical adenopathy.  Skin:    General: Skin is warm and dry.     Capillary Refill: Capillary refill takes less than 2 seconds.  Neurological:     Mental Status: She is alert and oriented to person, place, and time.  Psychiatric:        Behavior: Behavior normal.      Musculoskeletal Exam:  Patient had limited range of motion of the cervical spine with discomfort.  Thoracic kyphosis was present.  She had limited painful range of motion of the lumbar spine.  Shoulder joints, elbow joints, wrist joints were in good range of motion.  She had discomfort range of motion of shoulder joints.  Wrist joints, MCPs PIPs and DIPs with good range of motion with no synovitis.  She had limited range of motion of bilateral hip joints.  Knee joints are replaced with good range of motion without any warmth swelling or effusion.  There was no tenderness over ankles or MTPs.  CDAI Exam: CDAI Score: -- Patient Global: --; Provider Global: -- Swollen: --; Tender: -- Joint Exam 08/19/2022   No joint exam has been documented for this visit   There is currently no information documented on the homunculus. Go to the Rheumatology activity and complete the homunculus joint exam.  Investigation: No additional findings.  Imaging: MYOCARDIAL PERFUSION IMAGING  Result Date: 08/18/2022   Findings are consistent with no ischemia. The study is low risk.   No ST deviation was noted.   Left ventricular function is normal. Nuclear stress EF: 72%. The left ventricular ejection fraction is hyperdynamic (>65%). End diastolic cavity size is normal. End systolic cavity size is normal.   Prior study available for comparison from 10/26/2019. Small apical perfusion defect in rest and stress with normal function, suggestive of apical thinning. Unchanged from 2021 study.    Recent Labs: Lab Results  Component Value Date   WBC 8.4 06/12/2022   HGB 14.5 06/12/2022   PLT 247 06/12/2022   NA 142 06/12/2022   K 4.3 06/12/2022   CL 104 06/12/2022   CO2 28 06/12/2022   GLUCOSE 104 (H) 06/12/2022   BUN 20 06/12/2022   CREATININE 0.68 06/12/2022   BILITOT 0.4 06/12/2022   ALKPHOS 103 10/31/2021   AST 18 06/12/2022   ALT 14 06/12/2022   PROT 6.9 06/12/2022  ALBUMIN 4.9 10/31/2021   CALCIUM 9.6 06/12/2022   GFRAA 108  08/17/2019   QFTBGOLDPLUS NEGATIVE 02/16/2017    Speciality Comments: PLQ eye exam: 07/02/2022 normal Elise Benne - [facility] Follow up in 1 year.  Procedures:  No procedures performed Allergies: Penicillins and Sulfa antibiotics   Assessment / Plan:     Visit Diagnoses: Inflammatory arthritis - Previous therapy: Celebrex-GI side effects, meloxicam-ulcers, colchicine-paresthesias/neuropathy, Plaquenil-inadequate response, dizziness, headaches, diarrhea.  Patient gives history of inflammatory arthritis.  I do not see any synovitis on the examination.  Patient states she tried Plaquenil after the last visit but could not tolerated because of side effects.  She came off the medication about 3 weeks ago.  On the examination today I did not notice any joint swelling.  She continues to have discomfort due to underlying osteoarthritis.  I discouraged the use of NSAIDs due to history of gastric ulcer and GI side effects.  I do not see the need for an immunosuppressive agent.  All serology and sed rate were normal in the past.  Use of Tylenol, joint protection muscle strengthening was advised.  Benefits of water aerobics and summing were discussed.  Pseudogout involving multiple joints - Discontinued colchicine in October 2021 due to side effects.  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  No synovitis was noted.  Primary osteoarthritis of both hands -patient had no synovitis on the examination.  X-rays of both hands from 03/02/2019 were consistent with osteoarthritis and chondrocalcinosis.  Bilateral primary osteoarthritis of hip-she has limited range of motion of bilateral hip joints and has osteoarthritis.  Patient will be scheduling an appointment with Dr. August Saucer.  S/P total knee arthroplasty, right - Dr. Thurston Hole.  She can history of discomfort despite having surgery.  S/P total knee replacement, left - surgery by Dr. Thurston Hole in 2020.  Chronic pain.  Primary osteoarthritis of both feet -no synovitis was noted.   X-rays of both feet were consistent with osteoarthritis on 03/02/2019.  Spondylosis of lumbar region without myelopathy or radiculopathy-patient has chronic pain and discomfort.  Hyperuricemia  Osteopenia of multiple sites - April 2022 T score was -2.3 in the lumbar spine. Due to [facility]  Small fiber neuropathy  NAFLD (nonalcoholic fatty liver disease)  History of kidney stones  Vitamin D deficiency  Essential hypertension  Primary insomnia  h/o Hiatal hernia  Orders: No orders of the defined types were placed in this encounter.  No orders of the defined types were placed in this encounter.   Follow-Up Instructions: Return in about 6 months (around 02/19/2023) for Osteoarthritis.   Pollyann Savoy, MD  Note - This record has been created using Animal nutritionist.  Chart creation errors have been sought, but may not always  have been located. Such creation errors do not reflect on  the standard of medical care.

## 2022-08-19 ENCOUNTER — Ambulatory Visit: Payer: Medicare HMO | Attending: Rheumatology | Admitting: Rheumatology

## 2022-08-19 ENCOUNTER — Ambulatory Visit: Payer: Medicare HMO | Admitting: Rheumatology

## 2022-08-19 ENCOUNTER — Encounter: Payer: Self-pay | Admitting: Rheumatology

## 2022-08-19 VITALS — BP 136/74 | HR 74 | Resp 16 | Ht 62.0 in | Wt 175.0 lb

## 2022-08-19 DIAGNOSIS — E790 Hyperuricemia without signs of inflammatory arthritis and tophaceous disease: Secondary | ICD-10-CM | POA: Diagnosis not present

## 2022-08-19 DIAGNOSIS — M1990 Unspecified osteoarthritis, unspecified site: Secondary | ICD-10-CM

## 2022-08-19 DIAGNOSIS — M19041 Primary osteoarthritis, right hand: Secondary | ICD-10-CM

## 2022-08-19 DIAGNOSIS — M47816 Spondylosis without myelopathy or radiculopathy, lumbar region: Secondary | ICD-10-CM

## 2022-08-19 DIAGNOSIS — Z87442 Personal history of urinary calculi: Secondary | ICD-10-CM

## 2022-08-19 DIAGNOSIS — M1189 Other specified crystal arthropathies, multiple sites: Secondary | ICD-10-CM | POA: Diagnosis not present

## 2022-08-19 DIAGNOSIS — K760 Fatty (change of) liver, not elsewhere classified: Secondary | ICD-10-CM

## 2022-08-19 DIAGNOSIS — F5101 Primary insomnia: Secondary | ICD-10-CM

## 2022-08-19 DIAGNOSIS — Z96651 Presence of right artificial knee joint: Secondary | ICD-10-CM

## 2022-08-19 DIAGNOSIS — Z79899 Other long term (current) drug therapy: Secondary | ICD-10-CM

## 2022-08-19 DIAGNOSIS — M25552 Pain in left hip: Secondary | ICD-10-CM

## 2022-08-19 DIAGNOSIS — M19071 Primary osteoarthritis, right ankle and foot: Secondary | ICD-10-CM | POA: Diagnosis not present

## 2022-08-19 DIAGNOSIS — Z96652 Presence of left artificial knee joint: Secondary | ICD-10-CM

## 2022-08-19 DIAGNOSIS — G629 Polyneuropathy, unspecified: Secondary | ICD-10-CM

## 2022-08-19 DIAGNOSIS — K449 Diaphragmatic hernia without obstruction or gangrene: Secondary | ICD-10-CM

## 2022-08-19 DIAGNOSIS — R002 Palpitations: Secondary | ICD-10-CM

## 2022-08-19 DIAGNOSIS — M19072 Primary osteoarthritis, left ankle and foot: Secondary | ICD-10-CM

## 2022-08-19 DIAGNOSIS — E559 Vitamin D deficiency, unspecified: Secondary | ICD-10-CM

## 2022-08-19 DIAGNOSIS — M1380 Other specified arthritis, unspecified site: Secondary | ICD-10-CM

## 2022-08-19 DIAGNOSIS — M160 Bilateral primary osteoarthritis of hip: Secondary | ICD-10-CM | POA: Diagnosis not present

## 2022-08-19 DIAGNOSIS — M8589 Other specified disorders of bone density and structure, multiple sites: Secondary | ICD-10-CM | POA: Diagnosis not present

## 2022-08-19 DIAGNOSIS — M19042 Primary osteoarthritis, left hand: Secondary | ICD-10-CM

## 2022-08-19 DIAGNOSIS — I10 Essential (primary) hypertension: Secondary | ICD-10-CM

## 2022-09-04 DIAGNOSIS — R002 Palpitations: Secondary | ICD-10-CM | POA: Diagnosis not present

## 2022-09-05 ENCOUNTER — Other Ambulatory Visit: Payer: Self-pay | Admitting: Physician Assistant

## 2022-09-11 NOTE — Progress Notes (Signed)
  Electrophysiology Office Note:   Date:  09/12/2022  ID:  Mindel Verhelst, DOB [birth year], MRN [id number]  Primary Cardiologist: None Electrophysiologist: Lewayne Bunting, MD      History of Present Illness:   Marissa Larson is a 67 y.o. female with h/o palpitations and PVCs seen today for routine electrophysiology followup.   Seen 08/13/2022 and wore monitor and Myoview ordered. Both unremarkable as below.  Since last being seen in our clinic the patient reports doing very well. .  she denies chest pain, palpitations, dyspnea, PND, orthopnea, nausea, vomiting, dizziness, syncope, edema, weight gain, or early satiety.   Review of systems complete and found to be negative unless listed in HPI.   EP Information / Studies Reviewed:    EKG is not ordered today. EKG from 08/13/2022 reviewed which showed NSR at 75 bpm       Monitor 08/2022 min HR 26 bpm (Episode of Mobitz 1), max HR 129 bpm Avg HR of 74 bpm.  Predominant underlying rhythm was Sinus Rhythm.  Second Degree AV Block-Mobitz I (Wenckebach) was present.  Isolated SVEs were rare (<1.0%), SVE Couplets were rare (<1.0%), and SVE  Triplets were rare (<1.0%).  Isolated VEs were rare (<1.0%), VE Couplets were rare (<1.0%), and no VE Triplets were present. Ventricular Bigeminy and Trigeminy were present.   Myoview 08/18/2022 Findings are consistent with no ischemia. The study is low risk.   No ST deviation was noted.   Left ventricular function is normal. Nuclear stress EF: 72%. The left ventricular ejection fraction is hyperdynamic (>65%). End diastolic cavity size is normal. End systolic cavity size is normal.   Prior study available for comparison from 10/26/2019.  Physical Exam:   VS:  BP (!) 150/82   Pulse 71   Ht 5\' 2"  (1.575 m)   Wt 172 lb (78 kg)   SpO2 98%   BMI 31.46 kg/m    Wt Readings from Last 3 Encounters:  09/12/22 172 lb (78 kg)  08/19/22 175 lb (79.4 kg)  08/18/22 173 lb (78.5 kg)     GEN: Well nourished, well  developed in no acute distress NECK: No JVD; No carotid bruits CARDIAC: Regular rate and rhythm, no murmurs, rubs, gallops RESPIRATORY:  Clear to auscultation without rales, wheezing or rhonchi  ABDOMEN: Soft, non-tender, non-distended EXTREMITIES:  No edema; No deformity   ASSESSMENT AND PLAN:    Symptomatic PVC's / Palpitations  -recurrent in last 1-2 months  Very rare PACs/PVCs by monitor (all less than 1%) With nocturnal brady and intermittent Mobitz 1, would defer BB for now.    Chest Pain  Atypical in description, no specific triggers > occurs at rest and with exertion Myoview WNL as above.    Elevated BP Occasional Dizziness  Has improved off Plaquenil   Follow up with EP APP in 6 months  Signed, Graciella Freer, PA-C

## 2022-09-12 ENCOUNTER — Ambulatory Visit: Payer: Medicare HMO | Attending: Student | Admitting: Student

## 2022-09-12 ENCOUNTER — Encounter: Payer: Self-pay | Admitting: Student

## 2022-09-12 VITALS — BP 150/82 | HR 71 | Ht 62.0 in | Wt 172.0 lb

## 2022-09-12 DIAGNOSIS — I10 Essential (primary) hypertension: Secondary | ICD-10-CM | POA: Diagnosis not present

## 2022-09-12 DIAGNOSIS — R002 Palpitations: Secondary | ICD-10-CM

## 2022-09-12 DIAGNOSIS — R0789 Other chest pain: Secondary | ICD-10-CM

## 2022-09-12 NOTE — Patient Instructions (Addendum)
Medication Instructions:  Your physician recommends that you continue on your current medications as directed. Please refer to the Current Medication list given to you today.  *If you need a refill on your cardiac medications before your next appointment, please call your pharmacy*  Lab Work: None ordered If you have labs (blood work) drawn today and your tests are completely normal, you will receive your results only by: MyChart Message (if you have MyChart) OR A paper copy in the mail If you have any lab test that is abnormal or we need to change your treatment, we will call you to review the results.  Follow-Up: At [facility], you and your health needs are our priority.  As part of our continuing mission to provide you with exceptional heart care, we have created designated Provider Care Teams.  These Care Teams include your primary Cardiologist (physician) and Advanced Practice Providers (APPs -  Physician Assistants and Nurse Practitioners) who all work together to provide you with the care you need, when you need it.  Your next appointment:   6 month(s)  Provider:   Dr Ladona Ridgel

## 2022-09-15 ENCOUNTER — Other Ambulatory Visit: Payer: Self-pay | Admitting: Family Medicine

## 2022-09-15 DIAGNOSIS — F5104 Psychophysiologic insomnia: Secondary | ICD-10-CM

## 2022-09-16 ENCOUNTER — Other Ambulatory Visit: Payer: Self-pay | Admitting: Family Medicine

## 2022-09-16 DIAGNOSIS — F4323 Adjustment disorder with mixed anxiety and depressed mood: Secondary | ICD-10-CM

## 2022-10-09 ENCOUNTER — Encounter: Payer: Self-pay | Admitting: Internal Medicine

## 2022-10-09 ENCOUNTER — Ambulatory Visit: Payer: Medicare HMO | Admitting: Internal Medicine

## 2022-10-09 VITALS — BP 142/90 | HR 69 | Resp 20 | Ht 62.0 in | Wt 171.2 lb

## 2022-10-09 DIAGNOSIS — R7303 Prediabetes: Secondary | ICD-10-CM

## 2022-10-09 DIAGNOSIS — E039 Hypothyroidism, unspecified: Secondary | ICD-10-CM

## 2022-10-09 LAB — T4, FREE: Free T4: 0.87 ng/dL (ref 0.60–1.60)

## 2022-10-09 LAB — TSH: TSH: 1.42 u[IU]/mL (ref 0.35–5.50)

## 2022-10-09 MED ORDER — SYNTHROID 100 MCG PO TABS: 100.0000 ug | ORAL_TABLET | Freq: Every day | ORAL | 3 refills | Status: DC

## 2022-10-09 NOTE — Patient Instructions (Signed)
Please stop at the lab.  Please continue Synthroid 100 mcg daily.  Take the thyroid hormone every day, with water, at least 30 minutes before breakfast, separated by at least 4 hours from: - acid reflux medications - calcium - iron - multivitamins  Please come back for a follow-up appointment in 1 year.  

## 2022-10-09 NOTE — Progress Notes (Signed)
Patient ID: Marissa Larson, female   DOB: 04/10/1955, 67 y.o.   MRN: [id number]   HPI  Marissa Larson is a 67 y.o.-year-old female, initially referred by her PCP, Dr. Sharee Holster, returning for follow-up for hypothyroidism and prediabetes.  She previously saw Dr. Leslie Dales from 2017-2019.   Latest visit with me 1 year and 6 months ago.  Her husband is also my patient.  Interim history: Since last visit, she saw cardiology for palpitations. She has PVC, also has a 2nd degree AVB.  However, no intervention is needed. She feels more tired.  Also, she has pain due to calcium deposits in her left wrist.  She wears a brace.  Hypothyroidism  -Diagnosed at 48-21 y/o, when she presented with several symptoms including fatigue   She is on Synthroid DAW 100 mcg daily, latest dose change 04/2021: - in am - fasting - at least 30 min from b'fast - no calcium - no iron - no multivitamins - no PPIs, but takes Pepcid- at night - prn - not on Biotin  Reviewed her TFTs: Lab Results  Component Value Date   TSH 1.290 07/01/2022   TSH 0.469 10/31/2021   TSH 1.83 05/20/2021   TSH 0.28 (L) 04/08/2021   TSH 0.421 (L) 12/11/2020   TSH 0.27 (L) 09/14/2020   TSH 0.192 (L) 08/08/2020   TSH 0.44 03/09/2020   TSH 1.300 08/17/2019   TSH 1.17 05/19/2019   FREET4 0.93 05/20/2021   FREET4 1.15 04/08/2021   FREET4 1.61 12/11/2020   FREET4 0.94 09/14/2020   FREET4 1.55 08/08/2020   FREET4 1.10 03/09/2020   FREET4 1.16 05/19/2019   FREET4 1.37 06/17/2018   FREET4 1.66 06/10/2017   FREET4 1.4 10/10/2015   T3FREE 3.1 06/17/2018   T3FREE 2.9 06/10/2017   T3FREE 3.2 10/10/2015   Per Dr. Altheimer's records: She was followed by an endocrinologist in [place] in about 1982-2012 prior to relocating here. TSH 1.680 on 09/29/13 on levothyroxine 112 mcg daily. TSH 1.450 on 03/23/14 on levothyroxine 112 mcg daily. TSH 1.450 on 07/21/14 on levothyroxine 112 mcg daily. Thyroid peroxidase (anti-TPO) antibody negative  and thyroglobulin antibody negative in 3/17. FT4 1.59 (0.82-1.77), TSH 0.338 (0.450-4.500) in 3/17 on levothyroxine 112 mcg daily. FT4 1.55 (0.82-1.77), TSH 0.753 (0.450-4.500) in 4/17 on levothyroxine 112 mcg daily. FT4 1.45 (0.82-1.77), TSH 0.846 (0.450-4.500) in 10/17 on levothyroxine 112 mcg daily. FT4 1.0 (0.6-1.4), TSH 1.492 (0.450-5.330) in 6/18 on levothyroxine 112 mcg daily. FT4 1.1 (0.6-1.4), TSH 0.605 (0.450-5.330) in 2/19 on levothyroxine 112 mcg daily. FT4 0.9 (0.6-1.4), TSH 0.486 (0.450-5.330) in 10/19 on levothyroxine 112 mcg daily.  Pt denies: - feeling nodules in neck - hoarseness - dysphagia - choking  She has + FH of thyroid disorders in: Mother (thyroidectomy for goiter) and sister, M aunt (thyroidectomy for goiter).  No family history of thyroid cancer. No history of radiation therapy to head or neck.  No recent use of iodine supplements.  Not on biotin.  Mild prediabetes:  Reviewed HbA1c levels: Lab Results  Component Value Date   HGBA1C 5.6 07/01/2022   HGBA1C 5.7 (H) 10/31/2021   HGBA1C 5.9 04/08/2021   HGBA1C 5.9 (H) 12/11/2020   HGBA1C 5.7 (H) 08/08/2020   HGBA1C 5.5 08/17/2019   HGBA1C 5.5 05/19/2019   HGBA1C 5.7 (H) 06/17/2018   HGBA1C 5.7 (H) 06/10/2017   HGBA1C 5.5 12/08/2016   HGBA1C 5.5 10/10/2015  03/09/2020: HbA1c 5.8%  She is not on any medications for her prediabetes. She is not checking sugars. She  is on a mostly plant-based diet (Dr. Dutch Quint), like her husband.   No CKD: Lab Results  Component Value Date   BUN 20 06/12/2022   BUN 15 10/31/2021   Lab Results  Component Value Date   CREATININE 0.68 06/12/2022   CREATININE 0.52 (L) 10/31/2021   + HL; latest lipids: Lab Results  Component Value Date   CHOL 218 (H) 07/01/2022   HDL 88 07/01/2022   LDLCALC 110 (H) 07/01/2022   TRIG 117 07/01/2022   CHOLHDL 2.5 07/01/2022  On omega-3 fatty acids and flaxseed oil.  Last eye exam: 04/2019 - No DR.   She has family history of  diabetes in brother.  She has fatty liver - lost weight (20 lbs) prev., but gained some back. She sees Dr. Corliss Skains for OA and pseudogout >> on Plaquenil and Colchicine >> off. She is on a Polyphenol supplement  - helps her arthritis. She has a history of kidney stones.  She had R TKR in 11/2019 >> slow recovery >> then hurt her back >> on pain medicines, steroids.  She had diarrhea in the past. She had extensive investigation for this >> believed to have been 2/2 Meloxicam. She also saw  integrative medicine. CT abd. scan from 05/27/2021 showed:   ROS: + see HPI  I reviewed pt's medications, allergies, PMH, social hx, family hx, and changes were documented in the history of present illness. Otherwise, unchanged from my initial visit note.  Past Medical History:  Diagnosis Date   Arthritis    Chest pain 06/2013   Left sided   Chronic leg pain 06/2013   Complication of anesthesia    Dysrhythmia    Fatigue    GERD (gastroesophageal reflux disease)    History of kidney stones    Hypertension    Hypothyroidism    Kidney stones 09/12/2016   Nonproductive cough 06/2013   Overweight 01/18/2014   Peripheral neuropathy    PONV (postoperative nausea and vomiting)    Sleep difficulties    Change in sleep patterns   SOB (shortness of breath) 06/2013   Tachycardia 06/2013   Thyroid disease    hypo   UTI (lower urinary tract infection)    Vesico-ureteric reflux    Past Surgical History:  Procedure Laterality Date   ABDOMINAL SURGERY  1994   CYSTOSCOPY/URETEROSCOPY/HOLMIUM LASER/STENT PLACEMENT Left 09/22/2017   Procedure: CYSTOSCOPY/RETROGRADE/URETEROSCOPY/ BASKET STONE EXTRACTION/STENT PLACEMENT;  Surgeon: Rene Paci, MD;  Location: [facility];  Service: Urology;  Laterality: Left;  ONLY NEEDS 60 MIN   FOOT SURGERY Right 2009   KIDNEY SURGERY Left 1993   KNEE ARTHROPLASTY Right 11/02/2019   KNEE SURGERY     Multiple knee surgeries   OOPHORECTOMY Left 1994    REPLACEMENT TOTAL KNEE Left 10/13/2018   Dr. Thurston Hole   small fiber neuropathy biopsy  12/2017   TONSILLECTOMY     URETEROSCOPY VIA URETEROSTOMY Left 1994   Social History   Socioeconomic History   Marital status: Married    Spouse name: Not on file   Number of children: 0   Years of education: Not on file   Highest education level: Not on file  Occupational History   Occupation: Business and [employer]  Tobacco Use   Smoking status: Never    Passive exposure: Past   Smokeless tobacco: Never  Vaping Use   Vaping status: Never Used  Substance and Sexual Activity   Alcohol use: Yes    Comment: occ   Drug use: No  Sexual activity: Yes    Birth control/protection: None  Other Topics Concern   Not on file  Social History Narrative   Not on file   Social Determinants of Health   Financial Resource Strain: Not on file  Food Insecurity: Not on file  Transportation Needs: Not on file  Physical Activity: Not on file  Stress: Not on file  Social Connections: Not on file  Intimate Partner Violence: Not on file   Current Outpatient Medications on File Prior to Visit  Medication Sig Dispense Refill   CREAM BASE EX Apply 1 application topically daily. BIEST Transdermal Cream 0.5 mg: Compounded bio-identical Estriol (E3) combined by [facility]     escitalopram (LEXAPRO) 20 MG tablet TAKE 1 TABLET BY MOUTH DAILY. 90 tablet 0   famotidine (PEPCID) 40 MG tablet Take 40 mg by mouth daily as needed (for heartburn/indigestion.).  2   Flaxseed, Linseed, (FLAX SEED OIL) 1000 MG CAPS Take 2,000 mg by mouth every evening.     Magnesium 500 MG CAPS Take 1,000 mg by mouth every evening.     NONFORMULARY OR COMPOUNDED ITEM Take 150 mg by mouth at bedtime. Bio identical Progesterone 150 ([facility])     Omega-3 Fatty Acids (FISH OIL) 1200 MG CAPS Take 2,400 mg by mouth every evening.     SYNTHROID 100 MCG tablet TAKE 1 TABLET DAILY BEFORE BREAKFAST 90 tablet 1    traZODone (DESYREL) 50 MG tablet Take 1 tablet (50 mg total) by mouth at bedtime. 90 tablet 0   TURMERIC PO Take by mouth daily.     Vitamin D, Ergocalciferol, (DRISDOL) 1.25 MG (50000 UNIT) CAPS capsule TAKE 1 CAPSULE (50,000 UNITS TOTAL) BY MOUTH EVERY 7 DAYS. 4 capsule 3   No current facility-administered medications on file prior to visit.   Allergies  Allergen Reactions   Penicillins Hives and Itching    Has patient had a PCN reaction causing immediate rash, facial/tongue/throat swelling, SOB or lightheadedness with hypotension: No Has patient had a PCN reaction causing severe rash involving mucus membranes or skin necrosis: No Has patient had a PCN reaction that required hospitalization: No Has patient had a PCN reaction occurring within the last 10 years: No If all of the above answers are "NO", then may proceed with Cephalosporin use.    Sulfa Antibiotics Hives and Itching    Bactrim   Family History  Problem Relation Age of Onset   Migraines Other        Fam Hx    Heart disease Other        Fam Hx   Thyroid disease Other        Fam Hx - She also has thyroid disease   Diabetes Other        Fam Hx   Arthritis Other        Fam Hx   Gallbladder disease Other        Fam Hx   Cancer Other        Fam Hx - Pancreatic   Hypertension Other        Fam Hx   Heart attack Cousin        Multiple cousins with MI's before 67 years of age   Heart disease Mother 32   Hyperlipidemia Mother 70   Hypertension Mother 86   Stroke Mother 38   Heart disease Maternal Grandmother    Cancer Father 21       pancreatic   Diabetes Brother 43  Heart attack Maternal Uncle    Heart attack Paternal Aunt    PE: BP (!) 142/90 (BP Location: Right Arm, Patient Position: Sitting, Cuff Size: Normal)   Pulse 69   Resp 20   Ht 5\' 2"  (1.575 m)   Wt 171 lb 3.2 oz (77.7 kg)   SpO2 99%   BMI 31.31 kg/m  Wt Readings from Last 10 Encounters:  10/09/22 171 lb 3.2 oz (77.7 kg)  09/12/22 172 lb (78  kg)  08/19/22 175 lb (79.4 kg)  08/18/22 173 lb (78.5 kg)  08/13/22 173 lb (78.5 kg)  06/24/22 177 lb 1.9 oz (80.3 kg)  06/12/22 179 lb 9.6 oz (81.5 kg)  10/31/21 188 lb 6.4 oz (85.5 kg)  10/08/21 192 lb 12.8 oz (87.5 kg)  05/09/21 194 lb (88 kg)   Constitutional: overweight, in NAD Eyes: PERRLA, EOMI, no exophthalmos ENT: moist mucous membranes, no thyromegaly, no cervical lymphadenopathy Cardiovascular: RRR, No MRG Respiratory: CTA B Musculoskeletal: no deformities, strength intact in all 4 Skin: moist, warm, no rashes Neurological: no tremor with outstretched hands, DTR normal in all 4  ASSESSMENT: 1.  Acquired hypothyroidism -No clear evidence of Hashimoto's thyroiditis  2.  Mild prediabetes  PLAN:  1. Patient with longstanding hypothyroidism, on Synthroid d.a.w. - latest thyroid labs reviewed with pt. >> normal: Lab Results  Component Value Date   TSH 1.290 07/01/2022  - she continues on Synthroid d.a.w. 100 mcg daily, dose decreased at last visit - pt feels good on this dose, except for fatigue, which we discussed that it could be associated with her significant weight loss. - we discussed about taking the thyroid hormone every day, with water, >30 minutes before breakfast, separated by >4 hours from acid reflux medications, calcium, iron, multivitamins. Pt. is taking it correctly. - will check thyroid tests today: TSH and fT4 - If labs are abnormal, she will need to return for repeat TFTs in 1.5 months - OTW, I will see her back in 1 year  2.  Mild prediabetes -Managed by PCP -HbA1c was 5.6% in 06/2022, normal -I do not feel she needs to start checking her blood sugars at home quite now -We did discuss in the past about a referral to nutrition but she wanted to hold off.   -No increased urination, thirst, blurry vision, but she she lost ~20 pounds since last visit.  She mentions that this was intentional, on the [employer].  Refills per [facility]. 90 days  supply - even if changing the dose.  Component     Latest Ref Rng 10/09/2022  T4,Free(Direct)     0.60 - 1.60 ng/dL 7.25   TSH     3.66 - 4.40 uIU/mL 1.42   Thyroid tests are normal.  Carlus Pavlov, MD PhD [facility] Endocrinology

## 2022-11-13 DIAGNOSIS — R350 Frequency of micturition: Secondary | ICD-10-CM | POA: Diagnosis not present

## 2022-11-20 DIAGNOSIS — N1370 Vesicoureteral-reflux, unspecified: Secondary | ICD-10-CM | POA: Diagnosis not present

## 2022-11-24 ENCOUNTER — Other Ambulatory Visit: Payer: Self-pay | Admitting: Rheumatology

## 2022-11-24 ENCOUNTER — Other Ambulatory Visit: Payer: Self-pay | Admitting: Family Medicine

## 2022-11-24 DIAGNOSIS — F5104 Psychophysiologic insomnia: Secondary | ICD-10-CM

## 2022-11-25 DIAGNOSIS — M25539 Pain in unspecified wrist: Secondary | ICD-10-CM | POA: Diagnosis not present

## 2022-11-25 DIAGNOSIS — M79642 Pain in left hand: Secondary | ICD-10-CM | POA: Diagnosis not present

## 2022-11-25 DIAGNOSIS — M25552 Pain in left hip: Secondary | ICD-10-CM | POA: Diagnosis not present

## 2022-11-25 DIAGNOSIS — M1990 Unspecified osteoarthritis, unspecified site: Secondary | ICD-10-CM | POA: Diagnosis not present

## 2022-11-25 DIAGNOSIS — M779 Enthesopathy, unspecified: Secondary | ICD-10-CM | POA: Diagnosis not present

## 2022-11-25 DIAGNOSIS — M549 Dorsalgia, unspecified: Secondary | ICD-10-CM | POA: Diagnosis not present

## 2022-11-25 DIAGNOSIS — M25551 Pain in right hip: Secondary | ICD-10-CM | POA: Diagnosis not present

## 2022-11-25 DIAGNOSIS — M1180 Other specified crystal arthropathies, unspecified site: Secondary | ICD-10-CM | POA: Diagnosis not present

## 2022-11-25 DIAGNOSIS — M25559 Pain in unspecified hip: Secondary | ICD-10-CM | POA: Diagnosis not present

## 2022-11-25 DIAGNOSIS — M25439 Effusion, unspecified wrist: Secondary | ICD-10-CM | POA: Diagnosis not present

## 2022-11-25 DIAGNOSIS — M79671 Pain in right foot: Secondary | ICD-10-CM | POA: Diagnosis not present

## 2022-11-25 DIAGNOSIS — M79672 Pain in left foot: Secondary | ICD-10-CM | POA: Diagnosis not present

## 2022-11-25 DIAGNOSIS — M79641 Pain in right hand: Secondary | ICD-10-CM | POA: Diagnosis not present

## 2022-11-25 DIAGNOSIS — M2550 Pain in unspecified joint: Secondary | ICD-10-CM | POA: Diagnosis not present

## 2022-12-01 ENCOUNTER — Ambulatory Visit: Payer: BC Managed Care – PPO | Admitting: Neurology

## 2022-12-02 ENCOUNTER — Other Ambulatory Visit: Payer: Self-pay | Admitting: Family Medicine

## 2022-12-02 DIAGNOSIS — E559 Vitamin D deficiency, unspecified: Secondary | ICD-10-CM

## 2022-12-09 ENCOUNTER — Other Ambulatory Visit: Payer: Self-pay | Admitting: Family Medicine

## 2022-12-09 DIAGNOSIS — F5104 Psychophysiologic insomnia: Secondary | ICD-10-CM

## 2022-12-15 ENCOUNTER — Other Ambulatory Visit: Payer: Self-pay | Admitting: Family Medicine

## 2022-12-15 DIAGNOSIS — F4323 Adjustment disorder with mixed anxiety and depressed mood: Secondary | ICD-10-CM

## 2022-12-24 ENCOUNTER — Ambulatory Visit: Payer: Medicare HMO | Admitting: Family Medicine

## 2022-12-25 DIAGNOSIS — M25559 Pain in unspecified hip: Secondary | ICD-10-CM | POA: Diagnosis not present

## 2022-12-25 DIAGNOSIS — M779 Enthesopathy, unspecified: Secondary | ICD-10-CM | POA: Diagnosis not present

## 2022-12-25 DIAGNOSIS — M25439 Effusion, unspecified wrist: Secondary | ICD-10-CM | POA: Diagnosis not present

## 2022-12-25 DIAGNOSIS — M1180 Other specified crystal arthropathies, unspecified site: Secondary | ICD-10-CM | POA: Diagnosis not present

## 2022-12-25 DIAGNOSIS — M25539 Pain in unspecified wrist: Secondary | ICD-10-CM | POA: Diagnosis not present

## 2022-12-25 DIAGNOSIS — M1990 Unspecified osteoarthritis, unspecified site: Secondary | ICD-10-CM | POA: Diagnosis not present

## 2022-12-25 DIAGNOSIS — M549 Dorsalgia, unspecified: Secondary | ICD-10-CM | POA: Diagnosis not present

## 2022-12-25 DIAGNOSIS — M2550 Pain in unspecified joint: Secondary | ICD-10-CM | POA: Diagnosis not present

## 2023-02-05 NOTE — Progress Notes (Deleted)
 Office Visit Note  Patient: Marissa Larson             Date of Birth: Apr 04, 1955           MRN: [id number]             PCP: Sandre Kitty, MD Referring: Sandre Kitty, MD Visit Date: 02/19/2023 Occupation: @GUAROCC @  Subjective:  No chief complaint on file.   History of Present Illness: Marissa Larson is a 68 y.o. female ***     Activities of Daily Living:  Patient reports morning stiffness for *** {minute/hour:19697}.   Patient {ACTIONS;DENIES/REPORTS:21021675::"Denies"} nocturnal pain.  Difficulty dressing/grooming: {ACTIONS;DENIES/REPORTS:21021675::"Denies"} Difficulty climbing stairs: {ACTIONS;DENIES/REPORTS:21021675::"Denies"} Difficulty getting out of chair: {ACTIONS;DENIES/REPORTS:21021675::"Denies"} Difficulty using hands for taps, buttons, cutlery, and/or writing: {ACTIONS;DENIES/REPORTS:21021675::"Denies"}  No Rheumatology ROS completed.   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   Obesity, Class I, BMI 30-34.9 12/03/2016   Nausea 09/12/2016   High risk medication use 07/30/2016   NAFLD (nonalcoholic fatty liver disease) 40/98/1191   Primary osteoarthritis of both feet 04/12/2016   History of kidney stones 04/12/2016   Primary osteoarthritis of both hands 04/11/2016   Bilateral primary osteoarthritis of hip 04/11/2016   Primary osteoarthritis of both knees 04/11/2016   Spondylosis of lumbar region without myelopathy or radiculopathy 04/11/2016    Hyperuricemia 04/11/2016   Primary insomnia 04/11/2016   Flank pain 04/02/2016   Elevated HDL 11/11/2015   Elevated LFTs 11/11/2015   Adjustment disorder with mixed anxiety and depressed mood 11/11/2015   Hypothyroidism 10/14/2015   Pseudogout involving multiple joints 10/14/2015   Vitamin D deficiency 10/14/2015   Hormone replacement therapy- per GYN 10/14/2015   HTN (hypertension) 10/14/2015   Fatigue 10/14/2015   h/o Hiatal hernia 10/14/2015   Sleep difficulties 10/14/2015   Generalized OA 10/14/2015   Counseling on health promotion and disease prevention 10/14/2015   Gastric ulcer- due to Mobic 10/09/2015   Age-related nuclear cataract of both eyes 01/25/2015   Posterior vitreous detachment of left eye 01/25/2015   chronic Palpitations 07/19/2013    Past Medical History:  Diagnosis Date   Arthritis    Chest pain 06/2013   Left sided   Chronic leg pain 06/2013   Complication of anesthesia    Dysrhythmia    Fatigue    GERD (gastroesophageal reflux disease)    History of kidney stones    Hypertension    Hypothyroidism    Kidney stones 09/12/2016   Nonproductive cough 06/2013   Overweight 01/18/2014   Peripheral neuropathy    PONV (postoperative nausea and vomiting)    Sleep difficulties    Change in sleep patterns   SOB (shortness of breath) 06/2013   Tachycardia 06/2013   Thyroid disease    hypo   UTI (lower urinary tract infection)    Vesico-ureteric reflux     Family History  Problem Relation Age of Onset   Migraines Other        Fam Hx    Heart disease Other  Fam Hx   Thyroid disease Other        Fam Hx - She also has thyroid disease   Diabetes Other        Fam Hx   Arthritis Other        Fam Hx   Gallbladder disease Other        Fam Hx   Cancer Other        Fam Hx - Pancreatic   Hypertension Other        Fam Hx   Heart attack Cousin        Multiple cousins with MI's before 39 years of age   Heart disease Mother 46   Hyperlipidemia  Mother 80   Hypertension Mother 7   Stroke Mother 44   Heart disease Maternal Grandmother    Cancer Father 78       pancreatic   Diabetes Brother 50   Heart attack Maternal Uncle    Heart attack Paternal Aunt    Past Surgical History:  Procedure Laterality Date   ABDOMINAL SURGERY  1994   CYSTOSCOPY/URETEROSCOPY/HOLMIUM LASER/STENT PLACEMENT Left 09/22/2017   Procedure: CYSTOSCOPY/RETROGRADE/URETEROSCOPY/ BASKET STONE EXTRACTION/STENT PLACEMENT;  Surgeon: Rene Paci, MD;  Location: [facility];  Service: Urology;  Laterality: Left;  ONLY NEEDS 60 MIN   FOOT SURGERY Right 2009   KIDNEY SURGERY Left 1993   KNEE ARTHROPLASTY Right 11/02/2019   KNEE SURGERY     Multiple knee surgeries   OOPHORECTOMY Left 1994   REPLACEMENT TOTAL KNEE Left 10/13/2018   Dr. Thurston Hole   small fiber neuropathy biopsy  12/2017   TONSILLECTOMY     URETEROSCOPY VIA URETEROSTOMY Left 1994   Social History   Social History Narrative   Not on file   Immunization History  Administered Date(s) Administered   Tdap 01/07/2011     Objective: Vital Signs: There were no vitals taken for this visit.   Physical Exam   Musculoskeletal Exam: ***  CDAI Exam: CDAI Score: -- Patient Global: --; Provider Global: -- Swollen: --; Tender: -- Joint Exam 02/19/2023   No joint exam has been documented for this visit   There is currently no information documented on the homunculus. Go to the Rheumatology activity and complete the homunculus joint exam.  Investigation: No additional findings.  Imaging: No results found.  Recent Labs: Lab Results  Component Value Date   WBC 8.4 06/12/2022   HGB 14.5 06/12/2022   PLT 247 06/12/2022   NA 142 06/12/2022   K 4.3 06/12/2022   CL 104 06/12/2022   CO2 28 06/12/2022   GLUCOSE 104 (H) 06/12/2022   BUN 20 06/12/2022   CREATININE 0.68 06/12/2022   BILITOT 0.4 06/12/2022   ALKPHOS 103 10/31/2021   AST 18 06/12/2022   ALT 14 06/12/2022   PROT 6.9  06/12/2022   ALBUMIN 4.9 10/31/2021   CALCIUM 9.6 06/12/2022   GFRAA 108 08/17/2019   QFTBGOLDPLUS NEGATIVE 02/16/2017    Speciality Comments: PLQ eye exam: 07/02/2022 normal Sigmund Emily Filbert - [facility] Follow up in 1 year.  Procedures:  No procedures performed Allergies: Penicillins and Sulfa antibiotics   Assessment / Plan:     Visit Diagnoses: Inflammatory arthritis  Pseudogout involving multiple joints  Primary osteoarthritis of both hands  Bilateral primary osteoarthritis of hip  S/P total knee arthroplasty, right  S/P total knee replacement, left  Primary osteoarthritis of both feet  Spondylosis of lumbar region without myelopathy or radiculopathy  Hyperuricemia  Osteopenia of multiple sites  Small fiber neuropathy  NAFLD (nonalcoholic fatty liver disease)  History of kidney stones  Vitamin D deficiency  Essential hypertension  Primary insomnia  h/o Hiatal hernia  Orders: No orders of the defined types were placed in this encounter.  No orders of the defined types were placed in this encounter.   Face-to-face time spent with patient was *** minutes. Greater than 50% of time was spent in counseling and coordination of care.  Follow-Up Instructions: No follow-ups on file.   Gearldine Bienenstock, PA-C  Note - This record has been created using Dragon software.  Chart creation errors have been sought, but may not always  have been located. Such creation errors do not reflect on  the standard of medical care.

## 2023-02-19 ENCOUNTER — Ambulatory Visit: Payer: Medicare HMO | Admitting: Physician Assistant

## 2023-02-19 DIAGNOSIS — F5101 Primary insomnia: Secondary | ICD-10-CM

## 2023-02-19 DIAGNOSIS — M160 Bilateral primary osteoarthritis of hip: Secondary | ICD-10-CM

## 2023-02-19 DIAGNOSIS — M1189 Other specified crystal arthropathies, multiple sites: Secondary | ICD-10-CM

## 2023-02-19 DIAGNOSIS — M19041 Primary osteoarthritis, right hand: Secondary | ICD-10-CM

## 2023-02-19 DIAGNOSIS — K760 Fatty (change of) liver, not elsewhere classified: Secondary | ICD-10-CM

## 2023-02-19 DIAGNOSIS — Z96651 Presence of right artificial knee joint: Secondary | ICD-10-CM

## 2023-02-19 DIAGNOSIS — E790 Hyperuricemia without signs of inflammatory arthritis and tophaceous disease: Secondary | ICD-10-CM

## 2023-02-19 DIAGNOSIS — M1990 Unspecified osteoarthritis, unspecified site: Secondary | ICD-10-CM

## 2023-02-19 DIAGNOSIS — G629 Polyneuropathy, unspecified: Secondary | ICD-10-CM

## 2023-02-19 DIAGNOSIS — K449 Diaphragmatic hernia without obstruction or gangrene: Secondary | ICD-10-CM

## 2023-02-19 DIAGNOSIS — Z96652 Presence of left artificial knee joint: Secondary | ICD-10-CM

## 2023-02-19 DIAGNOSIS — Z87442 Personal history of urinary calculi: Secondary | ICD-10-CM

## 2023-02-19 DIAGNOSIS — M8589 Other specified disorders of bone density and structure, multiple sites: Secondary | ICD-10-CM

## 2023-02-19 DIAGNOSIS — E559 Vitamin D deficiency, unspecified: Secondary | ICD-10-CM

## 2023-02-19 DIAGNOSIS — I10 Essential (primary) hypertension: Secondary | ICD-10-CM

## 2023-02-19 DIAGNOSIS — M47816 Spondylosis without myelopathy or radiculopathy, lumbar region: Secondary | ICD-10-CM

## 2023-02-19 DIAGNOSIS — M19071 Primary osteoarthritis, right ankle and foot: Secondary | ICD-10-CM

## 2023-03-02 ENCOUNTER — Other Ambulatory Visit: Payer: Self-pay | Admitting: Family Medicine

## 2023-03-02 ENCOUNTER — Encounter: Payer: Self-pay | Admitting: Family Medicine

## 2023-03-02 DIAGNOSIS — F5104 Psychophysiologic insomnia: Secondary | ICD-10-CM

## 2023-03-02 MED ORDER — TRAZODONE HCL 50 MG PO TABS: 75.0000 mg | ORAL_TABLET | Freq: Every day | ORAL | 1 refills | Status: DC

## 2023-03-10 DIAGNOSIS — K760 Fatty (change of) liver, not elsewhere classified: Secondary | ICD-10-CM | POA: Diagnosis not present

## 2023-03-10 DIAGNOSIS — K219 Gastro-esophageal reflux disease without esophagitis: Secondary | ICD-10-CM | POA: Diagnosis not present

## 2023-03-11 ENCOUNTER — Telehealth: Payer: Self-pay | Admitting: Neurology

## 2023-03-11 NOTE — Telephone Encounter (Signed)
 Cancelling appointment due to issue has gone away.  Issue was related to medication and has been resolved.

## 2023-03-13 ENCOUNTER — Other Ambulatory Visit: Payer: Self-pay | Admitting: Family Medicine

## 2023-03-13 DIAGNOSIS — F4323 Adjustment disorder with mixed anxiety and depressed mood: Secondary | ICD-10-CM

## 2023-03-16 ENCOUNTER — Telehealth: Payer: Self-pay

## 2023-03-16 NOTE — Telephone Encounter (Signed)
 LVM for pt to return call to schedule appt prior to June 2025 to continue getting refills

## 2023-03-17 ENCOUNTER — Ambulatory Visit: Payer: BC Managed Care – PPO | Admitting: Neurology

## 2023-03-18 ENCOUNTER — Other Ambulatory Visit: Payer: Self-pay | Admitting: Family Medicine

## 2023-03-18 DIAGNOSIS — E559 Vitamin D deficiency, unspecified: Secondary | ICD-10-CM

## 2023-03-19 DIAGNOSIS — K760 Fatty (change of) liver, not elsewhere classified: Secondary | ICD-10-CM | POA: Diagnosis not present

## 2023-03-19 DIAGNOSIS — K219 Gastro-esophageal reflux disease without esophagitis: Secondary | ICD-10-CM | POA: Diagnosis not present

## 2023-06-04 ENCOUNTER — Ambulatory Visit: Payer: Self-pay

## 2023-06-04 NOTE — Telephone Encounter (Signed)
 Copied from CRM [id number]. Topic: Clinical - Red Word Triage >> Jun 04, 2023  4:23 PM Ethelle Herb L wrote: Red Word that prompted transfer to Nurse Triage: Severe fatigue - feels like she "got hit by a truck", some head congestion, having abdominal issues   Chief Complaint: Fatigue Symptoms: head congestion; abdominal Frequency: constant Pertinent Negatives: Patient denies fever Disposition: [] ED /[x] Urgent Care (no appt availability in office) / [] Appointment(In office/virtual)/ []  [facility]/ [] Home Care/ [] Refused Recommended Disposition /[] [facility]/ []  Follow-up with PCP Additional Notes: Pt reports moderate fatigue x 1 week. Also endorses diarrhea and head congestion. No appts avail. RN advising urgent care within next 24 hours, patient is agreeable.   Reason for Disposition  [1] MODERATE weakness (i.e., interferes with work, school, normal activities) AND [2] persists > 3 days  Answer Assessment - Initial Assessment Questions 1. DESCRIPTION: "Describe how you are feeling."     Low energy, feels weak. Very fatigue   2. SEVERITY: "How bad is it?"  "Can you stand and walk?"   - MILD (0-3): Feels weak or tired, but does not interfere with work, school or normal activities.   - MODERATE (4-7): Able to stand and walk; weakness interferes with work, school, or normal activities.   - SEVERE (8-10): Unable to stand or walk; unable to do usual activities.     Moderate- able to walk and stand, feels exhausted doing so  3. ONSET: "When did these symptoms begin?" (e.g., hours, days, weeks, months)     "A little over a week"  4. CAUSE: "What do you think is causing the weakness or fatigue?" (e.g., not drinking enough fluids, medical problem, trouble sleeping)     Does have trouble sleeping, but takes Trazadone.   5. NEW MEDICINES:  "Have you started on any new medicines recently?" (e.g., opioid pain medicines, benzodiazepines, muscle relaxants, antidepressants,  antihistamines, neuroleptics, beta blockers)     No  6. OTHER SYMPTOMS: "Do you have any other symptoms?" (e.g., chest pain, fever, cough, SOB, vomiting, diarrhea, bleeding, other areas of pain)     Diarrhea, head congestion, felt "flushed" yesterday  7. PREGNANCY: "Is there any chance you are pregnant?" "When was your last menstrual period?"     No  Protocols used: Weakness (Generalized) and Fatigue-A-AH

## 2023-06-11 ENCOUNTER — Other Ambulatory Visit: Payer: Self-pay | Admitting: Family Medicine

## 2023-06-11 DIAGNOSIS — F4323 Adjustment disorder with mixed anxiety and depressed mood: Secondary | ICD-10-CM

## 2023-06-11 NOTE — Telephone Encounter (Signed)
 LVM and sent Mychart message about the medication request and needing an appt

## 2023-06-19 ENCOUNTER — Ambulatory Visit (INDEPENDENT_AMBULATORY_CARE_PROVIDER_SITE_OTHER): Admitting: Family Medicine

## 2023-06-19 ENCOUNTER — Encounter: Payer: Self-pay | Admitting: Family Medicine

## 2023-06-19 VITALS — BP 138/89 | HR 71 | Ht 62.0 in | Wt 174.8 lb

## 2023-06-19 DIAGNOSIS — R5383 Other fatigue: Secondary | ICD-10-CM | POA: Diagnosis not present

## 2023-06-19 DIAGNOSIS — G479 Sleep disorder, unspecified: Secondary | ICD-10-CM

## 2023-06-19 DIAGNOSIS — E559 Vitamin D deficiency, unspecified: Secondary | ICD-10-CM | POA: Diagnosis not present

## 2023-06-19 DIAGNOSIS — R7303 Prediabetes: Secondary | ICD-10-CM

## 2023-06-19 DIAGNOSIS — F4323 Adjustment disorder with mixed anxiety and depressed mood: Secondary | ICD-10-CM

## 2023-06-19 NOTE — Progress Notes (Unsigned)
   Established Patient Office Visit  Subjective   Patient ID: Marissa Larson, female    DOB: [birth year]  Age: 68 y.o. MRN: [id number]  Chief Complaint  Patient presents with   Medical Management of Chronic Issues    Med refill    HPI  Subjective - Fatigue for over a month, worsening - Sore throat - Congestion - Exhaustion despite adequate sleep - Recent travel to [place]  - Urinary symptoms - burning, frequency - Low back pain, concerned about kidney vs back  Medications: trazodone  50mg  1 tab nightly with additional half tab as needed, levothyroxine daily, escitalopram  daily, vitamin D  weekly, famotidine twice daily, flaxseed oil daily, magnesium 1 tab nightly, compounded progesterone  nightly, omega-3 daily, turmeric daily, nitrofurantoin  (macrobid ) as needed for UTI symptoms  PMH: hypothyroidism, sleep onset insomnia, UTIs, GERD PSH: none mentioned FH: none mentioned Social Hx: lives on [place] acres with husband, maintains garden  ROS: positive for fatigue, sore throat, congestion, dysuria, frequency, low back pain; negative for sleep apnea symptoms, medication changes, dietary changes   The 10-year ASCVD risk score (Arnett DK, et al., 2019) is: 6.3%  Health Maintenance Due  Topic Date Due   Medicare Annual Wellness (AWV)  Never done   COVID-19 Vaccine (1) Never done   Zoster Vaccines- Shingrix (1 of 2) Never done   Pneumococcal Vaccine: 50+ Years (1 of 1 - PCV) Never done   DTaP/Tdap/Td (2 - Td or Tdap) 01/06/2021   MAMMOGRAM  04/19/2022      Objective:     BP 138/89   Pulse 71   Ht 5' 2 (1.575 m)   Wt 174 lb 12 oz (79.3 kg)   SpO2 96%   BMI 31.96 kg/m    Physical Exam Gen: alert, oriented Heent: perrla, eomi Cv: rrr Pulm: lctab Gi: soft, nontender Msk: strength equal b/l Psych: pleasant affect      Assessment & Plan:   Other fatigue Assessment & Plan:    - Ongoing for over a month with sore throat, congestion    - Recent travel and stress  likely contributing factors    - Last thyroid  labs October 2024 with Dr. Alban Alm    - Will check comprehensive labs including CBC, CMP, thyroid  studies, B12, iron studies    - Consider home sleep study if symptoms persist after addressing other potential causes  Orders: -     TSH -     B12 and Folate Panel -     Iron, TIBC and Ferritin Panel -     CBC with Differential/Platelet -     Comprehensive metabolic panel with GFR -     T4, free -     Urine Culture  Vitamin D  deficiency Assessment & Plan:    - discussed transitioning from weekly to daily vitamin D  supplementation to avoid potential toxicity. Checking vit  d.   Orders: -     VITAMIN D  25 Hydroxy (Vit-D Deficiency, Fractures)  Prediabetes -     Hemoglobin A1c  Adjustment disorder with mixed anxiety and depressed mood Assessment & Plan: Continue lexapro    Sleep difficulties Assessment & Plan: Continue trazodone        Return in about 3 months (around 09/19/2023) for fatigue.     Laneta Pintos, MD

## 2023-06-19 NOTE — Patient Instructions (Signed)
 It was nice to see you today,  We addressed the following topics today: -I am getting some labs and test to rule out possible causes of your fatigue. - Of all these come back normal we will consider doing a sleep study test. - I would like to see you back in 3 months just to make sure this has resolved.  Have a great day,  Etha Henle, MD

## 2023-06-20 LAB — COMPREHENSIVE METABOLIC PANEL WITH GFR
ALT: 15 IU/L (ref 0–32)
AST: 17 IU/L (ref 0–40)
Albumin: 4.5 g/dL (ref 3.9–4.9)
Alkaline Phosphatase: 108 IU/L (ref 44–121)
BUN/Creatinine Ratio: 29 — ABNORMAL HIGH (ref 12–28)
BUN: 17 mg/dL (ref 8–27)
Bilirubin Total: 0.5 mg/dL (ref 0.0–1.2)
CO2: 22 mmol/L (ref 20–29)
Calcium: 9.6 mg/dL (ref 8.7–10.3)
Chloride: 105 mmol/L (ref 96–106)
Creatinine, Ser: 0.59 mg/dL (ref 0.57–1.00)
Globulin, Total: 2.3 g/dL (ref 1.5–4.5)
Glucose: 96 mg/dL (ref 70–99)
Potassium: 4.5 mmol/L (ref 3.5–5.2)
Sodium: 143 mmol/L (ref 134–144)
Total Protein: 6.8 g/dL (ref 6.0–8.5)
eGFR: 100 mL/min/{1.73_m2} (ref >59)

## 2023-06-20 LAB — CBC WITH DIFFERENTIAL/PLATELET
Basophils Absolute: 0.1 10*3/uL (ref 0.0–0.2)
Basos: 1 %
EOS (ABSOLUTE): 0.1 10*3/uL (ref 0.0–0.4)
Eos: 2 %
Hematocrit: 45.7 % (ref 34.0–46.6)
Hemoglobin: 15.1 g/dL (ref 11.1–15.9)
Immature Grans (Abs): 0 10*3/uL (ref 0.0–0.1)
Immature Granulocytes: 0 %
Lymphocytes Absolute: 2.1 10*3/uL (ref 0.7–3.1)
Lymphs: 28 %
MCH: 32.3 pg (ref 26.6–33.0)
MCHC: 33 g/dL (ref 31.5–35.7)
MCV: 98 fL — ABNORMAL HIGH (ref 79–97)
Monocytes Absolute: 0.5 10*3/uL (ref 0.1–0.9)
Monocytes: 6 %
Neutrophils Absolute: 4.7 10*3/uL (ref 1.4–7.0)
Neutrophils: 63 %
Platelets: 242 10*3/uL (ref 150–450)
RBC: 4.68 x10E6/uL (ref 3.77–5.28)
RDW: 12.5 % (ref 11.7–15.4)
WBC: 7.5 10*3/uL (ref 3.4–10.8)

## 2023-06-20 LAB — VITAMIN D 25 HYDROXY (VIT D DEFICIENCY, FRACTURES): Vit D, 25-Hydroxy: 52.9 ng/mL (ref 30.0–100.0)

## 2023-06-20 LAB — B12 AND FOLATE PANEL
Folate: 16.4 ng/mL (ref >3.0)
Vitamin B-12: 687 pg/mL (ref 232–1245)

## 2023-06-20 LAB — HEMOGLOBIN A1C
Est. average glucose Bld gHb Est-mCnc: 105 mg/dL
Hgb A1c MFr Bld: 5.3 % (ref 4.8–5.6)

## 2023-06-20 LAB — IRON,TIBC AND FERRITIN PANEL
Ferritin: 78 ng/mL (ref 15–150)
Iron Saturation: 54 % (ref 15–55)
Iron: 180 ug/dL — ABNORMAL HIGH (ref 27–139)
Total Iron Binding Capacity: 335 ug/dL (ref 250–450)
UIBC: 155 ug/dL (ref 118–369)

## 2023-06-20 LAB — TSH: TSH: 0.487 u[IU]/mL (ref 0.450–4.500)

## 2023-06-20 LAB — T4, FREE: Free T4: 1.46 ng/dL (ref 0.82–1.77)

## 2023-06-21 NOTE — Assessment & Plan Note (Signed)
-   Ongoing for over a month with sore throat, congestion    - Recent travel and stress likely contributing factors    - Last thyroid  labs October 2024 with Dr. Alban Alm    - Will check comprehensive labs including CBC, CMP, thyroid  studies, B12, iron studies    - Consider home sleep study if symptoms persist after addressing other potential causes

## 2023-06-21 NOTE — Assessment & Plan Note (Signed)
-   discussed transitioning from weekly to daily vitamin D  supplementation to avoid potential toxicity. Checking vit  d.

## 2023-06-21 NOTE — Assessment & Plan Note (Signed)
 Continue trazodone

## 2023-06-21 NOTE — Assessment & Plan Note (Signed)
-   Continue lexapro

## 2023-06-22 ENCOUNTER — Ambulatory Visit: Payer: Self-pay | Admitting: Family Medicine

## 2023-06-22 MED ORDER — CEPHALEXIN 500 MG PO CAPS: 500.0000 mg | ORAL_CAPSULE | Freq: Two times a day (BID) | ORAL | 0 refills | Status: AC

## 2023-06-22 NOTE — Telephone Encounter (Signed)
 Pt returned call and wanted to know what GNR meant and I informed her and she also stated that she has never had elevated iron and wanted to know what your thought is on this and what should be done.  Also she said that she is in agreement to having a home sleep study ordered. Please advise.

## 2023-06-23 ENCOUNTER — Other Ambulatory Visit: Payer: Self-pay | Admitting: Family Medicine

## 2023-06-23 DIAGNOSIS — R5383 Other fatigue: Secondary | ICD-10-CM

## 2023-06-23 LAB — URINE CULTURE

## 2023-06-23 NOTE — Telephone Encounter (Signed)
 I have sent the patient a mychart message regarding this.

## 2023-07-27 ENCOUNTER — Encounter: Payer: Self-pay | Admitting: Internal Medicine

## 2023-07-31 DIAGNOSIS — Z0100 Encounter for examination of eyes and vision without abnormal findings: Secondary | ICD-10-CM | POA: Diagnosis not present

## 2023-07-31 DIAGNOSIS — H5203 Hypermetropia, bilateral: Secondary | ICD-10-CM | POA: Diagnosis not present

## 2023-08-04 ENCOUNTER — Ambulatory Visit: Payer: Self-pay

## 2023-08-04 ENCOUNTER — Other Ambulatory Visit: Payer: Self-pay | Admitting: Family Medicine

## 2023-08-04 DIAGNOSIS — E559 Vitamin D deficiency, unspecified: Secondary | ICD-10-CM

## 2023-08-04 NOTE — Telephone Encounter (Signed)
 FYI Only or Action Required?: Action required by provider: Patient would like to know if there are any medications or lab tests that could be ordered for her symptoms.  Patient was last seen in primary care on 06/19/2023 by Chandra Toribio POUR, MD.  Called Nurse Triage reporting Joint Pain.  Symptoms began a week ago.  Interventions attempted: OTC medications: Ibuprofen and Rest, hydration, or home remedies.  Symptoms are: unchanged.  Triage Disposition: See PCP When Office is Open (Within 3 Days)  Patient/caregiver understands and will follow disposition?: No, wishes to speak with PCP   No appointment's available, advised to go to urgent care for worsening symptoms.    Recently had Joint pain, headache, and, fatigue. Felt as if she had the flu. Currently has diarrhea  Callback #: [id number]  Pharmacy: [facility] - [place], [place] - [address] Marissa L[place] [address] Phone: [phone] Fax: [phone] Hours: Not open 24 hours       Reason for Disposition  [1] MODERATE pain (e.g., interferes with normal activities) AND [2] present > 3 days  Answer Assessment - Initial Assessment Questions No appointments available. Patient would like to know if anything can be prescribed for her symptoms or if there are any labs that he would like to order. Please advise.      1. ONSET: When did the muscle aches or body pains start?      6 days ago 2. LOCATION: What part of your body is hurting? (e.g., entire body, arms, legs)      Diffuse pain to joints  3. SEVERITY: How bad is the pain? (Scale 1-10; or mild, moderate, severe)     5/10 4. CAUSE: What do you think is causing the pains?     Unsure  5. FEVER: Do you have a fever? If Yes, ask: What is your temperature, how was it measured, and  when did it start?      No 6. OTHER SYMPTOMS: Do you have any other symptoms? (e.g., chest pain, cold or flu symptoms, rash, weakness, weight  loss)     Diarrhea, fatigue, headache  Protocols used: Muscle Aches and Body Pain-A-AH

## 2023-08-05 ENCOUNTER — Telehealth: Payer: Self-pay | Admitting: *Deleted

## 2023-08-05 NOTE — Telephone Encounter (Signed)
 Patient contacted the office and left message stating she is having a bad flare and would like to be seen this week.  Attempted to contact the patient and left message to advise patient we have had an appointment open up for [facility].   Patient returned call to the office and accepted the appointment in the office for tomorrow.

## 2023-08-06 ENCOUNTER — Encounter: Payer: Self-pay | Admitting: Physician Assistant

## 2023-08-06 ENCOUNTER — Ambulatory Visit: Attending: Physician Assistant | Admitting: Physician Assistant

## 2023-08-06 VITALS — BP 154/83 | HR 76 | Resp 16 | Ht 66.0 in | Wt 175.6 lb

## 2023-08-06 DIAGNOSIS — M1189 Other specified crystal arthropathies, multiple sites: Secondary | ICD-10-CM

## 2023-08-06 DIAGNOSIS — M8589 Other specified disorders of bone density and structure, multiple sites: Secondary | ICD-10-CM

## 2023-08-06 DIAGNOSIS — M19071 Primary osteoarthritis, right ankle and foot: Secondary | ICD-10-CM | POA: Diagnosis not present

## 2023-08-06 DIAGNOSIS — G629 Polyneuropathy, unspecified: Secondary | ICD-10-CM

## 2023-08-06 DIAGNOSIS — M160 Bilateral primary osteoarthritis of hip: Secondary | ICD-10-CM | POA: Diagnosis not present

## 2023-08-06 DIAGNOSIS — M19042 Primary osteoarthritis, left hand: Secondary | ICD-10-CM

## 2023-08-06 DIAGNOSIS — E559 Vitamin D deficiency, unspecified: Secondary | ICD-10-CM | POA: Diagnosis not present

## 2023-08-06 DIAGNOSIS — Z96652 Presence of left artificial knee joint: Secondary | ICD-10-CM

## 2023-08-06 DIAGNOSIS — F5101 Primary insomnia: Secondary | ICD-10-CM

## 2023-08-06 DIAGNOSIS — E790 Hyperuricemia without signs of inflammatory arthritis and tophaceous disease: Secondary | ICD-10-CM

## 2023-08-06 DIAGNOSIS — M19041 Primary osteoarthritis, right hand: Secondary | ICD-10-CM

## 2023-08-06 DIAGNOSIS — M1990 Unspecified osteoarthritis, unspecified site: Secondary | ICD-10-CM | POA: Diagnosis not present

## 2023-08-06 DIAGNOSIS — Z96651 Presence of right artificial knee joint: Secondary | ICD-10-CM

## 2023-08-06 DIAGNOSIS — K760 Fatty (change of) liver, not elsewhere classified: Secondary | ICD-10-CM

## 2023-08-06 DIAGNOSIS — I10 Essential (primary) hypertension: Secondary | ICD-10-CM

## 2023-08-06 DIAGNOSIS — M47816 Spondylosis without myelopathy or radiculopathy, lumbar region: Secondary | ICD-10-CM

## 2023-08-06 DIAGNOSIS — Z87442 Personal history of urinary calculi: Secondary | ICD-10-CM

## 2023-08-06 DIAGNOSIS — M19072 Primary osteoarthritis, left ankle and foot: Secondary | ICD-10-CM

## 2023-08-06 DIAGNOSIS — K449 Diaphragmatic hernia without obstruction or gangrene: Secondary | ICD-10-CM

## 2023-08-06 MED ORDER — PREDNISONE 5 MG PO TABS
ORAL_TABLET | ORAL | 0 refills | Status: DC
Start: 2023-08-06 — End: 2023-09-16

## 2023-08-06 NOTE — Progress Notes (Signed)
 Office Visit Note  Patient: Marissa Larson             Date of Birth: [birth year]           MRN: [id number]             PCP: Chandra Toribio POUR, MD Referring: Chandra Toribio POUR, MD Visit Date: 08/06/2023 Occupation: @GUAROCC @  Subjective:  Recent flare  History of Present Illness: Marissa Larson is a 68 y.o. female with history of inflammatory arthritis and pseudogout.   Patient reports that she started to have a flare involving multiple joints on 07/29/2023.  She denies any identifiable trigger for the flare.  Patient states that she was having pain in every joint of her body as well as limited mobility and difficulty performing ADLs.  According to the patient her husband had to assist her due to the severity of symptoms.  Patient states that she was also experiencing profound fatigue to the point that she stopped almost 3 days straight.  She states that her symptoms have gradually started to improve but she continues to have some pain in both wrist and her lower back and hips.  She has been taking ibuprofen for symptomatic relief.    Activities of Daily Living:  Patient reports joint stiffness lasting all day  Patient Reports nocturnal pain.  Difficulty dressing/grooming: Reports Difficulty climbing stairs: Reports Difficulty getting out of chair: Reports Difficulty using hands for taps, buttons, cutlery, and/or writing: Reports  Review of Systems  Constitutional:  Positive for fatigue.  HENT:  Negative for mouth sores and mouth dryness.   Eyes:  Negative for dryness.  Respiratory:  Negative for shortness of breath.   Cardiovascular:  Negative for chest pain and palpitations.  Gastrointestinal:  Positive for diarrhea. Negative for blood in stool and constipation.  Endocrine: Positive for increased urination.  Genitourinary:  Positive for involuntary urination.  Musculoskeletal:  Positive for joint pain, joint pain, joint swelling, myalgias, morning stiffness and myalgias. Negative for  gait problem, muscle weakness and muscle tenderness.  Skin:  Negative for color change, rash, hair loss and sensitivity to sunlight.  Allergic/Immunologic: Positive for susceptible to infections.  Neurological:  Positive for headaches. Negative for dizziness.  Hematological:  Positive for swollen glands.  Psychiatric/Behavioral:  Positive for sleep disturbance. Negative for depressed mood. The patient is not nervous/anxious.     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   Obesity, Class I, BMI 30-34.9 12/03/2016   Nausea 09/12/2016   High risk medication use 07/30/2016   NAFLD (nonalcoholic fatty liver disease) 92/74/7981   Primary osteoarthritis of both feet 04/12/2016   History of kidney stones 04/12/2016   Primary osteoarthritis of both hands 04/11/2016   Bilateral primary osteoarthritis of hip 04/11/2016   Primary osteoarthritis of both knees 04/11/2016   Spondylosis of lumbar region without myelopathy or radiculopathy 04/11/2016   Hyperuricemia 04/11/2016   Primary insomnia 04/11/2016   Flank pain 04/02/2016   Elevated HDL 11/11/2015   Elevated LFTs 11/11/2015  Adjustment disorder with mixed anxiety and depressed mood 11/11/2015   Hypothyroidism 10/14/2015   Pseudogout involving multiple joints 10/14/2015   Vitamin D  deficiency 10/14/2015   Hormone replacement therapy- per GYN 10/14/2015   HTN (hypertension) 10/14/2015   Fatigue 10/14/2015   h/o Hiatal hernia 10/14/2015    Sleep difficulties 10/14/2015   Generalized OA 10/14/2015   Counseling on health promotion and disease prevention 10/14/2015   Gastric ulcer- due to Mobic  10/09/2015   Age-related nuclear cataract of both eyes 01/25/2015   Posterior vitreous detachment of left eye 01/25/2015   chronic Palpitations 07/19/2013    Past Medical History:  Diagnosis Date   Arthritis    Chest pain 06/2013   Left sided   Chronic leg pain 06/2013   Complication of anesthesia    Dysrhythmia    Fatigue    GERD (gastroesophageal reflux disease)    History of kidney stones    Hypertension    Hypothyroidism    Kidney stones 09/12/2016   Nonproductive cough 06/2013   Overweight 01/18/2014   Peripheral neuropathy    PONV (postoperative nausea and vomiting)    Sleep difficulties    Change in sleep patterns   SOB (shortness of breath) 06/2013   Tachycardia 06/2013   Thyroid  disease    hypo   UTI (lower urinary tract infection)    Vesico-ureteric reflux     Family History  Problem Relation Age of Onset   Heart disease Mother 58   Hyperlipidemia Mother 32   Hypertension Mother 18   Stroke Mother 61   Cancer Father 86       pancreatic   Rheum arthritis Sister    Cancer Sister        Lung   Diabetes Brother 50   Heart disease Maternal Grandmother    Heart attack Maternal Uncle    Heart attack Paternal Aunt    Heart attack Cousin        Multiple cousins with MI's before 95 years of age   Migraines Other        Fam Hx    Heart disease Other        Fam Hx   Thyroid  disease Other        Fam Hx - She also has thyroid  disease   Diabetes Other        Fam Hx   Arthritis Other        Fam Hx   Gallbladder disease Other        Fam Hx   Cancer Other        Fam Hx - Pancreatic   Hypertension Other        Fam Hx   Past Surgical History:  Procedure Laterality Date   ABDOMINAL SURGERY  1994   CYSTOSCOPY/URETEROSCOPY/HOLMIUM LASER/STENT PLACEMENT Left 09/22/2017   Procedure:  CYSTOSCOPY/RETROGRADE/URETEROSCOPY/ BASKET STONE EXTRACTION/STENT PLACEMENT;  Surgeon: Devere Lonni Righter, MD;  Location: [facility];  Service: Urology;  Laterality: Left;  ONLY NEEDS 60 MIN   FOOT SURGERY Right 2009   KIDNEY SURGERY Left 1993   KNEE ARTHROPLASTY Right 11/02/2019   KNEE SURGERY     Multiple knee surgeries   OOPHORECTOMY Left 1994   REPLACEMENT TOTAL KNEE Left 10/13/2018   Dr. Jane   small fiber neuropathy biopsy  12/2017   TONSILLECTOMY     URETEROSCOPY VIA URETEROSTOMY Left 1994   Social History   Social History Narrative   Not on file   Immunization History  Administered Date(s) Administered  Tdap 01/07/2011     Objective: Vital Signs: BP (!) 154/83 (BP Location: Left Arm, Patient Position: Sitting, Cuff Size: Large)   Pulse 76   Resp 16   Ht 5' 6 (1.676 m)   Wt 175 lb 9.6 oz (79.7 kg)   BMI 28.34 kg/m    Physical Exam Vitals and nursing note reviewed.  Constitutional:      Appearance: She is well-developed.  HENT:     Head: Normocephalic and atraumatic.  Eyes:     Conjunctiva/sclera: Conjunctivae normal.  Cardiovascular:     Rate and Rhythm: Normal rate and regular rhythm.     Heart sounds: Normal heart sounds.  Pulmonary:     Effort: Pulmonary effort is normal.     Breath sounds: Normal breath sounds.  Abdominal:     General: Bowel sounds are normal.     Palpations: Abdomen is soft.  Musculoskeletal:     Cervical back: Normal range of motion.  Lymphadenopathy:     Cervical: No cervical adenopathy.  Skin:    General: Skin is warm and dry.     Capillary Refill: Capillary refill takes less than 2 seconds.  Neurological:     Mental Status: She is alert and oriented to person, place, and time.  Psychiatric:        Behavior: Behavior normal.      Musculoskeletal Exam: C-spine has limited range of motion without rotation.  Painful and limited mobility of both shoulders with abduction.  Internal rotation of both shoulders was  well-preserved.  Mild tenderness along both shoulders and bilateral elbow joint lines.  Limited range of motion of both wrist joints with tenderness and mild inflammation bilaterally.  Synovial thickening of bilateral 2nd and 3rd MCP joints but no synovitis noted.  PIP and DIP thickening consistent with osteoarthritis of both hands.  Complete fist formation bilaterally.  Painful limited range of motion of both hip joints.  Bilateral knee replacements have good range of motion.  Ankle joints have good range of motion no tenderness or synovitis.  CDAI Exam: CDAI Score: -- Patient Global: --; Provider Global: -- Swollen: --; Tender: -- Joint Exam 08/06/2023   No joint exam has been documented for this visit   There is currently no information documented on the homunculus. Go to the Rheumatology activity and complete the homunculus joint exam.  Investigation: No additional findings.  Imaging: No results found.  Recent Labs: Lab Results  Component Value Date   WBC 7.5 06/19/2023   HGB 15.1 06/19/2023   PLT 242 06/19/2023   NA 143 06/19/2023   K 4.5 06/19/2023   CL 105 06/19/2023   CO2 22 06/19/2023   GLUCOSE 96 06/19/2023   BUN 17 06/19/2023   CREATININE 0.59 06/19/2023   BILITOT 0.5 06/19/2023   ALKPHOS 108 06/19/2023   AST 17 06/19/2023   ALT 15 06/19/2023   PROT 6.8 06/19/2023   ALBUMIN 4.5 06/19/2023   CALCIUM 9.6 06/19/2023   GFRAA 108 08/17/2019   QFTBGOLDPLUS NEGATIVE 02/16/2017    Speciality Comments: PLQ eye exam: 07/02/2022 normal Arlena Reusing - [facility] Follow up in 1 year.  Procedures:  No procedures performed Allergies: Penicillins and Sulfa antibiotics  CBC and CMP updated on 06/19/23.  Assessment / Plan:     Visit Diagnoses: Inflammatory arthritis - Previous therapy: Celebrex-GI side effects, meloxicam -ulcers, colchicine -paresthesias/neuropathy, Plaquenil -inadequate response, dizziness, headaches, diarrhea: Patient presents today recovering from a flare that  started on 07/29/2023.  According to the patient she has not been able  to identify a trigger for the flare but it involves multiple joints.  She is having increased pain and stiffness in every joint in her body but most prominently in her lower back, both hips, both wrist joints.  She has been taking ibuprofen as needed for symptomatic relief.  She was having difficulty performing ADLs and had to have her husband help dress her.  She was also experiencing profound fatigue to the point that she slept for most few days straight.  Patient states that her symptoms have gradually started to subside.  She continues to have some tenderness and inflammation of both wrist joints.  Limited and painful range of motion of both hips noted.  Prednisone  taper starting at 20 mg tapering by 5 mg every 4 days was sent to the pharmacy today.  She was advised to take prednisone  in the morning with food and avoid the use of NSAIDs.  She will notify us  if she develops any signs or symptoms of recurrence.  She will follow-up in the office in 5 months or sooner if needed.  Pseudogout involving multiple joints -Discontinued colchicine  in October 2021 due to side effects.  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  She currently has an exacerbation of pain and inflammation involving both wrist joints.  A prednisone  taper sent to the pharmacy today.  Primary osteoarthritis of both hands: She has PIP and DIP thickening consistent osteoarthritis of both hands.  Synovial thickening of bilateral 2nd and 3rd MCP joints.  Inflammation and tenderness of both wrists noted.  Prednisone  taper sent to the pharmacy today.  Bilateral primary osteoarthritis of hip: She has painful limited range of motion of both hip joints.  Groin pain noted with range of motion.  A prednisone  taper sent to the pharmacy today.  S/P total knee arthroplasty, right - Dr. Jane.  During her recent flare she was having increased discomfort in both knee replacements.  No  effusion noted on exam.  S/P total knee replacement, left - surgery by Dr. Jane in 2020.  Chronic pain.  No effusion noted.  Primary osteoarthritis of both feet: She experiences intermittent discomfort in both feet.  No inflammation noted on examination today.  Spondylosis of lumbar region without myelopathy or radiculopathy: She has been experiencing exacerbation of discomfort in her lower back.  No symptoms of radiculopathy.  Other medical conditions are listed as follows:  Hyperuricemia: No signs or symptoms of a gout flare.  Osteopenia of multiple sites - April 2022 T score was -2.3 in the lumbar spine.  Patient had an updated bone density ordered by her gynecologist in summer 2024.  Vitamin D  deficiency: She takes vitamin D  50,000 units once weekly.  Small fiber neuropathy  NAFLD (nonalcoholic fatty liver disease)  History of kidney stones  Essential hypertension  Primary insomnia  h/o Hiatal hernia  Orders: No orders of the defined types were placed in this encounter.  Meds ordered this encounter  Medications   predniSONE  (DELTASONE ) 5 MG tablet    Sig: Take 4 tabs po x 4 days, 3  tabs po x 4 days, 2  tabs po x 4 days, 1  tab po x 4 days    Dispense:  40 tablet    Refill:  0     Follow-Up Instructions: Return in about 6 months (around 02/06/2024) for Inflammatory arthritis.   Waddell CHRISTELLA Craze, PA-C  Note - This record has been created using Dragon software.  Chart creation errors have been sought, but may not always  have been located. Such creation errors do not reflect on  the standard of medical care.

## 2023-08-11 DIAGNOSIS — K219 Gastro-esophageal reflux disease without esophagitis: Secondary | ICD-10-CM | POA: Diagnosis not present

## 2023-08-11 DIAGNOSIS — Z791 Long term (current) use of non-steroidal anti-inflammatories (NSAID): Secondary | ICD-10-CM | POA: Diagnosis not present

## 2023-08-11 DIAGNOSIS — R1010 Upper abdominal pain, unspecified: Secondary | ICD-10-CM | POA: Diagnosis not present

## 2023-08-11 DIAGNOSIS — K760 Fatty (change of) liver, not elsewhere classified: Secondary | ICD-10-CM | POA: Diagnosis not present

## 2023-08-11 DIAGNOSIS — R159 Full incontinence of feces: Secondary | ICD-10-CM | POA: Diagnosis not present

## 2023-08-11 DIAGNOSIS — R197 Diarrhea, unspecified: Secondary | ICD-10-CM | POA: Diagnosis not present

## 2023-08-13 ENCOUNTER — Other Ambulatory Visit: Payer: Self-pay | Admitting: Internal Medicine

## 2023-08-14 DIAGNOSIS — R1010 Upper abdominal pain, unspecified: Secondary | ICD-10-CM | POA: Diagnosis not present

## 2023-08-14 DIAGNOSIS — R197 Diarrhea, unspecified: Secondary | ICD-10-CM | POA: Diagnosis not present

## 2023-08-17 ENCOUNTER — Ambulatory Visit: Admitting: Physician Assistant

## 2023-08-19 DIAGNOSIS — N200 Calculus of kidney: Secondary | ICD-10-CM | POA: Diagnosis not present

## 2023-08-19 DIAGNOSIS — R1010 Upper abdominal pain, unspecified: Secondary | ICD-10-CM | POA: Diagnosis not present

## 2023-08-26 ENCOUNTER — Other Ambulatory Visit: Payer: Self-pay | Admitting: Family Medicine

## 2023-08-26 DIAGNOSIS — F5104 Psychophysiologic insomnia: Secondary | ICD-10-CM

## 2023-09-08 DIAGNOSIS — Z20822 Contact with and (suspected) exposure to covid-19: Secondary | ICD-10-CM | POA: Diagnosis not present

## 2023-09-08 DIAGNOSIS — R051 Acute cough: Secondary | ICD-10-CM | POA: Diagnosis not present

## 2023-09-11 ENCOUNTER — Other Ambulatory Visit: Payer: Self-pay | Admitting: Family Medicine

## 2023-09-11 DIAGNOSIS — F4323 Adjustment disorder with mixed anxiety and depressed mood: Secondary | ICD-10-CM

## 2023-09-16 ENCOUNTER — Telehealth: Payer: Self-pay | Admitting: *Deleted

## 2023-09-16 ENCOUNTER — Ambulatory Visit: Admitting: Family Medicine

## 2023-09-16 ENCOUNTER — Encounter: Payer: Self-pay | Admitting: Family Medicine

## 2023-09-16 VITALS — BP 135/70 | HR 69 | Ht 66.0 in | Wt 177.1 lb

## 2023-09-16 DIAGNOSIS — E7800 Pure hypercholesterolemia, unspecified: Secondary | ICD-10-CM | POA: Diagnosis not present

## 2023-09-16 DIAGNOSIS — R790 Abnormal level of blood mineral: Secondary | ICD-10-CM | POA: Diagnosis not present

## 2023-09-16 DIAGNOSIS — G4701 Insomnia due to medical condition: Secondary | ICD-10-CM | POA: Diagnosis not present

## 2023-09-16 DIAGNOSIS — R300 Dysuria: Secondary | ICD-10-CM

## 2023-09-16 DIAGNOSIS — M1990 Unspecified osteoarthritis, unspecified site: Secondary | ICD-10-CM

## 2023-09-16 DIAGNOSIS — R5383 Other fatigue: Secondary | ICD-10-CM

## 2023-09-16 DIAGNOSIS — G8929 Other chronic pain: Secondary | ICD-10-CM | POA: Diagnosis not present

## 2023-09-16 LAB — POCT URINALYSIS DIP (CLINITEK)
Bilirubin, UA: NEGATIVE
Glucose, UA: NEGATIVE mg/dL
Ketones, POC UA: NEGATIVE mg/dL
Leukocytes, UA: NEGATIVE — AB
Nitrite, UA: NEGATIVE
POC PROTEIN,UA: NEGATIVE
Spec Grav, UA: 1.015 (ref 1.010–1.025)
Urobilinogen, UA: 0.2 U/dL — AB
pH, UA: 7 (ref 5.0–8.0)

## 2023-09-16 MED ORDER — DOXEPIN HCL 6 MG PO TABS: 1.0000 | ORAL_TABLET | Freq: Every day | ORAL | 2 refills | Status: DC

## 2023-09-16 NOTE — Patient Instructions (Addendum)
 It was nice to see you today,  We addressed the following topics today: -I am prescribing a medication called doxepin  to help with sleep.  Take it 30 minutes to an hour before you need to go to bed. - If the price is too high or it is not covered by your insurance let me know and I can prescribe it as a different dose that will be cheaper. - I will check for UTI and then send in a culture if necessary.   Have a great day,  Rolan Slain, MD

## 2023-09-16 NOTE — Telephone Encounter (Signed)
 Copied from CRM [id number]. Topic: Clinical - Lab/Test Results >> Sep 16, 2023 10:50 AM Emylou G wrote: Reason for CRM: Patient called. To schedule labs.. can we order?  She was just seen today?  Said it was cortisol and iron

## 2023-09-16 NOTE — Telephone Encounter (Signed)
 Orders are in

## 2023-09-16 NOTE — Telephone Encounter (Signed)
 LVM to call back to schedule the lab.

## 2023-09-16 NOTE — Progress Notes (Unsigned)
 Established Patient Office Visit  Subjective   Patient ID: Marissa Larson, female    DOB: November 27, 1955  Age: 68 y.o. MRN: [id number]  Chief Complaint  Patient presents with   Medical Management of Chronic Issues    HPI  Subjective - Presents with symptoms of a urinary tract infection, starting approximately 4 days ago. Reports history of recurrent UTIs. - Reports insomnia. Describes difficulty falling asleep and staying asleep, getting about 3 hours of sleep last night. Reports significant life stressors since 2023/06/02, including a death in the family and frequent travel, which may be contributing to poor sleep. - Reports significant arthritic pain following discontinuation of meloxicam . Describes a recent severe flare of arthritis, the worst ever experienced, which was treated with a short course of prednisone .  Medications: Currently taking Lexapro . Recent medication changes include stopping meloxicam  due to significant GI side effects (diarrhea, abdominal pain). Also stopped trazodone  to trial amitriptyline , which was then stopped due to activating side effects (bouncing off the walls). A trial of Plaquenil  was also stopped due to severe headaches and instability/dysequilibrium. Reports having prednisone  on hand for arthritis flares. Was previously on daily Macrobid  for UTI prophylaxis. Last UTI in June 2024 was treated successfully with Keflex .  PMH, PSH, FH, Social Hx: PMH: Recurrent UTIs, left-sided double collecting system, vesicoureteral reflux, inflammatory arthritis, severe osteoarthritis. Follows with urology at [facility], rheumatology, and gastroenterology. Recent CT scan of the abdomen was clear. Social Hx: Reports significant stress due to a recent death in the family and increased travel to help family.  ROS: GU: Positive for symptoms consistent with UTI. MSK: Positive for significant arthritic pain. GI: History of diarrhea and abdominal pain, now resolved off meloxicam . Neuro: History of  headaches and instability on Plaquenil , now resolved. Psych: Reports insomnia and significant stress.  Objective A urine sample will be collected for analysis and culture.  Assessment and Plan Recurrent Urinary Tract Infection - History of recurrent UTIs, left-sided double collecting system, and reflux. Presents with symptoms suggestive of a UTI for 4 days. Last UTI in June 2024 was sensitive to most antibiotics except nitrofurantoin  and responded well to Keflex . - Check urinalysis today. If consistent with UTI, will send prescription for Keflex . - Send urine for culture and sensitivity to guide therapy if needed.  Insomnia, secondary to stress and pain - Reports significant difficulty with sleep onset and maintenance, exacerbated by recent life stressors and uncontrolled arthritis pain. Failed trials of trazodone  and amitriptyline . - Start doxepin  6mg  at bedtime for sleep. Counseled this may also provide a small benefit for pain and GI symptoms. Advised that the dose is fixed for sleep and it either works or does not. - Will check an 8 AM cortisol level and iron panel via lab visit. Patient to be fasting. - Schedule a follow-up video visit in one month to assess sleep and medication efficacy.  Inflammatory Arthritis / Severe Osteoarthritis - History of inflammatory arthritis and severe osteoarthritis. Recently stopped meloxicam  due to intolerable GI side effects, which precipitated a severe pain flare. The flare was managed with a short course of prednisone  prescribed by her rheumatologist. Continues to have significant arthritic pain. - Continue management with rheumatology. - Doxepin  may offer minimal adjunctive pain relief.    The 10-year ASCVD risk score (Arnett DK, et al., 2019) is: 6%  Health Maintenance Due  Topic Date Due   Medicare Annual Wellness (AWV)  Never done   COVID-19 Vaccine (1) Never done   Zoster Vaccines- Shingrix (1 of 2)  Never done   Pneumococcal Vaccine: 50+  Years (1 of 1 - PCV) Never done   DTaP/Tdap/Td (2 - Td or Tdap) 01/06/2021   MAMMOGRAM  04/19/2022   Influenza Vaccine  Never done      Objective:     BP 135/70   Pulse 69   Ht 5' 6 (1.676 m)   Wt 177 lb 1.9 oz (80.3 kg)   SpO2 95%   BMI 28.59 kg/m  {Vitals History (Optional):23777}  Physical Exam   No results found for any visits on 09/16/23.      Assessment & Plan:   There are no diagnoses linked to this encounter.   No follow-ups on file.    Toribio MARLA Slain, MD

## 2023-09-17 ENCOUNTER — Other Ambulatory Visit

## 2023-09-17 DIAGNOSIS — R5383 Other fatigue: Secondary | ICD-10-CM

## 2023-09-17 DIAGNOSIS — E7800 Pure hypercholesterolemia, unspecified: Secondary | ICD-10-CM | POA: Diagnosis not present

## 2023-09-17 DIAGNOSIS — R790 Abnormal level of blood mineral: Secondary | ICD-10-CM | POA: Diagnosis not present

## 2023-09-18 ENCOUNTER — Ambulatory Visit: Payer: Self-pay | Admitting: Family Medicine

## 2023-09-18 ENCOUNTER — Encounter: Payer: Self-pay | Admitting: Family Medicine

## 2023-09-18 LAB — LIPID PANEL
Chol/HDL Ratio: 2.4 ratio (ref 0.0–4.4)
Cholesterol, Total: 224 mg/dL — ABNORMAL HIGH (ref 100–199)
HDL: 93 mg/dL (ref >39)
LDL Chol Calc (NIH): 115 mg/dL — ABNORMAL HIGH (ref 0–99)
Triglycerides: 92 mg/dL (ref 0–149)
VLDL Cholesterol Cal: 16 mg/dL (ref 5–40)

## 2023-09-18 LAB — IRON,TIBC AND FERRITIN PANEL
Ferritin: 125 ng/mL (ref 15–150)
Iron Saturation: 57 % — ABNORMAL HIGH (ref 15–55)
Iron: 192 ug/dL — ABNORMAL HIGH (ref 27–139)
Total Iron Binding Capacity: 337 ug/dL (ref 250–450)
UIBC: 145 ug/dL (ref 118–369)

## 2023-09-18 LAB — CORTISOL: Cortisol: 19.2 ug/dL (ref 6.2–19.4)

## 2023-09-18 LAB — URINE CULTURE

## 2023-09-19 NOTE — Assessment & Plan Note (Signed)
-   History of inflammatory arthritis and severe osteoarthritis. Recently stopped meloxicam  due to intolerable GI side effects, which precipitated a severe pain flare. The flare was managed with a short course of prednisone  prescribed by her rheumatologist. Continues to have significant arthritic pain. - Continue management with rheumatology. - Doxepin  may offer minimal adjunctive pain relief.

## 2023-09-19 NOTE — Assessment & Plan Note (Signed)
-   Reports significant difficulty with sleep onset and maintenance, exacerbated by recent life stressors and uncontrolled arthritis pain. Failed trials of trazodone  and amitriptyline . - Start doxepin  6mg  at bedtime for sleep. Counseled this may also provide a small benefit for pain and GI symptoms. Advised that the dose is fixed for sleep and doesn't need to be titrated - Will check an 8 AM cortisol level and iron panel via lab visit. Patient to be fasting. - Schedule a follow-up video visit in one month to assess sleep and medication efficacy.

## 2023-09-19 NOTE — Assessment & Plan Note (Signed)
-   History of recurrent UTIs, left-sided double collecting system, and reflux. Presents with symptoms suggestive of a UTI for 4 days. Last UTI in June 2024 was sensitive to most antibiotics except nitrofurantoin  and responded well to Keflex . - Check urinalysis today. If consistent with UTI, will send prescription for Keflex . - Send urine for culture and sensitivity to guide therapy if needed.

## 2023-09-21 ENCOUNTER — Ambulatory Visit: Admitting: Family Medicine

## 2023-09-29 ENCOUNTER — Other Ambulatory Visit: Payer: Self-pay | Admitting: Family Medicine

## 2023-09-29 DIAGNOSIS — R7989 Other specified abnormal findings of blood chemistry: Secondary | ICD-10-CM

## 2023-09-30 DIAGNOSIS — R790 Abnormal level of blood mineral: Secondary | ICD-10-CM | POA: Diagnosis not present

## 2023-10-09 ENCOUNTER — Ambulatory Visit: Payer: Medicare HMO | Admitting: Internal Medicine

## 2023-10-14 DIAGNOSIS — R197 Diarrhea, unspecified: Secondary | ICD-10-CM | POA: Diagnosis not present

## 2023-10-14 DIAGNOSIS — K760 Fatty (change of) liver, not elsewhere classified: Secondary | ICD-10-CM | POA: Diagnosis not present

## 2023-10-14 DIAGNOSIS — Z148 Genetic carrier of other disease: Secondary | ICD-10-CM | POA: Diagnosis not present

## 2023-10-16 ENCOUNTER — Other Ambulatory Visit

## 2023-10-16 ENCOUNTER — Ambulatory Visit: Admitting: Internal Medicine

## 2023-10-16 ENCOUNTER — Encounter: Payer: Self-pay | Admitting: Internal Medicine

## 2023-10-16 VITALS — BP 124/70 | HR 86 | Ht 66.0 in | Wt 181.0 lb

## 2023-10-16 DIAGNOSIS — E039 Hypothyroidism, unspecified: Secondary | ICD-10-CM

## 2023-10-16 LAB — T4, FREE: Free T4: 1.2 ng/dL (ref 0.8–1.8)

## 2023-10-16 LAB — TSH: TSH: 1.1 m[IU]/L (ref 0.40–4.50)

## 2023-10-16 NOTE — Patient Instructions (Signed)
Please stop at the lab.  Please continue Synthroid 100 mcg daily.  Take the thyroid hormone every day, with water, at least 30 minutes before breakfast, separated by at least 4 hours from: - acid reflux medications - calcium - iron - multivitamins  Please come back for a follow-up appointment in 1 year.  

## 2023-10-16 NOTE — Progress Notes (Addendum)
 Patient ID: Marissa Larson, female   DOB: November 21, 1955, 68 y.o.   MRN: [id number]   HPI  Marissa Larson is a 68 y.o.-year-old female, initially referred by her PCP, Dr. Midge, returning for follow-up for hypothyroidism and prediabetes.  She previously saw Dr. Mirna from 2017-2019.   Latest visit with me 1 year ago.  Her husband is also my patient.  Interim history: She has significant insomnia and fatigue.  She has a history of insomnia and was taking sleep medicines before, but stopped.  She had a cortisol checked 09/17/2023 and this was normal, at 19.2. He had COVID19 infection last month.  Hypothyroidism  -Diagnosed at 48-21 y/o, when she presented with several symptoms including fatigue   She is on Synthroid  DAW 100 mcg daily, latest dose change 04/2021: - in am - fasting - at least 30 min from b'fast - no calcium - no iron - no multivitamins - no PPIs, but takes Pepcid- at night - prn - not on Biotin  Reviewed her TFTs: Lab Results  Component Value Date   TSH 0.487 06/19/2023   TSH 1.42 10/09/2022   TSH 1.290 07/01/2022   TSH 0.469 10/31/2021   TSH 1.83 05/20/2021   TSH 0.28 (L) 04/08/2021   TSH 0.421 (L) 12/11/2020   TSH 0.27 (L) 09/14/2020   TSH 0.192 (L) 08/08/2020   TSH 0.44 03/09/2020   FREET4 1.46 06/19/2023   FREET4 0.87 10/09/2022   FREET4 0.93 05/20/2021   FREET4 1.15 04/08/2021   FREET4 1.61 12/11/2020   FREET4 0.94 09/14/2020   FREET4 1.55 08/08/2020   FREET4 1.10 03/09/2020   FREET4 1.16 05/19/2019   FREET4 1.37 06/17/2018   T3FREE 3.1 06/17/2018   T3FREE 2.9 06/10/2017   T3FREE 3.2 10/10/2015   Per Dr. Altheimer's records: She was followed by an endocrinologist in [place]  in about [id number] prior to relocating here. TSH 1.680 on 09/29/13 on levothyroxine 112 mcg daily. TSH 1.450 on 03/23/14 on levothyroxine 112 mcg daily. TSH 1.450 on 07/21/14 on levothyroxine 112 mcg daily. Thyroid  peroxidase (anti-TPO) antibody negative and thyroglobulin  antibody negative in 3/17. FT4 1.59 (0.82-1.77), TSH 0.338 (0.450-4.500) in 3/17 on levothyroxine 112 mcg daily. FT4 1.55 (0.82-1.77), TSH 0.753 (0.450-4.500) in 4/17 on levothyroxine 112 mcg daily. FT4 1.45 (0.82-1.77), TSH 0.846 (0.450-4.500) in 10/17 on levothyroxine 112 mcg daily. FT4 1.0 (0.6-1.4), TSH 1.492 (0.450-5.330) in 6/18 on levothyroxine 112 mcg daily. FT4 1.1 (0.6-1.4), TSH 0.605 (0.450-5.330) in 2/19 on levothyroxine 112 mcg daily. FT4 0.9 (0.6-1.4), TSH 0.486 (0.450-5.330) in 10/19 on levothyroxine 112 mcg daily.  Pt denies: - feeling nodules in neck - hoarseness - dysphagia - choking  She has + FH of thyroid  disorders in: Mother (thyroidectomy for goiter) and sister, M aunt (thyroidectomy for goiter).  No family history of thyroid  cancer.  No history of radiation therapy to head or neck.  No recent use of iodine supplements.  Not on biotin. She was on po steroid for 1 week few mo ago.  Mild prediabetes:  Reviewed HbA1c levels: Lab Results  Component Value Date   HGBA1C 5.3 06/19/2023   HGBA1C 5.6 07/01/2022   HGBA1C 5.7 (H) 10/31/2021   HGBA1C 5.9 04/08/2021   HGBA1C 5.9 (H) 12/11/2020   HGBA1C 5.7 (H) 08/08/2020   HGBA1C 5.5 08/17/2019   HGBA1C 5.5 05/19/2019   HGBA1C 5.7 (H) 06/17/2018   HGBA1C 5.7 (H) 06/10/2017   HGBA1C 5.5 12/08/2016   HGBA1C 5.5 10/10/2015  03/09/2020: HbA1c 5.8%  She is not on any medications  for her prediabetes. She is not checking sugars. She is on a mostly plant-based diet (Dr. Tobin), like her husband.   No CKD: Lab Results  Component Value Date   BUN 17 06/19/2023   BUN 20 06/12/2022   Lab Results  Component Value Date   CREATININE 0.59 06/19/2023   CREATININE 0.68 06/12/2022   + HL; latest lipids: Lab Results  Component Value Date   CHOL 224 (H) 09/17/2023   HDL 93 09/17/2023   LDLCALC 115 (H) 09/17/2023   TRIG 92 09/17/2023   CHOLHDL 2.4 09/17/2023  On omega-3 fatty acids and flaxseed oil.  Last eye  exam: 04/2019 - No DR.   She has family history of diabetes in brother.  She has fatty liver - lost weight (20 lbs) prev., but gained some back. She sees Dr. Dolphus for OA and pseudogout >> on Plaquenil  and Colchicine  >> off. She was on a Polyphenol supplement. She has a history of kidney stones. She had R TKR in 11/2019 >> slow recovery >> then hurt her back >> on pain medicines, steroids.  She had diarrhea in the past. She had extensive investigation for this >> believed to have been 2/2 Meloxicam . She also saw  integrative medicine. CT abd. scan from 05/27/2021 showed:  In 2023, she saw cardiology for palpitations. She had PVC, also has a 2nd degree AVB.  No intervention was needed.  ROS: + see HPI  I reviewed pt's medications, allergies, PMH, social hx, family hx, and changes were documented in the history of present illness. Otherwise, unchanged from my initial visit note.  Past Medical History:  Diagnosis Date   Arthritis    Chest pain 06/2013   Left sided   Chronic leg pain 06/2013   Complication of anesthesia    Dysrhythmia    Fatigue    GERD (gastroesophageal reflux disease)    History of kidney stones    Hypertension    Hypothyroidism    Kidney stones 09/12/2016   Nonproductive cough 06/2013   Overweight 01/18/2014   Peripheral neuropathy    PONV (postoperative nausea and vomiting)    Sleep difficulties    Change in sleep patterns   SOB (shortness of breath) 06/2013   Tachycardia 06/2013   Thyroid  disease    hypo   UTI (lower urinary tract infection)    Vesico-ureteric reflux    Past Surgical History:  Procedure Laterality Date   ABDOMINAL SURGERY  1994   CYSTOSCOPY/URETEROSCOPY/HOLMIUM LASER/STENT PLACEMENT Left 09/22/2017   Procedure: CYSTOSCOPY/RETROGRADE/URETEROSCOPY/ BASKET STONE EXTRACTION/STENT PLACEMENT;  Surgeon: Devere Lonni Righter, MD;  Location: [facility];  Service: Urology;  Laterality: Left;  ONLY NEEDS 60 MIN   FOOT SURGERY Right 2009    KIDNEY SURGERY Left 1993   KNEE ARTHROPLASTY Right 11/02/2019   KNEE SURGERY     Multiple knee surgeries   OOPHORECTOMY Left 1994   REPLACEMENT TOTAL KNEE Left 10/13/2018   Dr. Jane   small fiber neuropathy biopsy  12/2017   TONSILLECTOMY     URETEROSCOPY VIA URETEROSTOMY Left 1994   Social History   Socioeconomic History   Marital status: Married    Spouse name: Not on file   Number of children: 0   Years of education: Not on file   Highest education level: Not on file  Occupational History   Occupation: Business and [employer]  Tobacco Use   Smoking status: Never    Passive exposure: Past   Smokeless tobacco: Never  Vaping Use   Vaping  status: Never Used  Substance and Sexual Activity   Alcohol use: Yes    Comment: occ   Drug use: No   Sexual activity: Yes    Birth control/protection: None  Other Topics Concern   Not on file  Social History Narrative   Not on file   Social Drivers of Health   Financial Resource Strain: Not on file  Food Insecurity: Low Risk  (08/11/2023)   Received from [facility]   Hunger Vital Sign    Within the past 12 months, you worried that your food would run out before you got money to buy more: Never true    Within the past 12 months, the food you bought just didn't last and you didn't have money to get more. : Never true  Transportation Needs: No Transportation Needs (08/11/2023)   Received from [facility]    In the past 12 months, has lack of reliable transportation kept you from medical appointments, meetings, work or from getting things needed for daily living? : No  Physical Activity: Not on file  Stress: Not on file  Social Connections: Not on file  Intimate Partner Violence: Not on file   Current Outpatient Medications on File Prior to Visit  Medication Sig Dispense Refill   CREAM BASE EX Apply 1 application topically daily. BIEST Transdermal Cream 0.5 mg: Compounded bio-identical Estriol (E3)  combined by [facility]     Doxepin  HCl 6 MG TABS Take 1 tablet (6 mg total) by mouth at bedtime. 30 tablet 2   escitalopram  (LEXAPRO ) 20 MG tablet TAKE 1 TABLET BY MOUTH DAILY. 90 tablet 0   estradiol  (ESTRACE ) 0.1 MG/GM vaginal cream Place 1 g vaginally.     famotidine (PEPCID) 40 MG tablet Take 40 mg by mouth daily as needed (for heartburn/indigestion.).  2   Flaxseed, Linseed, (FLAX SEED OIL) 1000 MG CAPS Take 2,000 mg by mouth every evening.     Magnesium 500 MG CAPS Take 1,000 mg by mouth every evening.     NONFORMULARY OR COMPOUNDED ITEM Take 150 mg by mouth at bedtime. Bio identical Progesterone  150 ([facility])     Omega-3 Fatty Acids (FISH OIL) 1200 MG CAPS Take 2,400 mg by mouth every evening.     sucralfate (CARAFATE) 1 g tablet Take 1 g by mouth 4 (four) times daily. (Patient taking differently: Take 1 g by mouth as needed.)     SYNTHROID  100 MCG tablet TAKE 1 TABLET (100 MCG TOTAL) DAILY BEFORE BREAKFAST. 90 tablet 1   TURMERIC PO Take by mouth daily.     Vitamin D , Ergocalciferol , (DRISDOL ) 1.25 MG (50000 UNIT) CAPS capsule TAKE 1 CAPSULE (50,000 UNITS TOTAL) BY MOUTH EVERY 7 DAYS. 4 capsule 4   No current facility-administered medications on file prior to visit.   Allergies  Allergen Reactions   Penicillins Hives and Itching    Has patient had a PCN reaction causing immediate rash, facial/tongue/throat swelling, SOB or lightheadedness with hypotension: No Has patient had a PCN reaction causing severe rash involving mucus membranes or skin necrosis: No Has patient had a PCN reaction that required hospitalization: No Has patient had a PCN reaction occurring within the last 10 years: No If all of the above answers are NO, then may proceed with Cephalosporin use.    Sulfa Antibiotics Hives and Itching    Bactrim   Family History  Problem Relation Age of Onset   Heart disease Mother 68   Hyperlipidemia  Mother 21   Hypertension Mother 3   Stroke Mother  20   Cancer Father 47       pancreatic   Rheum arthritis Sister    Cancer Sister        Lung   Diabetes Brother 50   Heart disease Maternal Grandmother    Heart attack Maternal Uncle    Heart attack Paternal Aunt    Heart attack Cousin        Multiple cousins with MI's before 49 years of age   Migraines Other        Fam Hx    Heart disease Other        Fam Hx   Thyroid  disease Other        Fam Hx - She also has thyroid  disease   Diabetes Other        Fam Hx   Arthritis Other        Fam Hx   Gallbladder disease Other        Fam Hx   Cancer Other        Fam Hx - Pancreatic   Hypertension Other        Fam Hx   PE: BP 124/70   Pulse 86   Ht 5' 6 (1.676 m)   Wt 181 lb (82.1 kg)   SpO2 97%   BMI 29.21 kg/m  Wt Readings from Last 10 Encounters:  10/16/23 181 lb (82.1 kg)  09/16/23 177 lb 1.9 oz (80.3 kg)  08/06/23 175 lb 9.6 oz (79.7 kg)  06/19/23 174 lb 12 oz (79.3 kg)  10/09/22 171 lb 3.2 oz (77.7 kg)  09/12/22 172 lb (78 kg)  08/19/22 175 lb (79.4 kg)  08/18/22 173 lb (78.5 kg)  08/13/22 173 lb (78.5 kg)  06/24/22 177 lb 1.9 oz (80.3 kg)   Constitutional: overweight, in NAD Eyes:  EOMI, no exophthalmos ENT: no thyromegaly, no cervical lymphadenopathy Cardiovascular: RRR, No MRG Respiratory: CTA B Musculoskeletal: no deformities  Skin:  no rashes Neurological: no tremor with outstretched hands   ASSESSMENT: 1.  Acquired hypothyroidism -No clear evidence of Hashimoto's thyroiditis  2.  Mild prediabetes - Managed by PCP - Latest HbA1c was 5.3%, improved, 06/19/2023 - on Gundry diet  PLAN:  1. Patient with longstanding hypothyroidism, on Synthroid  d.a.w. - latest thyroid  labs reviewed with pt. >> normal: Lab Results  Component Value Date   TSH 0.487 06/19/2023  - she continues on Synthroid  d.a.w. 100 mcg daily - pt feels good on this dose.  She previously had significant weight loss before our visit in 2023 while on Gundry diet.  She gained  approximately 10 pounds back since then.  She does mention that she relaxed her diet slightly.  She also mentions significant insomnia and fatigue.  These are not new, and they exacerbated after she stopped her sleep medicines. - At today's visit, we discussed about possibly reducing the dose of her Synthroid  if the TSH is still low in the normal range to see if this would help with her sleep.  She agrees with this. - we discussed about taking the thyroid  hormone every day, with water, >30 minutes before breakfast, separated by >4 hours from acid reflux medications, calcium, iron, multivitamins. Pt. is taking it correctly. - will check thyroid  tests today: TSH and fT4 - If labs are abnormal, she will need to return for repeat TFTs in 1.5 months - OTW, I will see her back in a year  Refills per  [facility]. 90 days supply - even if changing the dose.  Component     Latest Ref Rng 10/16/2023  TSH     0.40 - 4.50 mIU/L 1.10   T4,Free(Direct)     0.8 - 1.8 ng/dL 1.2   Normal TFTs.  Requested Prescriptions   Signed Prescriptions Disp Refills   SYNTHROID  100 MCG tablet 90 tablet 3    Sig: Take 1 tablet (100 mcg total) by mouth daily before breakfast.   Lela Fendt, MD PhD [facility]

## 2023-10-19 ENCOUNTER — Ambulatory Visit: Payer: Self-pay | Admitting: Internal Medicine

## 2023-10-19 MED ORDER — SYNTHROID 100 MCG PO TABS
100.0000 ug | ORAL_TABLET | Freq: Every day | ORAL | 3 refills | Status: AC
Start: 2023-10-19 — End: ?

## 2023-10-19 NOTE — Addendum Note (Signed)
 Addended by: TRIXIE FILE on: 10/19/2023 01:02 PM   Modules accepted: Orders

## 2023-10-25 ENCOUNTER — Encounter: Payer: Self-pay | Admitting: Family Medicine

## 2023-10-27 ENCOUNTER — Other Ambulatory Visit

## 2023-10-29 ENCOUNTER — Ambulatory Visit: Admitting: Internal Medicine

## 2023-11-04 DIAGNOSIS — Z01419 Encounter for gynecological examination (general) (routine) without abnormal findings: Secondary | ICD-10-CM | POA: Diagnosis not present

## 2023-11-04 DIAGNOSIS — N390 Urinary tract infection, site not specified: Secondary | ICD-10-CM | POA: Diagnosis not present

## 2023-11-04 DIAGNOSIS — R319 Hematuria, unspecified: Secondary | ICD-10-CM | POA: Diagnosis not present

## 2023-11-04 DIAGNOSIS — Z6832 Body mass index (BMI) 32.0-32.9, adult: Secondary | ICD-10-CM | POA: Diagnosis not present

## 2023-11-19 ENCOUNTER — Telehealth: Admitting: Family Medicine

## 2023-12-09 ENCOUNTER — Other Ambulatory Visit: Payer: Self-pay | Admitting: Family Medicine

## 2023-12-09 DIAGNOSIS — F4323 Adjustment disorder with mixed anxiety and depressed mood: Secondary | ICD-10-CM

## 2023-12-22 ENCOUNTER — Other Ambulatory Visit: Payer: Self-pay

## 2023-12-22 DIAGNOSIS — E559 Vitamin D deficiency, unspecified: Secondary | ICD-10-CM

## 2023-12-29 ENCOUNTER — Other Ambulatory Visit: Payer: Self-pay | Admitting: Family Medicine

## 2024-01-26 NOTE — Progress Notes (Unsigned)
 "  Office Visit Note  Patient: Marissa Larson             Date of Birth: 1955/02/15           MRN: [id number]             PCP: Chandra Toribio POUR, MD Referring: Chandra Toribio POUR, MD Visit Date: 02/09/2024 Occupation: Data Unavailable  Subjective:  No chief complaint on file.   History of Present Illness: Marissa Larson is a 69 y.o. female ***     Activities of Daily Living:  Patient reports morning stiffness for *** {minute/hour:19697}.   Patient {ACTIONS;DENIES/REPORTS:21021675::Denies} nocturnal pain.  Difficulty dressing/grooming: {ACTIONS;DENIES/REPORTS:21021675::Denies} Difficulty climbing stairs: {ACTIONS;DENIES/REPORTS:21021675::Denies} Difficulty getting out of chair: {ACTIONS;DENIES/REPORTS:21021675::Denies} Difficulty using hands for taps, buttons, cutlery, and/or writing: {ACTIONS;DENIES/REPORTS:21021675::Denies}  No Rheumatology ROS completed.   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   Obesity, Class I, BMI 30-34.9 12/03/2016   Nausea 09/12/2016   High risk medication use 07/30/2016   NAFLD (nonalcoholic fatty liver disease) 92/74/7981   Primary osteoarthritis of both feet 04/12/2016   History of kidney stones 04/12/2016   Primary osteoarthritis of both hands 04/11/2016   Bilateral primary osteoarthritis of hip 04/11/2016   Primary osteoarthritis of both knees 04/11/2016   Spondylosis of lumbar region without myelopathy or radiculopathy 04/11/2016    Hyperuricemia 04/11/2016   Primary insomnia 04/11/2016   Flank pain 04/02/2016   Elevated HDL 11/11/2015   Elevated LFTs 11/11/2015   Adjustment disorder with mixed anxiety and depressed mood 11/11/2015   Dysuria 11/11/2015   Hypothyroidism 10/14/2015   Pseudogout involving multiple joints 10/14/2015   Vitamin D  deficiency 10/14/2015   Hormone replacement therapy- per GYN 10/14/2015   HTN (hypertension) 10/14/2015   Fatigue 10/14/2015   h/o Hiatal hernia 10/14/2015   Sleep difficulties 10/14/2015   Generalized OA 10/14/2015   Counseling on health promotion and disease prevention 10/14/2015   Gastric ulcer- due to Mobic  10/09/2015   Age-related nuclear cataract of both eyes 01/25/2015   Posterior vitreous detachment of left eye 01/25/2015   chronic Palpitations 07/19/2013    Past Medical History:  Diagnosis Date   Arthritis    Chest pain 06/2013   Left sided   Chronic leg pain 06/2013   Complication of anesthesia    Dysrhythmia    Fatigue    GERD (gastroesophageal reflux disease)    History of kidney stones    Hypertension    Hypothyroidism    Kidney stones 09/12/2016   Nonproductive cough 06/2013   Overweight 01/18/2014   Peripheral neuropathy    PONV (postoperative nausea and vomiting)    Sleep difficulties    Change in sleep patterns   SOB (shortness of breath) 06/2013   Tachycardia 06/2013   Thyroid  disease    hypo   UTI (lower urinary tract infection)    Vesico-ureteric reflux     Family History  Problem Relation Age of Onset   Heart disease Mother 43   Hyperlipidemia Mother 23   Hypertension Mother  40   Stroke Mother 74   Cancer Father 4       pancreatic   Rheum arthritis Sister    Cancer Sister        Lung   Diabetes Brother 50   Heart disease Maternal Grandmother    Heart attack Maternal Uncle    Heart attack Paternal Aunt    Heart attack Cousin        Multiple cousins with MI's before 35 years of age   Migraines Other        Fam Hx    Heart  disease Other        Fam Hx   Thyroid  disease Other        Fam Hx - She also has thyroid  disease   Diabetes Other        Fam Hx   Arthritis Other        Fam Hx   Gallbladder disease Other        Fam Hx   Cancer Other        Fam Hx - Pancreatic   Hypertension Other        Fam Hx   Past Surgical History:  Procedure Laterality Date   ABDOMINAL SURGERY  1994   CYSTOSCOPY/URETEROSCOPY/HOLMIUM LASER/STENT PLACEMENT Left 09/22/2017   Procedure: CYSTOSCOPY/RETROGRADE/URETEROSCOPY/ BASKET STONE EXTRACTION/STENT PLACEMENT;  Surgeon: Devere Lonni Righter, MD;  Location: [facility];  Service: Urology;  Laterality: Left;  ONLY NEEDS 60 MIN   FOOT SURGERY Right 2009   KIDNEY SURGERY Left 1993   KNEE ARTHROPLASTY Right 11/02/2019   KNEE SURGERY     Multiple knee surgeries   OOPHORECTOMY Left 1994   REPLACEMENT TOTAL KNEE Left 10/13/2018   Dr. Jane   small fiber neuropathy biopsy  12/2017   TONSILLECTOMY     URETEROSCOPY VIA URETEROSTOMY Left 1994   Social History[1] Social History   Social History Narrative   Not on file     Immunization History  Administered Date(s) Administered   Tdap 01/07/2011     Objective: Vital Signs: There were no vitals taken for this visit.   Physical Exam   Musculoskeletal Exam: ***  CDAI Exam: CDAI Score: -- Patient Global: --; Provider Global: -- Swollen: --; Tender: -- Joint Exam 02/09/2024   No joint exam has been documented for this visit   There is currently no information documented on the homunculus. Go to the Rheumatology activity and complete the homunculus joint exam.  Investigation: No additional findings.  Imaging: No results found.  Recent Labs: Lab Results  Component Value Date   WBC 7.5 06/19/2023   HGB 15.1 06/19/2023   PLT 242 06/19/2023   NA 143 06/19/2023   K 4.5 06/19/2023   CL 105 06/19/2023   CO2 22 06/19/2023   GLUCOSE 96 06/19/2023   BUN 17 06/19/2023   CREATININE 0.59 06/19/2023   BILITOT 0.5  06/19/2023   ALKPHOS 108 06/19/2023   AST 17 06/19/2023   ALT 15 06/19/2023   PROT 6.8 06/19/2023   ALBUMIN 4.5 06/19/2023   CALCIUM 9.6 06/19/2023   GFRAA 108 08/17/2019   QFTBGOLDPLUS NEGATIVE 02/16/2017    Speciality Comments: PLQ eye exam: 07/02/2022 normal Arlena Reusing - [facility] Follow up in 1 year.  Procedures:  No procedures performed Allergies: Penicillins and Sulfa antibiotics   Assessment / Plan:     Visit Diagnoses: No diagnosis found.  Orders: No orders of the defined types were placed in this encounter.  No orders of the defined  types were placed in this encounter.   Face-to-face time spent with patient was *** minutes. Greater than 50% of time was spent in counseling and coordination of care.  Follow-Up Instructions: No follow-ups on file.   Daved JAYSON Gavel, CMA  Note - This record has been created using Animal nutritionist.  Chart creation errors have been sought, but may not always  have been located. Such creation errors do not reflect on  the standard of medical care.    [1]  Social History Tobacco Use   Smoking status: Never    Passive exposure: Past   Smokeless tobacco: Never  Vaping Use   Vaping status: Never Used  Substance Use Topics   Alcohol use: Yes    Comment: occ   Drug use: No   "

## 2024-01-29 ENCOUNTER — Ambulatory Visit: Admitting: Podiatry

## 2024-01-29 ENCOUNTER — Ambulatory Visit

## 2024-01-29 DIAGNOSIS — S93602A Unspecified sprain of left foot, initial encounter: Secondary | ICD-10-CM

## 2024-01-29 DIAGNOSIS — M79672 Pain in left foot: Secondary | ICD-10-CM

## 2024-01-29 DIAGNOSIS — G8929 Other chronic pain: Secondary | ICD-10-CM

## 2024-01-29 DIAGNOSIS — M7672 Peroneal tendinitis, left leg: Secondary | ICD-10-CM

## 2024-01-29 MED ORDER — MELOXICAM 7.5 MG PO TABS: 7.5000 mg | ORAL_TABLET | Freq: Every day | ORAL | 0 refills | Status: AC

## 2024-01-29 NOTE — Progress Notes (Signed)
 Subjective:   Patient ID: Marissa Larson, female   DOB: 69 y.o.   MRN: [id number]   HPI Chief Complaint  Patient presents with   Ankle Pain    Patient presents today for ankle and foot pain    69 year old female presents the office with above concerns.  She states that she had an injury to the left ankle which occurred on Monday, January 25, 2024.  She states that she got her foot caught and she rolled it.  She states that her symptoms have continued and it hurts to walk.  She points more to the outside aspect where she has majority discomfort as well as to the heel.   Review of Systems  All other systems reviewed and are negative.       Objective:  Physical Exam  General: AAO x3, NAD  Dermatological: Bruising present to the posterior, plantar aspect of the heel without any ulcerations or any open lesions.  Vascular: Dorsalis Pedis artery and Posterior Tibial artery pedal pulses are 2/4 bilateral with immedate capillary fill time. There is no pain with calf compression, swelling, warmth, erythema.   Neruologic: Grossly intact via light touch bilateral.   Musculoskeletal: Unable to appreciate any area of pinpoint tenderness but there is no pain at the tibia, fibula.  There is no pain with lateral compression of the calcaneus.  The majority of tenderness is along the course of the peroneal tendons posterior to the lateral malleolus.  Minimal discomfort of the Achilles tendon.  Clinically the Achilles tendon appears to be intact.  There is no pain to the forefoot.       Assessment:   Left ankle injury, peroneal tendon injury     Plan:  -Treatment options discussed including all alternatives, risks, and complications -Etiology of symptoms were discussed -X-rays were obtained and reviewed with the patient.  X-rays were obtained reviewed.  Multiple views of the foot, ankle were obtained.  There is no evidence of acute fracture.  Likely more chronic calcification of the posterior  calcaneus.  Metatarsus adductus present. -Given her symptoms recommend mobilization in the cam boot today which is dispensed to help facilitate healing. -Prescribed mobic . Discussed side effects of the medication and directed to stop if any are to occur and call the office.  - Icing daily - If symptoms persist consider repeat x-ray and/or MRI    Return for ankle and foot pain in 2-3 weeks.  Donnice JONELLE Fees DPM

## 2024-01-29 NOTE — Patient Instructions (Signed)
 When You Have a Walking Boot: Self-Care for Adults  A walking boot holds your foot or ankle in place after an injury or procedure. It helps with healing and stops your foot or ankle from getting hurt more. The boot has a hard, rigid outer frame. Its inner lining is a layer of padded material. The boots also have straps that you can adjust to secure them over your foot and leg. You may be given a walking boot if you can stand or walk on your injured foot. Ask how much you can walk while wearing the boot. How to put on your walking boot There are a few types of walking boots. Follow the steps for how to put on your certain type of boot. You may need to: Ask someone to help you put on the boot. Sit to put on the boot. Doing this is more comfortable. It also helps to prevent falls. Open up the boot fully. Put your foot in the boot so your heel rests against the back. Make sure your toes are supported by the base of the boot. They shouldn't hang over the front edge. Adjust the straps. The boot should feel secure but not too tight. Keep the boot straight. Do not bend the hard frame of the boot to get a good fit. How to walk with your walking boot How much you can walk will depend on your injury. Do not try to walk without wearing the boot until your health care provider says it's OK. While wearing the boot, you may need to: Use crutches or a cane as told. Wear a shoe with a heel on your other foot. This can help raise it to the height of the walking boot. Be careful when walking on surfaces that are uneven or wet. How to reduce swelling while using your walking boot  Rest your injured foot or leg as much as you can. Use ice or an ice pack as told. Take off your walking boot. Place a towel between your skin and the ice or between your cast and the ice. Leave the ice on for 20 minutes, 2-3 times a day. If your skin turns red, take off the ice right away to prevent skin damage. The risk of damage is  higher if you can't feel pain, heat, or cold. Move your toes often to reduce stiffness and swelling. Raise your foot or leg above the level of your heart while you're sitting down. Try to do this at least 2?3 hours each day. Use pillows as needed. If the swelling gets worse, loosen the boot. Rest your foot and leg. How to care for your skin and foot while using your walking boot Wear a long sock to stop your foot and leg from rubbing inside the boot. Take off the boot once a day to check the injured area. Check for sores, rashes, swelling, or wounds. The skin should be a healthy color. It shouldn't be pale or blue. Try to watch your walking pattern (gait) in the boot. Make sure it's fairly normal and that you don't walk with a clear limp. Take care of any wound or cut from surgery as told. Clean and wash the injured area as told. Gently dry your foot and leg before putting the boot back on. How to take off your walking boot Take off the walking boot as told by your provider. In most cases, it's OK to take off the boot: When you're resting or sleeping. To clean your foot  and leg. How to keep your walking boot clean Do not put any part of the boot in a washing machine or dryer. Do not use chemical cleaning products. These may irritate your skin. Do not soak the liner of the boot. Use a washcloth with mild soap and water to clean the frame and liner of the boot by hand. Let the boot dry fully before you put it back on your foot. Follow these instructions at home: Activity Ask what things are safe for you to do. Bathe and shower as told by your provider. Do not do things that could make your injury worse. Ask when it's safe to drive. Contact a health care provider if: The boot is cracked or damaged. The boot doesn't fit right. Your foot or leg hurts. You have a rash, sore, or open sore on your foot or leg. The skin on your foot or leg is pale. You have a wound or cut on your foot that's  getting worse. Your skin is painful, red, or irritated. Your swelling doesn't get better, or it gets worse. Get help right away if: Your foot or leg turns numb. The skin on your foot or leg is: Cold. Blue. Marissa Larson. This information is not intended to replace advice given to you by your health care provider. Make sure you discuss any questions you have with your health care provider. Document Revised: 06/27/2022 Document Reviewed: 06/27/2022 Elsevier Patient Education  2024 [employer].

## 2024-02-09 ENCOUNTER — Ambulatory Visit: Admitting: Rheumatology

## 2024-02-09 DIAGNOSIS — M1189 Other specified crystal arthropathies, multiple sites: Secondary | ICD-10-CM

## 2024-02-09 DIAGNOSIS — M160 Bilateral primary osteoarthritis of hip: Secondary | ICD-10-CM

## 2024-02-09 DIAGNOSIS — M19071 Primary osteoarthritis, right ankle and foot: Secondary | ICD-10-CM

## 2024-02-09 DIAGNOSIS — M8589 Other specified disorders of bone density and structure, multiple sites: Secondary | ICD-10-CM

## 2024-02-09 DIAGNOSIS — Z87442 Personal history of urinary calculi: Secondary | ICD-10-CM

## 2024-02-09 DIAGNOSIS — M1380 Other specified arthritis, unspecified site: Secondary | ICD-10-CM

## 2024-02-09 DIAGNOSIS — E790 Hyperuricemia without signs of inflammatory arthritis and tophaceous disease: Secondary | ICD-10-CM

## 2024-02-09 DIAGNOSIS — M47816 Spondylosis without myelopathy or radiculopathy, lumbar region: Secondary | ICD-10-CM

## 2024-02-09 DIAGNOSIS — Z96651 Presence of right artificial knee joint: Secondary | ICD-10-CM

## 2024-02-09 DIAGNOSIS — K449 Diaphragmatic hernia without obstruction or gangrene: Secondary | ICD-10-CM

## 2024-02-09 DIAGNOSIS — F5101 Primary insomnia: Secondary | ICD-10-CM

## 2024-02-09 DIAGNOSIS — K760 Fatty (change of) liver, not elsewhere classified: Secondary | ICD-10-CM

## 2024-02-09 DIAGNOSIS — Z96652 Presence of left artificial knee joint: Secondary | ICD-10-CM

## 2024-02-09 DIAGNOSIS — G629 Polyneuropathy, unspecified: Secondary | ICD-10-CM

## 2024-02-09 DIAGNOSIS — M19041 Primary osteoarthritis, right hand: Secondary | ICD-10-CM

## 2024-02-09 DIAGNOSIS — E559 Vitamin D deficiency, unspecified: Secondary | ICD-10-CM

## 2024-02-09 DIAGNOSIS — I10 Essential (primary) hypertension: Secondary | ICD-10-CM

## 2024-02-16 ENCOUNTER — Ambulatory Visit: Admitting: Podiatry

## 2024-03-10 ENCOUNTER — Ambulatory Visit

## 2024-03-31 ENCOUNTER — Ambulatory Visit: Admitting: Physician Assistant

## 2024-10-18 ENCOUNTER — Ambulatory Visit: Admitting: Internal Medicine
